# Patient Record
Sex: Male | Born: 1940 | Race: White | Hispanic: No | Marital: Married | Smoking: Former smoker
Health system: Southern US, Community
[De-identification: ages and names within clinical notes are randomized; demographics above are authoritative.]

## PROBLEM LIST (undated history)

## (undated) DIAGNOSIS — Z8719 Personal history of other diseases of the digestive system: Secondary | ICD-10-CM

## (undated) DIAGNOSIS — E039 Hypothyroidism, unspecified: Secondary | ICD-10-CM

## (undated) DIAGNOSIS — I2510 Atherosclerotic heart disease of native coronary artery without angina pectoris: Secondary | ICD-10-CM

## (undated) DIAGNOSIS — Z8739 Personal history of other diseases of the musculoskeletal system and connective tissue: Secondary | ICD-10-CM

## (undated) DIAGNOSIS — E119 Type 2 diabetes mellitus without complications: Secondary | ICD-10-CM

## (undated) HISTORY — PX: CATARACT EXTRACTION W/ INTRAOCULAR LENS  IMPLANT, BILATERAL: SHX1307

## (undated) HISTORY — PX: ANTERIOR CERVICAL DECOMP/DISCECTOMY FUSION: SHX1161

## (undated) HISTORY — PX: BACK SURGERY: SHX140

## (undated) HISTORY — DX: Type 2 diabetes mellitus without complications: E11.9

## (undated) HISTORY — PX: KNEE ARTHROSCOPY: SHX127

## (undated) HISTORY — PX: PATELLAR TENDON REPAIR: SHX737

## (undated) HISTORY — PX: TOOTH EXTRACTION: SUR596

## (undated) HISTORY — PX: HIATAL HERNIA REPAIR: SHX195

## (undated) HISTORY — PX: EXPLORATION POST OPERATIVE OPEN HEART: SHX5061

---

## 1994-08-04 HISTORY — PX: CARDIAC CATHETERIZATION: SHX172

## 1998-06-01 ENCOUNTER — Ambulatory Visit (HOSPITAL_COMMUNITY): Admission: RE | Admit: 1998-06-01 | Discharge: 1998-06-01 | Payer: Self-pay | Admitting: Gastroenterology

## 2004-08-04 DIAGNOSIS — I10 Essential (primary) hypertension: Secondary | ICD-10-CM | POA: Insufficient documentation

## 2004-08-04 DIAGNOSIS — M109 Gout, unspecified: Secondary | ICD-10-CM | POA: Insufficient documentation

## 2004-09-11 ENCOUNTER — Ambulatory Visit: Payer: Self-pay | Admitting: Unknown Physician Specialty

## 2006-12-15 DIAGNOSIS — E1169 Type 2 diabetes mellitus with other specified complication: Secondary | ICD-10-CM | POA: Insufficient documentation

## 2009-01-25 DIAGNOSIS — E1129 Type 2 diabetes mellitus with other diabetic kidney complication: Secondary | ICD-10-CM | POA: Insufficient documentation

## 2009-12-18 ENCOUNTER — Ambulatory Visit: Payer: Self-pay | Admitting: Unknown Physician Specialty

## 2013-03-04 LAB — CBC AND DIFFERENTIAL
HEMATOCRIT: 41 % (ref 41–53)
Hemoglobin: 14.2 g/dL (ref 13.5–17.5)
Platelets: 237 10*3/uL (ref 150–399)
WBC: 4.9 10*3/mL

## 2013-03-04 LAB — HEPATIC FUNCTION PANEL
ALT: 27 U/L (ref 10–40)
AST: 19 U/L (ref 14–40)

## 2013-10-25 LAB — LIPID PANEL
Cholesterol: 154 mg/dL (ref 0–200)
HDL: 28 mg/dL — AB (ref 35–70)
LDL CALC: 95 mg/dL
Triglycerides: 156 mg/dL (ref 40–160)

## 2014-03-10 LAB — BASIC METABOLIC PANEL
BUN: 16 mg/dL (ref 4–21)
Creatinine: 1.2 mg/dL (ref 0.6–1.3)
GLUCOSE: 155 mg/dL
Potassium: 4.7 mmol/L (ref 3.4–5.3)
Sodium: 137 mmol/L (ref 137–147)

## 2014-03-10 LAB — PSA: PSA: 0.5

## 2014-11-03 LAB — HM DIABETES EYE EXAM

## 2014-11-16 LAB — HEMOGLOBIN A1C: Hgb A1c MFr Bld: 9.5 % — AB (ref 4.0–6.0)

## 2015-01-08 ENCOUNTER — Encounter: Payer: Self-pay | Admitting: Family Medicine

## 2015-01-08 ENCOUNTER — Ambulatory Visit (INDEPENDENT_AMBULATORY_CARE_PROVIDER_SITE_OTHER): Payer: Medicare Other | Admitting: Family Medicine

## 2015-01-08 VITALS — BP 112/62 | HR 70 | Temp 97.6°F | Resp 16 | Wt 225.0 lb

## 2015-01-08 DIAGNOSIS — J069 Acute upper respiratory infection, unspecified: Secondary | ICD-10-CM

## 2015-01-08 MED ORDER — AMOXICILLIN-POT CLAVULANATE 875-125 MG PO TABS
1.0000 | ORAL_TABLET | Freq: Two times a day (BID) | ORAL | Status: DC
Start: 2015-01-08 — End: 2015-02-23

## 2015-01-08 NOTE — Progress Notes (Signed)
Subjective:     Patient ID: Stuart Henry, male   DOB: 1941/06/23, 74 y.o.   MRN: [id number]  Sinus Problem Pertinent negatives include no chills.   Chief Complaint  Patient presents with  . Sinus Problem    Sinus pain and pressure with productive cough x7days  States wife has been sick as well. Describes persistent clear sinus drainage with PND and accompanying cough. Has been using Zyrtec and Delsym for his sx.   Review of Systems  Constitutional: Negative for fever and chills.       Objective:   Physical Exam Ears: T.M's intact without inflammation Sinuses: non-tender Throat: no tonsillar enlargement or exudate Neck: no cervical adenopathy Lungs: clear    Assessment:     1. Upper respiratory infection  - amoxicillin-clavulanate (AUGMENTIN) 875-125 MG per tablet; Take 1 tablet by mouth 2 (two) times daily.  Dispense: 20 tablet; Refill: 0    Plan:        Start antibiotic if sinuses not improving or the next 2-3 days.

## 2015-01-08 NOTE — Patient Instructions (Signed)
Start Mucinex D If sinuses not improving over the next 2-3 days start Augmentin antibiotic provided

## 2015-02-07 ENCOUNTER — Other Ambulatory Visit: Payer: Self-pay | Admitting: Family Medicine

## 2015-02-09 ENCOUNTER — Ambulatory Visit: Payer: Medicare Other | Admitting: Anesthesiology

## 2015-02-09 ENCOUNTER — Ambulatory Visit
Admission: RE | Admit: 2015-02-09 | Discharge: 2015-02-09 | Disposition: A | Discharge status: Home / Self Care | Payer: Medicare Other | Source: Ambulatory Visit | Attending: Unknown Physician Specialty | Admitting: Unknown Physician Specialty

## 2015-02-09 ENCOUNTER — Encounter
Admission: RE | Disposition: A | Discharge status: Home / Self Care | Payer: Self-pay | Source: Ambulatory Visit | Attending: Unknown Physician Specialty

## 2015-02-09 ENCOUNTER — Encounter: Payer: Self-pay | Admitting: *Deleted

## 2015-02-09 DIAGNOSIS — I10 Essential (primary) hypertension: Secondary | ICD-10-CM | POA: Diagnosis not present

## 2015-02-09 DIAGNOSIS — Z86010 Personal history of colonic polyps: Secondary | ICD-10-CM | POA: Insufficient documentation

## 2015-02-09 DIAGNOSIS — Z87891 Personal history of nicotine dependence: Secondary | ICD-10-CM | POA: Insufficient documentation

## 2015-02-09 DIAGNOSIS — D123 Benign neoplasm of transverse colon: Secondary | ICD-10-CM | POA: Insufficient documentation

## 2015-02-09 DIAGNOSIS — D128 Benign neoplasm of rectum: Secondary | ICD-10-CM | POA: Insufficient documentation

## 2015-02-09 DIAGNOSIS — E119 Type 2 diabetes mellitus without complications: Secondary | ICD-10-CM | POA: Insufficient documentation

## 2015-02-09 DIAGNOSIS — Z79899 Other long term (current) drug therapy: Secondary | ICD-10-CM | POA: Diagnosis not present

## 2015-02-09 DIAGNOSIS — K640 First degree hemorrhoids: Secondary | ICD-10-CM | POA: Diagnosis not present

## 2015-02-09 HISTORY — PX: COLONOSCOPY WITH PROPOFOL: SHX5780

## 2015-02-09 LAB — GLUCOSE, CAPILLARY: GLUCOSE-CAPILLARY: 248 mg/dL — AB (ref 65–99)

## 2015-02-09 SURGERY — COLONOSCOPY WITH PROPOFOL
Anesthesia: Monitor Anesthesia Care

## 2015-02-09 MED ORDER — LIDOCAINE HCL (PF) 2 % IJ SOLN
INTRAMUSCULAR | Status: DC | PRN
  Administered 2015-02-09: 50 mg

## 2015-02-09 MED ORDER — PROPOFOL 10 MG/ML IV BOLUS
INTRAVENOUS | Status: DC | PRN
  Administered 2015-02-09: 50 mg via INTRAVENOUS

## 2015-02-09 MED ORDER — SODIUM CHLORIDE 0.9 % IV SOLN
INTRAVENOUS | Status: DC
  Administered 2015-02-09: 1000 mL via INTRAVENOUS

## 2015-02-09 MED ORDER — PIPERACILLIN-TAZOBACTAM 3.375 G IVPB 30 MIN
3.3750 g | Freq: Once | INTRAVENOUS | Status: AC
  Administered 2015-02-09: 3.375 g via INTRAVENOUS
  Filled 2015-02-09: qty 50

## 2015-02-09 MED ORDER — PROPOFOL INFUSION 10 MG/ML OPTIME
INTRAVENOUS | Status: DC | PRN
  Administered 2015-02-09: 150 ug/kg/min via INTRAVENOUS

## 2015-02-09 MED ORDER — EPHEDRINE SULFATE 50 MG/ML IJ SOLN
INTRAMUSCULAR | Status: DC | PRN
  Administered 2015-02-09: 5 mg via INTRAVENOUS
  Administered 2015-02-09 (×2): 10 mg via INTRAVENOUS

## 2015-02-09 MED ORDER — FENTANYL CITRATE (PF) 100 MCG/2ML IJ SOLN
INTRAMUSCULAR | Status: DC | PRN
  Administered 2015-02-09: 50 ug via INTRAVENOUS

## 2015-02-09 MED ORDER — PHENYLEPHRINE HCL 10 MG/ML IJ SOLN
INTRAMUSCULAR | Status: DC | PRN
  Administered 2015-02-09 (×3): 100 ug via INTRAVENOUS

## 2015-02-09 MED ORDER — SODIUM CHLORIDE 0.9 % IV SOLN: INTRAVENOUS | Status: DC

## 2015-02-09 NOTE — Op Note (Signed)
[facility] Patient Name: Stuart Henry Procedure Date: 02/09/2015 7:36 AM MRN: [id number] Account #: [id number] Date of Birth: 04-06-1941 Admit Type: Outpatient Age: 74 Room: [facility] ENDO ROOM 1 Gender: Male Note Status: Finalized Procedure:         Colonoscopy Indications:       Personal history of colonic polyps Providers:         Manya Silvas, MD Referring MD:      Kirstie Peri. Caryn Section, MD (Referring MD) Medicines:         Propofol per Anesthesia Complications:     No immediate complications. Procedure:         Pre-Anesthesia Assessment:                    - After reviewing the risks and benefits, the patient was                     deemed in satisfactory condition to undergo the procedure.                    After obtaining informed consent, the colonoscope was                     passed under direct vision. Throughout the procedure, the                     patient's blood pressure, pulse, and oxygen saturations                     were monitored continuously. The Colonoscope was                     introduced through the anus and advanced to the the cecum,                     identified by appendiceal orifice and ileocecal valve. The                     colonoscopy was performed without difficulty. The patient                     tolerated the procedure well. The quality of the bowel                     preparation was good. Findings:      Two sessile polyps were found in the rectum and in the transverse colon.       The polyps were diminutive in size. These polyps were removed with a       jumbo cold forceps. Resection and retrieval were complete.      Internal hemorrhoids were found during endoscopy. The hemorrhoids were       medium-sized and Grade I (internal hemorrhoids that do not prolapse).      A few medium-mouthed diverticula were found in the descending colon.      The exam was otherwise without abnormality. Impression:        -  Two diminutive polyps in the rectum and in the                     transverse colon. Resected and retrieved.                    - Internal hemorrhoids.                    -  Diverticulosis in the descending colon.                    - The examination was otherwise normal. Recommendation:    - Await pathology results. Manya Silvas, MD 02/09/2015 8:33:11 AM This report has been signed electronically. Number of Addenda: 0 Note Initiated On: 02/09/2015 7:36 AM Scope Withdrawal Time: 0 hours 13 minutes 37 seconds  Total Procedure Duration: 0 hours 20 minutes 43 seconds       [facility]

## 2015-02-09 NOTE — Transfer of Care (Signed)
Immediate Anesthesia Transfer of Care Note  Patient: Stuart Henry  Procedure(s) Performed: Procedure(s): COLONOSCOPY WITH PROPOFOL (N/A)  Patient Location: PACU  Anesthesia Type:General  Level of Consciousness: sedated  Airway & Oxygen Therapy: Patient Spontanous Breathing and Patient connected to nasal cannula oxygen  Post-op Assessment: Report given to RN and Post -op Vital signs reviewed and stable  Post vital signs: Reviewed and stable  Last Vitals:  Filed Vitals:   02/09/15 0835  BP: 87/59  Pulse: 56  Temp: 36.2 C  Resp: 14    Complications: No apparent anesthesia complications

## 2015-02-09 NOTE — Anesthesia Preprocedure Evaluation (Addendum)
Anesthesia Evaluation  Patient identified by MRN, date of birth, ID band Patient awake    Reviewed: Allergy & Precautions, NPO status , Patient's Chart, lab work & pertinent test results  Airway Mallampati: II  TM Distance: >3 FB Neck ROM: Limited    Dental  (+) Teeth Intact   Pulmonary former smoker,    Pulmonary exam normal       Cardiovascular Exercise Tolerance: Good hypertension, Pt. 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  Reproductive/Obstetrics                            Anesthesia Physical Anesthesia Plan  ASA: III  Anesthesia Plan: MAC   Post-op Pain Management:    Induction: Intravenous  Airway Management Planned: Nasal Cannula  Additional Equipment:   Intra-op Plan:   Post-operative Plan:   Informed Consent: I have reviewed the patients History and Physical, chart, labs and discussed the procedure including the risks, benefits and alternatives for the proposed anesthesia with the patient or authorized representative who has indicated his/her understanding and acceptance.     Plan Discussed with: CRNA  Anesthesia Plan Comments:         Anesthesia Quick Evaluation

## 2015-02-09 NOTE — H&P (Signed)
Primary Care Physician:  [name], MD Primary Gastroenterologist:  Dr. Vira Agar  Pre-Procedure History & Physical: HPI:  Stuart Henry is a 74 y.o. male is here for an colonoscopy.   Past Medical History  Diagnosis Date  . Diabetes mellitus without complication   . Hypertension     No past surgical history on file.  Prior to Admission medications   Medication Sig Start Date End Date Taking? Authorizing Provider  allopurinol (ZYLOPRIM) 100 MG tablet Take 100 mg by mouth daily.   Yes Historical Provider, MD  benazepril-hydrochlorthiazide (LOTENSIN HCT) 20-12.5 MG per tablet Take 1 tablet by mouth daily.   Yes Historical Provider, MD  glipiZIDE (GLUCOTROL) 5 MG tablet Take 5 mg by mouth daily before breakfast.   Yes Historical Provider, MD  sitaGLIPtin-metformin (JANUMET) 50-1000 MG per tablet Take 1 tablet by mouth 2 (two) times daily with a meal.   Yes Historical Provider, MD  amLODipine (NORVASC) 10 MG tablet TAKE 1 TABLET DAILY 02/07/15   Birdie Sons, MD  amoxicillin-clavulanate (AUGMENTIN) 875-125 MG per tablet Take 1 tablet by mouth 2 (two) times daily. 01/08/15   Carmon Ginsberg, PA    Allergies as of 12/19/2014  . (Not on File)    No family history on file.  History   Social History  . Marital Status: Married    Spouse Name: N/A  . Number of Children: N/A  . Years of Education: N/A   Occupational History  . Not on file.   Social History Main Topics  . Smoking status: Former Smoker    Types: Cigarettes    Quit date: 08/06/1972  . Smokeless tobacco: Never Used  . Alcohol Use: Not on file  . Drug Use: Not on file  . Sexual Activity: Not on file   Other Topics Concern  . Not on file   Social History Narrative    Review of Systems: See HPI, otherwise negative ROS  Physical Exam: BP 114/73 mmHg  Pulse 61  Temp(Src) 96.6 F (35.9 C) (Tympanic)  Resp 16  Ht 6\' 4"  (1.93 m)  Wt 94.802 kg (209 lb)  BMI 25.45 kg/m2  SpO2 100% General:   Alert,   pleasant and cooperative in NAD Head:  Normocephalic and atraumatic. Neck:  Supple; no masses or thyromegaly. Lungs:  Clear throughout to auscultation.    Heart:  Regular rate and rhythm. Abdomen:  Soft, nontender and nondistended. Normal bowel sounds, without guarding, and without rebound.   Neurologic:  Alert and  oriented x4;  grossly normal neurologically.  Impression/Plan: [name] is here for an colonoscopy to be performed for Personal history of colon polyps  Risks, benefits, limitations, and alternatives regarding  colonoscopy have been reviewed with the patient.  Questions have been answered.  All parties agreeable.   [name], MD  02/09/2015, 7:56 AM   Primary Care Physician:  [name], MD Primary Gastroenterologist:  Dr. Vira Agar  Pre-Procedure History & Physical: HPI:  Stuart Henry is a 74 y.o. male is here for an colonoscopy.   Past Medical History  Diagnosis Date  . Diabetes mellitus without complication   . Hypertension     No past surgical history on file.  Prior to Admission medications   Medication Sig Start Date End Date Taking? Authorizing Provider  allopurinol (ZYLOPRIM) 100 MG tablet Take 100 mg by mouth daily.   Yes Historical Provider, MD  benazepril-hydrochlorthiazide (LOTENSIN HCT) 20-12.5 MG per tablet Take 1 tablet by mouth daily.  Yes Historical Provider, MD  glipiZIDE (GLUCOTROL) 5 MG tablet Take 5 mg by mouth daily before breakfast.   Yes Historical Provider, MD  sitaGLIPtin-metformin (JANUMET) 50-1000 MG per tablet Take 1 tablet by mouth 2 (two) times daily with a meal.   Yes Historical Provider, MD  amLODipine (NORVASC) 10 MG tablet TAKE 1 TABLET DAILY 02/07/15   Birdie Sons, MD  amoxicillin-clavulanate (AUGMENTIN) 875-125 MG per tablet Take 1 tablet by mouth 2 (two) times daily. 01/08/15   Carmon Ginsberg, PA    Allergies as of 12/19/2014  . (Not on File)    No family history on file.  History   Social History  .  Marital Status: Married    Spouse Name: N/A  . Number of Children: N/A  . Years of Education: N/A   Occupational History  . Not on file.   Social History Main Topics  . Smoking status: Former Smoker    Types: Cigarettes    Quit date: 08/06/1972  . Smokeless tobacco: Never Used  . Alcohol Use: Not on file  . Drug Use: Not on file  . Sexual Activity: Not on file   Other Topics Concern  . Not on file   Social History Narrative    Review of Systems: See HPI, otherwise negative ROS  Physical Exam: BP 114/73 mmHg  Pulse 61  Temp(Src) 96.6 F (35.9 C) (Tympanic)  Resp 16  Ht 6\' 4"  (1.93 m)  Wt 94.802 kg (209 lb)  BMI 25.45 kg/m2  SpO2 100% General:   Alert,  pleasant and cooperative in NAD Head:  Normocephalic and atraumatic. Neck:  Supple; no masses or thyromegaly. Lungs:  Clear throughout to auscultation.    Heart:  Regular rate and rhythm. Abdomen:  Soft, nontender and nondistended. Normal bowel sounds, without guarding, and without rebound.   Neurologic:  Alert and  oriented x4;  grossly normal neurologically.  Impression/Plan: Stuart Henry is here for an colonoscopy to be performed for personal history of colon polyps  Risks, benefits, limitations, and alternatives regarding  colonoscopy have been reviewed with the patient.  Questions have been answered.  All parties agreeable.   [name], MD  02/09/2015, 7:56 AM

## 2015-02-09 NOTE — Anesthesia Postprocedure Evaluation (Signed)
  Anesthesia Post-op Note  Patient: Stuart Henry  Procedure(s) Performed: Procedure(s): COLONOSCOPY WITH PROPOFOL (N/A)  Anesthesia type:MAC, General  Patient location: PACU  Post pain: Pain level controlled  Post assessment: Post-op Vital signs reviewed, Patient's Cardiovascular Status Stable, Respiratory Function Stable, Patent Airway and No signs of Nausea or vomiting  Post vital signs: Reviewed and stable  Last Vitals:  Filed Vitals:   02/09/15 0835  BP: 87/59  Pulse: 56  Temp: 36.2 C  Resp: 14    Level of consciousness: awake, alert  and patient cooperative  Complications: No apparent anesthesia complications

## 2015-02-12 ENCOUNTER — Encounter: Payer: Self-pay | Admitting: Unknown Physician Specialty

## 2015-02-12 LAB — SURGICAL PATHOLOGY

## 2015-02-14 ENCOUNTER — Other Ambulatory Visit: Payer: Self-pay | Admitting: Family Medicine

## 2015-02-22 DIAGNOSIS — N400 Enlarged prostate without lower urinary tract symptoms: Secondary | ICD-10-CM | POA: Insufficient documentation

## 2015-02-22 DIAGNOSIS — L309 Dermatitis, unspecified: Secondary | ICD-10-CM | POA: Insufficient documentation

## 2015-02-22 DIAGNOSIS — L989 Disorder of the skin and subcutaneous tissue, unspecified: Secondary | ICD-10-CM | POA: Insufficient documentation

## 2015-02-22 DIAGNOSIS — J309 Allergic rhinitis, unspecified: Secondary | ICD-10-CM | POA: Insufficient documentation

## 2015-02-22 DIAGNOSIS — Z86010 Personal history of colonic polyps: Secondary | ICD-10-CM | POA: Insufficient documentation

## 2015-02-23 ENCOUNTER — Encounter: Payer: Self-pay | Admitting: Family Medicine

## 2015-02-23 ENCOUNTER — Ambulatory Visit (INDEPENDENT_AMBULATORY_CARE_PROVIDER_SITE_OTHER): Payer: Medicare Other | Admitting: Family Medicine

## 2015-02-23 VITALS — BP 120/62 | HR 72 | Temp 98.3°F | Resp 16 | Ht 76.5 in | Wt 224.0 lb

## 2015-02-23 DIAGNOSIS — G629 Polyneuropathy, unspecified: Secondary | ICD-10-CM

## 2015-02-23 DIAGNOSIS — E1165 Type 2 diabetes mellitus with hyperglycemia: Secondary | ICD-10-CM

## 2015-02-23 DIAGNOSIS — IMO0002 Reserved for concepts with insufficient information to code with codable children: Secondary | ICD-10-CM

## 2015-02-23 DIAGNOSIS — Z0000 Encounter for general adult medical examination without abnormal findings: Secondary | ICD-10-CM | POA: Diagnosis not present

## 2015-02-23 DIAGNOSIS — E785 Hyperlipidemia, unspecified: Secondary | ICD-10-CM

## 2015-02-23 DIAGNOSIS — E1140 Type 2 diabetes mellitus with diabetic neuropathy, unspecified: Secondary | ICD-10-CM

## 2015-02-23 DIAGNOSIS — Z125 Encounter for screening for malignant neoplasm of prostate: Secondary | ICD-10-CM

## 2015-02-23 DIAGNOSIS — I10 Essential (primary) hypertension: Secondary | ICD-10-CM

## 2015-02-23 MED ORDER — GABAPENTIN 300 MG PO CAPS: 300.0000 mg | ORAL_CAPSULE | Freq: Every day | ORAL | Status: DC

## 2015-02-23 MED ORDER — REPAGLINIDE 1 MG PO TABS: ORAL_TABLET | ORAL | Status: DC

## 2015-02-23 NOTE — Progress Notes (Signed)
Patient: Stuart Henry, Male    DOB: 1941/05/09, 74 y.o.   MRN: [id number] Visit Date: 02/23/2015  Today's Provider: [name], MD   Chief Complaint  Patient presents with  . Medicare Wellness  . Diabetes    follow up  . Hyperlipidemia    follow up  . Hypertension    follow up   Subjective:    Annual wellness visit Stuart Henry is a 74 y.o. male who presents today for his Subsequent Annual Wellness Visit. He feels fairly well. He reports exercising twice a week. He reports he is sleeping fairly well.  -----------------------------------------------------------  Diabetes Mellitus Type II, Follow-up:   Lab Results  Component Value Date   HGBA1C 9.5* 11/16/2014   Last seen for diabetes 3 months ago.  Management changes included none. He reports good compliance with treatment. He is not having side effects.  Current symptoms include polyuria and have been stable. Home blood sugar records: fasting range: 200 After last visit he stopped lovastatin to see if this help sugars. They originally dropped by 50-100 points, but started going back when he developed sinus infection, then had colonoscopy, and has not been following diet very well every since.  Episodes of hypoglycemia? no   Most Recent Eye Exam: 1 month ago Weight trend: decreasing steadily Prior visit with dietician: no Current diet: in general, an "unhealthy" diet Current exercise: bicycling  ------------------------------------------------------------------------   Hypertension, follow-up:  BP Readings from Last 3 Encounters:  02/23/15 120/62  02/09/15 112/79  01/08/15 112/62    He was last seen for hypertension 3 months ago.  BP at that visit was 118/70. Management changes since that visit include  none.He reports good compliance with treatment. He is not having side effects.  He is exercising. He is not adherent to low salt diet.   Outside blood pressures are not being checked. He  is experiencing none.  Patient denies chest pain, chest pressure/discomfort, claudication, dyspnea, exertional chest pressure/discomfort, fatigue, irregular heart beat, lower extremity edema, near-syncope, orthopnea, palpitations, paroxysmal nocturnal dyspnea, syncope and tachypnea.   Cardiovascular risk factors include advanced age (older than 67 for men, 52 for women), diabetes mellitus, dyslipidemia, hypertension and male gender.  Use of agents associated with hypertension:   ------------------------------------------------------------------------    Lipid/Cholesterol, Follow-up:   Last seen for this 3 months ago.  Management changes since that visit include  Putting Lovastatin on hold.  Last Lipid Panel:    Component Value Date/Time   CHOL 154 10/25/2013   TRIG 156 10/25/2013   HDL 28* 10/25/2013   [place] 95 10/25/2013    He reports good compliance with treatment. He is not having side effects.   Wt Readings from Last 3 Encounters:  02/23/15 224 lb (101.606 kg)  02/09/15 209 lb (94.802 kg)  01/08/15 225 lb (102.059 kg)    ------------------------------------------------------------------------  Neuropathy: He states his feet stay numb when he is wearing shoes. And start to burn after he take off his shoes and lay down at night. He states gabapentin helped with pain moderately if he took three of them at night.    Review of Systems  Constitutional: Negative for fever, chills, appetite change and fatigue.  HENT: Negative for congestion, ear pain, hearing loss, nosebleeds and trouble swallowing.   Eyes: Negative for pain and visual disturbance.  Respiratory: Negative for cough, chest tightness and shortness of breath.   Cardiovascular: Negative for chest pain, palpitations and leg swelling.  Gastrointestinal: Negative for  nausea, vomiting, abdominal pain, diarrhea, constipation and blood in stool.  Endocrine: Negative for polydipsia, polyphagia and polyuria.    Genitourinary: Positive for frequency. Negative for dysuria and flank pain.  Musculoskeletal: Negative for myalgias, back pain, joint swelling, arthralgias and neck stiffness.  Skin: Negative for color change, rash and wound.  Neurological: Positive for numbness. Negative for dizziness, tremors, seizures, speech difficulty, weakness, light-headedness and headaches.  Psychiatric/Behavioral: Negative for behavioral problems, confusion, sleep disturbance, dysphoric mood and decreased concentration. The patient is not nervous/anxious.   All other systems reviewed and are negative.   History   Social History  . Marital Status: Married    Spouse Name: N/A  . Number of Children: N/A  . Years of Education: N/A   Occupational History  . Retired    Social History Main Topics  . Smoking status: Former Smoker -- 1.00 packs/day for 5 years    Types: Cigarettes    Quit date: 08/06/1972  . Smokeless tobacco: Never Used  . Alcohol Use: No  . Drug Use: No  . Sexual Activity: Not on file   Other Topics Concern  . Not on file   Social History Narrative    Patient Active Problem List   Diagnosis Date Noted  . Allergic rhinitis 02/22/2015  . BPH (benign prostatic hyperplasia) 02/22/2015  . Dermatitis 02/22/2015  . H/O adenomatous polyp of colon 02/22/2015  . Skin lesion 02/22/2015  . Type II diabetes mellitus with renal manifestations, uncontrolled 01/25/2009  . Polyneuropathy 07/18/2008  . Hyperlipidemia 12/15/2006  . Diabetes mellitus type 2, uncontrolled 08/04/2004  . Essential (primary) hypertension 08/04/2004  . 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 Colonoscopy with propofol N/A 02/09/2015    Procedure: COLONOSCOPY WITH PROPOFOL;  Surgeon: Manya Silvas, MD;  Location[facility] ENDOSCOPY;  Service: Endoscopy;  Laterality: N/A;  . Total knee arthroplasty  2010  . Knee surgery  2010    to correct a severed patella tendon    His  family history includes Cancer in his father; Dementia in his mother; Diabetes in his brother, mother, and sister; Heart disease in his other.    Previous Medications   ALLOPURINOL (ZYLOPRIM) 100 MG TABLET    Take 100 mg by mouth daily.   AMLODIPINE (NORVASC) 10 MG TABLET    TAKE 1 TABLET DAILY   AMOXICILLIN-CLAVULANATE (AUGMENTIN) 875-125 MG PER TABLET    Take 1 tablet by mouth 2 (two) times daily.   BENAZEPRIL-HYDROCHLORTHIAZIDE (LOTENSIN HCT) 20-12.5 MG PER TABLET    Take 1 tablet by mouth daily.   GLIPIZIDE (GLUCOTROL) 5 MG TABLET    Take 5 mg by mouth daily before breakfast.   GLUCOSE BLOOD (ACCU-CHEK AVIVA PLUS) TEST STRIP    1 strip daily.   LOVASTATIN (MEVACOR) 40 MG TABLET    ON HOLD   SITAGLIPTIN-METFORMIN (JANUMET) 50-1000 MG PER TABLET    Take 1 tablet by mouth 2 (two) times daily with a meal.    Patient Care Team: Birdie Sons, MD as PCP - General (Family Medicine) Sharyne Peach, MD as Consulting Physician (Ophthalmology)     Objective:   Vitals: BP 120/62 mmHg  Pulse 72  Temp(Src) 98.3 F (36.8 C)  Resp 16  Ht 6' 4.5" (1.943 m)  Wt 224 lb (101.606 kg)  BMI 26.91 kg/m2  SpO2 97%  Physical Exam   General Appearance:    Alert, cooperative, no distress  Eyes:    PERRL, conjunctiva/corneas clear, EOM's  intact       Lungs:     Clear to auscultation bilaterally, respirations unlabored  Heart:    Regular rate and rhythm  Neurologic:   Awake, alert, oriented x 3. No apparent focal neurological           defect.         Activities of Daily Living In your present state of health, do you have any difficulty performing the following activities: 02/23/2015  Hearing? N  Vision? Y  Difficulty concentrating or making decisions? N  Walking or climbing stairs? N  Dressing or bathing? N  Doing errands, shopping? N    Fall Risk Assessment Fall Risk  02/23/2015  Falls in the past year? No     Depression Screen PHQ 2/9 Scores 02/23/2015  PHQ - 2 Score 0     Cognitive Testing - 6-CIT  Correct? Score   What year is it? yes 0 0 or 4  What month is it? yes 0 0 or 3  Memorize:    Pia Mau,  44,  [address],  [place],      What time is it? (within 1 hour) yes 0 0 or 3  Count backwards from 20 yes 0 0, 2, or 4  Name the months of the year yes 0 0, 2, or 4  Repeat name & address above yes 0 0, 2, 4, 6, 8, or 10       TOTAL SCORE  0/28   Interpretation:  Normal  Normal (0-7) Abnormal (8-28)     Audit-C Alcohol Use Screening  Question Answer Points  How often do you have alcoholic drink? never 0  How many drinks do you typically consume in a day? 0 0  How oftey will you drink 6 or more in a total? never 0  Total Score:  0   A score of 3 or more in women, and 4 or more in men indicates increased risk for alcohol abuse, EXCEPT if all of the points are from question 1.   Assessment & Plan:     Annual Wellness Visit  Reviewed patient's Family Medical History Reviewed and updated list of patient's medical providers Assessment of cognitive impairment was done Assessed patient's functional ability Established a written schedule for health screening [place] Completed and Reviewed  Exercise Activities and Dietary recommendations Goals    None      Immunization History  Administered Date(s) Administered  . Pneumococcal Conjugate-13 03/10/2014  . Pneumococcal Polysaccharide-23 08/31/2006, 03/02/2012  . Tdap 03/10/2014  . Zoster 06/16/2011    Health Maintenance  Topic Date Due  . FOOT EXAM  01/11/1953  . OPHTHALMOLOGY EXAM  01/11/1953  . URINE MICROALBUMIN  01/11/1953  . INFLUENZA VACCINE  03/05/2015  . HEMOGLOBIN A1C  05/18/2015  . COLONOSCOPY  02/09/2020  . TETANUS/TDAP  03/10/2024  . ZOSTAVAX  Completed  . PNA vac Low Risk Adult  Completed      Discussed health benefits of physical activity, and encouraged him to engage in regular exercise appropriate for his age and condition.     ------------------------------------------------------------------------------------------------------------  1. Annual physical exam   2. Prostate cancer screening - PSA  3. Uncontrolled type 2 diabetes with neuropathy Initially improved after stopping statin, but has had a number of events since that and has not been able to stick to diabetic diet very well. He would like something to take just as needed if his sugar is running high before meals.   -  Hemoglobin A1c - Renal function panel - repaglinide (PRANDIN) 1 MG tablet; Before meals as needed up to three a day.  Dispense: 30 tablet; Refill: 2  4. Hyperlipidemia Currently off statin due to affects on blood sugar. He is reluctant to start back on statin. Will see how lipids look and consider different statin.  - Lipid panel  5. Polyneuropathy Previously did well on gabapentin which we will restart  - gabapentin (NEURONTIN) 300 MG capsule; Take 1 capsule (300 mg total) by mouth at bedtime.  Dispense: 90 capsule; Refill: 3  6. Essential (primary) hypertension well controlled Continue current medications.   - EKG 12-Lead

## 2015-02-27 LAB — RENAL FUNCTION PANEL
ALBUMIN: 4.7 g/dL (ref 3.5–4.8)
BUN/Creatinine Ratio: 16 (ref 10–22)
BUN: 17 mg/dL (ref 8–27)
CALCIUM: 10 mg/dL (ref 8.6–10.2)
CO2: 24 mmol/L (ref 18–29)
CREATININE: 1.07 mg/dL (ref 0.76–1.27)
Chloride: 95 mmol/L — ABNORMAL LOW (ref 97–108)
GFR, EST AFRICAN AMERICAN: 80 mL/min/{1.73_m2} (ref >59)
GFR, EST NON AFRICAN AMERICAN: 69 mL/min/{1.73_m2} (ref >59)
GLUCOSE: 264 mg/dL — AB (ref 65–99)
Phosphorus: 3.2 mg/dL (ref 2.5–4.5)
Potassium: 4.7 mmol/L (ref 3.5–5.2)
Sodium: 136 mmol/L (ref 134–144)

## 2015-02-27 LAB — LIPID PANEL
CHOL/HDL RATIO: 5.8 ratio — AB (ref 0.0–5.0)
Cholesterol, Total: 161 mg/dL (ref 100–199)
HDL: 28 mg/dL — AB (ref >39)
LDL CALC: 83 mg/dL (ref 0–99)
Triglycerides: 250 mg/dL — ABNORMAL HIGH (ref 0–149)
VLDL CHOLESTEROL CAL: 50 mg/dL — AB (ref 5–40)

## 2015-02-27 LAB — HEMOGLOBIN A1C
Est. average glucose Bld gHb Est-mCnc: 214 mg/dL
Hgb A1c MFr Bld: 9.1 % — ABNORMAL HIGH (ref 4.8–5.6)

## 2015-02-27 LAB — PSA: Prostate Specific Ag, Serum: 0.4 ng/mL (ref 0.0–4.0)

## 2015-03-01 ENCOUNTER — Other Ambulatory Visit: Payer: Self-pay | Admitting: Family Medicine

## 2015-04-23 ENCOUNTER — Other Ambulatory Visit: Payer: Self-pay | Admitting: Family Medicine

## 2015-05-28 ENCOUNTER — Other Ambulatory Visit: Payer: Self-pay | Admitting: Family Medicine

## 2015-06-12 ENCOUNTER — Encounter: Payer: Self-pay | Admitting: Family Medicine

## 2015-06-12 ENCOUNTER — Ambulatory Visit (INDEPENDENT_AMBULATORY_CARE_PROVIDER_SITE_OTHER): Payer: Medicare Other | Admitting: Family Medicine

## 2015-06-12 VITALS — BP 128/72 | HR 63 | Temp 98.0°F | Resp 16 | Wt 226.0 lb

## 2015-06-12 DIAGNOSIS — G629 Polyneuropathy, unspecified: Secondary | ICD-10-CM | POA: Diagnosis not present

## 2015-06-12 DIAGNOSIS — E1165 Type 2 diabetes mellitus with hyperglycemia: Secondary | ICD-10-CM

## 2015-06-12 DIAGNOSIS — IMO0001 Reserved for inherently not codable concepts without codable children: Secondary | ICD-10-CM

## 2015-06-12 LAB — POCT GLYCOSYLATED HEMOGLOBIN (HGB A1C)
ESTIMATED AVERAGE GLUCOSE: 189
Hemoglobin A1C: 8.2

## 2015-06-12 NOTE — Progress Notes (Signed)
Patient: Stuart Henry Male    DOB: 08/22/1940   74 y.o.   MRN: [id number] Visit Date: 06/12/2015  Today's Provider: [name], MD   Chief Complaint  Patient presents with  . Diabetes    follow up  . Hypertension    follow up  . Peripheral Neuropathy    follow up   Subjective:    HPI  Diabetes Mellitus Type II, Follow-up:   Lab Results  Component Value Date   HGBA1C 9.1* 02/26/2015   HGBA1C 9.5* 11/16/2014    Last seen for diabetes 4 months ago.  Management since then includes starting Prandin  To take as needed if blood sugars are running high before meals. He has been taking it every day before dinner.  He reports good compliance with treatment. He is not having side effects. Patient is concerned about taking Prandin because he has heard that this medication is no longer use and his pharmacy doesn't keep it in stock. Patient states the medication has helped to lower his blood sugars.  Current symptoms include none and have been stable. Home blood sugar records: fasting range: 156-239  Episodes of hypoglycemia? no   Current Insulin Regimen: none Most Recent Eye Exam: <1 year Weight trend: stable Prior visit with dietician: no Current diet: in general, a "healthy" diet   Current exercise: no regular exercise and golfing twice a week, walking, tredmill, strength training  Pertinent Labs:    Component Value Date/Time   CHOL 161 02/26/2015 0836   CHOL 154 10/25/2013   TRIG 250* 02/26/2015 0836   CHOLHDL 5.8* 02/26/2015 0836   CREATININE 1.07 02/26/2015 0836   CREATININE 1.2 03/10/2014    Wt Readings from Last 3 Encounters:  06/12/15 226 lb (102.513 kg)  02/23/15 224 lb (101.606 kg)  02/09/15 209 lb (94.802 kg)    ------------------------------------------------------------------------   Hypertension, follow-up:  BP Readings from Last 3 Encounters:  06/12/15 128/72  02/23/15 120/62  02/09/15 112/79    He was last seen for hypertension  4 months ago.  BP at that visit was  120/62. Management since that visit includes no changes. He reports good compliance with treatment. He is not having side effects.  He is exercising. He is adherent to low salt diet.   Outside blood pressures are  Not being checked. He is experiencing none.  Patient denies chest pain, chest pressure/discomfort, claudication, dyspnea, exertional chest pressure/discomfort, fatigue, irregular heart beat, lower extremity edema, near-syncope, orthopnea, palpitations, paroxysmal nocturnal dyspnea, syncope and tachypnea.   Cardiovascular risk factors include advanced age (older than 82 for men, 58 for women), diabetes mellitus, hypertension and male gender.  Use of agents associated with hypertension: none.     Weight trend: stable Wt Readings from Last 3 Encounters:  06/12/15 226 lb (102.513 kg)  02/23/15 224 lb (101.606 kg)  02/09/15 209 lb (94.802 kg)    Current diet: in general, a "healthy" diet    ------------------------------------------------------------------------   Neuropathy Follow up: Last office visit was 4 months ago. Managememt during that visit includes restarting Gabapentin. Today patient comes in stating he has not had any fain in his feet since taking the Gabapentin. Patient has not had had any of theses side effects but is concerned.     No Known Allergies Previous Medications   ACCU-CHEK AVIVA PLUS TEST STRIP    Check blood sugar once  daily   ALLOPURINOL (ZYLOPRIM) 100 MG TABLET    Take 100 mg by mouth  daily.   AMLODIPINE (NORVASC) 10 MG TABLET    TAKE 1 TABLET DAILY   BENAZEPRIL-HYDROCHLORTHIAZIDE (LOTENSIN HCT) 20-12.5 MG PER TABLET    Take 1 tablet by mouth daily.   GABAPENTIN (NEURONTIN) 300 MG CAPSULE    Take 1 capsule (300 mg total) by mouth at bedtime.   GLIPIZIDE (GLUCOTROL) 5 MG TABLET    Take 5 mg by mouth daily before breakfast.   JANUMET 50-1000 MG PER TABLET    TAKE 1 TABLET TWICE A DAY   LOVASTATIN (MEVACOR) 40  MG TABLET    TAKE 1 TABLET EVERY EVENING   REPAGLINIDE (PRANDIN) 1 MG TABLET    TAKE ONE TABLET BY MOUTH UP TO THREE TIMES DAILY BEFORE MEALS FOR BLOOD SUGAR    Review of Systems  Constitutional: Negative for fever, chills and appetite change.  Respiratory: Negative for chest tightness, shortness of breath and wheezing.   Cardiovascular: Negative for chest pain and palpitations.  Gastrointestinal: Negative for nausea, vomiting and abdominal pain.    Social History  Substance Use Topics  . Smoking status: Former Smoker -- 1.00 packs/day for 5 years    Types: Cigarettes    Quit date: 08/06/1972  . Smokeless tobacco: Never Used  . Alcohol Use: No   Objective:   BP 128/72 mmHg  Pulse 63  Temp(Src) 98 F (36.7 C) (Oral)  Resp 16  Wt 226 lb (102.513 kg)  SpO2 97%  Physical Exam   General Appearance:    Alert, cooperative, no distress  Eyes:    PERRL, conjunctiva/corneas clear, EOM's intact       Lungs:     Clear to auscultation bilaterally, respirations unlabored  Heart:    Regular rate and rhythm  Neurologic:   Awake, alert, oriented x 3. No apparent focal neurological           defect.        Results for orders placed or performed in visit on 06/12/15  POCT HgB A1C  Result Value Ref Range   Hemoglobin A1C 8.2    Est. average glucose Bld gHb Est-mCnc 189        Assessment & Plan:     1. Uncontrolled type 2 diabetes mellitus without complication, without long-term current use of insulin (HCC) Improving, but not to goal. Discussed options of increasing glipizide, adding another agent, and/or lifestyle changes. He states he has not been exercising much and will try to increase physical activity. He is also to start checking sugar after lunch and dinner to see how post prandials are doing.  - POCT HgB A1C  2. Polyneuropathy ([place]) Much better since restarting gabapentin  Still off of lovastatin since he felt this caused significant increase in blood sugars. Consider  pravastatin, which has very little effect on blood sugars, once his A1c is near goal.   Follow up 3-4 months.        [name], MD  [facility]

## 2015-08-07 ENCOUNTER — Other Ambulatory Visit: Payer: Self-pay | Admitting: Family Medicine

## 2015-08-15 ENCOUNTER — Other Ambulatory Visit: Payer: Self-pay | Admitting: Family Medicine

## 2015-08-30 ENCOUNTER — Ambulatory Visit (INDEPENDENT_AMBULATORY_CARE_PROVIDER_SITE_OTHER): Payer: 59 | Admitting: Family Medicine

## 2015-08-30 ENCOUNTER — Encounter: Payer: Self-pay | Admitting: Family Medicine

## 2015-08-30 VITALS — BP 112/72 | HR 72 | Temp 98.4°F | Resp 16 | Wt 223.0 lb

## 2015-08-30 DIAGNOSIS — J069 Acute upper respiratory infection, unspecified: Secondary | ICD-10-CM

## 2015-08-30 DIAGNOSIS — E1165 Type 2 diabetes mellitus with hyperglycemia: Secondary | ICD-10-CM

## 2015-08-30 DIAGNOSIS — IMO0001 Reserved for inherently not codable concepts without codable children: Secondary | ICD-10-CM

## 2015-08-30 LAB — POCT GLYCOSYLATED HEMOGLOBIN (HGB A1C)
ESTIMATED AVERAGE GLUCOSE: 217
Hemoglobin A1C: 9.2

## 2015-08-30 MED ORDER — PIOGLITAZONE HCL 30 MG PO TABS: 30.0000 mg | ORAL_TABLET | Freq: Every day | ORAL | Status: DC

## 2015-08-30 MED ORDER — AMOXICILLIN 500 MG PO CAPS: 1000.0000 mg | ORAL_CAPSULE | Freq: Two times a day (BID) | ORAL | Status: AC

## 2015-08-30 MED ORDER — METFORMIN HCL 1000 MG PO TABS: 1000.0000 mg | ORAL_TABLET | Freq: Two times a day (BID) | ORAL | Status: DC

## 2015-08-30 NOTE — Progress Notes (Signed)
Patient ID: Stuart Henry, male   DOB: [birth year], 75 y.o.   MRN: [id number]       Patient: Stuart Henry Male    DOB: 26-Feb-1941   75 y.o.   MRN: [id number] Visit Date: 08/30/2015  Today's Provider: [name], MD   Chief Complaint  Patient presents with  . URI    X 2 days.    Subjective:    URI  This is a new problem. The current episode started in the past 7 days. The problem has been gradually worsening. There has been no fever. Associated symptoms include congestion, coughing, headaches, rhinorrhea and a sore throat. Pertinent negatives include no ear pain or wheezing. He has tried decongestant for the symptoms. The treatment provided mild relief.  Patient also mentions that he is having eye surgery on Monday and wants to be well before then.    Diabetes Mellitus Type II, Follow-up:   Lab Results  Component Value Date   HGBA1C 8.2 06/12/2015   HGBA1C 9.1* 02/26/2015   HGBA1C 9.5* 11/16/2014    Last seen for diabetes 2 months ago.  Management since then includes getting stricter with diet. Has cut out most starches, but does eat some white bread and fried foods. Marland Kitchen He reports good compliance with treatment. He is not having side effects.   Home blood sugar records: fastings labile from upper 100s to low 200s.   Episodes of hypoglycemia? no   Weight trend: stable  Current diet: in general, a "healthy" diet   Current exercise: walking 3-4 days a week for 30-45 minutes.   Pertinent Labs:    Component Value Date/Time   CHOL 161 02/26/2015 0836   CHOL 154 10/25/2013   TRIG 250* 02/26/2015 0836   HDL 28* 02/26/2015 0836   HDL 28* 10/25/2013   LDLCALC 83 02/26/2015 0836   LDLCALC 95 10/25/2013   CREATININE 1.07 02/26/2015 0836   CREATININE 1.2 03/10/2014    Wt Readings from Last 3 Encounters:  08/30/15 223 lb (101.152 kg)  06/12/15 226 lb (102.513 kg)  02/23/15 224 lb (101.606 kg)     ------------------------------------------------------------------------        No Known Allergies Previous Medications   ACCU-CHEK AVIVA PLUS TEST STRIP    Check blood sugar once  daily   ALLOPURINOL (ZYLOPRIM) 100 MG TABLET    TAKE 1 TABLET DAILY   AMLODIPINE (NORVASC) 10 MG TABLET    TAKE 1 TABLET DAILY   BENAZEPRIL-HYDROCHLORTHIAZIDE (LOTENSIN HCT) 20-12.5 MG PER TABLET    Take 1 tablet by mouth daily.   GABAPENTIN (NEURONTIN) 300 MG CAPSULE    Take 1 capsule (300 mg total) by mouth at bedtime.   GLIPIZIDE (GLUCOTROL) 5 MG TABLET    TAKE 1 TABLET DAILY   JANUMET 50-1000 MG PER TABLET    TAKE 1 TABLET TWICE A DAY   LOVASTATIN (MEVACOR) 40 MG TABLET  (NOT TAKING)    TAKE 1 TABLET EVERY EVENING   REPAGLINIDE (PRANDIN) 1 MG TABLET    TAKE ONE TABLET BY MOUTH UP TO 3 TIMES DAILY BEFORE MEALS FOR BLOOD SUGAR    Review of Systems  Constitutional: Negative.   HENT: Positive for congestion, postnasal drip, rhinorrhea and sore throat. Negative for ear discharge and ear pain.   Respiratory: Positive for cough. Negative for choking, chest tightness, wheezing and stridor.   Cardiovascular: Negative.   Neurological: Positive for headaches.    Social History  Substance Use Topics  . Smoking status: Former Smoker -- 1.00  packs/day for 5 years    Types: Cigarettes    Quit date: 08/06/1972  . Smokeless tobacco: Never Used  . Alcohol Use: No   Objective:   BP 112/72 mmHg  Pulse 72  Temp(Src) 98.4 F (36.9 C)  Resp 16  Wt 223 lb (101.152 kg)  SpO2 100%  Physical Exam  General Appearance:    Alert, cooperative, no distress  HENT:   bilateral TM normal without fluid or infection, neck without nodes, throat normal without erythema or exudate, sinuses nontender and nasal mucosa pale and congested  Eyes:    PERRL, conjunctiva/corneas clear, EOM's intact       Lungs:     Clear to auscultation bilaterally, respirations unlabored  Heart:    Regular rate and rhythm  Neurologic:    Awake, alert, oriented x 3. No apparent focal neurological           defect.         Results for orders placed or performed in visit on 06/12/15  POCT HgB A1C  Result Value Ref Range   Hemoglobin A1C 8.2    Est. average glucose Bld gHb Est-mCnc 189   HM DIABETES EYE EXAM  Result Value Ref Range   HM Diabetic Eye Exam No Retinopathy No Retinopathy       Assessment & Plan:      1. Upper respiratory infection Counseled that current symptoms are viral in nature, however he is scheduled for eye surgery and concerned he may progress to bacterial sinus infection that could postpone surgery. Will cover with amoxicillin to hopefull prevent that course.   2. Uncontrolled type 2 diabetes mellitus without complication, without long-term current use of insulin ([place]) Spent 25 minutes with patient counseling regarding control of blood sugars, goals, and options for more aggressive treatments. He really want to get off Janumet due to expense. Will add pioglitazone now and change from Janumet to metformin 1000BID when current bottle of Janumet runs out. Follow up in about 2 months to review blood sugars with medication changes.  - POCT glycosylated hemoglobin (Hb A1C) - metFORMIN (GLUCOPHAGE) 1000 MG tablet; Take 1 tablet (1,000 mg total) by mouth 2 (two) times daily with a meal.  Dispense: 180 tablet; Refill: 3  Addressed chronic and acute medical problems today requiring extensive time in counseling and coordination care.  Extensive counseled on methods of improving diabetes control. Over half of this 45 minute visit were spent in counseling and coordinating care of multiple medical problems.       [name], MD  [facility]

## 2015-09-11 ENCOUNTER — Ambulatory Visit: Payer: Medicare Other | Admitting: Family Medicine

## 2015-09-22 ENCOUNTER — Other Ambulatory Visit: Payer: Self-pay | Admitting: Family Medicine

## 2015-11-30 ENCOUNTER — Encounter: Payer: Self-pay | Admitting: Family Medicine

## 2015-11-30 ENCOUNTER — Ambulatory Visit (INDEPENDENT_AMBULATORY_CARE_PROVIDER_SITE_OTHER): Payer: Medicare Other | Admitting: Family Medicine

## 2015-11-30 VITALS — BP 114/68 | HR 61 | Temp 97.7°F | Resp 16 | Wt 225.0 lb

## 2015-11-30 DIAGNOSIS — E118 Type 2 diabetes mellitus with unspecified complications: Secondary | ICD-10-CM

## 2015-11-30 DIAGNOSIS — I10 Essential (primary) hypertension: Secondary | ICD-10-CM

## 2015-11-30 DIAGNOSIS — IMO0002 Reserved for concepts with insufficient information to code with codable children: Secondary | ICD-10-CM

## 2015-11-30 DIAGNOSIS — E1165 Type 2 diabetes mellitus with hyperglycemia: Secondary | ICD-10-CM | POA: Diagnosis not present

## 2015-11-30 LAB — POCT UA - MICROALBUMIN: MICROALBUMIN (UR) POC: 20 mg/L

## 2015-11-30 LAB — POCT GLYCOSYLATED HEMOGLOBIN (HGB A1C)
ESTIMATED AVERAGE GLUCOSE: 197
Hemoglobin A1C: 8.5

## 2015-11-30 MED ORDER — PIOGLITAZONE HCL 45 MG PO TABS: 45.0000 mg | ORAL_TABLET | Freq: Every day | ORAL | Status: DC

## 2015-11-30 MED ORDER — GLIPIZIDE ER 10 MG PO TB24: 10.0000 mg | ORAL_TABLET | Freq: Every day | ORAL | Status: DC

## 2015-11-30 NOTE — Progress Notes (Signed)
Patient: Stuart Henry Male    DOB: August 31, 1940   75 y.o.   MRN: [id number] Visit Date: 11/30/2015  Today's Provider: [name], MD   Chief Complaint  Patient presents with  . Hypertension    follow up  . Diabetes    follow up   Subjective:    HPI  Hypertension, follow-up:  BP Readings from Last 3 Encounters:  08/30/15 112/72  06/12/15 128/72  02/23/15 120/62    He was last seen for hypertension 9 months ago.  BP at that visit was 120/62. Management since that visit includes no changes. He reports good compliance with treatment. He is not having side effects.  He is exercising. He is not adherent to low salt diet.   Outside blood pressures are not being checked. He is experiencing fatigue.  Patient denies chest pain, chest pressure/discomfort, claudication, dyspnea, exertional chest pressure/discomfort, irregular heart beat, lower extremity edema, near-syncope, orthopnea, palpitations, paroxysmal nocturnal dyspnea, syncope and tachypnea.   Cardiovascular risk factors include advanced age (older than 100 for men, 82 for women), diabetes mellitus, hypertension and male gender.  Use of agents associated with hypertension: NSAIDS.     Weight trend: stable Wt Readings from Last 3 Encounters:  08/30/15 223 lb (101.152 kg)  06/12/15 226 lb (102.513 kg)  02/23/15 224 lb (101.606 kg)    Current diet: in general, a "healthy" diet    ------------------------------------------------------------------------   Diabetes Mellitus Type II, Follow-up:   Lab Results  Component Value Date   HGBA1C 9.2 08/30/2015   HGBA1C 8.2 06/12/2015   HGBA1C 9.1* 02/26/2015    Last seen for diabetes 3 months ago.  Management since then includes changing from Janumet to Metformin 1,000mg  twice daily. Also added Actos. His blood sugars initially went up into 200 so he double up glipizide to 2 a day.   He reports good compliance with treatment. He is not having side effects.    Current symptoms include none and have been stable. Home blood sugar records: fasting range: 170's  Episodes of hypoglycemia? no   Current Insulin Regimen: none Most Recent Eye Exam: <1 year Weight trend: stable Prior visit with dietician: no Current diet: in general, a "healthy" diet   Current exercise: walking and golfing  Pertinent Labs:    Component Value Date/Time   CHOL 161 02/26/2015 0836   CHOL 154 10/25/2013   TRIG 250* 02/26/2015 0836   HDL 28* 02/26/2015 0836   HDL 28* 10/25/2013   LDLCALC 83 02/26/2015 0836   LDLCALC 95 10/25/2013   CREATININE 1.07 02/26/2015 0836   CREATININE 1.2 03/10/2014    Wt Readings from Last 3 Encounters:  08/30/15 223 lb (101.152 kg)  06/12/15 226 lb (102.513 kg)  02/23/15 224 lb (101.606 kg)    ------------------------------------------------------------------------      No Known Allergies Previous Medications   ACCU-CHEK AVIVA PLUS TEST STRIP    Check blood sugar once  daily   ALLOPURINOL (ZYLOPRIM) 100 MG TABLET    TAKE 1 TABLET DAILY   AMLODIPINE (NORVASC) 10 MG TABLET    TAKE 1 TABLET DAILY   ASPIRIN 81 MG TABLET    Take 81 mg by mouth daily.   BENAZEPRIL-HYDROCHLORTHIAZIDE (LOTENSIN HCT) 20-12.5 MG TABLET    TAKE 1 TABLET DAILY   GABAPENTIN (NEURONTIN) 300 MG CAPSULE    Take 1 capsule (300 mg total) by mouth at bedtime.   GLIPIZIDE (GLUCOTROL) 5 MG TABLET    TAKE 1 TABLET DAILY  IBUPROFEN (ADVIL,MOTRIN) 200 MG TABLET    Take 200 mg by mouth every 6 (six) hours as needed.   LOVASTATIN (MEVACOR) 40 MG TABLET    TAKE 1 TABLET EVERY EVENING   METFORMIN (GLUCOPHAGE) 1000 MG TABLET    Take 1 tablet (1,000 mg total) by mouth 2 (two) times daily with a meal.   PIOGLITAZONE (ACTOS) 30 MG TABLET    Take 1 tablet (30 mg total) by mouth daily.   REPAGLINIDE (PRANDIN) 1 MG TABLET    TAKE ONE TABLET BY MOUTH UP TO 3 TIMES DAILY BEFORE MEALS FOR BLOOD SUGAR    Review of Systems  Constitutional: Positive for fatigue. Negative for  fever, chills and appetite change.  Respiratory: Negative for chest tightness, shortness of breath and wheezing.   Cardiovascular: Negative for chest pain and palpitations.  Gastrointestinal: Negative for nausea, vomiting and abdominal pain.    Social History  Substance Use Topics  . Smoking status: Former Smoker -- 1.00 packs/day for 5 years    Types: Cigarettes    Quit date: 08/06/1972  . Smokeless tobacco: Never Used  . Alcohol Use: No   Objective:   BP 114/68 mmHg  Pulse 61  Temp(Src) 97.7 F (36.5 C) (Oral)  Resp 16  Wt 225 lb (102.059 kg)  SpO2 97%  Physical Exam   General Appearance:    Alert, cooperative, no distress  Eyes:    PERRL, conjunctiva/corneas clear, EOM's intact       Lungs:     Clear to auscultation bilaterally, respirations unlabored  Heart:    Regular rate and rhythm  Neurologic:   Awake, alert, oriented x 3. No apparent focal neurological           defect.        Results for orders placed or performed in visit on 11/30/15  POCT HgB A1C  Result Value Ref Range   Hemoglobin A1C 8.5    Est. average glucose Bld gHb Est-mCnc 197   POCT UA - Microalbumin  Result Value Ref Range   Microalbumin Ur, POC 20 mg/L   Creatinine, POC n/a mg/dL   Albumin/Creatinine Ratio, Urine, POC n/a         Assessment & Plan:     1. Uncontrolled type 2 diabetes mellitus with complication, without long-term current use of insulin ([place]) Improving since starting pioglitazone, but not to goal.  Increase pioglitzaone to 45. Change glipizide from 2x5mg  to 1x10mg  daily.  - POCT HgB A1C - POCT UA - Microalbumin - glipiZIDE (GLUCOTROL XL) 10 MG 24 hr tablet; Take 1 tablet (10 mg total) by mouth daily with breakfast.  Dispense: 90 tablet; Refill: 3 - pioglitazone (ACTOS) 45 MG tablet; Take 1 tablet (45 mg total) by mouth daily.  Dispense: 90 tablet; Refill: 3  2. Essential (primary) hypertension Well controlled.  Continue current medications.         [name], MD    [facility]

## 2016-01-21 ENCOUNTER — Other Ambulatory Visit: Payer: Self-pay | Admitting: Family Medicine

## 2016-03-01 ENCOUNTER — Other Ambulatory Visit: Payer: Self-pay | Admitting: Family Medicine

## 2016-03-01 DIAGNOSIS — G629 Polyneuropathy, unspecified: Secondary | ICD-10-CM

## 2016-03-28 ENCOUNTER — Ambulatory Visit (INDEPENDENT_AMBULATORY_CARE_PROVIDER_SITE_OTHER): Payer: Medicare Other | Admitting: Family Medicine

## 2016-03-28 ENCOUNTER — Encounter: Payer: Self-pay | Admitting: Family Medicine

## 2016-03-28 VITALS — BP 102/60 | HR 74 | Temp 97.5°F | Resp 16 | Wt 228.0 lb

## 2016-03-28 DIAGNOSIS — E1165 Type 2 diabetes mellitus with hyperglycemia: Secondary | ICD-10-CM

## 2016-03-28 DIAGNOSIS — I10 Essential (primary) hypertension: Secondary | ICD-10-CM

## 2016-03-28 DIAGNOSIS — R5383 Other fatigue: Secondary | ICD-10-CM

## 2016-03-28 DIAGNOSIS — E118 Type 2 diabetes mellitus with unspecified complications: Secondary | ICD-10-CM

## 2016-03-28 DIAGNOSIS — R42 Dizziness and giddiness: Secondary | ICD-10-CM

## 2016-03-28 DIAGNOSIS — IMO0002 Reserved for concepts with insufficient information to code with codable children: Secondary | ICD-10-CM

## 2016-03-28 DIAGNOSIS — E785 Hyperlipidemia, unspecified: Secondary | ICD-10-CM | POA: Diagnosis not present

## 2016-03-28 LAB — POCT GLYCOSYLATED HEMOGLOBIN (HGB A1C): Hemoglobin A1C: 8.5

## 2016-03-28 NOTE — Progress Notes (Signed)
Patient: Stuart Henry Male    DOB: [birth year]   75 y.o.   MRN: [id number] Visit Date: 03/28/2016  Today's Provider: [name], MD   Chief Complaint  Patient presents with  . Diabetes  . Hypertension  . Fatigue   Subjective:    HPI  Diabetes Mellitus Type II, Follow-up:   Lab Results  Component Value Date   HGBA1C 8.5 11/30/2015   HGBA1C 9.2 08/30/2015   HGBA1C 8.2 06/12/2015    Last seen for diabetes 4 months ago.  Management since then includes increased Actos to 45 mg qdand Glipizide to 10 mg daily. He reports good compliance with treatment. Current symptoms include hyperglycemia 170's-200's and have been unchanged. Home blood sugar records: 170's-200's  Episodes of hypoglycemia? no   Current Insulin Regimen: n/a Most Recent Eye Exam: 11/2015 Weight trend: fluctuating a bit Current exercise: none due to not feeling well. He removed 2 ticks off of him 2-3 weeks ago. He reports that 2 weeks ago he started feeling fatigued more than normal, abdominal pain that feels like it gargles up to his throat. No fevers, chills, or body aches. He does have some dizziness when he stands up. Denies chest pain or shortness of breath.   Pertinent Labs:    Component Value Date/Time   CHOL 161 02/26/2015 0836   TRIG 250 (H) 02/26/2015 0836   HDL 28 (L) 02/26/2015 0836   LDLCALC 83 02/26/2015 0836   CREATININE 1.07 02/26/2015 0836    Wt Readings from Last 3 Encounters:  03/28/16 228 lb (103.4 kg)  11/30/15 225 lb (102.1 kg)  08/30/15 223 lb (101.2 kg)    ------------------------------------------------------------------------   Hypertension, follow-up:  BP Readings from Last 3 Encounters:  03/28/16 102/60  11/30/15 114/68  08/30/15 112/72    He was last seen for hypertension 4 months ago.  BP at that visit was 114/68. Management since that visit includes none. He reports good compliance with treatment. He is not having side effects.  He is not  exercising. He is adherent to low salt diet.   Outside blood pressures are not being checked. He is experiencing fatigue.  Patient denies chest pain, chest pressure/discomfort, claudication, dyspnea, exertional chest pressure/discomfort, irregular heart beat, lower extremity edema, near-syncope, orthopnea, palpitations, paroxysmal nocturnal dyspnea and syncope.   Cardiovascular risk factors include advanced age (older than 50 for men, 69 for women), diabetes mellitus, hypertension and male gender.     Wt Readings from Last 3 Encounters:  03/28/16 228 lb (103.4 kg)  11/30/15 225 lb (102.1 kg)  08/30/15 223 lb (101.2 kg)     ------------------------------------------------------------------------  Complains one week of fatigue and malaise with intermittent generalized abdominal pain. Was bit by a couple of ticks a few weeks ago. No rashes. No fevers or chills. Gets dizzy when he stands up. Has not been checking home blood pressures.     No Known Allergies Current Meds  Medication Sig  . sitaGLIPtin-metformin (JANUMET) 50-1000 MG tablet Take by mouth.    Review of Systems  Constitutional: Positive for fatigue.  Eyes: Negative.   Respiratory: Negative.   Cardiovascular: Negative.   Gastrointestinal: Positive for abdominal pain.  Endocrine: Negative.   Genitourinary: Negative.   Musculoskeletal: Negative.   Skin: Negative.   Neurological: Positive for dizziness and weakness.  Hematological: Negative.   Psychiatric/Behavioral: Negative.     Social History  Substance Use Topics  . Smoking status: Former Smoker    Packs/day: 1.00  Years: 5.00    Types: Cigarettes    Quit date: 08/06/1972  . Smokeless tobacco: Never Used  . Alcohol use No   Objective:   BP 102/60 (BP Location: Right Arm, Patient Position: Sitting, Cuff Size: Normal)   Pulse 74   Temp 97.5 F (36.4 C) (Oral)   Resp 16   Wt 228 lb (103.4 kg)   BMI 27.39 kg/m   Physical Exam  General Appearance:     Alert, cooperative, no distress  Eyes:    PERRL, conjunctiva/corneas clear, EOM's intact       Lungs:     Clear to auscultation bilaterally, respirations unlabored  Heart:    Regular rate and rhythm  Abdomen:   bowel sounds present and normal in all 4 quadrants, soft, round, nontender or nondistended. No CVA tenderness    Results for orders placed or performed in visit on 03/28/16  POCT HgB A1C  Result Value Ref Range   Hemoglobin A1C 8.5    EKG: NSR    Assessment & Plan:     .1. Uncontrolled type 2 diabetes mellitus with complication, without long-term current use of insulin ([place]) Consider addition of SGLT2 inhibitor or GLP-1 agonist after reviewing labs.  - POCT HgB A1C  2. Essential (primary) hypertension BP is low today. His to check BP every morning. If SBP<110 he is not to take lisinopril-hctz  3. Hyperlipidemia Currently off of statin as he was previously concerned it may be worsening his blood sugars.  - Lipid panel  4. Other fatigue  - Comprehensive metabolic panel - CBC with Differential/Platelet - T4 AND TSH - B. Burgdorfi Antibodies  5. Dizziness He has had a tick bit a few weeks ago and is concerned about tick borne infections.  - EKG 12-Lead - Comprehensive metabolic panel - CBC with Differential/Platelet - Troponin I - B. Burgdorfi Antibodies       [name], MD  [facility]

## 2016-03-30 LAB — COMPREHENSIVE METABOLIC PANEL
ALBUMIN: 4.8 g/dL (ref 3.5–4.8)
ALK PHOS: 40 IU/L (ref 39–117)
ALT: 14 IU/L (ref 0–44)
AST: 10 IU/L (ref 0–40)
Albumin/Globulin Ratio: 1.8 (ref 1.2–2.2)
BUN / CREAT RATIO: 19 (ref 10–24)
BUN: 23 mg/dL (ref 8–27)
Bilirubin Total: 0.6 mg/dL (ref 0.0–1.2)
CO2: 21 mmol/L (ref 18–29)
Calcium: 9.9 mg/dL (ref 8.6–10.2)
Chloride: 97 mmol/L (ref 96–106)
Creatinine, Ser: 1.18 mg/dL (ref 0.76–1.27)
GFR, EST AFRICAN AMERICAN: 70 mL/min/{1.73_m2} (ref >59)
GFR, EST NON AFRICAN AMERICAN: 61 mL/min/{1.73_m2} (ref >59)
GLOBULIN, TOTAL: 2.6 g/dL (ref 1.5–4.5)
Glucose: 204 mg/dL — ABNORMAL HIGH (ref 65–99)
Potassium: 4.9 mmol/L (ref 3.5–5.2)
SODIUM: 140 mmol/L (ref 134–144)
TOTAL PROTEIN: 7.4 g/dL (ref 6.0–8.5)

## 2016-03-30 LAB — T4 AND TSH
T4, Total: 6.9 ug/dL (ref 4.5–12.0)
TSH: 4.38 u[IU]/mL (ref 0.450–4.500)

## 2016-03-30 LAB — CBC WITH DIFFERENTIAL/PLATELET
BASOS ABS: 0 10*3/uL (ref 0.0–0.2)
Basos: 1 %
EOS (ABSOLUTE): 0.2 10*3/uL (ref 0.0–0.4)
Eos: 4 %
Hematocrit: 40.8 % (ref 37.5–51.0)
Hemoglobin: 14.1 g/dL (ref 12.6–17.7)
IMMATURE GRANS (ABS): 0 10*3/uL (ref 0.0–0.1)
IMMATURE GRANULOCYTES: 0 %
LYMPHS: 23 %
Lymphocytes Absolute: 1.2 10*3/uL (ref 0.7–3.1)
MCH: 30.9 pg (ref 26.6–33.0)
MCHC: 34.6 g/dL (ref 31.5–35.7)
MCV: 89 fL (ref 79–97)
MONOS ABS: 0.4 10*3/uL (ref 0.1–0.9)
Monocytes: 8 %
NEUTROS PCT: 64 %
Neutrophils Absolute: 3.4 10*3/uL (ref 1.4–7.0)
PLATELETS: 226 10*3/uL (ref 150–379)
RBC: 4.57 x10E6/uL (ref 4.14–5.80)
RDW: 13.9 % (ref 12.3–15.4)
WBC: 5.2 10*3/uL (ref 3.4–10.8)

## 2016-03-30 LAB — LIPID PANEL
CHOL/HDL RATIO: 5.4 ratio — AB (ref 0.0–5.0)
CHOLESTEROL TOTAL: 179 mg/dL (ref 100–199)
HDL: 33 mg/dL — ABNORMAL LOW (ref >39)
LDL CALC: 97 mg/dL (ref 0–99)
Triglycerides: 246 mg/dL — ABNORMAL HIGH (ref 0–149)
VLDL CHOLESTEROL CAL: 49 mg/dL — AB (ref 5–40)

## 2016-03-30 LAB — B. BURGDORFI ANTIBODIES

## 2016-03-30 LAB — TROPONIN I: Troponin I: <0.01 ng/mL (ref 0.00–0.04)

## 2016-03-31 ENCOUNTER — Telehealth: Payer: Self-pay

## 2016-03-31 NOTE — Telephone Encounter (Signed)
Most common side effect is increased urination. Can sometimes lead to bladder infection, but that is usually only in women.  It can cause a drop in sodium levels so we will need to recheck that at his follow up.   Jardiance can lower blood pressure, especially at higher doses which is why we are starting with the low 10mg  dose. He can put lisinopril-hctz on hold until we know how it effects his blood pressure. If he has a blood pressure cuff he should check it every 2 or 3 days and let us know if it runs high or low. If not he can stop by office in a week to make sure it stays within normal range.

## 2016-03-31 NOTE — Telephone Encounter (Signed)
Patient walked into office today to pick up sample of Jardiance, patient states that he had a few questions for provider in regards to medication. Patient would like to know side effects of medication and if this will lower is blood pressure? Patient states that at last office visit his blood pressure was 102/60 and is very low for him, patient states that he is concerned that this medication is bad for him to take and can possibly be fatal. Patient states if you want him to start Jardiance do you want him to discontinue his blood pressure medication? Patient reported that he did research online with his symptoms of fatigue and blood work results he believes that he might have a B12 and a B9 vitamin defency, patient would like to know if he should start vitamin supplement to help increase his energy level? And or get screening labs to check vitamin level.  Patient request a callback from Dr. Caryn Section only, he states that he can be reached on his cell at [phone]. KW

## 2016-03-31 NOTE — Telephone Encounter (Signed)
Pt advised as directed below.  He does have a blood pressure monitor at home and will start checking his blood pressure.   Thanks,   -Mickel Baas

## 2016-04-02 ENCOUNTER — Other Ambulatory Visit: Payer: Self-pay | Admitting: Family Medicine

## 2016-04-17 ENCOUNTER — Encounter: Payer: Self-pay | Admitting: Family Medicine

## 2016-04-17 ENCOUNTER — Telehealth: Payer: Self-pay | Admitting: Family Medicine

## 2016-04-17 ENCOUNTER — Ambulatory Visit (INDEPENDENT_AMBULATORY_CARE_PROVIDER_SITE_OTHER): Payer: Medicare Other | Admitting: Family Medicine

## 2016-04-17 VITALS — BP 104/54 | HR 83 | Temp 98.7°F | Resp 16 | Wt 225.0 lb

## 2016-04-17 DIAGNOSIS — J069 Acute upper respiratory infection, unspecified: Secondary | ICD-10-CM | POA: Diagnosis not present

## 2016-04-17 DIAGNOSIS — I10 Essential (primary) hypertension: Secondary | ICD-10-CM

## 2016-04-17 DIAGNOSIS — E1165 Type 2 diabetes mellitus with hyperglycemia: Secondary | ICD-10-CM | POA: Diagnosis not present

## 2016-04-17 DIAGNOSIS — E118 Type 2 diabetes mellitus with unspecified complications: Secondary | ICD-10-CM | POA: Diagnosis not present

## 2016-04-17 DIAGNOSIS — IMO0002 Reserved for concepts with insufficient information to code with codable children: Secondary | ICD-10-CM

## 2016-04-17 MED ORDER — AMOXICILLIN 500 MG PO CAPS: 1000.0000 mg | ORAL_CAPSULE | Freq: Two times a day (BID) | ORAL | 0 refills | Status: AC

## 2016-04-17 MED ORDER — FLUTICASONE PROPIONATE 50 MCG/ACT NA SUSP: 2.0000 | Freq: Every day | NASAL | 6 refills | Status: DC

## 2016-04-17 MED ORDER — AMOXICILLIN 500 MG PO CAPS: 1000.0000 mg | ORAL_CAPSULE | Freq: Two times a day (BID) | ORAL | 0 refills | Status: DC

## 2016-04-17 NOTE — Progress Notes (Signed)
Patient: Stuart Henry Male    DOB: [birth year]   75 y.o.   MRN: [id number] Visit Date: 04/17/2016  Today's Provider: [name], MD   Chief Complaint  Patient presents with  . Cough   Subjective:    Sinus congestion and cough started 3 days ago. Patient stated that his nose is running constantly. Cough is productive. Symptoms of sore throat, slight wheezing, sinus congestion and runny nose. Patient has taken otc sudafed with mild relief.    Cough  This is a new problem. The current episode started in the past 7 days (3 days). The problem has been gradually worsening. The problem occurs every few minutes. The cough is productive of sputum. Associated symptoms include nasal congestion, postnasal drip, rhinorrhea, a sore throat and wheezing. Pertinent negatives include no chest pain, chills, ear congestion, ear pain, fever, headaches, heartburn, hemoptysis, myalgias, rash, shortness of breath, sweats or weight loss. Nothing aggravates the symptoms. Treatments tried: allergie medication. The treatment provided mild relief. His past medical history is significant for bronchitis. There is no history of asthma, bronchiectasis, COPD, emphysema, environmental allergies or pneumonia.   Follow up diabetes since starting Jardiance 3 weeks ago. Is tolerating well, and noticed significant improvement in blood sugars mostly running in the low to mid 100s. He would like to cut out pioglitazone since sugars have improved so much.    No Known Allergies   Current Outpatient Prescriptions:  .  ACCU-CHEK AVIVA PLUS test strip, Check blood sugar once  daily, Disp: 100 each, Rfl: 4 .  allopurinol (ZYLOPRIM) 100 MG tablet, TAKE 1 TABLET DAILY, Disp: 90 tablet, Rfl: 4 .  amLODipine (NORVASC) 10 MG tablet, TAKE 1 TABLET DAILY, Disp: 90 tablet, Rfl: 4 .  aspirin 81 MG tablet, Take 81 mg by mouth daily., Disp: , Rfl:  .  benazepril-hydrochlorthiazide (LOTENSIN HCT) 20-12.5 MG tablet, TAKE 1 TABLET  DAILY, Disp: 90 tablet, Rfl: 3 .  gabapentin (NEURONTIN) 300 MG capsule, TAKE ONE CAPSULE BY MOUTH AT BEDTIME FOR NEUROPATHY, Disp: 90 capsule, Rfl: 4 .  glipiZIDE (GLUCOTROL XL) 10 MG 24 hr tablet, Take 1 tablet (10 mg total) by mouth daily with breakfast., Disp: 90 tablet, Rfl: 3 .  ibuprofen (ADVIL,MOTRIN) 200 MG tablet, Take 200 mg by mouth every 6 (six) hours as needed., Disp: , Rfl:  .  metFORMIN (GLUCOPHAGE) 1000 MG tablet, Take 1 tablet (1,000 mg total) by mouth 2 (two) times daily with a meal., Disp: 180 tablet, Rfl: 3 .  pioglitazone (ACTOS) 45 MG tablet, Take 1 tablet (45 mg total) by mouth daily., Disp: 90 tablet, Rfl: 3 .  repaglinide (PRANDIN) 1 MG tablet, TAKE ONE TABLET BY MOUTH UP TO 3 TIMES DAILY BEFORE MEALS FOR BLOOD SUGAR, Disp: 30 tablet, Rfl: 12  Review of Systems  Constitutional: Negative for appetite change, chills, fever and weight loss.  HENT: Positive for postnasal drip, rhinorrhea and sore throat. Negative for ear pain.   Respiratory: Positive for cough and wheezing. Negative for hemoptysis, chest tightness and shortness of breath.   Cardiovascular: Negative for chest pain and palpitations.  Gastrointestinal: Negative for abdominal pain, heartburn, nausea and vomiting.  Musculoskeletal: Negative for myalgias.  Skin: Negative for rash.  Allergic/Immunologic: Negative for environmental allergies.  Neurological: Negative for headaches.    Social History  Substance Use Topics  . Smoking status: Former Smoker    Packs/day: 1.00    Years: 5.00    Types: Cigarettes    Quit date:  08/06/1972  . Smokeless tobacco: Never Used  . Alcohol use No   Objective:   BP (!) 104/54 (BP Location: Right Arm, Patient Position: Sitting, Cuff Size: Large)   Pulse 83   Temp 98.7 F (37.1 C) (Oral)   Resp 16   Wt 225 lb (102.1 kg)   SpO2 96%   BMI 27.03 kg/m   Physical Exam  General Appearance:    Alert, cooperative, no distress  HENT:   bilateral TM normal without fluid or  infection, neck without nodes, pharynx erythematous without exudate, frontal sinus tender and nasal mucosa pale and congested  Eyes:    PERRL, conjunctiva/corneas clear, EOM's intact       Lungs:     Clear to auscultation bilaterally, respirations unlabored  Heart:    Regular rate and rhythm  Neurologic:   Awake, alert, oriented x 3. No apparent focal neurological           defect.           Assessment & Plan:     1. Uncontrolled type 2 diabetes mellitus with complication, without long-term current use of insulin ([place]) Doing well with initiation of jardiance. Will put pioglitazone on hold for now. Check renal panel prior to follow up O.V. In 3 weeks.   2. Essential (primary) hypertension Well controlled.  Consider reducing BP medications at follow up.  - Renal function panel  3. Upper respiratory infection Fluticasone nasal spray. Can fill rx for amoxicillin if not improving in 3-4 days.        [name], MD  [facility]

## 2016-04-17 NOTE — Patient Instructions (Signed)
You can put Actos (pioglitazone) on hold for the time being  Have blood drawn (Renal panel) a day or two before your next office visit

## 2016-04-17 NOTE — Telephone Encounter (Signed)
Per Dr. Caryn Section, patient can come now and we will work him in to be seen. Patient advised.

## 2016-04-17 NOTE — Telephone Encounter (Signed)
Pt stated that he has a really bad head cold or possible sinus infection and would like to be seen today. No openings on anyone's schedule. Can pt be worked in? Please advise. Thanks TNP

## 2016-04-17 NOTE — Telephone Encounter (Signed)
Please advise 

## 2016-05-06 LAB — RENAL FUNCTION PANEL
Albumin: 4.6 g/dL (ref 3.5–4.8)
BUN / CREAT RATIO: 17 (ref 10–24)
BUN: 22 mg/dL (ref 8–27)
CALCIUM: 9.1 mg/dL (ref 8.6–10.2)
CHLORIDE: 100 mmol/L (ref 96–106)
CO2: 21 mmol/L (ref 18–29)
Creatinine, Ser: 1.32 mg/dL — ABNORMAL HIGH (ref 0.76–1.27)
GFR calc non Af Amer: 53 mL/min/{1.73_m2} — ABNORMAL LOW (ref >59)
GFR, EST AFRICAN AMERICAN: 61 mL/min/{1.73_m2} (ref >59)
GLUCOSE: 193 mg/dL — AB (ref 65–99)
POTASSIUM: 4.5 mmol/L (ref 3.5–5.2)
Phosphorus: 3.7 mg/dL (ref 2.5–4.5)
SODIUM: 137 mmol/L (ref 134–144)

## 2016-05-13 ENCOUNTER — Encounter: Payer: Self-pay | Admitting: Family Medicine

## 2016-05-13 ENCOUNTER — Ambulatory Visit (INDEPENDENT_AMBULATORY_CARE_PROVIDER_SITE_OTHER): Payer: Medicare Other | Admitting: Family Medicine

## 2016-05-13 VITALS — BP 102/60 | HR 74 | Temp 97.6°F | Resp 16 | Wt 226.0 lb

## 2016-05-13 DIAGNOSIS — E1165 Type 2 diabetes mellitus with hyperglycemia: Secondary | ICD-10-CM

## 2016-05-13 DIAGNOSIS — E1140 Type 2 diabetes mellitus with diabetic neuropathy, unspecified: Secondary | ICD-10-CM | POA: Diagnosis not present

## 2016-05-13 DIAGNOSIS — I10 Essential (primary) hypertension: Secondary | ICD-10-CM | POA: Diagnosis not present

## 2016-05-13 DIAGNOSIS — IMO0002 Reserved for concepts with insufficient information to code with codable children: Secondary | ICD-10-CM

## 2016-05-13 LAB — POCT GLYCOSYLATED HEMOGLOBIN (HGB A1C)
ESTIMATED AVERAGE GLUCOSE: 186
HEMOGLOBIN A1C: 8.1

## 2016-05-13 MED ORDER — AMLODIPINE BESYLATE 5 MG PO TABS: 5.0000 mg | ORAL_TABLET | Freq: Every day | ORAL | 3 refills | Status: DC

## 2016-05-13 MED ORDER — AMLODIPINE BESYLATE 5 MG PO TABS: 10.0000 mg | ORAL_TABLET | Freq: Every day | ORAL | 3 refills | Status: DC

## 2016-05-13 MED ORDER — EMPAGLIFLOZIN 25 MG PO TABS: 25.0000 mg | ORAL_TABLET | Freq: Every day | ORAL | 3 refills | Status: DC

## 2016-05-13 NOTE — Progress Notes (Signed)
Patient: Stuart Henry Male    DOB: 1941-01-19   75 y.o.   MRN: [id number] Visit Date: 05/13/2016  Today's Provider: [name], MD   Chief Complaint  Patient presents with  . Diabetes    follow up  . Hypertension    follow up   Subjective:    HPI  Diabetes Mellitus Type II, Follow-up:   Lab Results  Component Value Date   HGBA1C 8.5 03/28/2016   HGBA1C 8.5 11/30/2015   HGBA1C 9.2 08/30/2015    Last seen for diabetes 4 weeks ago.  Management since then includes putting Pioglitazone on hold and starting samples of Jardiance 55m daily. Has since had follow up met b: BMET    Component Value Date/Time   NA 137 05/05/2016 0842   K 4.5 05/05/2016 0842   CL 100 05/05/2016 0842   CO2 21 05/05/2016 0842   GLUCOSE 193 (H) 05/05/2016 0842   BUN 22 05/05/2016 0842   CREATININE 1.32 (H) 05/05/2016 0842   CALCIUM 9.1 05/05/2016 0842   GFRNONAA 53 (L) 05/05/2016 0842   GFRAA 61 05/05/2016 0842    He reports good compliance with treatment. He is not having side effects.  Current symptoms include none and have been stable. Home blood sugar records: fasting range: 150-200  They had been running in mid 100s before he went [place] 2 weeks ago.   Episodes of hypoglycemia? no   Current Insulin Regimen: none Most Recent Eye Exam: 05/01/2016 Weight trend: stable Prior visit with dietician: no Current diet: in general, an "unhealthy" diet Current exercise: none  Pertinent Labs:    Component Value Date/Time   CHOL 179 03/28/2016 0920   TRIG 246 (H) 03/28/2016 0920   HDL 33 (L) 03/28/2016 0920   LDLCALC 97 03/28/2016 0920   CREATININE 1.32 (H) 05/05/2016 0842    Wt Readings from Last 3 Encounters:  04/17/16 225 lb (102.1 kg)  03/28/16 228 lb (103.4 kg)  11/30/15 225 lb (102.1 kg)    ------------------------------------------------------------------------  Hypertension, follow-up:  BP Readings from Last 3 Encounters:  04/17/16 (!)  104/54  03/28/16 102/60  11/30/15 114/68    He was last seen for hypertension 4 weeks ago.  BP at that visit was 104/54. Management since that visit includes no changes. Will consider reducing blood pressure medications at follow up. He reports good compliance with treatment. He is not having side effects.  He is not exercising. He is not adherent to low salt diet.   Outside blood pressures are  116-121(systolic) over 785-02(diastolic). He is experiencing none.  Patient denies chest pain, chest pressure/discomfort, claudication, dyspnea, exertional chest pressure/discomfort, irregular heart beat, lower extremity edema, near-syncope, orthopnea, palpitations, paroxysmal nocturnal dyspnea, syncope and tachypnea.   Cardiovascular risk factors include advanced age (older than 554for men, 658for women), diabetes mellitus, hypertension, male gender and sedentary lifestyle.  Use of agents associated with hypertension: NSAIDS.     Weight trend: stable Wt Readings from Last 3 Encounters:  04/17/16 225 lb (102.1 kg)  03/28/16 228 lb (103.4 kg)  11/30/15 225 lb (102.1 kg)    Current diet: in general, an "unhealthy" diet  ------------------------------------------------------------------------     No Known Allergies   Current Outpatient Prescriptions:  .  ACCU-CHEK AVIVA PLUS test strip, Check blood sugar once  daily, Disp: 100 each, Rfl: 4 .  allopurinol (ZYLOPRIM) 100 MG tablet, TAKE 1 TABLET DAILY, Disp: 90 tablet, Rfl: 4 .  amLODipine (NORVASC) 10 MG tablet, Take 1 tablets (10 mg total) by mouth daily., Disp: 90 tablet, Rfl: 3 .  aspirin 81 MG tablet, Take 81 mg by mouth daily., Disp: , Rfl:  .  benazepril-hydrochlorthiazide (LOTENSIN HCT) 20-12.5 MG tablet, TAKE 1 TABLET DAILY, Disp: 90 tablet, Rfl: 3 .  empagliflozin (JARDIANCE) 25 MG TABS tablet, Take 25 mg by mouth daily., Disp: 903 tablet, Rfl: 3 .  fluticasone (FLONASE) 50 MCG/ACT nasal spray, Place 2 sprays into both nostrils  daily., Disp: 16 g, Rfl: 6 .  gabapentin (NEURONTIN) 300 MG capsule, TAKE ONE CAPSULE BY MOUTH AT BEDTIME FOR NEUROPATHY, Disp: 90 capsule, Rfl: 4 .  glipiZIDE (GLUCOTROL XL) 10 MG 24 hr tablet, Take 1 tablet (10 mg total) by mouth daily with breakfast., Disp: 90 tablet, Rfl: 3 .  ibuprofen (ADVIL,MOTRIN) 200 MG tablet, Take 200 mg by mouth every 6 (six) hours as needed., Disp: , Rfl:  .  metFORMIN (GLUCOPHAGE) 1000 MG tablet, Take 1 tablet (1,000 mg total) by mouth 2 (two) times daily with a meal., Disp: 180 tablet, Rfl: 3 .  repaglinide (PRANDIN) 1 MG tablet, TAKE ONE TABLET BY MOUTH UP TO 3 TIMES DAILY BEFORE MEALS FOR BLOOD SUGAR, Disp: 30 tablet, Rfl: 12  Review of Systems  Constitutional: Positive for fatigue. Negative for appetite change, chills and fever.  Respiratory: Negative for chest tightness, shortness of breath and wheezing.   Cardiovascular: Negative for chest pain and palpitations.  Gastrointestinal: Negative for abdominal pain, nausea and vomiting.    Social History  Substance Use Topics  . Smoking status: Former Smoker    Packs/day: 1.00    Years: 5.00    Types: Cigarettes    Quit date: 08/06/1972  . Smokeless tobacco: Never Used  . Alcohol use No   Objective:   BP 102/60 (BP Location: Left Arm, Patient Position: Sitting, Cuff Size: Large)   Pulse 74   Temp 97.6 F (36.4 C) (Oral)   Resp 16   Wt 226 lb (102.5 kg)   SpO2 95% Comment: room air  BMI 27.15 kg/m   Physical Exam   General Appearance:    Alert, cooperative, no distress  Eyes:    PERRL, conjunctiva/corneas clear, EOM's intact       Lungs:     Clear to auscultation bilaterally, respirations unlabored  Heart:    Regular rate and rhythm  Neurologic:   Awake, alert, oriented x 3. No apparent focal neurological           defect.       Results for orders placed or performed in visit on 05/13/16  POCT HgB A1C  Result Value Ref Range   Hemoglobin A1C 8.1    Est. average glucose Bld gHb Est-mCnc 186          Assessment & Plan:     1. Uncontrolled type 2 diabetes mellitus with diabetic neuropathy, without long-term current use of insulin ([facility]) Improved on Jardiance, however has only recently stopped pioglitazone. RF Jardiance 22m daily, rx sent to [facility]. Recheck A1c in 3 months. If not continuing to improve consider starting back on lower dose of pioglitazone.  - POCT HgB A1C  2. Hypertension Decrease amlodipine to 590mdaily.       The entirety of the information documented in the History of Present Illness, Review of Systems and Physical Exam were personally obtained by me. Portions of this information were initially documented by RoMeyer CoryCMA and reviewed by me for thoroughness and  accuracy.    [name], MD  [facility]

## 2016-05-16 ENCOUNTER — Other Ambulatory Visit: Payer: Self-pay | Admitting: Family Medicine

## 2016-05-16 MED ORDER — EMPAGLIFLOZIN 25 MG PO TABS: 25.0000 mg | ORAL_TABLET | Freq: Every day | ORAL | 6 refills | Status: DC

## 2016-05-16 NOTE — Telephone Encounter (Signed)
Pt contacted office for refill request on the following medications: empagliflozin (JARDIANCE) 25 MG TABS tablet  Pt stated he was advised by the mail order pharmacy that they can't filled this medication. Pt would like the Rx to be sent to [facility]. Please advise. Thanks TNP

## 2016-08-08 ENCOUNTER — Other Ambulatory Visit: Payer: Self-pay | Admitting: Family Medicine

## 2016-08-08 DIAGNOSIS — E1165 Type 2 diabetes mellitus with hyperglycemia: Principal | ICD-10-CM

## 2016-08-08 DIAGNOSIS — IMO0001 Reserved for inherently not codable concepts without codable children: Secondary | ICD-10-CM

## 2016-08-25 ENCOUNTER — Encounter: Payer: Self-pay | Admitting: Family Medicine

## 2016-08-25 ENCOUNTER — Ambulatory Visit (INDEPENDENT_AMBULATORY_CARE_PROVIDER_SITE_OTHER): Payer: Medicare Other | Admitting: Family Medicine

## 2016-08-25 VITALS — BP 110/72 | HR 62 | Temp 97.7°F | Resp 16 | Wt 220.0 lb

## 2016-08-25 DIAGNOSIS — I10 Essential (primary) hypertension: Secondary | ICD-10-CM

## 2016-08-25 DIAGNOSIS — E1140 Type 2 diabetes mellitus with diabetic neuropathy, unspecified: Secondary | ICD-10-CM

## 2016-08-25 DIAGNOSIS — E1165 Type 2 diabetes mellitus with hyperglycemia: Secondary | ICD-10-CM | POA: Diagnosis not present

## 2016-08-25 DIAGNOSIS — IMO0002 Reserved for concepts with insufficient information to code with codable children: Secondary | ICD-10-CM

## 2016-08-25 LAB — POCT GLYCOSYLATED HEMOGLOBIN (HGB A1C)
Est. average glucose Bld gHb Est-mCnc: 226
Hemoglobin A1C: 9.5

## 2016-08-25 MED ORDER — AMLODIPINE BESYLATE 5 MG PO TABS: 5.0000 mg | ORAL_TABLET | Freq: Every day | ORAL | 3 refills | Status: DC

## 2016-08-25 MED ORDER — PIOGLITAZONE HCL 45 MG PO TABS: 45.0000 mg | ORAL_TABLET | Freq: Every day | ORAL | 1 refills | Status: DC

## 2016-08-25 NOTE — Progress Notes (Signed)
Patient: Stuart Henry Male    DOB: [birth year]   76 y.o.   MRN: [id number] Visit Date: 08/25/2016  Today's Provider: [name], MD   Chief Complaint  Patient presents with  . Hypertension    follow up  . Diabetes    follow up   Subjective:    HPI  Hypertension, follow-up:  BP Readings from Last 3 Encounters:  05/13/16 102/60  04/17/16 (!) 104/54  03/28/16 102/60    He was last seen for hypertension 3 months ago.  BP at that visit was 102/60. Management since that visit includes decreasing Amlodipine to 5mg  daily. He reports good compliance with treatment. He is not having side effects.  He is not exercising. He is not adherent to low salt diet.   Outside blood pressures are running 117/79. He is experiencing none.  Patient denies chest pain, chest pressure/discomfort, claudication, dyspnea, exertional chest pressure/discomfort, fatigue, irregular heart beat, lower extremity edema, near-syncope, orthopnea, palpitations, paroxysmal nocturnal dyspnea, syncope and tachypnea.   Cardiovascular risk factors include advanced age (older than 84 for men, 83 for women), diabetes mellitus, hypertension and male gender.  Use of agents associated with hypertension: NSAIDS.     Weight trend: decreasing steadily Wt Readings from Last 3 Encounters:  05/13/16 226 lb (102.5 kg)  04/17/16 225 lb (102.1 kg)  03/28/16 228 lb (103.4 kg)    Current diet: in general, an "unhealthy" diet  ------------------------------------------------------------------------  Diabetes Mellitus Type II, Follow-up:   Lab Results  Component Value Date   HGBA1C 8.1 05/13/2016   HGBA1C 8.5 03/28/2016   HGBA1C 8.5 11/30/2015    Last seen for diabetes 3 months ago.  Management since then includes no changes. He reports good compliance with treatment. He is not having side effects.  Current symptoms include paresthesia of the feet and have been stable. Home blood sugar records: fasting  range: varies from 175-250  Episodes of hypoglycemia? no   Current Insulin Regimen: none Most Recent Eye Exam: <1 year ago Weight trend: decreasing steadily Prior visit with dietician: no Current diet: in general, an "unhealthy" diet Current exercise: none  Pertinent Labs:    Component Value Date/Time   CHOL 179 03/28/2016 0920   TRIG 246 (H) 03/28/2016 0920   HDL 33 (L) 03/28/2016 0920   LDLCALC 97 03/28/2016 0920   CREATININE 1.32 (H) 05/05/2016 0842    Wt Readings from Last 3 Encounters:  05/13/16 226 lb (102.5 kg)  04/17/16 225 lb (102.1 kg)  03/28/16 228 lb (103.4 kg)    ------------------------------------------------------------------------     No Known Allergies   Current Outpatient Prescriptions:  .  ACCU-CHEK AVIVA PLUS test strip, Check blood sugar once  daily, Disp: 100 each, Rfl: 4 .  allopurinol (ZYLOPRIM) 100 MG tablet, TAKE 1 TABLET DAILY, Disp: 90 tablet, Rfl: 4 .  amLODipine (NORVASC) 5 MG tablet, Take 1 tablet (5 mg total) by mouth daily., Disp: 90 tablet, Rfl: 3 .  aspirin 81 MG tablet, Take 81 mg by mouth daily., Disp: , Rfl:  .  benazepril-hydrochlorthiazide (LOTENSIN HCT) 20-12.5 MG tablet, TAKE 1 TABLET DAILY, Disp: 90 tablet, Rfl: 3 .  empagliflozin (JARDIANCE) 25 MG TABS tablet, Take 25 mg by mouth daily., Disp: 30 tablet, Rfl: 6 .  gabapentin (NEURONTIN) 300 MG capsule, TAKE ONE CAPSULE BY MOUTH AT BEDTIME FOR NEUROPATHY, Disp: 90 capsule, Rfl: 4 .  glipiZIDE (GLUCOTROL XL) 10 MG 24 hr tablet, Take 1 tablet (10 mg total) by mouth  daily with breakfast., Disp: 90 tablet, Rfl: 3 .  ibuprofen (ADVIL,MOTRIN) 200 MG tablet, Take 200 mg by mouth every 6 (six) hours as needed., Disp: , Rfl:  .  metFORMIN (GLUCOPHAGE) 1000 MG tablet, TAKE 1 TABLET TWICE A DAY WITH A MEAL, Disp: 180 tablet, Rfl: 4 .  repaglinide (PRANDIN) 1 MG tablet, TAKE ONE TABLET BY MOUTH UP TO 3 TIMES DAILY BEFORE MEALS FOR BLOOD SUGAR, Disp: 30 tablet, Rfl: 12 .  fluticasone  (FLONASE) 50 MCG/ACT nasal spray, Place 2 sprays into both nostrils daily., Disp: 16 g, Rfl: 6  Review of Systems  Constitutional: Positive for fatigue. Negative for appetite change, chills and fever.  Respiratory: Negative for chest tightness, shortness of breath and wheezing.   Cardiovascular: Negative for chest pain and palpitations.  Gastrointestinal: Negative for abdominal pain, nausea and vomiting.  Endocrine: Negative for cold intolerance, heat intolerance, polydipsia, polyphagia and polyuria.  Neurological: Positive for numbness (in feet).    Social History  Substance Use Topics  . Smoking status: Former Smoker    Packs/day: 1.00    Years: 5.00    Types: Cigarettes    Quit date: 08/06/1972  . Smokeless tobacco: Never Used  . Alcohol use No   Objective:   BP 110/72 (BP Location: Left Arm, Patient Position: Sitting, Cuff Size: Large)   Pulse 62   Temp 97.7 F (36.5 C) (Oral)   Resp 16   Wt 220 lb (99.8 kg)   SpO2 98% Comment: room air  BMI 26.43 kg/m   Physical Exam   General Appearance:    Alert, cooperative, no distress  Eyes:    PERRL, conjunctiva/corneas clear, EOM's intact       Lungs:     Clear to auscultation bilaterally, respirations unlabored  Heart:    Regular rate and rhythm  Neurologic:   Awake, alert, oriented x 3. No apparent focal neurological           defect.         Results for orders placed or performed in visit on 08/25/16  POCT HgB A1C  Result Value Ref Range   Hemoglobin A1C 9.5    Est. average glucose Bld gHb Est-mCnc 226        Assessment & Plan:     1. Uncontrolled type 2 diabetes mellitus with diabetic neuropathy, without long-term current use of insulin ([place]) Worsening partially due to worse eating habits and not exericising which he is going to work on. He did work on pioglitazone in the past which we will have  - POCT HgB A1C - pioglitazone (ACTOS) 45 MG tablet; Take 1 tablet (45 mg total) by mouth daily.  Dispense: 1 tablet;  Refill: 1 - amLODipine (NORVASC) 5 MG tablet; Take 1 tablet (5 mg total) by mouth daily.  Dispense: 90 tablet; Refill: 3  2. Essential (primary) hypertension Well controlled will decrease amlodipine to 5mg  daily.  - amLODipine (NORVASC) 5 MG tablet; Take 1 tablet (5 mg total) by mouth daily.  Dispense: 90 tablet; Refill: 3  Return in about 12 weeks (around 11/17/2016).       [name], MD  [facility]

## 2016-08-28 ENCOUNTER — Other Ambulatory Visit: Payer: Self-pay | Admitting: *Deleted

## 2016-08-28 ENCOUNTER — Telehealth: Payer: Self-pay | Admitting: Family Medicine

## 2016-08-28 MED ORDER — ACCU-CHEK AVIVA PLUS W/DEVICE KIT: PACK | 0 refills | Status: DC

## 2016-08-28 NOTE — Telephone Encounter (Signed)
[facility] requesting ACCU CHEK AVIVA PLUS KIT VIEW ONLY.

## 2016-08-28 NOTE — Telephone Encounter (Addendum)
Request has already been sent to provider for kit.

## 2016-08-28 NOTE — Telephone Encounter (Signed)
Pt states his glucose meter is broken.  Pt is requesting a Rx for a new glucose meter, test stripes and lancets sent to [facility].  CB#[phone]/MW

## 2016-09-03 ENCOUNTER — Encounter: Payer: Self-pay | Admitting: Physician Assistant

## 2016-09-03 ENCOUNTER — Ambulatory Visit (INDEPENDENT_AMBULATORY_CARE_PROVIDER_SITE_OTHER): Payer: Medicare Other | Admitting: Physician Assistant

## 2016-09-03 VITALS — BP 128/82 | HR 60 | Temp 97.4°F | Resp 16 | Wt 220.0 lb

## 2016-09-03 DIAGNOSIS — R079 Chest pain, unspecified: Secondary | ICD-10-CM

## 2016-09-03 NOTE — Patient Instructions (Signed)
Angina Pectoris Angina pectoris is a very bad feeling in the chest, neck, or arm. Your doctor may call it angina. There are four types of angina. Angina is caused by a lack of blood in the middle and thickest layer of the heart wall (myocardium). Angina may feel like a crushing or squeezing pain in the chest. It may feel like tightness or heavy pressure in the chest. Some people say it feels like gas, heartburn, or indigestion. Some people have symptoms other than pain. These include:  Shortness of breath.  Cold sweats.  Feeling sick to your stomach (nausea).  Feeling light-headed.  Many women have chest discomfort and some of the other symptoms. However, women often have different symptoms, such as:  Feeling tired (fatigue).  Feeling nervous for no reason.  Feeling weak for no reason.  Dizziness or fainting.  Women may have angina without any symptoms. Follow these instructions at home:  Take medicines only as told by your doctor.  Take care of other health issues as told by your doctor. These include: ? High blood pressure (hypertension). ? Diabetes.  Follow a heart-healthy diet. Your doctor can help you to choose healthy food options and make changes.  Talk to your doctor to learn more about healthy cooking methods and use them. These include: ? Roasting. ? Grilling. ? Broiling. ? Baking. ? Poaching. ? Steaming. ? Stir-frying.  Follow an exercise program approved by your doctor.  Keep a healthy weight. Lose weight as told by your doctor.  Rest when you are tired.  Learn to manage stress.  Do not use any tobacco, such as cigarettes, chewing tobacco, or electronic cigarettes. If you need help quitting, ask your doctor.  If you drink alcohol, and your doctor says it is okay, limit yourself to no more than 1 drink per day. One drink equals 12 ounces of beer, 5 ounces of wine, or 1 ounces of hard liquor.  Stop illegal drug use.  Keep all follow-up visits as told  by your doctor. This is important. Do not take these medicines unless your doctor says that you can:  Nonsteroidal anti-inflammatory drugs (NSAIDs). These include: ? Ibuprofen. ? Naproxen. ? Celecoxib.  Vitamin supplements that have vitamin A, vitamin E, or both.  Hormone therapy that contains estrogen with or without progestin.  Get help right away if:  You have pain in your chest, neck, arm, jaw, stomach, or back that: ? Lasts more than a few minutes. ? Comes back. ? Does not get better after you take medicine under your tongue (sublingual nitroglycerin).  You have any of these symptoms for no reason: ? Gas, heartburn, or indigestion. ? Sweating a lot. ? Shortness of breath or trouble breathing. ? Feeling sick to your stomach or throwing up. ? Feeling more tired than usual. ? Feeling nervous or worrying more than usual. ? Feeling weak. ? Diarrhea.  You are suddenly dizzy or light-headed.  You faint or pass out. These symptoms may be an emergency. Do not wait to see if the symptoms will go away. Get medical help right away. Call your local emergency services (911 in the [place] Do not drive yourself to the hospital. This information is not intended to replace advice given to you by your health care provider. Make sure you discuss any questions you have with your health care provider. Document Released: 01/07/2008 Document Revised: 12/27/2015 Document Reviewed: 11/22/2013 Elsevier Interactive Patient Education  2017 [employer]  

## 2016-09-03 NOTE — Progress Notes (Signed)
Patient: Stuart Henry Male    DOB: [birth year]   76 y.o.   MRN: [id number] Visit Date: 09/03/2016  Today's Provider: Trinna Post, PA-C   Chief Complaint  Patient presents with  . Chest Pain    Started Monday   Subjective:    Chest Pain   This is a new problem. The current episode started in the past 7 days. The problem has been gradually worsening. The quality of the pain is described as dull. The pain does not radiate. Associated symptoms include exertional chest pressure. Pertinent negatives include no diaphoresis, dizziness, fever, headaches, hemoptysis, irregular heartbeat, lower extremity edema, malaise/fatigue, nausea, near-syncope, numbness, palpitations, shortness of breath or sputum production. The pain is aggravated by nothing.   Patient is a 76 y/o man with uncontrolled Type Ii Dm, HTN, HLD history presenting today with chest pain with exertion x 3 days. He reports that in 1996 he had an episode of chest pain after working out and he underwent cardiac catheterization, which he reports showed minimal atherosclerosis. Has never had an echocardiogram. He reports that he stopped working out since Thanksgiving because it got cold, and he has recently started working out again. He reports he was doing a two mile loop around his home and while he was going up an incline, his chest began to hurt. This resolved with rest. He did not have radiation of pain, SOB, diaphoresis, N/V at this time. He experienced more episodes of chest pain with exertion while at the gym that also resolved with rest. He does not have chest pain at rest. He does not have problems breathing. He denies acid reflux, did have hiatal hernia surgery in 2003. Denies anxiety or panic attacks.  No Known Allergies   Current Outpatient Prescriptions:  .  ACCU-CHEK AVIVA PLUS test strip, Check blood sugar once  daily, Disp: 100 each, Rfl: 4 .  allopurinol (ZYLOPRIM) 100 MG tablet, TAKE 1 TABLET DAILY, Disp: 90  tablet, Rfl: 4 .  amLODipine (NORVASC) 5 MG tablet, Take 1 tablet (5 mg total) by mouth daily., Disp: 90 tablet, Rfl: 3 .  aspirin 81 MG tablet, Take 81 mg by mouth daily., Disp: , Rfl:  .  benazepril-hydrochlorthiazide (LOTENSIN HCT) 20-12.5 MG tablet, TAKE 1 TABLET DAILY, Disp: 90 tablet, Rfl: 3 .  Blood Glucose Monitoring Suppl (ACCU-CHEK AVIVA PLUS) w/Device KIT, Use to check blood sugar daily as directed, Disp: 1 kit, Rfl: 0 .  empagliflozin (JARDIANCE) 25 MG TABS tablet, Take 25 mg by mouth daily., Disp: 30 tablet, Rfl: 6 .  gabapentin (NEURONTIN) 300 MG capsule, TAKE ONE CAPSULE BY MOUTH AT BEDTIME FOR NEUROPATHY, Disp: 90 capsule, Rfl: 4 .  glipiZIDE (GLUCOTROL XL) 10 MG 24 hr tablet, Take 1 tablet (10 mg total) by mouth daily with breakfast., Disp: 90 tablet, Rfl: 3 .  ibuprofen (ADVIL,MOTRIN) 200 MG tablet, Take 200 mg by mouth every 6 (six) hours as needed., Disp: , Rfl:  .  metFORMIN (GLUCOPHAGE) 1000 MG tablet, TAKE 1 TABLET TWICE A DAY WITH A MEAL, Disp: 180 tablet, Rfl: 4 .  pioglitazone (ACTOS) 45 MG tablet, Take 1 tablet (45 mg total) by mouth daily., Disp: 1 tablet, Rfl: 1 .  repaglinide (PRANDIN) 1 MG tablet, TAKE ONE TABLET BY MOUTH UP TO 3 TIMES DAILY BEFORE MEALS FOR BLOOD SUGAR, Disp: 30 tablet, Rfl: 12 .  fluticasone (FLONASE) 50 MCG/ACT nasal spray, Place 2 sprays into both nostrils daily., Disp: 16 g, Rfl: 6  Review of Systems  Constitutional: Positive for fatigue. Negative for activity change, appetite change, chills, diaphoresis, fever, malaise/fatigue and unexpected weight change.  HENT: Positive for congestion.   Respiratory: Negative.  Negative for hemoptysis, sputum production and shortness of breath.   Cardiovascular: Positive for chest pain. Negative for palpitations, leg swelling and near-syncope.  Gastrointestinal: Negative.  Negative for nausea.  Musculoskeletal: Negative.   Neurological: Negative for dizziness, light-headedness, numbness and headaches.     Social History  Substance Use Topics  . Smoking status: Former Smoker    Packs/day: 1.00    Years: 5.00    Types: Cigarettes    Quit date: 08/06/1972  . Smokeless tobacco: Never Used  . Alcohol use No   Objective:   BP 128/82 (BP Location: Left Arm, Patient Position: Sitting, Cuff Size: Normal)   Pulse 60   Temp 97.4 F (36.3 C) (Oral)   Resp 16   Wt 220 lb (99.8 kg)   BMI 26.43 kg/m   Physical Exam  Constitutional: He is oriented to person, place, and time. He appears well-developed and well-nourished. No distress.  HENT:  Right Ear: Tympanic membrane and external ear normal.  Left Ear: Tympanic membrane and external ear normal.  Nose: Right sinus exhibits no maxillary sinus tenderness and no frontal sinus tenderness. Left sinus exhibits no maxillary sinus tenderness and no frontal sinus tenderness.  Mouth/Throat: Uvula is midline, oropharynx is clear and moist and mucous membranes are normal. No oropharyngeal exudate.  Eyes: Conjunctivae are normal. Right eye exhibits no discharge. Left eye exhibits no discharge.  Neck: Normal range of motion. Neck supple.  Cardiovascular: Normal rate, regular rhythm, normal heart sounds and intact distal pulses.  Exam reveals no gallop and no friction rub.   No murmur heard. Pulmonary/Chest: Effort normal and breath sounds normal. No respiratory distress. He has no wheezes. He has no rales. He exhibits no tenderness.  Lymphadenopathy:    He has no cervical adenopathy.  Neurological: He is alert and oriented to person, place, and time.  Skin: Skin is warm and dry. He is not diaphoretic.  Psychiatric: He has a normal mood and affect. His behavior is normal.        Assessment & Plan:     1. Chest pain, unspecified type  EKG in office today is normal, stable from last year, with no evidence of ischemia. Symptoms concerning for stable angina. Will refer to cardiology for further evaluation.  - EKG 12-Lead - Ambulatory referral to  Cardiology  Return if symptoms worsen or fail to improve.   Patient Instructions  Angina Pectoris Angina pectoris is a very bad feeling in the chest, neck, or arm. Your doctor may call it angina. There are four types of angina. Angina is caused by a lack of blood in the middle and thickest layer of the heart wall (myocardium). Angina may feel like a crushing or squeezing pain in the chest. It may feel like tightness or heavy pressure in the chest. Some people say it feels like gas, heartburn, or indigestion. Some people have symptoms other than pain. These include:  Shortness of breath.  Cold sweats.  Feeling sick to your stomach (nausea).  Feeling light-headed. Many women have chest discomfort and some of the other symptoms. However, women often have different symptoms, such as:  Feeling tired (fatigue).  Feeling nervous for no reason.  Feeling weak for no reason.  Dizziness or fainting. Women may have angina without any symptoms. Follow these instructions at home:  Take  medicines only as told by your doctor.  Take care of other health issues as told by your doctor. These include:  High blood pressure (hypertension).  Diabetes.  Follow a heart-healthy diet. Your doctor can help you to choose healthy food options and make changes.  Talk to your doctor to learn more about healthy cooking methods and use them. These include:  Roasting.  Grilling.  Broiling.  Baking.  Poaching.  Steaming.  Stir-frying.  Follow an exercise program approved by your doctor.  Keep a healthy weight. Lose weight as told by your doctor.  Rest when you are tired.  Learn to manage stress.  Do not use any tobacco, such as cigarettes, chewing tobacco, or electronic cigarettes. If you need help quitting, ask your doctor.  If you drink alcohol, and your doctor says it is okay, limit yourself to no more than 1 drink per day. One drink equals 12 ounces of beer, 5 ounces of wine, or 1  ounces of hard liquor.  Stop illegal drug use.  Keep all follow-up visits as told by your doctor. This is important. Do not take these medicines unless your doctor says that you can:  Nonsteroidal anti-inflammatory drugs (NSAIDs). These include:  Ibuprofen.  Naproxen.  Celecoxib.  Vitamin supplements that have vitamin A, vitamin E, or both.  Hormone therapy that contains estrogen with or without progestin. Get help right away if:  You have pain in your chest, neck, arm, jaw, stomach, or back that:  Lasts more than a few minutes.  Comes back.  Does not get better after you take medicine under your tongue (sublingual nitroglycerin).  You have any of these symptoms for no reason:  Gas, heartburn, or indigestion.  Sweating a lot.  Shortness of breath or trouble breathing.  Feeling sick to your stomach or throwing up.  Feeling more tired than usual.  Feeling nervous or worrying more than usual.  Feeling weak.  Diarrhea.  You are suddenly dizzy or light-headed.  You faint or pass out. These symptoms may be an emergency. Do not wait to see if the symptoms will go away. Get medical help right away. Call your local emergency services (911 in the [place] Do not drive yourself to the hospital.  This information is not intended to replace advice given to you by your health care provider. Make sure you discuss any questions you have with your health care provider. Document Released: 01/07/2008 Document Revised: 12/27/2015 Document Reviewed: 11/22/2013 Elsevier Interactive Patient Education  2017 [employer].   The entirety of the information documented in the History of Present Illness, Review of Systems and Physical Exam were personally obtained by me. Portions of this information were initially documented by Ashley Royalty, CMA and reviewed by me for thoroughness and accuracy.       Trinna Post, PA-C  [facility]

## 2016-09-10 ENCOUNTER — Telehealth: Payer: Self-pay | Admitting: *Deleted

## 2016-09-10 ENCOUNTER — Encounter: Payer: Self-pay | Admitting: Internal Medicine

## 2016-09-10 ENCOUNTER — Ambulatory Visit (INDEPENDENT_AMBULATORY_CARE_PROVIDER_SITE_OTHER): Payer: Medicare Other | Admitting: Internal Medicine

## 2016-09-10 ENCOUNTER — Ambulatory Visit
Admission: RE | Admit: 2016-09-10 | Discharge: 2016-09-10 | Disposition: A | Discharge status: Home / Self Care | Payer: Medicare Other | Source: Ambulatory Visit | Attending: Internal Medicine | Admitting: Internal Medicine

## 2016-09-10 VITALS — BP 98/72 | HR 74 | Ht 76.0 in | Wt 216.5 lb

## 2016-09-10 DIAGNOSIS — Z01810 Encounter for preprocedural cardiovascular examination: Secondary | ICD-10-CM | POA: Insufficient documentation

## 2016-09-10 DIAGNOSIS — Z01818 Encounter for other preprocedural examination: Secondary | ICD-10-CM

## 2016-09-10 DIAGNOSIS — I10 Essential (primary) hypertension: Secondary | ICD-10-CM | POA: Diagnosis not present

## 2016-09-10 DIAGNOSIS — I209 Angina pectoris, unspecified: Secondary | ICD-10-CM

## 2016-09-10 DIAGNOSIS — E782 Mixed hyperlipidemia: Secondary | ICD-10-CM

## 2016-09-10 MED ORDER — NITROGLYCERIN 0.4 MG SL SUBL: 0.4000 mg | SUBLINGUAL_TABLET | SUBLINGUAL | 2 refills | Status: DC | PRN

## 2016-09-10 NOTE — Progress Notes (Signed)
New Outpatient Visit Date: 09/10/2016  Referring Provider: Trinna Post, PA-C [address] [place], [place] [address]  Chief Complaint: Chest pain  HPI:  Stuart Henry is a 76 y.o. year-old male with history of diabetes mellitus and hypertension, who has been referred by Ms. Pollak for evaluation of chest pain. The patient reports exertional chest pain over the last 1.5 weeks. He notes that the discomfort occurs when walking up a steep grade or exercising on the treadmill. He describes the pain as a soreness in the center of the chest with a maximum intensity of 5/10. The pain does not radiate and is associated with shortness of breath. He denies nausea, palpitations, lightheadedness, and diaphoresis. The pain resolves within a minute after stopping to rest. If he begins exerting himself again, the pain will recur. The patient reports having undergone a cardiac catheterization in 1996. At the time he was told that his heart enzymes were slightly elevated. Per his report, no significant CAD was identified.  Stuart Henry believes that his discomfort may be partly due to medications. He was switched from Actos to Jardiance this fall due to suboptimally controlled blood sugars. He initially had a good response but subsequently had persistently elevated blood glucose after Thanksgiving. Actos was restarted a few weeks ago, after which the patient began having the aforementioned exertional chest pain. He stopped taking the medication few days ago and has not had any further episodes of chest discomfort. However, he has also not perform any strenuous activities during this time. He denies chest pain at rest.  --------------------------------------------------------------------------------------------------  Cardiovascular History & Procedures: Cardiovascular Problems:  Angina  Risk Factors:  Diabetes mellitus, hypertension, male gender, and age  Cath/PCI:  LHC (1996): reportedly showed  minimal atherosclerosis.  CV Surgery:  None  EP Procedures and Devices:  None  Non-Invasive Evaluation(s):  None  Recent CV Pertinent Labs: Lab Results  Component Value Date   CHOL 179 03/28/2016   HDL 33 (L) 03/28/2016   LDLCALC 97 03/28/2016   TRIG 246 (H) 03/28/2016   CHOLHDL 5.4 (H) 03/28/2016   K 4.5 05/05/2016   BUN 22 05/05/2016   CREATININE 1.32 (H) 05/05/2016    --------------------------------------------------------------------------------------------------  Past Medical History:  Diagnosis Date  . Diabetes mellitus without complication ([place])   . Hypertension     Past Surgical History:  Procedure Laterality Date  . [place], No stent  . COLONOSCOPY WITH PROPOFOL N/A 02/09/2015   Procedure: COLONOSCOPY WITH PROPOFOL;  Surgeon: Manya Silvas, MD;  Location: [facility] ENDOSCOPY;  Service: Endoscopy;  Laterality: N/A;  . KNEE SURGERY  2010   to correct a severed patella tendon  . TOTAL KNEE ARTHROPLASTY  2010    Outpatient Encounter Prescriptions as of 09/10/2016  Medication Sig  . ACCU-CHEK AVIVA PLUS test strip Check blood sugar once  daily  . allopurinol (ZYLOPRIM) 100 MG tablet TAKE 1 TABLET DAILY  . aspirin 81 MG tablet Take 81 mg by mouth daily.  . benazepril-hydrochlorthiazide (LOTENSIN HCT) 20-12.5 MG tablet TAKE 1 TABLET DAILY  . Blood Glucose Monitoring Suppl (ACCU-CHEK AVIVA PLUS) w/Device KIT Use to check blood sugar daily as directed  . empagliflozin (JARDIANCE) 25 MG TABS tablet Take 25 mg by mouth daily.  . fluticasone (FLONASE) 50 MCG/ACT nasal spray Place 2 sprays into both nostrils daily.  Marland Kitchen gabapentin (NEURONTIN) 300 MG capsule TAKE ONE CAPSULE BY MOUTH AT BEDTIME FOR NEUROPATHY  . glipiZIDE (GLUCOTROL XL) 10 MG  24 hr tablet Take 1 tablet (10 mg total) by mouth daily with breakfast.  . ibuprofen (ADVIL,MOTRIN) 200 MG tablet Take 200 mg by mouth every 6 (six) hours as needed.  . metFORMIN (GLUCOPHAGE) 1000  MG tablet TAKE 1 TABLET TWICE A DAY WITH A MEAL  . repaglinide (PRANDIN) 1 MG tablet TAKE ONE TABLET BY MOUTH UP TO 3 TIMES DAILY BEFORE MEALS FOR BLOOD SUGAR  . pioglitazone (ACTOS) 45 MG tablet Take 1 tablet (45 mg total) by mouth daily. (Patient not taking: Reported on 09/10/2016)  . [DISCONTINUED] amLODipine (NORVASC) 5 MG tablet Take 1 tablet (5 mg total) by mouth daily. (Patient not taking: Reported on 09/10/2016)   No facility-administered encounter medications on file as of 09/10/2016.     Allergies: Patient has no known allergies.  Social History   Social History  . Marital status: Married    Spouse name: N/A  . Number of children: N/A  . Years of education: N/A   Occupational History  . Retired    Social History Main Topics  . Smoking status: Former Smoker    Packs/day: 1.00    Years: 5.00    Types: Cigarettes    Quit date: 08/06/1972  . Smokeless tobacco: Never Used  . Alcohol use No  . Drug use: No  . Sexual activity: Not on file   Other Topics Concern  . Not on file   Social History Narrative  . No narrative on file    Family History  Problem Relation Age of Onset  . Dementia Mother   . Diabetes Mother   . Cancer Father   . Diabetes Sister   . Diabetes Brother   . Heart disease Other     Review of Systems: Occasional numbness and pin-like pain in feet/toes.  Often at night or when still.  A 12-system review of systems was performed and was negative except as noted in the HPI.  --------------------------------------------------------------------------------------------------  Physical Exam: BP 98/72 (BP Location: Left Arm, Patient Position: Sitting, Cuff Size: Normal)   Pulse 74   Ht _0  (1.93 m)   Wt 216 lb 8 oz (98.2 kg)   BMI 26.35 kg/m   General:  Well-developed, well-nourished man seated comfortably in the exam room. He is accompanied by his wife. HEENT: No conjunctival pallor or scleral icterus.  Moist mucous membranes.  OP clear. Neck:  Supple without lymphadenopathy, thyromegaly, JVD, or HJR.  No carotid bruit. Lungs: Normal work of breathing.  Clear to auscultation bilaterally without wheezes or crackles. Heart: Regular rate and rhythm without murmurs, rubs, or gallops.  Non-displaced PMI. Abd: Bowel sounds present.  Soft, NT/ND without hepatosplenomegaly Ext: No lower extremity edema.  Radial, PT, and DP pulses are 2+ bilaterally Skin: warm and dry without rash Neuro: CNIII-XII intact.  Strength and fine-touch sensation intact in upper and lower extremities bilaterally. Psych: Normal mood and affect.  EKG:  Normal sinus rhythm with inferior Q waves. No significant change from prior tracings as far back as 02/23/15 (I have personally reviewed today's and multiple prior tracings).  Lab Results  Component Value Date   WBC 5.2 03/28/2016   HGB 14.2 03/04/2013   HCT 40.8 03/28/2016   MCV 89 03/28/2016   PLT 226 03/28/2016    Lab Results  Component Value Date   NA 137 05/05/2016   K 4.5 05/05/2016   CL 100 05/05/2016   CO2 21 05/05/2016   BUN 22 05/05/2016   CREATININE 1.32 (H) 05/05/2016   GLUCOSE  193 (H) 05/05/2016   ALT 14 03/28/2016    Lab Results  Component Value Date   CHOL 179 03/28/2016   HDL 33 (L) 03/28/2016   LDLCALC 97 03/28/2016   TRIG 246 (H) 03/28/2016   CHOLHDL 5.4 (H) 03/28/2016    --------------------------------------------------------------------------------------------------  ASSESSMENT AND PLAN: Angina pectoris Patient's exertional chest pain is concerning for angina. Given that it is new, it qualifies as unstable angina. However, Stuart Henry has not had any discomfort at rest or prolonged episodes that would necessitate hospitalization and urgent catheterization. We have discussed further evaluation options, including noninvasive testing and cardiac catheterization. Given the atypical nature of his chest pain as well as multiple cardiac risk factors (diabetes, hypertension, male gender,  and age), we have agreed to proceed with cardiac catheterization. I have reviewed the risks, indications, and alternatives to cardiac catheterization, possible angioplasty, and stenting with the patient. Risks include but are not limited to bleeding, infection, vascular injury, stroke, myocardial infection, arrhythmia, kidney injury, radiation-related injury in the case of prolonged fluoroscopy use, emergency cardiac surgery, and death. The patient understands the risks of serious complication is 1-2 in 7614 with diagnostic cardiac cath and 1-2% or less with angioplasty/stenting. We will arrange this to be done within the next week. I will not make any medication changes today. We will check routine precatheterization labs including CBC, BMP, and PT/INR today. We will also perform a preop chest radiograph. I provided the patient with a prescription for sublingual nitroglycerin to be taken as needed. If his symptoms worsen before catheterization, he should seek immediate medical attention.  Essential hypertension Blood pressure is low normal today. We will not add any additional agents at this time.  Hyperlipidemia Most recent lipid panel in 03/2016 was notable for elevated triglycerides. LDL was 97. Given his history of diabetes, it would be reasonable to consider a statin. If his catheterization demonstrates CAD, we will certainly need to initiate this.  Follow-up: To be determined based on catheterization.  Nelva Bush, MD 09/10/2016 10:48 AM

## 2016-09-10 NOTE — Telephone Encounter (Signed)
Dr End s/w patient concerning location of heart cath. Patient agreeable to have Left Heart cath at [facility] on 09/16/16.  Left heart cath scheduled for 09/16/16 at 0730 am with scheduling.

## 2016-09-10 NOTE — Patient Instructions (Addendum)
Medication Instructions:  .Your physician has recommended you make the following change in your medication:  1- TAKE Nitroglycerin- Place 1 tablet (0.4 mg total) under the tongue every 5 (five) minutes as needed for chest pain.   Labwork: Your physician recommends that you return for lab work in: [place].   Testing/Procedures: A chest x-ray takes a picture of the organs and structures inside the chest, including the heart, lungs, and blood vessels. This test can show several things, including, whether the heart is enlarges; whether fluid is building up in the lungs; and whether pacemaker / defibrillator leads are still in place. - TODAY AT [place].   Your physician has requested that you have a LEFT HEART cardiac catheterization. Cardiac catheterization is used to diagnose and/or treat various heart conditions. Doctors may recommend this procedure for a number of different reasons. The most common reason is to evaluate chest pain. Chest pain can be a symptom of coronary artery disease (CAD), and cardiac catheterization can show whether plaque is narrowing or blocking your heart's arteries. This procedure is also used to evaluate the valves, as well as measure the blood flow and oxygen levels in different parts of your heart. For further information please visit [email or web address]. Please follow instruction sheet, as given.  You are scheduled for a LEFT Cardiac Catheterization on ___________ with Dr. Harrell Gave End.  Please arrive at the [facility] at ______ on the day of your procedure.  1. Nothing to eat or drink after midnight. 2. You should take your medication as usual with a sip of water; this includes your aspirin and Plavix / Effient / Brilinta. 3. Do not take your BENAZEPRIL/HCTZ, the morning of the procedure. 4. If you are diabetic, do NOT take your diabetic medication (Glipizide, Jardiance, Actos, Repaglinide)  or insulin the morning of the procedure.  These will be given to you after the procedure. 5. If you are taking any medication with Glucophage (Metformin) in it, do NOT take your dose the day before or the day of your procedure.  You will be instructed when to re-start your medication. 6. You will need bloodwork and a chest xray prior to the procedure.  You do not have to be fasting.  7. Plan for one night stay - bring personal belongings (i.e. Toothpaste, toothbrush, etc.). 8. Bring a current list of your medications and current insurance cards. 9. Must have a responsible person to drive you home. 10. Someone must be with you for the first 24 hours after you arrive home. 11. Please wear clothes that are easy to get on and off and wear slip-on shoes.   * Special note:  Every effort is made to have your procedure done on time.  Occasionally there are emergencies that present themselves at the hospital that may cause delays.  Please be patient if a delay does occur.  If you have any questions after you get home, please call the office at [phone].    Follow-Up: Your physician recommends that you schedule a follow-up appointment upon leaving the hospital for post-op visit.   If you need a refill on your cardiac medications before your next appointment, please call your pharmacy. . Angiogram An angiogram, also called angiography, is a procedure used to look at the blood vessels. In this procedure, dye is injected through a long, thin tube (catheter) into an artery. X-rays are then taken. The X-rays will show if there is a  blockage or problem in a blood vessel. Tell a health care provider about:  Any allergies you have, including allergies to shellfish or contrast dye.  All medicines you are taking, including vitamins, herbs, eye drops, creams, and over-the-counter medicines.  Any problems you or family members have had with anesthetic medicines.  Any blood disorders you have.  Any  surgeries you have had.  Any previous kidney problems or failure you have had.  Any medical conditions you have.  Possibility of pregnancy, if this applies. What are the risks? Generally, an angiogram is a safe procedure. However, as with any procedure, problems can occur. Possible problems include:  Injury to the blood vessels, including rupture or bleeding.  Infection or bruising at the catheter site.  Allergic reaction to the dye or contrast used.  Kidney damage from the dye or contrast used.  Blood clots that can lead to a stroke or heart attack. What happens before the procedure?  Do not eat or drink after midnight on the night before the procedure, or as directed by your health care provider.  Ask your health care provider if you may drink enough water to take any needed medicines the morning of the procedure. What happens during the procedure?  You may be given a medicine to help you relax (sedative) before and during the procedure. This medicine is given through an IV access tube that is inserted into one of your veins.  The area where the catheter will be inserted will be washed and shaved. This is usually done in the groin but may be done in the fold of your arm (near your elbow) or in the wrist.  A medicine will be given to numb the area where the catheter will be inserted (local anesthetic).  The catheter will be inserted with a guide wire into an artery. The catheter is guided by using a type of X-ray (fluoroscopy) to the blood vessel being examined.  Dye is then injected into the catheter, and X-rays are taken. The dye helps to show where any narrowing or blockages are located. What happens after the procedure?  If the procedure is done through the leg, you will be kept in bed lying flat for several hours. You will be instructed to not bend or cross your legs.  The insertion site will be checked frequently.  The pulse in your feet or wrist will be checked  frequently.  Additional blood tests, X-rays, and electrocardiography may be done.  You may need to stay in the hospital overnight for observation. This information is not intended to replace advice given to you by your health care provider. Make sure you discuss any questions you have with your health care provider. Document Released: 04/30/2005 Document Revised: 01/02/2016 Document Reviewed: 12/22/2012 Elsevier Interactive Patient Education  2017 [employer].

## 2016-09-10 NOTE — Telephone Encounter (Signed)
S/w patient. Patient verbalized understanding to arrive at 06:30 am on 09/16/16 at the [facility] entrance for his Left heart cath. Patient verbalized understanding to follow preoperative instructions as listed on his AVS from office visit today. Patient had no further questions.

## 2016-09-11 LAB — CBC WITH DIFFERENTIAL/PLATELET
BASOS ABS: 0 10*3/uL (ref 0.0–0.2)
Basos: 1 %
EOS (ABSOLUTE): 0.1 10*3/uL (ref 0.0–0.4)
Eos: 2 %
Hematocrit: 44.1 % (ref 37.5–51.0)
Hemoglobin: 15.1 g/dL (ref 13.0–17.7)
Immature Grans (Abs): 0 10*3/uL (ref 0.0–0.1)
Immature Granulocytes: 0 %
LYMPHS ABS: 0.8 10*3/uL (ref 0.7–3.1)
Lymphs: 14 %
MCH: 29.7 pg (ref 26.6–33.0)
MCHC: 34.2 g/dL (ref 31.5–35.7)
MCV: 87 fL (ref 79–97)
MONOS ABS: 0.4 10*3/uL (ref 0.1–0.9)
Monocytes: 7 %
NEUTROS ABS: 4.6 10*3/uL (ref 1.4–7.0)
Neutrophils: 76 %
Platelets: 232 10*3/uL (ref 150–379)
RBC: 5.08 x10E6/uL (ref 4.14–5.80)
RDW: 14.2 % (ref 12.3–15.4)
WBC: 5.9 10*3/uL (ref 3.4–10.8)

## 2016-09-11 LAB — BASIC METABOLIC PANEL
BUN / CREAT RATIO: 21 (ref 10–24)
BUN: 27 mg/dL (ref 8–27)
CHLORIDE: 97 mmol/L (ref 96–106)
CO2: 22 mmol/L (ref 18–29)
Calcium: 10.3 mg/dL — ABNORMAL HIGH (ref 8.6–10.2)
Creatinine, Ser: 1.29 mg/dL — ABNORMAL HIGH (ref 0.76–1.27)
GFR calc Af Amer: 63 mL/min/{1.73_m2} (ref >59)
GFR calc non Af Amer: 55 mL/min/{1.73_m2} — ABNORMAL LOW (ref >59)
Glucose: 156 mg/dL — ABNORMAL HIGH (ref 65–99)
POTASSIUM: 4.9 mmol/L (ref 3.5–5.2)
SODIUM: 138 mmol/L (ref 134–144)

## 2016-09-11 LAB — PROTIME-INR
INR: 1.1 (ref 0.8–1.2)
PROTHROMBIN TIME: 11.2 s (ref 9.1–12.0)

## 2016-09-15 ENCOUNTER — Telehealth: Payer: Self-pay | Admitting: Internal Medicine

## 2016-09-15 ENCOUNTER — Other Ambulatory Visit: Payer: Self-pay | Admitting: Internal Medicine

## 2016-09-15 DIAGNOSIS — R079 Chest pain, unspecified: Secondary | ICD-10-CM | POA: Diagnosis present

## 2016-09-15 DIAGNOSIS — I200 Unstable angina: Secondary | ICD-10-CM

## 2016-09-15 NOTE — Telephone Encounter (Signed)
Pt states he took his Metformin this morning, before he realized he shouldn't have, due to his cath scheduled tomorrow. Please advise.

## 2016-09-15 NOTE — Telephone Encounter (Signed)
Spoke with patient and he took his morning metformin. Let him know that this should be OK as long as he doesn't take anymore today. He verbalized understanding and has no further questions at this time.

## 2016-09-16 ENCOUNTER — Ambulatory Visit
Admission: RE | Admit: 2016-09-16 | Discharge: 2016-09-16 | Disposition: A | Discharge status: Home / Self Care | Payer: Medicare Other | Source: Ambulatory Visit | Attending: Internal Medicine | Admitting: Internal Medicine

## 2016-09-16 ENCOUNTER — Telehealth: Payer: Self-pay | Admitting: *Deleted

## 2016-09-16 ENCOUNTER — Encounter
Admission: RE | Disposition: A | Discharge status: Home / Self Care | Payer: Self-pay | Source: Ambulatory Visit | Attending: Internal Medicine

## 2016-09-16 DIAGNOSIS — Z79899 Other long term (current) drug therapy: Secondary | ICD-10-CM

## 2016-09-16 DIAGNOSIS — Z818 Family history of other mental and behavioral disorders: Secondary | ICD-10-CM | POA: Diagnosis not present

## 2016-09-16 DIAGNOSIS — I10 Essential (primary) hypertension: Secondary | ICD-10-CM | POA: Diagnosis not present

## 2016-09-16 DIAGNOSIS — IMO0001 Reserved for inherently not codable concepts without codable children: Secondary | ICD-10-CM

## 2016-09-16 DIAGNOSIS — R079 Chest pain, unspecified: Secondary | ICD-10-CM | POA: Diagnosis present

## 2016-09-16 DIAGNOSIS — I200 Unstable angina: Secondary | ICD-10-CM

## 2016-09-16 DIAGNOSIS — E119 Type 2 diabetes mellitus without complications: Secondary | ICD-10-CM | POA: Insufficient documentation

## 2016-09-16 DIAGNOSIS — Z87891 Personal history of nicotine dependence: Secondary | ICD-10-CM | POA: Diagnosis not present

## 2016-09-16 DIAGNOSIS — Z96659 Presence of unspecified artificial knee joint: Secondary | ICD-10-CM | POA: Diagnosis not present

## 2016-09-16 DIAGNOSIS — E1165 Type 2 diabetes mellitus with hyperglycemia: Secondary | ICD-10-CM

## 2016-09-16 DIAGNOSIS — Z7982 Long term (current) use of aspirin: Secondary | ICD-10-CM | POA: Insufficient documentation

## 2016-09-16 DIAGNOSIS — Z809 Family history of malignant neoplasm, unspecified: Secondary | ICD-10-CM | POA: Diagnosis not present

## 2016-09-16 DIAGNOSIS — I25110 Atherosclerotic heart disease of native coronary artery with unstable angina pectoris: Secondary | ICD-10-CM | POA: Diagnosis not present

## 2016-09-16 DIAGNOSIS — I2510 Atherosclerotic heart disease of native coronary artery without angina pectoris: Secondary | ICD-10-CM

## 2016-09-16 DIAGNOSIS — Z8249 Family history of ischemic heart disease and other diseases of the circulatory system: Secondary | ICD-10-CM | POA: Diagnosis not present

## 2016-09-16 DIAGNOSIS — Z833 Family history of diabetes mellitus: Secondary | ICD-10-CM | POA: Diagnosis not present

## 2016-09-16 DIAGNOSIS — I25119 Atherosclerotic heart disease of native coronary artery with unspecified angina pectoris: Secondary | ICD-10-CM

## 2016-09-16 DIAGNOSIS — Z7984 Long term (current) use of oral hypoglycemic drugs: Secondary | ICD-10-CM | POA: Insufficient documentation

## 2016-09-16 HISTORY — DX: Atherosclerotic heart disease of native coronary artery without angina pectoris: I25.10

## 2016-09-16 HISTORY — PX: LEFT HEART CATH AND CORONARY ANGIOGRAPHY: CATH118249

## 2016-09-16 LAB — GLUCOSE, CAPILLARY: Glucose-Capillary: 234 mg/dL — ABNORMAL HIGH (ref 65–99)

## 2016-09-16 SURGERY — LEFT HEART CATH AND CORONARY ANGIOGRAPHY
Anesthesia: Moderate Sedation | Laterality: Left

## 2016-09-16 MED ORDER — SODIUM CHLORIDE 0.9 % WEIGHT BASED INFUSION: 1.0000 mL/kg/h | INTRAVENOUS | Status: DC

## 2016-09-16 MED ORDER — HEPARIN (PORCINE) IN NACL 2-0.9 UNIT/ML-% IJ SOLN
INTRAMUSCULAR | Status: AC
  Filled 2016-09-16: qty 500

## 2016-09-16 MED ORDER — SODIUM CHLORIDE 0.9 % IV SOLN
250.0000 mL | INTRAVENOUS | Status: DC | PRN
  Administered 2016-09-16: 250 mL via INTRAVENOUS

## 2016-09-16 MED ORDER — VERAPAMIL HCL 2.5 MG/ML IV SOLN
INTRAVENOUS | Status: AC
  Filled 2016-09-16: qty 2

## 2016-09-16 MED ORDER — HEPARIN SODIUM (PORCINE) 1000 UNIT/ML IJ SOLN
INTRAMUSCULAR | Status: DC | PRN
  Administered 2016-09-16: 5000 [IU] via INTRAVENOUS

## 2016-09-16 MED ORDER — ASPIRIN 81 MG PO CHEW
CHEWABLE_TABLET | ORAL | Status: AC
  Filled 2016-09-16: qty 1

## 2016-09-16 MED ORDER — MIDAZOLAM HCL 2 MG/2ML IJ SOLN
INTRAMUSCULAR | Status: DC | PRN
  Administered 2016-09-16: 1 mg via INTRAVENOUS

## 2016-09-16 MED ORDER — ATORVASTATIN CALCIUM 40 MG PO TABS: 40.0000 mg | ORAL_TABLET | Freq: Every day | ORAL | 5 refills | Status: DC

## 2016-09-16 MED ORDER — ASPIRIN 81 MG PO CHEW
81.0000 mg | CHEWABLE_TABLET | ORAL | Status: AC
  Administered 2016-09-16: 81 mg via ORAL

## 2016-09-16 MED ORDER — SODIUM CHLORIDE 0.9 % WEIGHT BASED INFUSION: 3.0000 mL/kg/h | INTRAVENOUS | Status: DC

## 2016-09-16 MED ORDER — CEFAZOLIN IN D5W 1 GM/50ML IV SOLN: 1.0000 g | Freq: Once | INTRAVENOUS | Status: DC

## 2016-09-16 MED ORDER — ISOSORBIDE MONONITRATE ER 30 MG PO TB24: 30.0000 mg | ORAL_TABLET | Freq: Every day | ORAL | 5 refills | Status: DC

## 2016-09-16 MED ORDER — VERAPAMIL HCL 2.5 MG/ML IV SOLN
INTRAVENOUS | Status: DC | PRN
  Administered 2016-09-16: 2.5 mg via INTRA_ARTERIAL

## 2016-09-16 MED ORDER — MIDAZOLAM HCL 2 MG/2ML IJ SOLN
INTRAMUSCULAR | Status: AC
  Filled 2016-09-16: qty 2

## 2016-09-16 MED ORDER — HEPARIN SODIUM (PORCINE) 1000 UNIT/ML IJ SOLN
INTRAMUSCULAR | Status: AC
  Filled 2016-09-16: qty 1

## 2016-09-16 MED ORDER — SODIUM CHLORIDE 0.9% FLUSH
3.0000 mL | INTRAVENOUS | Status: DC | PRN
  Administered 2016-09-16: 3 mL via INTRAVENOUS
  Filled 2016-09-16: qty 3

## 2016-09-16 MED ORDER — FENTANYL CITRATE (PF) 100 MCG/2ML IJ SOLN
INTRAMUSCULAR | Status: DC | PRN
  Administered 2016-09-16: 50 ug via INTRAVENOUS

## 2016-09-16 MED ORDER — SODIUM CHLORIDE 0.9% FLUSH: 3.0000 mL | Freq: Two times a day (BID) | INTRAVENOUS | Status: DC

## 2016-09-16 MED ORDER — FENTANYL CITRATE (PF) 100 MCG/2ML IJ SOLN
INTRAMUSCULAR | Status: AC
  Filled 2016-09-16: qty 2

## 2016-09-16 SURGICAL SUPPLY — 8 items
CATH 5F 110X4 TIG (CATHETERS) ×3 IMPLANT
CATH 5FR PIGTAIL DIAGNOSTIC (CATHETERS) ×3 IMPLANT
DEVICE RAD TR BAND REGULAR (VASCULAR PRODUCTS) ×3 IMPLANT
GLIDESHEATH SLEND SS 6F .021 (SHEATH) ×3 IMPLANT
KIT MANI 3VAL PERCEP (MISCELLANEOUS) ×3 IMPLANT
PACK CARDIAC CATH (CUSTOM PROCEDURE TRAY) ×3 IMPLANT
WIRE HITORQ VERSACORE ST 145CM (WIRE) ×3 IMPLANT
WIRE ROSEN-J .035X260CM (WIRE) ×3 IMPLANT

## 2016-09-16 NOTE — Telephone Encounter (Signed)
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  I have referred him to [facility] for surgical consultation.  Can we try to have him come in on Thursday or Friday for BMP to reevaluate his renal function after cath before restarting metformin, as well as an echo in anticipation of CABG?  Let me know if any issues arise.  Thanks.  [name]

## 2016-09-16 NOTE — Telephone Encounter (Signed)
Received incoming call from scheduling for echo.  Echo scheduled for 09/19/16 at 11:00am and for pt to arrive to check in at the [facility] at 10:45.   Notified patient and wife of appt. Verbalized understanding to come Friday to [facility] at 10:45am for echo and lab work on the same day.

## 2016-09-16 NOTE — H&P (View-Only) (Signed)
 New Outpatient Visit Date: 09/10/2016  Referring Provider: Adriana M Pollak, PA-C [address] [place], [place] [address]  Chief Complaint: Chest pain  HPI:  Stuart Henry is a 75 y.o. year-old male with history of diabetes mellitus and hypertension, who has been referred by Ms. Pollak for evaluation of chest pain. The patient reports exertional chest pain over the last 1.5 weeks. He notes that the discomfort occurs when walking up a steep grade or exercising on the treadmill. He describes the pain as a soreness in the center of the chest with a maximum intensity of 5/10. The pain does not radiate and is associated with shortness of breath. He denies nausea, palpitations, lightheadedness, and diaphoresis. The pain resolves within a minute after stopping to rest. If he begins exerting himself again, the pain will recur. The patient reports having undergone a cardiac catheterization in 1996. At the time he was told that his heart enzymes were slightly elevated. Per his report, no significant CAD was identified.  Stuart Henry believes that his discomfort may be partly due to medications. He was switched from Actos to Jardiance this fall due to suboptimally controlled blood sugars. He initially had a good response but subsequently had persistently elevated blood glucose after Thanksgiving. Actos was restarted a few weeks ago, after which the patient began having the aforementioned exertional chest pain. He stopped taking the medication few days ago and has not had any further episodes of chest discomfort. However, he has also not perform any strenuous activities during this time. He denies chest pain at rest.  --------------------------------------------------------------------------------------------------  Cardiovascular History & Procedures: Cardiovascular Problems:  Angina  Risk Factors:  Diabetes mellitus, hypertension, male gender, and age  Cath/PCI:  LHC (1996): reportedly showed  minimal atherosclerosis.  CV Surgery:  None  EP Procedures and Devices:  None  Non-Invasive Evaluation(s):  None  Recent CV Pertinent Labs: Lab Results  Component Value Date   CHOL 179 03/28/2016   HDL 33 (L) 03/28/2016   LDLCALC 97 03/28/2016   TRIG 246 (H) 03/28/2016   CHOLHDL 5.4 (H) 03/28/2016   K 4.5 05/05/2016   BUN 22 05/05/2016   CREATININE 1.32 (H) 05/05/2016    --------------------------------------------------------------------------------------------------  Past Medical History:  Diagnosis Date  . Diabetes mellitus without complication (HCC)   . Hypertension     Past Surgical History:  Procedure Laterality Date  . CARDIAC CATHETERIZATION  1996   [place], No stent  . COLONOSCOPY WITH PROPOFOL N/A 02/09/2015   Procedure: COLONOSCOPY WITH PROPOFOL;  Surgeon: Robert T Elliott, MD;  Location: [facility] ENDOSCOPY;  Service: Endoscopy;  Laterality: N/A;  . KNEE SURGERY  2010   to correct a severed patella tendon  . TOTAL KNEE ARTHROPLASTY  2010    Outpatient Encounter Prescriptions as of 09/10/2016  Medication Sig  . ACCU-CHEK AVIVA PLUS test strip Check blood sugar once  daily  . allopurinol (ZYLOPRIM) 100 MG tablet TAKE 1 TABLET DAILY  . aspirin 81 MG tablet Take 81 mg by mouth daily.  . benazepril-hydrochlorthiazide (LOTENSIN HCT) 20-12.5 MG tablet TAKE 1 TABLET DAILY  . Blood Glucose Monitoring Suppl (ACCU-CHEK AVIVA PLUS) w/Device KIT Use to check blood sugar daily as directed  . empagliflozin (JARDIANCE) 25 MG TABS tablet Take 25 mg by mouth daily.  . fluticasone (FLONASE) 50 MCG/ACT nasal spray Place 2 sprays into both nostrils daily.  . gabapentin (NEURONTIN) 300 MG capsule TAKE ONE CAPSULE BY MOUTH AT BEDTIME FOR NEUROPATHY  . glipiZIDE (GLUCOTROL XL) 10 MG   24 hr tablet Take 1 tablet (10 mg total) by mouth daily with breakfast.  . ibuprofen (ADVIL,MOTRIN) 200 MG tablet Take 200 mg by mouth every 6 (six) hours as needed.  . metFORMIN (GLUCOPHAGE) 1000  MG tablet TAKE 1 TABLET TWICE A DAY WITH A MEAL  . repaglinide (PRANDIN) 1 MG tablet TAKE ONE TABLET BY MOUTH UP TO 3 TIMES DAILY BEFORE MEALS FOR BLOOD SUGAR  . pioglitazone (ACTOS) 45 MG tablet Take 1 tablet (45 mg total) by mouth daily. (Patient not taking: Reported on 09/10/2016)  . [DISCONTINUED] amLODipine (NORVASC) 5 MG tablet Take 1 tablet (5 mg total) by mouth daily. (Patient not taking: Reported on 09/10/2016)   No facility-administered encounter medications on file as of 09/10/2016.     Allergies: Patient has no known allergies.  Social History   Social History  . Marital status: Married    Spouse name: N/A  . Number of children: N/A  . Years of education: N/A   Occupational History  . Retired    Social History Main Topics  . Smoking status: Former Smoker    Packs/day: 1.00    Years: 5.00    Types: Cigarettes    Quit date: 08/06/1972  . Smokeless tobacco: Never Used  . Alcohol use No  . Drug use: No  . Sexual activity: Not on file   Other Topics Concern  . Not on file   Social History Narrative  . No narrative on file    Family History  Problem Relation Age of Onset  . Dementia Mother   . Diabetes Mother   . Cancer Father   . Diabetes Sister   . Diabetes Brother   . Heart disease Other     Review of Systems: Occasional numbness and pin-like pain in feet/toes.  Often at night or when still.  A 12-system review of systems was performed and was negative except as noted in the HPI.  --------------------------------------------------------------------------------------------------  Physical Exam: BP 98/72 (BP Location: Left Arm, Patient Position: Sitting, Cuff Size: Normal)   Pulse 74   Ht 6' 4" (1.93 m)   Wt 216 lb 8 oz (98.2 kg)   BMI 26.35 kg/m   General:  Well-developed, well-nourished man seated comfortably in the exam room. He is accompanied by his wife. HEENT: No conjunctival pallor or scleral icterus.  Moist mucous membranes.  OP clear. Neck:  Supple without lymphadenopathy, thyromegaly, JVD, or HJR.  No carotid bruit. Lungs: Normal work of breathing.  Clear to auscultation bilaterally without wheezes or crackles. Heart: Regular rate and rhythm without murmurs, rubs, or gallops.  Non-displaced PMI. Abd: Bowel sounds present.  Soft, NT/ND without hepatosplenomegaly Ext: No lower extremity edema.  Radial, PT, and DP pulses are 2+ bilaterally Skin: warm and dry without rash Neuro: CNIII-XII intact.  Strength and fine-touch sensation intact in upper and lower extremities bilaterally. Psych: Normal mood and affect.  EKG:  Normal sinus rhythm with inferior Q waves. No significant change from prior tracings as far back as 02/23/15 (I have personally reviewed today's and multiple prior tracings).  Lab Results  Component Value Date   WBC 5.2 03/28/2016   HGB 14.2 03/04/2013   HCT 40.8 03/28/2016   MCV 89 03/28/2016   PLT 226 03/28/2016    Lab Results  Component Value Date   NA 137 05/05/2016   K 4.5 05/05/2016   CL 100 05/05/2016   CO2 21 05/05/2016   BUN 22 05/05/2016   CREATININE 1.32 (H) 05/05/2016   GLUCOSE   193 (H) 05/05/2016   ALT 14 03/28/2016    Lab Results  Component Value Date   CHOL 179 03/28/2016   HDL 33 (L) 03/28/2016   LDLCALC 97 03/28/2016   TRIG 246 (H) 03/28/2016   CHOLHDL 5.4 (H) 03/28/2016    --------------------------------------------------------------------------------------------------  ASSESSMENT AND PLAN: Angina pectoris Patient's exertional chest pain is concerning for angina. Given that it is new, it qualifies as unstable angina. However, Stuart Henry has not had any discomfort at rest or prolonged episodes that would necessitate hospitalization and urgent catheterization. We have discussed further evaluation options, including noninvasive testing and cardiac catheterization. Given the atypical nature of his chest pain as well as multiple cardiac risk factors (diabetes, hypertension, male gender,  and age), we have agreed to proceed with cardiac catheterization. I have reviewed the risks, indications, and alternatives to cardiac catheterization, possible angioplasty, and stenting with the patient. Risks include but are not limited to bleeding, infection, vascular injury, stroke, myocardial infection, arrhythmia, kidney injury, radiation-related injury in the case of prolonged fluoroscopy use, emergency cardiac surgery, and death. The patient understands the risks of serious complication is 1-2 in 1000 with diagnostic cardiac cath and 1-2% or less with angioplasty/stenting. We will arrange this to be done within the next week. I will not make any medication changes today. We will check routine precatheterization labs including CBC, BMP, and PT/INR today. We will also perform a preop chest radiograph. I provided the patient with a prescription for sublingual nitroglycerin to be taken as needed. If his symptoms worsen before catheterization, he should seek immediate medical attention.  Essential hypertension Blood pressure is low normal today. We will not add any additional agents at this time.  Hyperlipidemia Most recent lipid panel in 03/2016 was notable for elevated triglycerides. LDL was 97. Given his history of diabetes, it would be reasonable to consider a statin. If his catheterization demonstrates CAD, we will certainly need to initiate this.  Follow-up: To be determined based on catheterization.  Munira Polson, MD 09/10/2016 10:48 AM  

## 2016-09-16 NOTE — Interval H&P Note (Signed)
History and Physical Interval Note:  09/16/2016 7:04 AM  Stuart Henry  has presented today for cardiac catheterization, with the diagnosis of chest pain. The various methods of treatment have been discussed with the patient and family. After consideration of risks, benefits and other options for treatment, the patient has consented to  Procedure(s): Left Heart Cath and Coronary Angiography (Left) as a surgical intervention .  The patient's history has been reviewed, patient examined, no change in status, stable for surgery.  I have reviewed the patient's chart and labs.  Questions were answered to the patient's satisfaction.    Cath Lab Visit (complete for each Cath Lab visit)  Clinical Evaluation Leading to the Procedure:   ACS: No.  Non-ACS:    Anginal Classification: CCS II (New over last 2 weeks)  Anti-ischemic medical therapy: No Therapy  Non-Invasive Test Results: No non-invasive testing performed  Prior CABG: No previous CABG   Selena Swaminathan

## 2016-09-16 NOTE — Telephone Encounter (Signed)
Left message with echo scheduling to call back to schedule an echo appt for patient.

## 2016-09-16 NOTE — Discharge Instructions (Signed)
Transradial Angiogram Transradial angiogram is an imaging test. This test uses X-ray images and colored dye that is made up of an iodine solution (contrast dye). This test is done to check for any abnormalities in the vessels that might affect blood flow, such as:  A blocked blood vessel.  A narrowed blood vessel.  A blood clot.  Abnormal, inherited blood vessel connections. During this test, a small, flexible tube (catheter) is inserted into an artery in the wrist (radial artery). The catheter is moved from the radial artery into other blood vessels in the body that need to be examined. Contrast dye is used to make blood vessels visible on X-ray images that are taken during the procedure. Tell a health care provider about:  Any allergies you have.  All medicines you are taking, including vitamins, herbs, eye drops, creams, and over-the-counter medicines.  Any problems you or family members have had with anesthetic medicines.  Any blood disorders you have.  Any surgeries you have had.  Any medical conditions you have.  Whether you are pregnant or may be pregnant. What are the risks? Generally, this is a safe procedure. However, problems may occur, including:  Infection.  Bleeding.  Allergic reactions to medicines or dyes.  Damage to other structures or organs, such as the blood vessels, lungs, or heart.  Blood clots.  Blood flow through the radial artery stopping or slowing down. This is rare. What happens before the procedure?  Ask your health care provider about:  Changing or stopping your regular medicines. This is especially important if you are taking diabetes medicines or blood thinners.  Taking medicines such as aspirin and ibuprofen. These medicines can thin your blood. Do not take these medicines before your procedure if your health care provider instructs you not to.  Follow instructions from your health care provider about eating or drinking  restrictions.  Do not use tobacco products for at least 24 hours before your procedure or as told by your health care provider. This includes cigarettes, chewing tobacco, or e-cigarettes.  Ask your health care provider how your surgical site will be marked or identified.  You may be given antibiotic medicine to help prevent infection.  You may have a physical exam.  You may have tests, including:  Blood tests.  X-rays.  Plan to have someone take you home after the procedure.  If you will be going home right after the procedure, plan to have someone with you for 24 hours. What happens during the procedure?  To reduce your risk of infection:  Your health care team will wash or sanitize their hands.  Your skin will be washed with soap.  An IV tube will be inserted into one of your veins.  You will be given the following:  A medicine to help you relax (sedative).  A medicine that is injected to numb the area near the radial artery in your wrist (local anesthetic).  A needle will be inserted into your radial artery in your wrist.  A catheter will be inserted into your radial artery. The needle helps guide the catheter into your radial artery.  The catheter will be moved through your body to the desired blood vessel. An X-ray machine (fluoroscope) will help your health care provider bring the catheter to the correct place in your body.  Contrast dye will be injected into the catheter and will travel to the blood vessel that is being examined.  X-ray images will be taken of how the dye flows  through your blood vessel. While the images are being taken, you may be given instructions on breathing, swallowing, moving, or talking.  The catheter and needle will be removed from your body.  A pressure (compression) wrap will be applied to your wrist to stop bleeding. The procedure may vary among health care providers and hospitals. What happens after the procedure?  You will need  to keep your wrist still for as long as told by your health care provider.  The pressure applied to your wrist will be gradually decreased until the compression wrap is removed.  You may have soreness and bruising in your wrist. This is normal. This should get better within about 1 week.  Your blood pressure, heart rate, breathing rate, and blood oxygen level will be monitored often until the medicines you were given have worn off.  You may continue to receive fluids and medicines through an IV tube.  Do not drive for 24 hours if you received a sedative.  You may have to wear compression stockings. These stockings help to prevent blood clots and reduce swelling in your legs. This information is not intended to replace advice given to you by your health care provider. Make sure you discuss any questions you have with your health care provider. Document Released: 04/14/2012 Document Revised: 03/23/2016 Document Reviewed: 06/25/2015 Elsevier Interactive Patient Education  2017 [employer].

## 2016-09-17 ENCOUNTER — Other Ambulatory Visit: Payer: Self-pay | Admitting: Family Medicine

## 2016-09-18 ENCOUNTER — Telehealth: Payer: Self-pay | Admitting: Internal Medicine

## 2016-09-18 NOTE — Telephone Encounter (Signed)
I spoke with Mr. Stephan, who reports that he feels almost back to normal. He was very weak and dizzy this morning. He did not have any chest pain. I am concerned this could be related to effects from isosorbide mononitrate. We will therefore discontinue this medication. It is fine for him to begin the samples of ranolazine that are waiting for him at the office. I advised him if that if he does not continue to improve and returned to his baseline by the [place] of today or has any worsening of his symptoms, he should go to the nearest emergency department immediately for evaluation given his multivessel CAD. He presents to the ED, I would advocate for admission at [facility] with expedited surgical consultation.  Nelva Bush, MD [facility] Pager: [phone]

## 2016-09-18 NOTE — Telephone Encounter (Signed)
Discussed patient with Christell Faith, PA. He advised to have patient to increase intake of water and avoid diet Dr. Sonia Side. He prescribed Ranexa 500 mg by mouth twice a day as well. Encouraged patient to change positions slowly from sitting to standing as sometimes Isosorbide can cause these symptoms.  S/w patient. Gave him recommendations and he verbalized understanding. Patient said he did not want to start the new medication and wait and see when his surgery was going to be. Advised patient it would be beneficial to start and offered patient samples for the next 2 weeks. He agreed and will pick up Ranexa samples tomorrow when he is at the hospital getting lab work and echo. Patient verbalized understanding to drink water today and the importance of water intake was explained and repeated several times. Patient stated he was feeling better since we originally talked earlier.  BP at this time 105/70, HR 75. He said he had also had an adequate bowel movement which he was waiting to have, as the other day it was not very much. Patient will call back with any other questions and concerns if needed.

## 2016-09-18 NOTE — Telephone Encounter (Signed)
Medication Samples have been left at front desk for patient to pick up.  Drug name: Ranexa       Strength: 500 mg        Qty: 28 tablets(2 packets)  LOT: [id number]  Exp.Date: 04/2019  Dosing instructions: Ranexa 500 mg (1 tablet) by mouth two times a day.

## 2016-09-18 NOTE — Telephone Encounter (Signed)
Pt states he is feeling very weak, states he had a heart cath on Monday. States he has not felt this before.

## 2016-09-18 NOTE — Telephone Encounter (Signed)
S/w patient. Patient experiencing weakness that started at 10 am this morning. He said he was fine when he first got up. However since 10 am, when he went to walk, he felt dizziness from sitting to standing position. He the dizziness passed after a few moments. Patient had breakfast this morning, last bowel movement was yesterday. He said he went to walk up the stairs and it was harder than usual and had some shortness of breath. He also feels like his stomach is a little queasy. Denies chest pain, palpitations, n/v, sweating, jaw pain or arm pain. Patient was able to hold both arms out in front of him for a few seconds with no weakness noted more on one side. BP at start of call around 1100 am was 111/77, HR 101. At 11:43 am, BP 111/75, HR 81.  When asked if he's been drinking plenty of water/fluids, he stated he had coffee and Diet Dr Malachi Bonds today.  Between yesterday and today he's had 3 liters of Diet Dr Malachi Bonds.  Advised patient to significantly decrease consumption of Diet Dr Malachi Bonds and replace with water.  Patient had left heart cath on 09/16/16. Started on Isosorbide mononitrate and atorvastatin at discharge. His first dose of each medication was 09/17/16 and second dose today 09/18/16. He did not feel this way yesterday. Admits to having a small headache which he does not usually have. Echo scheduled for 09/19/16. Consult with TCTS 09/22/16.   Advised patient to drink water instead of diet Dr Malachi Bonds, change positions slowly from sitting to standing.  Will consult with Christell Faith, PA as he is in the office and has opening this afternoon to see if patient needs to come in.

## 2016-09-19 ENCOUNTER — Ambulatory Visit
Admission: RE | Admit: 2016-09-19 | Discharge: 2016-09-19 | Disposition: A | Discharge status: Home / Self Care | Payer: Medicare Other | Source: Ambulatory Visit | Attending: Internal Medicine | Admitting: Internal Medicine

## 2016-09-19 ENCOUNTER — Other Ambulatory Visit
Admission: RE | Admit: 2016-09-19 | Discharge: 2016-09-19 | Disposition: A | Discharge status: Home / Self Care | Payer: Medicare Other | Source: Ambulatory Visit | Attending: Internal Medicine | Admitting: Internal Medicine

## 2016-09-19 ENCOUNTER — Other Ambulatory Visit: Payer: Self-pay

## 2016-09-19 ENCOUNTER — Telehealth: Payer: Self-pay | Admitting: Internal Medicine

## 2016-09-19 DIAGNOSIS — I10 Essential (primary) hypertension: Secondary | ICD-10-CM

## 2016-09-19 DIAGNOSIS — I2510 Atherosclerotic heart disease of native coronary artery without angina pectoris: Secondary | ICD-10-CM | POA: Diagnosis not present

## 2016-09-19 DIAGNOSIS — I5189 Other ill-defined heart diseases: Secondary | ICD-10-CM | POA: Insufficient documentation

## 2016-09-19 DIAGNOSIS — Z79899 Other long term (current) drug therapy: Secondary | ICD-10-CM

## 2016-09-19 DIAGNOSIS — I517 Cardiomegaly: Secondary | ICD-10-CM | POA: Diagnosis not present

## 2016-09-19 LAB — BASIC METABOLIC PANEL
ANION GAP: 8 (ref 5–15)
BUN: 26 mg/dL — ABNORMAL HIGH (ref 6–20)
CHLORIDE: 99 mmol/L — AB (ref 101–111)
CO2: 27 mmol/L (ref 22–32)
CREATININE: 1.51 mg/dL — AB (ref 0.61–1.24)
Calcium: 9.3 mg/dL (ref 8.9–10.3)
GFR calc non Af Amer: 44 mL/min — ABNORMAL LOW (ref >60)
GFR, EST AFRICAN AMERICAN: 51 mL/min — AB (ref >60)
Glucose, Bld: 206 mg/dL — ABNORMAL HIGH (ref 65–99)
POTASSIUM: 4.3 mmol/L (ref 3.5–5.1)
SODIUM: 134 mmol/L — AB (ref 135–145)

## 2016-09-19 NOTE — Telephone Encounter (Signed)
I spoke with Stuart Henry re: results of echo and BMP. Echo shows mildly reduced LV function. BMP notable for mild increase in creatine from 1.3 to 1.5 post-cath. Mr. Roscoe reports feeling well, back to his baseline. I have asked him to increase his fluid intake and continue to hold metformin. We will have him return for repeat BMP early next week. I advised him to seek immediate medical attention for any chest pain, shortness of breath, or edema. He is scheduled to be seen by [facility] on Monday.  Nelva Bush, MD [facility] Pager: [phone]

## 2016-09-19 NOTE — Telephone Encounter (Signed)
S/w pt who is agreeable to have repeat BMET on Feb 19 @ [facility] outpatient lab. Order placed

## 2016-09-19 NOTE — Progress Notes (Signed)
*  PRELIMINARY RESULTS* Echocardiogram 2D Echocardiogram has been performed.  Sherrie Sport 09/19/2016, 11:13 AM

## 2016-09-22 ENCOUNTER — Other Ambulatory Visit: Payer: Self-pay | Admitting: *Deleted

## 2016-09-22 ENCOUNTER — Institutional Professional Consult (permissible substitution) (INDEPENDENT_AMBULATORY_CARE_PROVIDER_SITE_OTHER): Payer: Medicare Other | Admitting: Thoracic Surgery (Cardiothoracic Vascular Surgery)

## 2016-09-22 ENCOUNTER — Encounter: Payer: Self-pay | Admitting: Thoracic Surgery (Cardiothoracic Vascular Surgery)

## 2016-09-22 ENCOUNTER — Other Ambulatory Visit
Admission: RE | Admit: 2016-09-22 | Discharge: 2016-09-22 | Disposition: A | Discharge status: Home / Self Care | Payer: Medicare Other | Source: Ambulatory Visit | Attending: Internal Medicine | Admitting: Internal Medicine

## 2016-09-22 VITALS — BP 116/73 | HR 83 | Resp 20 | Ht 76.0 in | Wt 216.0 lb

## 2016-09-22 DIAGNOSIS — I2510 Atherosclerotic heart disease of native coronary artery without angina pectoris: Secondary | ICD-10-CM

## 2016-09-22 DIAGNOSIS — I10 Essential (primary) hypertension: Secondary | ICD-10-CM | POA: Diagnosis present

## 2016-09-22 LAB — BASIC METABOLIC PANEL
Anion gap: 8 (ref 5–15)
BUN: 34 mg/dL — AB (ref 6–20)
CHLORIDE: 97 mmol/L — AB (ref 101–111)
CO2: 27 mmol/L (ref 22–32)
Calcium: 9.2 mg/dL (ref 8.9–10.3)
Creatinine, Ser: 1.51 mg/dL — ABNORMAL HIGH (ref 0.61–1.24)
GFR calc Af Amer: 51 mL/min — ABNORMAL LOW (ref >60)
GFR calc non Af Amer: 44 mL/min — ABNORMAL LOW (ref >60)
GLUCOSE: 386 mg/dL — AB (ref 65–99)
POTASSIUM: 4.6 mmol/L (ref 3.5–5.1)
Sodium: 132 mmol/L — ABNORMAL LOW (ref 135–145)

## 2016-09-22 NOTE — Progress Notes (Signed)
PCP is [name], MD Referring Provider is End, Harrell Gave, MD  Chief Complaint  Patient presents with  . Coronary Artery Disease    eval for CABG...CATHED @ [facility] 09/19/16, ECHO 09/16/16    HPI: 76 yo man presents with a cc/o exertional CP.  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 About 2 weeks ago while walking up an incline he noted sudden onset of a soreness in his chest. This was associated with shortness of breath, but no nausea, vomiting, or diaphoresis. It resolved with rest. That happened to him 3 days in a row. He went to eye Dr. Caryn Section and was referred to Dr. Saunders Revel.  Dr. Saunders Revel performed cardiac catheterization on 09/16/2016. It revealed severe two-vessel coronary disease with a 95% ostial LAD lesion involving the takeoff of a high diagonal branch. There also was a 90% stenosis in the ramus intermedius involving both portions of the bifurcation. There was a 50% stenosis in the small AV groove circumflex. There was no significant disease in the right coronary. An echocardiogram revealed no significant valvular pathology. There was an ejection fraction of 45-50%.  He has not had any rest or nocturnal pain. He has had some dizzy spells recently. He says they're in frequent and usually when he stands up quickly. His blood sugars have been more difficult to manage recently. He does have diabetic neuropathy in his feet. He remains physically active although he's had to cut back due to his recent onset of chest pain.   Past Medical History:  Diagnosis Date  . Coronary artery disease   . Diabetes mellitus without complication ([place])   . Hypertension     Past Surgical History:  Procedure Laterality Date  . [place], No stent  . COLONOSCOPY WITH PROPOFOL N/A 02/09/2015   Procedure: COLONOSCOPY  WITH PROPOFOL;  Surgeon: Manya Silvas, MD;  Location: [facility] ENDOSCOPY;  Service: Endoscopy;  Laterality: N/A;  . KNEE SURGERY  2010   to correct a severed patella tendon  . LEFT HEART CATH AND CORONARY ANGIOGRAPHY Left 09/16/2016   Procedure: Left Heart Cath and Coronary Angiography;  Surgeon: Nelva Bush, MD;  Location: [place] CV LAB;  Service: Cardiovascular;  Laterality: Left;  . TOTAL KNEE ARTHROPLASTY  2010    Family History  Problem Relation Age of Onset  . Dementia Mother   . Diabetes Mother   . Cancer Father   . Diabetes Sister   . Diabetes Brother   . Heart disease Other     Social History Social History  Substance Use Topics  . Smoking status: Former Smoker    Packs/day: 1.00    Years: 5.00    Types: Cigarettes    Quit date: 08/06/1972  . Smokeless tobacco: Never Used  . Alcohol use No    Current Outpatient Prescriptions  Medication Sig Dispense Refill  . ACCU-CHEK AVIVA PLUS test strip Check blood sugar once  daily 100 each 4  . allopurinol (ZYLOPRIM) 100 MG tablet TAKE 1 TABLET DAILY 90 tablet 4  . aspirin 81 MG tablet Take 81 mg by mouth at bedtime.     Marland Kitchen atorvastatin (LIPITOR) 40 MG tablet Take 1 tablet (40 mg total) by mouth daily. 30 tablet 5  . benazepril-hydrochlorthiazide (LOTENSIN HCT) 20-12.5 MG tablet TAKE 1 TABLET DAILY 90 tablet 3  .  Blood Glucose Monitoring Suppl (ACCU-CHEK AVIVA PLUS) w/Device KIT Use to check blood sugar daily as directed 1 kit 0  . empagliflozin (JARDIANCE) 25 MG TABS tablet Take 25 mg by mouth daily. 30 tablet 6  . folic acid (FOLVITE) 941 MCG tablet Take 800 mcg by mouth daily.    Marland Kitchen gabapentin (NEURONTIN) 300 MG capsule TAKE ONE CAPSULE BY MOUTH AT BEDTIME FOR NEUROPATHY 90 capsule 4  . glipiZIDE (GLUCOTROL XL) 10 MG 24 hr tablet Take 1 tablet (10 mg total) by mouth daily with breakfast. 90 tablet 3  . nitroGLYCERIN (NITROSTAT) 0.4 MG SL tablet Place 1 tablet (0.4 mg total) under the tongue every 5 (five) minutes as  needed for chest pain. 35 tablet 2  . pioglitazone (ACTOS) 45 MG tablet Take 1 tablet (45 mg total) by mouth daily. 1 tablet 1  . repaglinide (PRANDIN) 1 MG tablet TAKE ONE TABLET BY MOUTH UP TO 3 TIMES DAILY BEFORE MEALS FOR BLOOD SUGAR 30 tablet 12  . vitamin B-12 (CYANOCOBALAMIN) 1000 MCG tablet Take 1,000 mcg by mouth daily.    . fluticasone (FLONASE) 50 MCG/ACT nasal spray Place 2 sprays into both nostrils daily. (Patient taking differently: Place 2 sprays into both nostrils daily as needed for allergies. ) 16 g 6   No current facility-administered medications for this visit.     No Known Allergies  Review of Systems  Constitutional: Positive for fatigue and unexpected weight change (Has lost 6 pounds in 3 months). Negative for appetite change.  HENT: Positive for hearing loss. Negative for trouble swallowing and voice change.   Eyes: Positive for visual disturbance (Cataract surgery).  Cardiovascular: Positive for chest pain. Negative for leg swelling.  Gastrointestinal: Negative for abdominal pain and blood in stool.       Hiatal hernia  Genitourinary: Positive for frequency. Negative for dysuria.  Musculoskeletal: Negative for back pain and joint swelling.  Neurological: Positive for dizziness and numbness (and paresthesias both feet).  Hematological: Negative for adenopathy. Does not bruise/bleed easily.  All other systems reviewed and are negative.   BP 116/73   Pulse 83   Resp 20   Ht _0  (1.93 m)   Wt 216 lb (98 kg)   SpO2 98%   BMI 26.29 kg/m  Physical Exam  Constitutional: He is oriented to person, place, and time. He appears well-developed and well-nourished. No distress.  HENT:  Head: Normocephalic and atraumatic.  Eyes: Conjunctivae are normal. No scleral icterus.  Neck: Neck supple. No thyromegaly present.  No carotid bruits  Cardiovascular: Normal rate, regular rhythm, normal heart sounds and intact distal pulses.  Exam reveals no gallop and no friction  rub.   No murmur heard. Pulmonary/Chest: Effort normal and breath sounds normal. No respiratory distress. He has no wheezes. He has no rales.  Abdominal: Soft. He exhibits no distension. There is no tenderness.  Musculoskeletal: He exhibits no edema.  Lymphadenopathy:    He has no cervical adenopathy.  Neurological: He is alert and oriented to person, place, and time. No cranial nerve deficit.  Motor 5/5 bilaterally  Skin: Skin is warm and dry.  Vitals reviewed.    Diagnostic Tests: CARDIAC CATHETERIZATION Conclusion   Conclusions: 1. Significant two-vessel coronary artery disease, including 95% proximal LAD stenosis involving ostium of D1 (90%), 90% medium-caliber ramus intermedius, stenosis, and sequential ostial and mid LCx lesions. 2. Mild CAD involving distal RCA. 3. Normal left ventricular filling pressure and left ventricular contraction (LVEF 55-65%).  Recommendations: 1. Given two  vessel CAD (including proximal LAD) and history of diabetes mellitus, recommend surgical consultation for CABG. 2. Initiate atorvastatin 40 mg daily and isosorbide mononitrate 30 mg daily. 3. Recheck BMP in 2-3 days; if renal function is stable, restart metformin. 4. Obtain transthoracic echocardiogram later this week.  Nelva Bush, MD [place] Pager: [phone]   ECHOCARDIOGRAM Study Conclusions  - Left ventricle: The cavity size was normal. Systolic function was   mildly reduced. The estimated ejection fraction was in the range   of 45% to 50%. Hypokinesis of the basal to mid anterior and   anteroseptal myocardium. Doppler parameters are consistent with   abnormal left ventricular relaxation (grade 1 diastolic   dysfunction). - Left atrium: The atrium was mildly dilated. - Right ventricle: Systolic function was normal. - Pulmonary arteries: Systolic pressure was within the normal   range. 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 He has severe 2 vessel CAD and mild to moderate LV dysfunction with an EF of 45%. CABG is indicated for survival benefit and relief of symptoms.  I reviewed the cardiac catheterization images with Stuart. and Mrs. Henry.  I discussed the general nature of the procedure, the need for general anesthesia, the use of cardiopulmonary bypass, and the incisions to be used with Stuart Henry. We discussed the expected hospital stay, overall recovery and short and long term outcomes. I informed them of the indications, risks, benefits and alternatives. They understand the risks include, but are not limited to death, stroke, MI, DVT/PE, bleeding, possible need for transfusion, infections,other organ system dysfunction including respiratory, renal, or GI complications.   He accepts the risks and agrees to proceed.  Diabetes- he is concerned that statin is making his blood sugars heart control. We will observe that closely while he is in the hospital.  Plan: CABG on Wednesday 09/24/2016  Melrose Nakayama, MD [facility] [phone]

## 2016-09-23 ENCOUNTER — Encounter (HOSPITAL_COMMUNITY)
Admission: RE | Admit: 2016-09-23 | Discharge: 2016-09-23 | Disposition: A | Discharge status: Home / Self Care | Payer: Medicare Other | Source: Ambulatory Visit | Attending: Thoracic Surgery (Cardiothoracic Vascular Surgery) | Admitting: Thoracic Surgery (Cardiothoracic Vascular Surgery)

## 2016-09-23 ENCOUNTER — Telehealth: Payer: Self-pay | Admitting: Internal Medicine

## 2016-09-23 ENCOUNTER — Ambulatory Visit (HOSPITAL_BASED_OUTPATIENT_CLINIC_OR_DEPARTMENT_OTHER)
Admission: RE | Admit: 2016-09-23 | Discharge: 2016-09-23 | Disposition: A | Discharge status: Home / Self Care | Payer: Medicare Other | Source: Ambulatory Visit | Attending: Thoracic Surgery (Cardiothoracic Vascular Surgery) | Admitting: Thoracic Surgery (Cardiothoracic Vascular Surgery)

## 2016-09-23 ENCOUNTER — Encounter (HOSPITAL_COMMUNITY): Payer: Self-pay

## 2016-09-23 DIAGNOSIS — I2510 Atherosclerotic heart disease of native coronary artery without angina pectoris: Secondary | ICD-10-CM

## 2016-09-23 DIAGNOSIS — I6523 Occlusion and stenosis of bilateral carotid arteries: Secondary | ICD-10-CM

## 2016-09-23 HISTORY — DX: Personal history of other diseases of the digestive system: Z87.19

## 2016-09-23 LAB — CBC
HEMATOCRIT: 43.3 % (ref 39.0–52.0)
Hemoglobin: 15.1 g/dL (ref 13.0–17.0)
MCH: 29.9 pg (ref 26.0–34.0)
MCHC: 34.9 g/dL (ref 30.0–36.0)
MCV: 85.7 fL (ref 78.0–100.0)
Platelets: 232 10*3/uL (ref 150–400)
RBC: 5.05 MIL/uL (ref 4.22–5.81)
RDW: 13.6 % (ref 11.5–15.5)
WBC: 6.9 10*3/uL (ref 4.0–10.5)

## 2016-09-23 LAB — COMPREHENSIVE METABOLIC PANEL
ALT: 88 U/L — ABNORMAL HIGH (ref 17–63)
ANION GAP: 11 (ref 5–15)
AST: 37 U/L (ref 15–41)
Albumin: 4.7 g/dL (ref 3.5–5.0)
Alkaline Phosphatase: 44 U/L (ref 38–126)
BILIRUBIN TOTAL: 0.9 mg/dL (ref 0.3–1.2)
BUN: 28 mg/dL — AB (ref 6–20)
CO2: 24 mmol/L (ref 22–32)
Calcium: 10.3 mg/dL (ref 8.9–10.3)
Chloride: 98 mmol/L — ABNORMAL LOW (ref 101–111)
Creatinine, Ser: 1.31 mg/dL — ABNORMAL HIGH (ref 0.61–1.24)
GFR, EST NON AFRICAN AMERICAN: 52 mL/min — AB (ref >60)
Glucose, Bld: 236 mg/dL — ABNORMAL HIGH (ref 65–99)
POTASSIUM: 4.3 mmol/L (ref 3.5–5.1)
Sodium: 133 mmol/L — ABNORMAL LOW (ref 135–145)
TOTAL PROTEIN: 7.2 g/dL (ref 6.5–8.1)

## 2016-09-23 LAB — URINALYSIS, ROUTINE W REFLEX MICROSCOPIC
BACTERIA UA: NONE SEEN
BILIRUBIN URINE: NEGATIVE
Glucose, UA: >=500 mg/dL — AB
Hgb urine dipstick: NEGATIVE
Ketones, ur: NEGATIVE mg/dL
LEUKOCYTES UA: NEGATIVE
Nitrite: NEGATIVE
Protein, ur: NEGATIVE mg/dL
SPECIFIC GRAVITY, URINE: 1.025 (ref 1.005–1.030)
SQUAMOUS EPITHELIAL / LPF: NONE SEEN
WBC, UA: NONE SEEN WBC/hpf (ref 0–5)
pH: 5 (ref 5.0–8.0)

## 2016-09-23 LAB — TYPE AND SCREEN
ABO/RH(D): O POS
ANTIBODY SCREEN: NEGATIVE

## 2016-09-23 LAB — VAS US DOPPLER PRE CABG
LCCADDIAS: -21 cm/s
LCCADSYS: -76 cm/s
LCCAPSYS: 116 cm/s
LEFT ECA DIAS: -9 cm/s
LEFT VERTEBRAL DIAS: -10 cm/s
LICADSYS: -74 cm/s
Left CCA prox dias: 21 cm/s
Left ICA dist dias: -24 cm/s
Left ICA prox dias: -26 cm/s
Left ICA prox sys: -73 cm/s
RCCADSYS: -81 cm/s
RCCAPSYS: -102 cm/s
RIGHT ECA DIAS: -9 cm/s
RIGHT VERTEBRAL DIAS: -11 cm/s
Right CCA prox dias: -16 cm/s

## 2016-09-23 LAB — SURGICAL PCR SCREEN
MRSA, PCR: NEGATIVE
Staphylococcus aureus: NEGATIVE

## 2016-09-23 LAB — GLUCOSE, CAPILLARY: GLUCOSE-CAPILLARY: 297 mg/dL — AB (ref 65–99)

## 2016-09-23 LAB — PROTIME-INR
INR: 1.08
PROTHROMBIN TIME: 14 s (ref 11.4–15.2)

## 2016-09-23 LAB — APTT: aPTT: 31 seconds (ref 24–36)

## 2016-09-23 LAB — ABO/RH: ABO/RH(D): O POS

## 2016-09-23 MED ORDER — POTASSIUM CHLORIDE 2 MEQ/ML IV SOLN
80.0000 meq | INTRAVENOUS | Status: DC
  Filled 2016-09-23: qty 40

## 2016-09-23 MED ORDER — MAGNESIUM SULFATE 50 % IJ SOLN
40.0000 meq | INTRAMUSCULAR | Status: DC
  Filled 2016-09-23: qty 10

## 2016-09-23 MED ORDER — SODIUM CHLORIDE 0.9 % IV SOLN
INTRAVENOUS | Status: DC
  Filled 2016-09-23: qty 30

## 2016-09-23 MED ORDER — EPINEPHRINE PF 1 MG/ML IJ SOLN
0.0000 ug/min | INTRAVENOUS | Status: DC
  Filled 2016-09-23: qty 4

## 2016-09-23 MED ORDER — DEXTROSE 5 % IV SOLN
750.0000 mg | INTRAVENOUS | Status: DC
  Filled 2016-09-23: qty 750

## 2016-09-23 MED ORDER — DOPAMINE-DEXTROSE 3.2-5 MG/ML-% IV SOLN
0.0000 ug/kg/min | INTRAVENOUS | Status: DC
  Filled 2016-09-23: qty 250

## 2016-09-23 MED ORDER — CHLORHEXIDINE GLUCONATE 0.12 % MT SOLN
15.0000 mL | Freq: Once | OROMUCOSAL | Status: AC
  Administered 2016-09-24: 15 mL via OROMUCOSAL
  Filled 2016-09-23: qty 15

## 2016-09-23 MED ORDER — TRANEXAMIC ACID (OHS) BOLUS VIA INFUSION
15.0000 mg/kg | INTRAVENOUS | Status: AC
  Administered 2016-09-24: 1447.5 mg via INTRAVENOUS
  Filled 2016-09-23: qty 1448

## 2016-09-23 MED ORDER — METOPROLOL TARTRATE 12.5 MG HALF TABLET
12.5000 mg | ORAL_TABLET | Freq: Once | ORAL | Status: DC
  Filled 2016-09-23: qty 1

## 2016-09-23 MED ORDER — SODIUM CHLORIDE 0.9 % IV SOLN
30.0000 ug/min | INTRAVENOUS | Status: AC
  Administered 2016-09-24: 20 ug/min via INTRAVENOUS
  Filled 2016-09-23: qty 2

## 2016-09-23 MED ORDER — SODIUM CHLORIDE 0.9 % IV SOLN
INTRAVENOUS | Status: AC
  Administered 2016-09-24: 3.3 [IU]/h via INTRAVENOUS
  Filled 2016-09-23: qty 2.5

## 2016-09-23 MED ORDER — VANCOMYCIN HCL 10 G IV SOLR
1500.0000 mg | INTRAVENOUS | Status: AC
  Administered 2016-09-24: 1500 mg via INTRAVENOUS
  Filled 2016-09-23: qty 1500

## 2016-09-23 MED ORDER — SODIUM CHLORIDE 0.9 % IV SOLN
1.5000 mg/kg/h | INTRAVENOUS | Status: AC
  Administered 2016-09-24: 1.5 mg/kg/h via INTRAVENOUS
  Filled 2016-09-23: qty 25

## 2016-09-23 MED ORDER — DEXTROSE 5 % IV SOLN
1.5000 g | INTRAVENOUS | Status: AC
  Administered 2016-09-24: .75 g via INTRAVENOUS
  Administered 2016-09-24: 1.5 g via INTRAVENOUS
  Filled 2016-09-23: qty 1.5

## 2016-09-23 MED ORDER — TRANEXAMIC ACID (OHS) PUMP PRIME SOLUTION
2.0000 mg/kg | INTRAVENOUS | Status: DC
  Filled 2016-09-23: qty 1.93

## 2016-09-23 MED ORDER — PLASMA-LYTE 148 IV SOLN
INTRAVENOUS | Status: AC
  Administered 2016-09-24: 500 mL
  Filled 2016-09-23: qty 2.5

## 2016-09-23 MED ORDER — DEXMEDETOMIDINE HCL IN NACL 400 MCG/100ML IV SOLN
0.1000 ug/kg/h | INTRAVENOUS | Status: AC
  Administered 2016-09-24: .3 ug/kg/h via INTRAVENOUS
  Filled 2016-09-23: qty 100

## 2016-09-23 MED ORDER — NITROGLYCERIN IN D5W 200-5 MCG/ML-% IV SOLN
2.0000 ug/min | INTRAVENOUS | Status: AC
  Administered 2016-09-24: 5 ug/min via INTRAVENOUS
  Filled 2016-09-23: qty 250

## 2016-09-23 NOTE — Progress Notes (Signed)
Pt is type 2 diabetic. Last A1C was 9.1 in January. Today's CBG was 297. Pt states he had eaten this am and also had 3 small chocolate bars while in the admitting office. Pt states his fasting blood sugar usually runs between 175-269. I notified Ryan at [facility] of high blood sugar, she states she will let Dr. Roxan Hockey know. Pt states he feels that the "statin" drug he is now on is causing his blood sugar to go up. States when he was on it in the past it caused the same thing. Encouraged pt to eat healthy the rest of the day and to check his blood sugar at least 3 more times today. Pt understands not to take his diabetic medications in the AM.  Also called Ryan to verify that pt did not have to go to the Pulmonary Lab today before he leaves the hospital. She states that since the pt was a late add on, there were no appointments available and Dr. Roxan Hockey is aware and is ok with pt not having that test done today.

## 2016-09-23 NOTE — Pre-Procedure Instructions (Signed)
Stuart Henry  09/23/2016    Your procedure is scheduled on Wednesday, September 24, 2016 at 8:30 AM.   Report to [facility] Entrance "A" Admitting Office at 6:30 AM.   Call this number if you have problems the morning of surgery: [phone]   Remember:  Do not eat food or drink liquids after midnight tonight.  Take these medicines the morning of surgery with A SIP OF WATER: Allopurinol (Zyloprim), NTG - if needed   How to Manage Your Diabetes Before Surgery   Why is it important to control my blood sugar before and after surgery?   Improving blood sugar levels before and after surgery helps healing and can limit problems.  A way of improving blood sugar control is eating a healthy diet by:  - Eating less sugar and carbohydrates  - Increasing activity/exercise  - Talk with your doctor about reaching your blood sugar goals  High blood sugars (greater than 180 mg/dL) can raise your risk of infections and slow down your recovery so you will need to focus on controlling your diabetes during the weeks before surgery.  Make sure that the doctor who takes care of your diabetes knows about your planned surgery including the date and location.  How do I manage my blood sugars before surgery?   Check your blood sugar at least 4 times a day, today before surgery to make sure that they are not too high or low.  Check your blood sugar the morning of your surgery when you wake up and every 2 hours until you get to the Short-Stay unit.  Treat a low blood sugar (less than 70 mg/dL) with 1/2 cup of clear juice (cranberry or apple), 4 glucose tablets, OR glucose gel.  Recheck blood sugar in 15 minutes after treatment (to make sure it is greater than 70 mg/dL).  If blood sugar is not greater than 70 mg/dL on re-check, call [phone] for further instructions.   Report your blood sugar to the Short-Stay nurse when you get to Short-Stay.  References:  [facility], 2007 "How to Manage your Diabetes Before and After Surgery".  What do I do about my diabetes medications?   Do not take oral diabetes medicines (pills) the morning of surgery.   Do not wear jewelry.  Do not wear lotions, powders, cologne or deodorant.  Men may shave face and neck.  Do not bring valuables to the hospital.  [facility] is not responsible for any belongings or valuables.  Contacts, dentures or bridgework may not be worn into surgery.  Leave your suitcase in the car.  After surgery it may be brought to your room.  For patients admitted to the hospital, discharge time will be determined by your treatment team.  Special instructions:  [place] - Preparing for Surgery  Before surgery, you can play an important role.  Because skin is not sterile, your skin needs to be as free of germs as possible.  You can reduce the number of germs on you skin by washing with CHG (chlorahexidine gluconate) soap before surgery.  CHG is an antiseptic cleaner which kills germs and bonds with the skin to continue killing germs even after washing.  Please DO NOT use if you have an allergy to CHG or antibacterial soaps.  If your skin becomes reddened/irritated stop using the CHG and inform your nurse when you arrive at Short Stay.  Do not shave (including legs and underarms) for at  least 48 hours prior to the first CHG shower.  You may shave your face.  Please follow these instructions carefully:   1.  Shower with CHG Soap the night before surgery and the                    morning of Surgery.  2.  If you choose to wash your hair, wash your hair first as usual with your       normal shampoo.  3.  After you shampoo, rinse your hair and body thoroughly to remove the shampoo.  4.  Use CHG as you would any other liquid soap.  You can apply chg directly       to the skin and wash gently with scrungie or a clean washcloth.  5.  Apply the CHG Soap to your body ONLY FROM THE NECK DOWN.         Do not use on open wounds or open sores.  Avoid contact with your eyes, ears, mouth and genitals (private parts).  Wash genitals (private parts) with your normal soap.  6.  Wash thoroughly, paying special attention to the area where your surgery        will be performed.  7.  Thoroughly rinse your body with warm water from the neck down.  8.  DO NOT shower/wash with your normal soap after using and rinsing off       the CHG Soap.  9.  Pat yourself dry with a clean towel.            10.  Wear clean pajamas.            11.  Place clean sheets on your bed the night of your first shower and do not        sleep with pets.  Day of Surgery  Do not apply any lotions/deodorants the morning of surgery.  Please wear clean clothes to the hospital.   Please read over the fact sheets that you were given.

## 2016-09-23 NOTE — Progress Notes (Signed)
Pre-op Cardiac Surgery  Carotid Findings:   Findings are consistent with a 1-39 percent stenosis involving the right internal carotid artery and the left internal carotid artery. The vertebral arteries demonstrate antegrade flow.  Upper Extremity Right Left  Brachial Pressures 126  Triphasic 128  Triphasic  Radial Waveforms Triphasic Triphasic  Ulnar Waveforms Triphasic Triphasic  Palmar Arch (Allen's Test) Palmar waveforms are obliterated with radial compression and decrease greater than fifty percent with ulnar compression. Palmar waveforms remain within normal limits with radial and ulnar compression.    Lower  Extremity Right Left  Dorsalis Pedis >254 191  Posterior Tibial >254 169  Ankle/Brachial Indices 1.98 1.49   Findings:   Right ABI of 1.98 and left ABI of 1.49 are suggestive of falsely elevated pressures likely due to medial calcification.

## 2016-09-23 NOTE — Progress Notes (Signed)
   09/23/16 1130  OBSTRUCTIVE SLEEP APNEA  Have you ever been diagnosed with sleep apnea through a sleep study? No  Do you snore loudly (loud enough to be heard through closed doors)?  0  Do you often feel tired, fatigued, or sleepy during the daytime (such as falling asleep during driving or talking to someone)? 1  Has anyone observed you stop breathing during your sleep? 0  Do you have, or are you being treated for high blood pressure? 1  BMI more than 35 kg/m2? 0  Age > 50 (1-yes) 1  Neck circumference greater than:Male 16 inches or larger, Male 17inches or larger? 1  Male Gender (Yes=1) 1  Obstructive Sleep Apnea Score 5

## 2016-09-23 NOTE — Telephone Encounter (Signed)
I spoke with patient regarding the results of his BMP. Yesterday's value was notable for a stable creatinine of 1.51. It has actually improved today to 1.31. It would be reasonable to restart metformin. However, given that he is scheduled for CABG tomorrow, I will defer restarting this medication to Dr. Roxan Hockey.

## 2016-09-24 ENCOUNTER — Encounter (HOSPITAL_COMMUNITY)
Admission: RE | Disposition: A | Discharge status: Home / Self Care | Payer: Self-pay | Source: Ambulatory Visit | Attending: Thoracic Surgery (Cardiothoracic Vascular Surgery)

## 2016-09-24 ENCOUNTER — Inpatient Hospital Stay (HOSPITAL_COMMUNITY): Payer: Medicare Other

## 2016-09-24 ENCOUNTER — Inpatient Hospital Stay (HOSPITAL_COMMUNITY): Payer: Medicare Other | Admitting: Anesthesiology

## 2016-09-24 ENCOUNTER — Encounter: Payer: Self-pay | Admitting: Family Medicine

## 2016-09-24 ENCOUNTER — Inpatient Hospital Stay (HOSPITAL_COMMUNITY)
Admission: RE | Admit: 2016-09-24 | Discharge: 2016-09-29 | DRG: 236 | Disposition: A | Discharge status: Home / Self Care | Payer: Medicare Other | Source: Ambulatory Visit | Attending: Thoracic Surgery (Cardiothoracic Vascular Surgery) | Admitting: Thoracic Surgery (Cardiothoracic Vascular Surgery)

## 2016-09-24 DIAGNOSIS — Z9842 Cataract extraction status, left eye: Secondary | ICD-10-CM | POA: Diagnosis not present

## 2016-09-24 DIAGNOSIS — I480 Paroxysmal atrial fibrillation: Secondary | ICD-10-CM | POA: Diagnosis not present

## 2016-09-24 DIAGNOSIS — I25110 Atherosclerotic heart disease of native coronary artery with unstable angina pectoris: Principal | ICD-10-CM | POA: Diagnosis present

## 2016-09-24 DIAGNOSIS — Z833 Family history of diabetes mellitus: Secondary | ICD-10-CM

## 2016-09-24 DIAGNOSIS — M109 Gout, unspecified: Secondary | ICD-10-CM | POA: Diagnosis present

## 2016-09-24 DIAGNOSIS — D696 Thrombocytopenia, unspecified: Secondary | ICD-10-CM | POA: Diagnosis present

## 2016-09-24 DIAGNOSIS — E1140 Type 2 diabetes mellitus with diabetic neuropathy, unspecified: Secondary | ICD-10-CM | POA: Diagnosis present

## 2016-09-24 DIAGNOSIS — Z961 Presence of intraocular lens: Secondary | ICD-10-CM | POA: Diagnosis present

## 2016-09-24 DIAGNOSIS — E785 Hyperlipidemia, unspecified: Secondary | ICD-10-CM | POA: Diagnosis present

## 2016-09-24 DIAGNOSIS — Z7982 Long term (current) use of aspirin: Secondary | ICD-10-CM

## 2016-09-24 DIAGNOSIS — Z96659 Presence of unspecified artificial knee joint: Secondary | ICD-10-CM | POA: Diagnosis present

## 2016-09-24 DIAGNOSIS — I2510 Atherosclerotic heart disease of native coronary artery without angina pectoris: Secondary | ICD-10-CM | POA: Diagnosis not present

## 2016-09-24 DIAGNOSIS — Z87891 Personal history of nicotine dependence: Secondary | ICD-10-CM | POA: Diagnosis not present

## 2016-09-24 DIAGNOSIS — Z79899 Other long term (current) drug therapy: Secondary | ICD-10-CM | POA: Diagnosis not present

## 2016-09-24 DIAGNOSIS — Z7984 Long term (current) use of oral hypoglycemic drugs: Secondary | ICD-10-CM | POA: Diagnosis not present

## 2016-09-24 DIAGNOSIS — J9811 Atelectasis: Secondary | ICD-10-CM | POA: Diagnosis not present

## 2016-09-24 DIAGNOSIS — Z7901 Long term (current) use of anticoagulants: Secondary | ICD-10-CM

## 2016-09-24 DIAGNOSIS — Z8249 Family history of ischemic heart disease and other diseases of the circulatory system: Secondary | ICD-10-CM

## 2016-09-24 DIAGNOSIS — I10 Essential (primary) hypertension: Secondary | ICD-10-CM | POA: Diagnosis present

## 2016-09-24 DIAGNOSIS — Z981 Arthrodesis status: Secondary | ICD-10-CM

## 2016-09-24 DIAGNOSIS — D62 Acute posthemorrhagic anemia: Secondary | ICD-10-CM | POA: Diagnosis not present

## 2016-09-24 DIAGNOSIS — R609 Edema, unspecified: Secondary | ICD-10-CM | POA: Diagnosis not present

## 2016-09-24 DIAGNOSIS — Z951 Presence of aortocoronary bypass graft: Secondary | ICD-10-CM

## 2016-09-24 DIAGNOSIS — Z9841 Cataract extraction status, right eye: Secondary | ICD-10-CM

## 2016-09-24 DIAGNOSIS — J90 Pleural effusion, not elsewhere classified: Secondary | ICD-10-CM

## 2016-09-24 HISTORY — PX: TEE WITHOUT CARDIOVERSION: SHX5443

## 2016-09-24 HISTORY — DX: Personal history of other diseases of the musculoskeletal system and connective tissue: Z87.39

## 2016-09-24 HISTORY — PX: CORONARY ARTERY BYPASS GRAFT: SHX141

## 2016-09-24 LAB — POCT I-STAT, CHEM 8
BUN: 33 mg/dL — AB (ref 6–20)
BUN: 31 mg/dL — ABNORMAL HIGH (ref 6–20)
BUN: 26 mg/dL — AB (ref 6–20)
BUN: 31 mg/dL — AB (ref 6–20)
BUN: 28 mg/dL — AB (ref 6–20)
BUN: 29 mg/dL — AB (ref 6–20)
BUN: 23 mg/dL — ABNORMAL HIGH (ref 6–20)
CALCIUM ION: 1.3 mmol/L (ref 1.15–1.40)
CALCIUM ION: 1.12 mmol/L — AB (ref 1.15–1.40)
CHLORIDE: 102 mmol/L (ref 101–111)
CHLORIDE: 96 mmol/L — AB (ref 101–111)
CHLORIDE: 100 mmol/L — AB (ref 101–111)
CREATININE: 1.2 mg/dL (ref 0.61–1.24)
CREATININE: 1 mg/dL (ref 0.61–1.24)
CREATININE: 1.1 mg/dL (ref 0.61–1.24)
CREATININE: 1.1 mg/dL (ref 0.61–1.24)
Calcium, Ion: 1.25 mmol/L (ref 1.15–1.40)
Calcium, Ion: 1.3 mmol/L (ref 1.15–1.40)
Calcium, Ion: 1.09 mmol/L — ABNORMAL LOW (ref 1.15–1.40)
Calcium, Ion: 1.13 mmol/L — ABNORMAL LOW (ref 1.15–1.40)
Calcium, Ion: 1.21 mmol/L (ref 1.15–1.40)
Chloride: 100 mmol/L — ABNORMAL LOW (ref 101–111)
Chloride: 101 mmol/L (ref 101–111)
Chloride: 99 mmol/L — ABNORMAL LOW (ref 101–111)
Chloride: 103 mmol/L (ref 101–111)
Creatinine, Ser: 1.1 mg/dL (ref 0.61–1.24)
Creatinine, Ser: 1 mg/dL (ref 0.61–1.24)
Creatinine, Ser: 1.1 mg/dL (ref 0.61–1.24)
GLUCOSE: 198 mg/dL — AB (ref 65–99)
GLUCOSE: 163 mg/dL — AB (ref 65–99)
GLUCOSE: 120 mg/dL — AB (ref 65–99)
Glucose, Bld: 226 mg/dL — ABNORMAL HIGH (ref 65–99)
Glucose, Bld: 161 mg/dL — ABNORMAL HIGH (ref 65–99)
Glucose, Bld: 191 mg/dL — ABNORMAL HIGH (ref 65–99)
Glucose, Bld: 156 mg/dL — ABNORMAL HIGH (ref 65–99)
HCT: 30 % — ABNORMAL LOW (ref 39.0–52.0)
HEMATOCRIT: 39 % (ref 39.0–52.0)
HEMATOCRIT: 36 % — AB (ref 39.0–52.0)
HEMATOCRIT: 28 % — AB (ref 39.0–52.0)
HEMATOCRIT: 35 % — AB (ref 39.0–52.0)
HEMATOCRIT: 30 % — AB (ref 39.0–52.0)
HEMATOCRIT: 29 % — AB (ref 39.0–52.0)
HEMOGLOBIN: 13.3 g/dL (ref 13.0–17.0)
HEMOGLOBIN: 12.2 g/dL — AB (ref 13.0–17.0)
HEMOGLOBIN: 10.2 g/dL — AB (ref 13.0–17.0)
HEMOGLOBIN: 9.9 g/dL — AB (ref 13.0–17.0)
Hemoglobin: 11.9 g/dL — ABNORMAL LOW (ref 13.0–17.0)
Hemoglobin: 9.5 g/dL — ABNORMAL LOW (ref 13.0–17.0)
Hemoglobin: 10.2 g/dL — ABNORMAL LOW (ref 13.0–17.0)
POTASSIUM: 4.6 mmol/L (ref 3.5–5.1)
POTASSIUM: 4.4 mmol/L (ref 3.5–5.1)
POTASSIUM: 4.1 mmol/L (ref 3.5–5.1)
POTASSIUM: 4.5 mmol/L (ref 3.5–5.1)
POTASSIUM: 4.4 mmol/L (ref 3.5–5.1)
Potassium: 5.3 mmol/L — ABNORMAL HIGH (ref 3.5–5.1)
Potassium: 5.4 mmol/L — ABNORMAL HIGH (ref 3.5–5.1)
SODIUM: 138 mmol/L (ref 135–145)
SODIUM: 135 mmol/L (ref 135–145)
SODIUM: 138 mmol/L (ref 135–145)
SODIUM: 135 mmol/L (ref 135–145)
SODIUM: 139 mmol/L (ref 135–145)
Sodium: 138 mmol/L (ref 135–145)
Sodium: 135 mmol/L (ref 135–145)
TCO2: 28 mmol/L (ref 0–100)
TCO2: 29 mmol/L (ref 0–100)
TCO2: 25 mmol/L (ref 0–100)
TCO2: 27 mmol/L (ref 0–100)
TCO2: 26 mmol/L (ref 0–100)
TCO2: 25 mmol/L (ref 0–100)
TCO2: 22 mmol/L (ref 0–100)

## 2016-09-24 LAB — POCT I-STAT 3, ART BLOOD GAS (G3+)
ACID-BASE DEFICIT: 1 mmol/L (ref 0.0–2.0)
ACID-BASE DEFICIT: 2 mmol/L (ref 0.0–2.0)
ACID-BASE DEFICIT: 4 mmol/L — AB (ref 0.0–2.0)
Acid-base deficit: 3 mmol/L — ABNORMAL HIGH (ref 0.0–2.0)
BICARBONATE: 24.7 mmol/L (ref 20.0–28.0)
BICARBONATE: 22.5 mmol/L (ref 20.0–28.0)
Bicarbonate: 22 mmol/L (ref 20.0–28.0)
Bicarbonate: 22 mmol/L (ref 20.0–28.0)
O2 SAT: 100 %
O2 SAT: 98 %
O2 Saturation: 98 %
O2 Saturation: 96 %
PCO2 ART: 45.8 mmHg (ref 32.0–48.0)
PH ART: 7.32 — AB (ref 7.350–7.450)
PH ART: 7.345 — AB (ref 7.350–7.450)
PO2 ART: 208 mmHg — AB (ref 83.0–108.0)
Patient temperature: 35.8
TCO2: 26 mmol/L (ref 0–100)
TCO2: 24 mmol/L (ref 0–100)
TCO2: 23 mmol/L (ref 0–100)
TCO2: 23 mmol/L (ref 0–100)
pCO2 arterial: 33.6 mmHg (ref 32.0–48.0)
pCO2 arterial: 43.1 mmHg (ref 32.0–48.0)
pCO2 arterial: 40.8 mmHg (ref 32.0–48.0)
pH, Arterial: 7.34 — ABNORMAL LOW (ref 7.350–7.450)
pH, Arterial: 7.429 (ref 7.350–7.450)
pO2, Arterial: 102 mmHg (ref 83.0–108.0)
pO2, Arterial: 116 mmHg — ABNORMAL HIGH (ref 83.0–108.0)
pO2, Arterial: 91 mmHg (ref 83.0–108.0)

## 2016-09-24 LAB — POCT I-STAT 4, (NA,K, GLUC, HGB,HCT)
Glucose, Bld: 132 mg/dL — ABNORMAL HIGH (ref 65–99)
HEMATOCRIT: 31 % — AB (ref 39.0–52.0)
Hemoglobin: 10.5 g/dL — ABNORMAL LOW (ref 13.0–17.0)
Potassium: 4.4 mmol/L (ref 3.5–5.1)
SODIUM: 137 mmol/L (ref 135–145)

## 2016-09-24 LAB — CBC
HCT: 33.9 % — ABNORMAL LOW (ref 39.0–52.0)
HEMATOCRIT: 32.3 % — AB (ref 39.0–52.0)
HEMOGLOBIN: 11.7 g/dL — AB (ref 13.0–17.0)
HEMOGLOBIN: 11 g/dL — AB (ref 13.0–17.0)
MCH: 29.4 pg (ref 26.0–34.0)
MCH: 29.3 pg (ref 26.0–34.0)
MCHC: 34.5 g/dL (ref 30.0–36.0)
MCHC: 34.1 g/dL (ref 30.0–36.0)
MCV: 85.2 fL (ref 78.0–100.0)
MCV: 85.9 fL (ref 78.0–100.0)
Platelets: 164 10*3/uL (ref 150–400)
Platelets: 156 10*3/uL (ref 150–400)
RBC: 3.98 MIL/uL — AB (ref 4.22–5.81)
RBC: 3.76 MIL/uL — AB (ref 4.22–5.81)
RDW: 13.2 % (ref 11.5–15.5)
RDW: 13.3 % (ref 11.5–15.5)
WBC: 8.3 10*3/uL (ref 4.0–10.5)
WBC: 7.8 10*3/uL (ref 4.0–10.5)

## 2016-09-24 LAB — HEMOGLOBIN A1C
Hgb A1c MFr Bld: 9.3 % — ABNORMAL HIGH (ref 4.8–5.6)
Mean Plasma Glucose: 220

## 2016-09-24 LAB — PROTIME-INR
INR: 1.38
PROTHROMBIN TIME: 17 s — AB (ref 11.4–15.2)

## 2016-09-24 LAB — GLUCOSE, CAPILLARY
GLUCOSE-CAPILLARY: 229 mg/dL — AB (ref 65–99)
GLUCOSE-CAPILLARY: 106 mg/dL — AB (ref 65–99)
GLUCOSE-CAPILLARY: 76 mg/dL (ref 65–99)
GLUCOSE-CAPILLARY: 136 mg/dL — AB (ref 65–99)
GLUCOSE-CAPILLARY: 130 mg/dL — AB (ref 65–99)
GLUCOSE-CAPILLARY: 132 mg/dL — AB (ref 65–99)
GLUCOSE-CAPILLARY: 94 mg/dL (ref 65–99)
Glucose-Capillary: 112 mg/dL — ABNORMAL HIGH (ref 65–99)
Glucose-Capillary: 117 mg/dL — ABNORMAL HIGH (ref 65–99)
Glucose-Capillary: 112 mg/dL — ABNORMAL HIGH (ref 65–99)

## 2016-09-24 LAB — HEMOGLOBIN AND HEMATOCRIT, BLOOD
HCT: 30.6 % — ABNORMAL LOW (ref 39.0–52.0)
HEMOGLOBIN: 10.7 g/dL — AB (ref 13.0–17.0)

## 2016-09-24 LAB — APTT: APTT: 34 s (ref 24–36)

## 2016-09-24 LAB — CREATININE, SERUM
Creatinine, Ser: 1.11 mg/dL (ref 0.61–1.24)
GFR calc non Af Amer: >60 mL/min (ref >60)

## 2016-09-24 LAB — MAGNESIUM: MAGNESIUM: 2.6 mg/dL — AB (ref 1.7–2.4)

## 2016-09-24 LAB — PLATELET COUNT: Platelets: 177 10*3/uL (ref 150–400)

## 2016-09-24 SURGERY — CORONARY ARTERY BYPASS GRAFTING (CABG)
Anesthesia: General | Site: Chest

## 2016-09-24 MED ORDER — SODIUM CHLORIDE 0.9 % IV SOLN
30.0000 meq | Freq: Once | INTRAVENOUS | Status: DC
  Filled 2016-09-24: qty 15

## 2016-09-24 MED ORDER — CHLORHEXIDINE GLUCONATE 4 % EX LIQD: 30.0000 mL | CUTANEOUS | Status: DC

## 2016-09-24 MED ORDER — DOCUSATE SODIUM 100 MG PO CAPS
200.0000 mg | ORAL_CAPSULE | Freq: Every day | ORAL | Status: DC
  Administered 2016-09-25 – 2016-09-29 (×4): 200 mg via ORAL
  Filled 2016-09-24 (×6): qty 2

## 2016-09-24 MED ORDER — LACTATED RINGERS IV SOLN: 500.0000 mL | Freq: Once | INTRAVENOUS | Status: DC | PRN

## 2016-09-24 MED ORDER — HEPARIN SODIUM (PORCINE) 1000 UNIT/ML IJ SOLN
INTRAMUSCULAR | Status: DC | PRN
  Administered 2016-09-24: 20000 [IU] via INTRAVENOUS
  Administered 2016-09-24: 2000 [IU] via INTRAVENOUS

## 2016-09-24 MED ORDER — SODIUM CHLORIDE 0.9 % IV SOLN
0.0000 ug/min | INTRAVENOUS | Status: DC
  Administered 2016-09-24: 20 ug/min via INTRAVENOUS
  Filled 2016-09-24 (×3): qty 2

## 2016-09-24 MED ORDER — METOPROLOL TARTRATE 5 MG/5ML IV SOLN: 2.5000 mg | INTRAVENOUS | Status: DC | PRN

## 2016-09-24 MED ORDER — ROCURONIUM BROMIDE 10 MG/ML (PF) SYRINGE
PREFILLED_SYRINGE | INTRAVENOUS | Status: DC | PRN
  Administered 2016-09-24: 20 mg via INTRAVENOUS
  Administered 2016-09-24: 100 mg via INTRAVENOUS
  Administered 2016-09-24 (×2): 50 mg via INTRAVENOUS

## 2016-09-24 MED ORDER — LACTATED RINGERS IV SOLN: INTRAVENOUS | Status: DC

## 2016-09-24 MED ORDER — CHLORHEXIDINE GLUCONATE 0.12 % MT SOLN
15.0000 mL | OROMUCOSAL | Status: AC
Start: 2016-09-24 — End: 2016-09-24
  Administered 2016-09-24: 15 mL via OROMUCOSAL
  Filled 2016-09-24: qty 15

## 2016-09-24 MED ORDER — SODIUM CHLORIDE 0.9 % IV SOLN: 250.0000 mL | INTRAVENOUS | Status: DC

## 2016-09-24 MED ORDER — ARTIFICIAL TEARS OP OINT
TOPICAL_OINTMENT | OPHTHALMIC | Status: AC
Start: 2016-09-24 — End: 2016-09-24
  Filled 2016-09-24: qty 3.5

## 2016-09-24 MED ORDER — 0.9 % SODIUM CHLORIDE (POUR BTL) OPTIME
TOPICAL | Status: DC | PRN
  Administered 2016-09-24: 6000 mL

## 2016-09-24 MED ORDER — ACETAMINOPHEN 500 MG PO TABS
1000.0000 mg | ORAL_TABLET | Freq: Four times a day (QID) | ORAL | Status: DC
  Administered 2016-09-24 – 2016-09-29 (×15): 1000 mg via ORAL
  Filled 2016-09-24 (×14): qty 2

## 2016-09-24 MED ORDER — VANCOMYCIN HCL IN DEXTROSE 1-5 GM/200ML-% IV SOLN
1000.0000 mg | Freq: Once | INTRAVENOUS | Status: AC
  Administered 2016-09-24: 1000 mg via INTRAVENOUS
  Filled 2016-09-24: qty 200

## 2016-09-24 MED ORDER — DEXTROSE 5 % IV SOLN
1.5000 g | Freq: Two times a day (BID) | INTRAVENOUS | Status: AC
  Administered 2016-09-24 – 2016-09-26 (×4): 1.5 g via INTRAVENOUS
  Filled 2016-09-24 (×4): qty 1.5

## 2016-09-24 MED ORDER — DEXMEDETOMIDINE HCL IN NACL 200 MCG/50ML IV SOLN
0.0000 ug/kg/h | INTRAVENOUS | Status: DC
  Administered 2016-09-24: 0.1 ug/kg/h via INTRAVENOUS
  Filled 2016-09-24 (×4): qty 50

## 2016-09-24 MED ORDER — ASPIRIN EC 325 MG PO TBEC
325.0000 mg | DELAYED_RELEASE_TABLET | Freq: Every day | ORAL | Status: DC
  Administered 2016-09-25 – 2016-09-27 (×3): 325 mg via ORAL
  Filled 2016-09-24 (×3): qty 1

## 2016-09-24 MED ORDER — ACETAMINOPHEN 650 MG RE SUPP
650.0000 mg | Freq: Once | RECTAL | Status: AC
  Administered 2016-09-24: 650 mg via RECTAL

## 2016-09-24 MED ORDER — MORPHINE SULFATE (PF) 2 MG/ML IV SOLN
2.0000 mg | INTRAVENOUS | Status: DC | PRN
  Administered 2016-09-24 (×2): 2 mg via INTRAVENOUS
  Administered 2016-09-24 – 2016-09-25 (×2): 4 mg via INTRAVENOUS
  Administered 2016-09-25: 2 mg via INTRAVENOUS
  Filled 2016-09-24 (×2): qty 1
  Filled 2016-09-24: qty 2
  Filled 2016-09-24: qty 1
  Filled 2016-09-24: qty 2

## 2016-09-24 MED ORDER — INSULIN REGULAR BOLUS VIA INFUSION
0.0000 [IU] | Freq: Three times a day (TID) | INTRAVENOUS | Status: DC
  Filled 2016-09-24: qty 10

## 2016-09-24 MED ORDER — SUCCINYLCHOLINE CHLORIDE 200 MG/10ML IV SOSY
PREFILLED_SYRINGE | INTRAVENOUS | Status: AC
  Filled 2016-09-24: qty 10

## 2016-09-24 MED ORDER — PROTAMINE SULFATE 10 MG/ML IV SOLN
INTRAVENOUS | Status: DC | PRN
  Administered 2016-09-24: 200 mg via INTRAVENOUS

## 2016-09-24 MED ORDER — ACETAMINOPHEN 160 MG/5ML PO SOLN: 650.0000 mg | Freq: Once | ORAL | Status: AC

## 2016-09-24 MED ORDER — ORAL CARE MOUTH RINSE
15.0000 mL | Freq: Two times a day (BID) | OROMUCOSAL | Status: DC
  Administered 2016-09-24 – 2016-09-27 (×4): 15 mL via OROMUCOSAL

## 2016-09-24 MED ORDER — LIDOCAINE 2% (20 MG/ML) 5 ML SYRINGE
INTRAMUSCULAR | Status: AC
  Filled 2016-09-24: qty 5

## 2016-09-24 MED ORDER — MORPHINE SULFATE (PF) 2 MG/ML IV SOLN: 1.0000 mg | INTRAVENOUS | Status: AC | PRN

## 2016-09-24 MED ORDER — SODIUM CHLORIDE 0.45 % IV SOLN
INTRAVENOUS | Status: DC | PRN
  Administered 2016-09-24: 20 mL/h via INTRAVENOUS

## 2016-09-24 MED ORDER — SODIUM CHLORIDE 0.9 % IJ SOLN
INTRAMUSCULAR | Status: AC
  Filled 2016-09-24: qty 10

## 2016-09-24 MED ORDER — MIDAZOLAM HCL 5 MG/5ML IJ SOLN
INTRAMUSCULAR | Status: DC | PRN
  Administered 2016-09-24: 1 mg via INTRAVENOUS
  Administered 2016-09-24: 2 mg via INTRAVENOUS

## 2016-09-24 MED ORDER — NITROGLYCERIN IN D5W 200-5 MCG/ML-% IV SOLN: 0.0000 ug/min | INTRAVENOUS | Status: DC

## 2016-09-24 MED ORDER — ONDANSETRON HCL 4 MG/2ML IJ SOLN: 4.0000 mg | Freq: Four times a day (QID) | INTRAMUSCULAR | Status: DC | PRN

## 2016-09-24 MED ORDER — METOPROLOL TARTRATE 12.5 MG HALF TABLET
12.5000 mg | ORAL_TABLET | Freq: Two times a day (BID) | ORAL | Status: DC
  Administered 2016-09-26 – 2016-09-29 (×6): 12.5 mg via ORAL
  Filled 2016-09-24 (×7): qty 1

## 2016-09-24 MED ORDER — BISACODYL 5 MG PO TBEC
10.0000 mg | DELAYED_RELEASE_TABLET | Freq: Every day | ORAL | Status: DC
  Administered 2016-09-25 – 2016-09-29 (×4): 10 mg via ORAL
  Filled 2016-09-24 (×5): qty 2

## 2016-09-24 MED ORDER — LIDOCAINE 2% (20 MG/ML) 5 ML SYRINGE
INTRAMUSCULAR | Status: DC | PRN
  Administered 2016-09-24: 100 mg via INTRAVENOUS

## 2016-09-24 MED ORDER — SODIUM CHLORIDE 0.9% FLUSH
3.0000 mL | INTRAVENOUS | Status: DC | PRN
  Administered 2016-09-29: 3 mL via INTRAVENOUS
  Filled 2016-09-24: qty 3

## 2016-09-24 MED ORDER — SODIUM CHLORIDE 0.9 % IJ SOLN
OROMUCOSAL | Status: DC | PRN
  Administered 2016-09-24 (×3): 4 mL via TOPICAL

## 2016-09-24 MED ORDER — PROPOFOL 10 MG/ML IV BOLUS
INTRAVENOUS | Status: AC
  Filled 2016-09-24: qty 20

## 2016-09-24 MED ORDER — EPHEDRINE 5 MG/ML INJ
INTRAVENOUS | Status: AC
Start: 2016-09-24 — End: 2016-09-24
  Filled 2016-09-24: qty 10

## 2016-09-24 MED ORDER — ROCURONIUM BROMIDE 50 MG/5ML IV SOSY
PREFILLED_SYRINGE | INTRAVENOUS | Status: AC
  Filled 2016-09-24: qty 5

## 2016-09-24 MED ORDER — ALBUMIN HUMAN 5 % IV SOLN
250.0000 mL | INTRAVENOUS | Status: AC | PRN
  Administered 2016-09-24 (×3): 250 mL via INTRAVENOUS
  Filled 2016-09-24: qty 250

## 2016-09-24 MED ORDER — HEMOSTATIC AGENTS (NO CHARGE) OPTIME
TOPICAL | Status: DC | PRN
  Administered 2016-09-24: 1 via TOPICAL

## 2016-09-24 MED ORDER — LACTATED RINGERS IV SOLN
INTRAVENOUS | Status: DC | PRN
  Administered 2016-09-24 (×2): via INTRAVENOUS

## 2016-09-24 MED ORDER — LACTATED RINGERS IV SOLN
INTRAVENOUS | Status: DC | PRN
Start: 2016-09-24 — End: 2016-09-24
  Administered 2016-09-24 (×2): via INTRAVENOUS

## 2016-09-24 MED ORDER — FENTANYL CITRATE (PF) 250 MCG/5ML IJ SOLN
INTRAMUSCULAR | Status: AC
  Filled 2016-09-24: qty 30

## 2016-09-24 MED ORDER — PANTOPRAZOLE SODIUM 40 MG PO TBEC
40.0000 mg | DELAYED_RELEASE_TABLET | Freq: Every day | ORAL | Status: DC
  Administered 2016-09-26 – 2016-09-29 (×4): 40 mg via ORAL
  Filled 2016-09-24 (×4): qty 1

## 2016-09-24 MED ORDER — BISACODYL 10 MG RE SUPP: 10.0000 mg | Freq: Every day | RECTAL | Status: DC

## 2016-09-24 MED ORDER — ORAL CARE MOUTH RINSE: 15.0000 mL | Freq: Four times a day (QID) | OROMUCOSAL | Status: DC

## 2016-09-24 MED ORDER — MIDAZOLAM HCL 2 MG/2ML IJ SOLN
2.0000 mg | INTRAMUSCULAR | Status: DC | PRN
Start: 2016-09-24 — End: 2016-09-26

## 2016-09-24 MED ORDER — SODIUM CHLORIDE 0.9% FLUSH
3.0000 mL | Freq: Two times a day (BID) | INTRAVENOUS | Status: DC
  Administered 2016-09-25 – 2016-09-29 (×6): 3 mL via INTRAVENOUS

## 2016-09-24 MED ORDER — ACETAMINOPHEN 160 MG/5ML PO SOLN: 1000.0000 mg | Freq: Four times a day (QID) | ORAL | Status: DC

## 2016-09-24 MED ORDER — ALBUMIN HUMAN 5 % IV SOLN
INTRAVENOUS | Status: DC | PRN
Start: 2016-09-24 — End: 2016-09-24
  Administered 2016-09-24: 13:00:00 via INTRAVENOUS

## 2016-09-24 MED ORDER — OXYCODONE HCL 5 MG PO TABS
5.0000 mg | ORAL_TABLET | ORAL | Status: DC | PRN
  Administered 2016-09-24: 10 mg via ORAL
  Administered 2016-09-24: 5 mg via ORAL
  Administered 2016-09-25 – 2016-09-28 (×8): 10 mg via ORAL
  Filled 2016-09-24 (×4): qty 2
  Filled 2016-09-24: qty 1
  Filled 2016-09-24 (×6): qty 2

## 2016-09-24 MED ORDER — CHLORHEXIDINE GLUCONATE 0.12% ORAL RINSE (MEDLINE KIT): 15.0000 mL | Freq: Two times a day (BID) | OROMUCOSAL | Status: DC

## 2016-09-24 MED ORDER — FAMOTIDINE IN NACL 20-0.9 MG/50ML-% IV SOLN
20.0000 mg | Freq: Two times a day (BID) | INTRAVENOUS | Status: DC
  Administered 2016-09-24: 20 mg via INTRAVENOUS

## 2016-09-24 MED ORDER — MIDAZOLAM HCL 10 MG/2ML IJ SOLN
INTRAMUSCULAR | Status: AC
  Filled 2016-09-24: qty 2

## 2016-09-24 MED ORDER — FENTANYL CITRATE (PF) 250 MCG/5ML IJ SOLN
INTRAMUSCULAR | Status: DC | PRN
  Administered 2016-09-24: 250 ug via INTRAVENOUS
  Administered 2016-09-24: 100 ug via INTRAVENOUS
  Administered 2016-09-24: 150 ug via INTRAVENOUS
  Administered 2016-09-24: 100 ug via INTRAVENOUS
  Administered 2016-09-24 (×2): 250 ug via INTRAVENOUS
  Administered 2016-09-24: 100 ug via INTRAVENOUS
  Administered 2016-09-24: 200 ug via INTRAVENOUS
  Administered 2016-09-24: 100 ug via INTRAVENOUS

## 2016-09-24 MED ORDER — MAGNESIUM SULFATE 4 GM/100ML IV SOLN
4.0000 g | Freq: Once | INTRAVENOUS | Status: AC
  Administered 2016-09-24: 4 g via INTRAVENOUS
  Filled 2016-09-24: qty 100

## 2016-09-24 MED ORDER — ASPIRIN 81 MG PO CHEW
324.0000 mg | CHEWABLE_TABLET | Freq: Every day | ORAL | Status: DC
  Filled 2016-09-24: qty 4

## 2016-09-24 MED ORDER — METOPROLOL TARTRATE 25 MG/10 ML ORAL SUSPENSION
12.5000 mg | Freq: Two times a day (BID) | ORAL | Status: DC
  Filled 2016-09-24: qty 10

## 2016-09-24 MED ORDER — SODIUM CHLORIDE 0.9 % IV SOLN
INTRAVENOUS | Status: DC
  Administered 2016-09-24: 3.4 [IU]/h via INTRAVENOUS
  Filled 2016-09-24 (×2): qty 2.5

## 2016-09-24 MED ORDER — SODIUM CHLORIDE 0.9 % IV SOLN: INTRAVENOUS | Status: DC

## 2016-09-24 MED ORDER — PHENYLEPHRINE 40 MCG/ML (10ML) SYRINGE FOR IV PUSH (FOR BLOOD PRESSURE SUPPORT)
PREFILLED_SYRINGE | INTRAVENOUS | Status: AC
  Filled 2016-09-24: qty 10

## 2016-09-24 MED ORDER — PROPOFOL 10 MG/ML IV BOLUS
INTRAVENOUS | Status: DC | PRN
  Administered 2016-09-24: 90 mg via INTRAVENOUS

## 2016-09-24 MED ORDER — TRAMADOL HCL 50 MG PO TABS
50.0000 mg | ORAL_TABLET | ORAL | Status: DC | PRN
  Administered 2016-09-26 (×2): 100 mg via ORAL
  Administered 2016-09-26: 50 mg via ORAL
  Administered 2016-09-27 (×2): 100 mg via ORAL
  Filled 2016-09-24: qty 2
  Filled 2016-09-24: qty 1
  Filled 2016-09-24 (×3): qty 2

## 2016-09-24 MED ORDER — LACTATED RINGERS IV SOLN
INTRAVENOUS | Status: DC | PRN
  Administered 2016-09-24: 08:00:00 via INTRAVENOUS

## 2016-09-24 MED FILL — Potassium Chloride Inj 2 mEq/ML: INTRAVENOUS | Qty: 40 | Status: AC

## 2016-09-24 MED FILL — Magnesium Sulfate Inj 50%: INTRAMUSCULAR | Qty: 10 | Status: AC

## 2016-09-24 MED FILL — Heparin Sodium (Porcine) Inj 1000 Unit/ML: INTRAMUSCULAR | Qty: 30 | Status: AC

## 2016-09-24 SURGICAL SUPPLY — 110 items
BAG DECANTER FOR FLEXI CONT (MISCELLANEOUS) ×4 IMPLANT
BANDAGE ACE 4X5 VEL STRL LF (GAUZE/BANDAGES/DRESSINGS) ×4 IMPLANT
BANDAGE ACE 6X5 VEL STRL LF (GAUZE/BANDAGES/DRESSINGS) ×4 IMPLANT
BASKET HEART  (ORDER IN 25'S) (MISCELLANEOUS) ×1
BASKET HEART (ORDER IN 25'S) (MISCELLANEOUS) ×1
BASKET HEART (ORDER IN 25S) (MISCELLANEOUS) ×2 IMPLANT
BLADE STERNUM SYSTEM 6 (BLADE) ×4 IMPLANT
BNDG GAUZE ELAST 4 BULKY (GAUZE/BANDAGES/DRESSINGS) ×4 IMPLANT
CANISTER SUCTION 2500CC (MISCELLANEOUS) ×4 IMPLANT
CANNULA EZ GLIDE AORTIC 21FR (CANNULA) ×4 IMPLANT
CATH CPB KIT HENDRICKSON (MISCELLANEOUS) ×4 IMPLANT
CATH ROBINSON RED A/P 18FR (CATHETERS) ×4 IMPLANT
CATH THORACIC 36FR (CATHETERS) ×4 IMPLANT
CATH THORACIC 36FR RT ANG (CATHETERS) ×4 IMPLANT
CLIP RETRACTION 3.0MM CORONARY (MISCELLANEOUS) ×4 IMPLANT
CLIP TI MEDIUM 24 (CLIP) IMPLANT
CLIP TI WIDE RED SMALL 24 (CLIP) ×8 IMPLANT
CRADLE DONUT ADULT HEAD (MISCELLANEOUS) ×4 IMPLANT
DRAPE CARDIOVASCULAR INCISE (DRAPES) ×2
DRAPE SLUSH/WARMER DISC (DRAPES) ×4 IMPLANT
DRAPE SRG 135X102X78XABS (DRAPES) ×2 IMPLANT
DRSG COVADERM 4X14 (GAUZE/BANDAGES/DRESSINGS) ×4 IMPLANT
ELECT REM PT RETURN 9FT ADLT (ELECTROSURGICAL) ×8
ELECTRODE REM PT RTRN 9FT ADLT (ELECTROSURGICAL) ×4 IMPLANT
FELT TEFLON 1X6 (MISCELLANEOUS) ×4 IMPLANT
GAUZE SPONGE 4X4 12PLY STRL (GAUZE/BANDAGES/DRESSINGS) ×8 IMPLANT
GLOVE BIO SURGEON STRL SZ 6.5 (GLOVE) ×6 IMPLANT
GLOVE BIO SURGEON STRL SZ7.5 (GLOVE) ×8 IMPLANT
GLOVE BIO SURGEONS STRL SZ 6.5 (GLOVE) ×2
GLOVE BIOGEL PI IND STRL 6 (GLOVE) ×6 IMPLANT
GLOVE BIOGEL PI IND STRL 6.5 (GLOVE) ×8 IMPLANT
GLOVE BIOGEL PI IND STRL 7.0 (GLOVE) ×2 IMPLANT
GLOVE BIOGEL PI INDICATOR 6 (GLOVE) ×6
GLOVE BIOGEL PI INDICATOR 6.5 (GLOVE) ×8
GLOVE BIOGEL PI INDICATOR 7.0 (GLOVE) ×2
GLOVE SURG SIGNA 7.5 PF LTX (GLOVE) ×12 IMPLANT
GOWN STRL REUS W/ TWL LRG LVL3 (GOWN DISPOSABLE) ×14 IMPLANT
GOWN STRL REUS W/ TWL XL LVL3 (GOWN DISPOSABLE) ×4 IMPLANT
GOWN STRL REUS W/TWL LRG LVL3 (GOWN DISPOSABLE) ×14
GOWN STRL REUS W/TWL XL LVL3 (GOWN DISPOSABLE) ×4
HEMOSTAT POWDER SURGIFOAM 1G (HEMOSTASIS) ×12 IMPLANT
HEMOSTAT SURGICEL 2X14 (HEMOSTASIS) ×4 IMPLANT
INSERT FOGARTY XLG (MISCELLANEOUS) IMPLANT
KIT BASIN OR (CUSTOM PROCEDURE TRAY) ×4 IMPLANT
KIT ROOM TURNOVER OR (KITS) ×4 IMPLANT
KIT SUCTION CATH 14FR (SUCTIONS) ×8 IMPLANT
KIT VASOVIEW HEMOPRO VH 3000 (KITS) ×4 IMPLANT
MARKER GRAFT CORONARY BYPASS (MISCELLANEOUS) ×12 IMPLANT
NS IRRIG 1000ML POUR BTL (IV SOLUTION) ×24 IMPLANT
PACK OPEN HEART (CUSTOM PROCEDURE TRAY) ×4 IMPLANT
PAD ARMBOARD 7.5X6 YLW CONV (MISCELLANEOUS) ×8 IMPLANT
PAD ELECT DEFIB RADIOL ZOLL (MISCELLANEOUS) ×4 IMPLANT
PENCIL BUTTON HOLSTER BLD 10FT (ELECTRODE) ×4 IMPLANT
PUNCH AORTIC ROTATE  4.5MM 8IN (MISCELLANEOUS) ×4 IMPLANT
PUNCH AORTIC ROTATE 4.0MM (MISCELLANEOUS) IMPLANT
PUNCH AORTIC ROTATE 4.5MM 8IN (MISCELLANEOUS) IMPLANT
PUNCH AORTIC ROTATE 5MM 8IN (MISCELLANEOUS) IMPLANT
SET CARDIOPLEGIA MPS 5001102 (MISCELLANEOUS) ×4 IMPLANT
SPONGE GAUZE 4X4 12PLY STER LF (GAUZE/BANDAGES/DRESSINGS) ×8 IMPLANT
SPONGE LAP 18X18 X RAY DECT (DISPOSABLE) ×4 IMPLANT
SPONGE LAP 4X18 X RAY DECT (DISPOSABLE) ×4 IMPLANT
SUT BONE WAX W31G (SUTURE) ×4 IMPLANT
SUT ETHIBOND 2 0 SH (SUTURE) ×8
SUT ETHIBOND 2 0 SH 36X2 (SUTURE) ×8 IMPLANT
SUT MNCRL AB 4-0 PS2 18 (SUTURE) IMPLANT
SUT PROLENE 3 0 SH DA (SUTURE) ×4 IMPLANT
SUT PROLENE 4 0 RB 1 (SUTURE) ×2
SUT PROLENE 4 0 SH DA (SUTURE) IMPLANT
SUT PROLENE 4-0 RB1 .5 CRCL 36 (SUTURE) ×2 IMPLANT
SUT PROLENE 5 0 C 1 36 (SUTURE) ×8 IMPLANT
SUT PROLENE 6 0 C 1 30 (SUTURE) ×20 IMPLANT
SUT PROLENE 7 0 BV 1 (SUTURE) ×16 IMPLANT
SUT PROLENE 7 0 BV1 MDA (SUTURE) ×4 IMPLANT
SUT PROLENE 8 0 BV175 6 (SUTURE) ×8 IMPLANT
SUT SILK  1 MH (SUTURE) ×6
SUT SILK 1 MH (SUTURE) ×6 IMPLANT
SUT SILK 1 TIES 10X30 (SUTURE) ×4 IMPLANT
SUT SILK 2 0 SH CR/8 (SUTURE) ×8 IMPLANT
SUT SILK 2 0 TIES 10X30 (SUTURE) ×4 IMPLANT
SUT SILK 2 0 TIES 17X18 (SUTURE) ×2
SUT SILK 2-0 18XBRD TIE BLK (SUTURE) ×2 IMPLANT
SUT SILK 3 0 SH CR/8 (SUTURE) ×4 IMPLANT
SUT SILK 4 0 TIE 10X30 (SUTURE) ×8 IMPLANT
SUT STEEL 6MS V (SUTURE) ×4 IMPLANT
SUT STEEL STERNAL CCS#1 18IN (SUTURE) IMPLANT
SUT STEEL SZ 6 DBL 3X14 BALL (SUTURE) ×4 IMPLANT
SUT TEM PAC WIRE 2 0 SH (SUTURE) ×16 IMPLANT
SUT VIC AB 1 CTX 36 (SUTURE) ×6
SUT VIC AB 1 CTX36XBRD ANBCTR (SUTURE) ×6 IMPLANT
SUT VIC AB 2-0 CT1 27 (SUTURE) ×2
SUT VIC AB 2-0 CT1 TAPERPNT 27 (SUTURE) ×2 IMPLANT
SUT VIC AB 2-0 CTX 27 (SUTURE) ×8 IMPLANT
SUT VIC AB 3-0 SH 27 (SUTURE)
SUT VIC AB 3-0 SH 27X BRD (SUTURE) IMPLANT
SUT VIC AB 3-0 X1 27 (SUTURE) ×12 IMPLANT
SUT VICRYL 4-0 PS2 18IN ABS (SUTURE) IMPLANT
SUTURE E-PAK OPEN HEART (SUTURE) IMPLANT
SYSTEM SAHARA CHEST DRAIN ATS (WOUND CARE) ×4 IMPLANT
TAPE CLOTH SURG 4X10 WHT LF (GAUZE/BANDAGES/DRESSINGS) ×4 IMPLANT
TAPE PAPER 2X10 WHT MICROPORE (GAUZE/BANDAGES/DRESSINGS) ×4 IMPLANT
TOWEL GREEN STERILE (TOWEL DISPOSABLE) IMPLANT
TOWEL GREEN STERILE FF (TOWEL DISPOSABLE) ×4 IMPLANT
TOWEL OR 17X24 6PK STRL BLUE (TOWEL DISPOSABLE) IMPLANT
TOWEL OR 17X26 10 PK STRL BLUE (TOWEL DISPOSABLE) IMPLANT
TRAY FOLEY IC TEMP SENS 16FR (CATHETERS) ×4 IMPLANT
TUBE FEEDING 8FR 16IN STR KANG (MISCELLANEOUS) ×4 IMPLANT
TUBE SUCT INTRACARD DLP 20F (MISCELLANEOUS) ×4 IMPLANT
TUBING INSUFFLATION (TUBING) ×4 IMPLANT
UNDERPAD 30X30 (UNDERPADS AND DIAPERS) ×4 IMPLANT
WATER STERILE IRR 1000ML POUR (IV SOLUTION) ×8 IMPLANT

## 2016-09-24 NOTE — Transfer of Care (Signed)
Immediate Anesthesia Transfer of Care Note  Patient: Stuart Henry  Procedure(s) Performed: Procedure(s): CORONARY ARTERY BYPASS GRAFTING (CABG) x four, using left internal mammary artery and right leg greater saphenous vein harvested endoscopically (N/A) TRANSESOPHAGEAL ECHOCARDIOGRAM (TEE) (N/A)  Patient Location: SICU  Anesthesia Type:General  Level of Consciousness: Patient remains intubated per anesthesia plan  Airway & Oxygen Therapy: Patient remains intubated per anesthesia plan and Patient placed on Ventilator (see vital sign flow sheet for setting)  Post-op Assessment: Report given to RN and Post -op Vital signs reviewed and stable  Post vital signs: Reviewed and stable  Last Vitals:  Vitals:   09/24/16 0709 09/24/16 1350  BP: 131/80   Pulse: (!) 59 (!) 122  Resp: 20 12  Temp: 36.6 C     Last Pain: There were no vitals filed for this visit.       Complications: No apparent anesthesia complications

## 2016-09-24 NOTE — Progress Notes (Signed)
Continuing SICU rapid wean per protocol.

## 2016-09-24 NOTE — Interval H&P Note (Signed)
History and Physical Interval Note:  09/24/2016 8:20 AM  Stuart Henry  has presented today for surgery, with the diagnosis of CAD  The various methods of treatment have been discussed with the patient and family. After consideration of risks, benefits and other options for treatment, the patient has consented to  Procedure(s): CORONARY ARTERY BYPASS GRAFTING (CABG) (N/A) TRANSESOPHAGEAL ECHOCARDIOGRAM (TEE) (N/A) as a surgical intervention .  The patient's history has been reviewed, patient examined, no change in status, stable for surgery.  I have reviewed the patient's chart and labs.  Questions were answered to the patient's satisfaction.     Melrose Nakayama

## 2016-09-24 NOTE — Anesthesia Procedure Notes (Signed)
Procedure Name: Intubation Performed by: Izora Gala Pre-anesthesia Checklist: Patient identified, Emergency Drugs available, Suction available and Patient being monitored Patient Re-evaluated:Patient Re-evaluated prior to inductionOxygen Delivery Method: Circle system utilized Preoxygenation: Pre-oxygenation with 100% oxygen Intubation Type: IV induction Ventilation: Mask ventilation without difficulty Laryngoscope Size: Miller and 3 Grade View: Grade I Tube type: Oral Tube size: 8.0 mm Number of attempts: 1 Airway Equipment and Method: Stylet Placement Confirmation: ETT inserted through vocal cords under direct vision,  positive ETCO2 and breath sounds checked- equal and bilateral Secured at: 23 cm Tube secured with: Tape Dental Injury: Teeth and Oropharynx as per pre-operative assessment

## 2016-09-24 NOTE — OR Nursing (Signed)
12:40 - 45 minute call to SICU charge nurse 13:10 - 20 minute call to SICU charge nurse

## 2016-09-24 NOTE — Anesthesia Postprocedure Evaluation (Addendum)
Anesthesia Post Note  Patient: Stuart Henry  Procedure(s) Performed: Procedure(s) (LRB): CORONARY ARTERY BYPASS GRAFTING (CABG) x four, using left internal mammary artery and right leg greater saphenous vein harvested endoscopically (N/A) TRANSESOPHAGEAL ECHOCARDIOGRAM (TEE) (N/A)  Patient location during evaluation: SICU Anesthesia Type: General Level of consciousness: sedated Pain management: pain level controlled Vital Signs Assessment: post-procedure vital signs reviewed and stable Respiratory status: patient remains intubated per anesthesia plan Cardiovascular status: stable Anesthetic complications: no       Last Vitals:  Vitals:   09/24/16 0709 09/24/16 1350  BP: 131/80   Pulse: (!) 59 (!) 122  Resp: 20 12  Temp: 36.6 C     Last Pain: There were no vitals filed for this visit.               [place] Latulippe DANIEL

## 2016-09-24 NOTE — H&P (View-Only) (Signed)
PCP is [name], MD Referring Provider is End, Harrell Gave, MD  Chief Complaint  Patient presents with  . Coronary Artery Disease    eval for CABG...CATHED @ [facility] 09/19/16, ECHO 09/16/16    HPI: 76 yo man presents with a cc/o exertional CP.  Mr. 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 About 2 weeks ago while walking up an incline he noted sudden onset of a soreness in his chest. This was associated with shortness of breath, but no nausea, vomiting, or diaphoresis. It resolved with rest. That happened to him 3 days in a row. He went to eye Dr. Caryn Section and was referred to Dr. Saunders Revel.  Dr. Saunders Revel performed cardiac catheterization on 09/16/2016. It revealed severe two-vessel coronary disease with a 95% ostial LAD lesion involving the takeoff of a high diagonal branch. There also was a 90% stenosis in the ramus intermedius involving both portions of the bifurcation. There was a 50% stenosis in the small AV groove circumflex. There was no significant disease in the right coronary. An echocardiogram revealed no significant valvular pathology. There was an ejection fraction of 45-50%.  He has not had any rest or nocturnal pain. He has had some dizzy spells recently. He says they're in frequent and usually when he stands up quickly. His blood sugars have been more difficult to manage recently. He does have diabetic neuropathy in his feet. He remains physically active although he's had to cut back due to his recent onset of chest pain.   Past Medical History:  Diagnosis Date  . Coronary artery disease   . Diabetes mellitus without complication ([place])   . Hypertension     Past Surgical History:  Procedure Laterality Date  . [place], No stent  . COLONOSCOPY WITH PROPOFOL N/A 02/09/2015   Procedure: COLONOSCOPY  WITH PROPOFOL;  Surgeon: Manya Silvas, MD;  Location: [facility] ENDOSCOPY;  Service: Endoscopy;  Laterality: N/A;  . KNEE SURGERY  2010   to correct a severed patella tendon  . LEFT HEART CATH AND CORONARY ANGIOGRAPHY Left 09/16/2016   Procedure: Left Heart Cath and Coronary Angiography;  Surgeon: Nelva Bush, MD;  Location: [facility] CV LAB;  Service: Cardiovascular;  Laterality: Left;  . TOTAL KNEE ARTHROPLASTY  2010    Family History  Problem Relation Age of Onset  . Dementia Mother   . Diabetes Mother   . Cancer Father   . Diabetes Sister   . Diabetes Brother   . Heart disease Other     Social History Social History  Substance Use Topics  . Smoking status: Former Smoker    Packs/day: 1.00    Years: 5.00    Types: Cigarettes    Quit date: 08/06/1972  . Smokeless tobacco: Never Used  . Alcohol use No    Current Outpatient Prescriptions  Medication Sig Dispense Refill  . ACCU-CHEK AVIVA PLUS test strip Check blood sugar once  daily 100 each 4  . allopurinol (ZYLOPRIM) 100 MG tablet TAKE 1 TABLET DAILY 90 tablet 4  . aspirin 81 MG tablet Take 81 mg by mouth at bedtime.     Marland Kitchen atorvastatin (LIPITOR) 40 MG tablet Take 1 tablet (40 mg total) by mouth daily. 30 tablet 5  . benazepril-hydrochlorthiazide (LOTENSIN HCT) 20-12.5 MG tablet TAKE 1 TABLET DAILY 90 tablet 3  .  Blood Glucose Monitoring Suppl (ACCU-CHEK AVIVA PLUS) w/Device KIT Use to check blood sugar daily as directed 1 kit 0  . empagliflozin (JARDIANCE) 25 MG TABS tablet Take 25 mg by mouth daily. 30 tablet 6  . folic acid (FOLVITE) 941 MCG tablet Take 800 mcg by mouth daily.    Marland Kitchen gabapentin (NEURONTIN) 300 MG capsule TAKE ONE CAPSULE BY MOUTH AT BEDTIME FOR NEUROPATHY 90 capsule 4  . glipiZIDE (GLUCOTROL XL) 10 MG 24 hr tablet Take 1 tablet (10 mg total) by mouth daily with breakfast. 90 tablet 3  . nitroGLYCERIN (NITROSTAT) 0.4 MG SL tablet Place 1 tablet (0.4 mg total) under the tongue every 5 (five) minutes as  needed for chest pain. 35 tablet 2  . pioglitazone (ACTOS) 45 MG tablet Take 1 tablet (45 mg total) by mouth daily. 1 tablet 1  . repaglinide (PRANDIN) 1 MG tablet TAKE ONE TABLET BY MOUTH UP TO 3 TIMES DAILY BEFORE MEALS FOR BLOOD SUGAR 30 tablet 12  . vitamin B-12 (CYANOCOBALAMIN) 1000 MCG tablet Take 1,000 mcg by mouth daily.    . fluticasone (FLONASE) 50 MCG/ACT nasal spray Place 2 sprays into both nostrils daily. (Patient taking differently: Place 2 sprays into both nostrils daily as needed for allergies. ) 16 g 6   No current facility-administered medications for this visit.     No Known Allergies  Review of Systems  Constitutional: Positive for fatigue and unexpected weight change (Has lost 6 pounds in 3 months). Negative for appetite change.  HENT: Positive for hearing loss. Negative for trouble swallowing and voice change.   Eyes: Positive for visual disturbance (Cataract surgery).  Cardiovascular: Positive for chest pain. Negative for leg swelling.  Gastrointestinal: Negative for abdominal pain and blood in stool.       Hiatal hernia  Genitourinary: Positive for frequency. Negative for dysuria.  Musculoskeletal: Negative for back pain and joint swelling.  Neurological: Positive for dizziness and numbness (and paresthesias both feet).  Hematological: Negative for adenopathy. Does not bruise/bleed easily.  All other systems reviewed and are negative.   BP 116/73   Pulse 83   Resp 20   Ht _0  (1.93 m)   Wt 216 lb (98 kg)   SpO2 98%   BMI 26.29 kg/m  Physical Exam  Constitutional: He is oriented to person, place, and time. He appears well-developed and well-nourished. No distress.  HENT:  Head: Normocephalic and atraumatic.  Eyes: Conjunctivae are normal. No scleral icterus.  Neck: Neck supple. No thyromegaly present.  No carotid bruits  Cardiovascular: Normal rate, regular rhythm, normal heart sounds and intact distal pulses.  Exam reveals no gallop and no friction  rub.   No murmur heard. Pulmonary/Chest: Effort normal and breath sounds normal. No respiratory distress. He has no wheezes. He has no rales.  Abdominal: Soft. He exhibits no distension. There is no tenderness.  Musculoskeletal: He exhibits no edema.  Lymphadenopathy:    He has no cervical adenopathy.  Neurological: He is alert and oriented to person, place, and time. No cranial nerve deficit.  Motor 5/5 bilaterally  Skin: Skin is warm and dry.  Vitals reviewed.    Diagnostic Tests: CARDIAC CATHETERIZATION Conclusion   Conclusions: 1. Significant two-vessel coronary artery disease, including 95% proximal LAD stenosis involving ostium of D1 (90%), 90% medium-caliber ramus intermedius, stenosis, and sequential ostial and mid LCx lesions. 2. Mild CAD involving distal RCA. 3. Normal left ventricular filling pressure and left ventricular contraction (LVEF 55-65%).  Recommendations: 1. Given two  vessel CAD (including proximal LAD) and history of diabetes mellitus, recommend surgical consultation for CABG. 2. Initiate atorvastatin 40 mg daily and isosorbide mononitrate 30 mg daily. 3. Recheck BMP in 2-3 days; if renal function is stable, restart metformin. 4. Obtain transthoracic echocardiogram later this week.  Nelva Bush, MD [place] Pager: [phone]   ECHOCARDIOGRAM Study Conclusions  - Left ventricle: The cavity size was normal. Systolic function was   mildly reduced. The estimated ejection fraction was in the range   of 45% to 50%. Hypokinesis of the basal to mid anterior and   anteroseptal myocardium. Doppler parameters are consistent with   abnormal left ventricular relaxation (grade 1 diastolic   dysfunction). - Left atrium: The atrium was mildly dilated. - Right ventricle: Systolic function was normal. - Pulmonary arteries: Systolic pressure was within the normal   range. 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 He has severe 2 vessel CAD and mild to moderate LV dysfunction with an EF of 45%. CABG is indicated for survival benefit and relief of symptoms.  I reviewed the cardiac catheterization images with Mr. and Mrs. Caudill.  I discussed the general nature of the procedure, the need for general anesthesia, the use of cardiopulmonary bypass, and the incisions to be used with Mr and Mrs Geier. We discussed the expected hospital stay, overall recovery and short and long term outcomes. I informed them of the indications, risks, benefits and alternatives. They understand the risks include, but are not limited to death, stroke, MI, DVT/PE, bleeding, possible need for transfusion, infections,other organ system dysfunction including respiratory, renal, or GI complications.   He accepts the risks and agrees to proceed.  Diabetes- he is concerned that statin is making his blood sugars heart control. We will observe that closely while he is in the hospital.  Plan: CABG on Wednesday 09/24/2016  Melrose Nakayama, MD [facility] [phone]

## 2016-09-24 NOTE — Brief Op Note (Addendum)
09/24/2016  12:28 PM  PATIENT:  Stuart Henry  76 y.o. male  PRE-OPERATIVE DIAGNOSIS:  CAD  POST-OPERATIVE DIAGNOSIS:  CAD  PROCEDURE:  Procedure(s): CORONARY ARTERY BYPASS GRAFTING (CABG) x four, using left internal mammary artery and right leg greater saphenous vein harvested endoscopically (N/A) TRANSESOPHAGEAL ECHOCARDIOGRAM (TEE) (N/A)  LIMA to LAD  SEQ SVG to RAMUS 1 and RAMUS 2  SVG to DIAGONAL  SURGEON:  Surgeon(s) and Role:    * Melrose Nakayama, MD - Primary  PHYSICIAN ASSISTANT: WAYNE GOLD PA-C  ANESTHESIA:   general  EBL:  Total I/O In: 1500 [I.V.:1500] Out: 450 [Urine:350; Blood:100]  BLOOD ADMINISTERED:none  DRAINS: ROUTINE   LOCAL MEDICATIONS USED:  NONE  SPECIMEN:  No Specimen  DISPOSITION OF SPECIMEN:  N/A  COUNTS:  YES  PLAN OF CARE: Admit to inpatient   PATIENT DISPOSITION:  ICU - intubated and hemodynamically stable.   Delay start of Pharmacological VTE agent (>24hrs) due to surgical blood loss or risk of bleeding: yes  COMPLICATIONS: NO KNOWN  XC= 68 min CPB= 101 min Good targets but LAD and Ramus 2 intramyocardial Good conduits

## 2016-09-24 NOTE — Anesthesia Procedure Notes (Addendum)
Central Venous Catheter Insertion Performed by: Duane Boston, anesthesiologist Start/End2/21/2018 7:58 AM, 09/24/2016 8:07 AM Patient location: Pre-op. Preanesthetic checklist: patient identified, IV checked, site marked, risks and benefits discussed, surgical consent, monitors and equipment checked, pre-op evaluation, timeout performed and anesthesia consent Position: Trendelenburg Lidocaine 1% used for infiltration and patient sedated Hand hygiene performed , maximum sterile barriers used  and Seldinger technique used Catheter size: 8.5 Fr Total catheter length 8. PA cath was placed.Sheath introducer Swan type:thermodilution PA Cath depth:50 Procedure performed using ultrasound guided technique. Ultrasound Notes:anatomy identified, needle tip was noted to be adjacent to the nerve/plexus identified, no ultrasound evidence of intravascular and/or intraneural injection and image(s) printed for medical record Attempts: 1 Following insertion, line sutured and dressing applied. Post procedure assessment: free fluid flow, blood return through all ports and no air  Patient tolerated the procedure well with no immediate complications.

## 2016-09-24 NOTE — Progress Notes (Signed)
      [address]       [place] [address]             [phone]      Extubated  Alert, neuro intact  BP 105/76   Pulse 88   Temp 100 F (37.8 C)   Resp 17   SpO2 100%   CI= 3.1   Intake/Output Summary (Last 24 hours) at 09/24/16 1833 Last data filed at 09/24/16 1700  Gross per 24 hour  Intake          4903.74 ml  Output             4120 ml  Net           783.74 ml    HCT= 34  Doing well early postop  Remo Lipps C. Roxan Hockey, MD [facility] [phone]

## 2016-09-24 NOTE — Progress Notes (Signed)
  Echocardiogram Echocardiogram Transesophageal has been performed.  Stuart Henry 09/24/2016, 9:52 AM

## 2016-09-24 NOTE — Anesthesia Preprocedure Evaluation (Addendum)
Anesthesia Evaluation  Patient identified by MRN, date of birth, ID band Patient awake    Reviewed: Allergy & Precautions, NPO status , Patient's Chart, lab work & pertinent test results, Unable to perform ROS - Chart review only  History of Anesthesia Complications Negative for: history of anesthetic complications  Airway Mallampati: II  TM Distance: >3 FB Neck ROM: Full    Dental no notable dental hx. (+) Dental Advisory Given   Pulmonary former smoker,    Pulmonary exam normal        Cardiovascular hypertension, Pt. on medications + CAD  Normal cardiovascular exam  Conclusions: 1. Significant two-vessel coronary artery disease, including 95% proximal LAD stenosis involving ostium of D1 (90%), 90% medium-caliber ramus intermedius, stenosis, and sequential ostial and mid LCx lesions. 2. Mild CAD involving distal RCA. 3. Normal left ventricular filling pressure and left ventricular contraction (LVEF 55-65%).    Neuro/Psych negative neurological ROS  negative psych ROS   GI/Hepatic Neg liver ROS, hiatal hernia, GERD  ,  Endo/Other  diabetes  Renal/GU Renal InsufficiencyRenal disease     Musculoskeletal   Abdominal   Peds  Hematology   Anesthesia Other Findings   Reproductive/Obstetrics                            Anesthesia Physical Anesthesia Plan  ASA: III  Anesthesia Plan: General   Post-op Pain Management:    Induction: Intravenous  Airway Management Planned: Oral ETT  Additional Equipment: Arterial line, 3D TEE and PA Cath  Intra-op Plan:   Post-operative Plan: Post-operative intubation/ventilation  Informed Consent: I have reviewed the patients History and Physical, chart, labs and discussed the procedure including the risks, benefits and alternatives for the proposed anesthesia with the patient or authorized representative who has indicated his/her understanding and  acceptance.   Dental advisory given  Plan Discussed with: CRNA, Anesthesiologist and Surgeon  Anesthesia Plan Comments:        Anesthesia Quick Evaluation

## 2016-09-24 NOTE — Progress Notes (Signed)
Metoprolol held -  Hr 59.

## 2016-09-25 ENCOUNTER — Encounter (HOSPITAL_COMMUNITY): Payer: Self-pay | Admitting: Thoracic Surgery (Cardiothoracic Vascular Surgery)

## 2016-09-25 ENCOUNTER — Inpatient Hospital Stay (HOSPITAL_COMMUNITY): Payer: Medicare Other

## 2016-09-25 LAB — CBC
HEMATOCRIT: 31.7 % — AB (ref 39.0–52.0)
HEMATOCRIT: 31.9 % — AB (ref 39.0–52.0)
Hemoglobin: 10.7 g/dL — ABNORMAL LOW (ref 13.0–17.0)
Hemoglobin: 10.5 g/dL — ABNORMAL LOW (ref 13.0–17.0)
MCH: 29.1 pg (ref 26.0–34.0)
MCH: 29 pg (ref 26.0–34.0)
MCHC: 33.8 g/dL (ref 30.0–36.0)
MCHC: 32.9 g/dL (ref 30.0–36.0)
MCV: 86.1 fL (ref 78.0–100.0)
MCV: 88.1 fL (ref 78.0–100.0)
PLATELETS: 171 10*3/uL (ref 150–400)
PLATELETS: 120 10*3/uL — AB (ref 150–400)
RBC: 3.68 MIL/uL — ABNORMAL LOW (ref 4.22–5.81)
RBC: 3.62 MIL/uL — ABNORMAL LOW (ref 4.22–5.81)
RDW: 13.4 % (ref 11.5–15.5)
RDW: 13.7 % (ref 11.5–15.5)
WBC: 8.3 10*3/uL (ref 4.0–10.5)
WBC: 6.1 10*3/uL (ref 4.0–10.5)

## 2016-09-25 LAB — BASIC METABOLIC PANEL
ANION GAP: 8 (ref 5–15)
BUN: 18 mg/dL (ref 6–20)
CALCIUM: 8.3 mg/dL — AB (ref 8.9–10.3)
CO2: 23 mmol/L (ref 22–32)
CREATININE: 1.14 mg/dL (ref 0.61–1.24)
Chloride: 106 mmol/L (ref 101–111)
GFR calc Af Amer: >60 mL/min (ref >60)
Glucose, Bld: 104 mg/dL — ABNORMAL HIGH (ref 65–99)
Potassium: 4.5 mmol/L (ref 3.5–5.1)
Sodium: 137 mmol/L (ref 135–145)

## 2016-09-25 LAB — GLUCOSE, CAPILLARY
GLUCOSE-CAPILLARY: 88 mg/dL (ref 65–99)
GLUCOSE-CAPILLARY: 97 mg/dL (ref 65–99)
GLUCOSE-CAPILLARY: 105 mg/dL — AB (ref 65–99)
GLUCOSE-CAPILLARY: 112 mg/dL — AB (ref 65–99)
GLUCOSE-CAPILLARY: 105 mg/dL — AB (ref 65–99)
GLUCOSE-CAPILLARY: 154 mg/dL — AB (ref 65–99)
GLUCOSE-CAPILLARY: 202 mg/dL — AB (ref 65–99)
Glucose-Capillary: 101 mg/dL — ABNORMAL HIGH (ref 65–99)
Glucose-Capillary: 108 mg/dL — ABNORMAL HIGH (ref 65–99)
Glucose-Capillary: 119 mg/dL — ABNORMAL HIGH (ref 65–99)
Glucose-Capillary: 141 mg/dL — ABNORMAL HIGH (ref 65–99)
Glucose-Capillary: 169 mg/dL — ABNORMAL HIGH (ref 65–99)
Glucose-Capillary: 63 mg/dL — ABNORMAL LOW (ref 65–99)
Glucose-Capillary: 97 mg/dL (ref 65–99)
Glucose-Capillary: 97 mg/dL (ref 65–99)
Glucose-Capillary: 98 mg/dL (ref 65–99)

## 2016-09-25 LAB — MAGNESIUM
Magnesium: 2.1 mg/dL (ref 1.7–2.4)
Magnesium: 1.9 mg/dL (ref 1.7–2.4)

## 2016-09-25 LAB — POCT I-STAT, CHEM 8
BUN: 17 mg/dL (ref 6–20)
CALCIUM ION: 1.21 mmol/L (ref 1.15–1.40)
Chloride: 98 mmol/L — ABNORMAL LOW (ref 101–111)
Creatinine, Ser: 1.1 mg/dL (ref 0.61–1.24)
Glucose, Bld: 203 mg/dL — ABNORMAL HIGH (ref 65–99)
HCT: 30 % — ABNORMAL LOW (ref 39.0–52.0)
Hemoglobin: 10.2 g/dL — ABNORMAL LOW (ref 13.0–17.0)
Potassium: 4.4 mmol/L (ref 3.5–5.1)
SODIUM: 134 mmol/L — AB (ref 135–145)
TCO2: 24 mmol/L (ref 0–100)

## 2016-09-25 LAB — CREATININE, SERUM
Creatinine, Ser: 1.32 mg/dL — ABNORMAL HIGH (ref 0.61–1.24)
GFR calc non Af Amer: 52 mL/min — ABNORMAL LOW (ref >60)

## 2016-09-25 LAB — BLOOD GAS, ARTERIAL
Acid-base deficit: 0.8 mmol/L (ref 0.0–2.0)
BICARBONATE: 23 mmol/L (ref 20.0–28.0)
DRAWN BY: 449841
FIO2: 21
O2 Saturation: 98.2 %
PATIENT TEMPERATURE: 98.6
PH ART: 7.422 (ref 7.350–7.450)
pCO2 arterial: 36 mmHg (ref 32.0–48.0)
pO2, Arterial: 115 mmHg — ABNORMAL HIGH (ref 83.0–108.0)

## 2016-09-25 MED ORDER — ATORVASTATIN CALCIUM 40 MG PO TABS
40.0000 mg | ORAL_TABLET | Freq: Every day | ORAL | Status: DC
  Administered 2016-09-25 – 2016-09-28 (×4): 40 mg via ORAL
  Filled 2016-09-25 (×4): qty 1

## 2016-09-25 MED ORDER — INSULIN DETEMIR 100 UNIT/ML ~~LOC~~ SOLN
20.0000 [IU] | Freq: Two times a day (BID) | SUBCUTANEOUS | Status: DC
  Administered 2016-09-25: 20 [IU] via SUBCUTANEOUS
  Filled 2016-09-25 (×3): qty 0.2

## 2016-09-25 MED ORDER — ENOXAPARIN SODIUM 40 MG/0.4ML ~~LOC~~ SOLN
40.0000 mg | Freq: Every day | SUBCUTANEOUS | Status: DC
  Administered 2016-09-25: 40 mg via SUBCUTANEOUS
  Filled 2016-09-25: qty 0.4

## 2016-09-25 MED ORDER — FOLIC ACID 1 MG PO TABS
1.0000 mg | ORAL_TABLET | Freq: Every day | ORAL | Status: DC
  Administered 2016-09-25 – 2016-09-29 (×5): 1 mg via ORAL
  Filled 2016-09-25 (×5): qty 1

## 2016-09-25 MED ORDER — GABAPENTIN 300 MG PO CAPS
300.0000 mg | ORAL_CAPSULE | Freq: Every day | ORAL | Status: DC
  Administered 2016-09-25 – 2016-09-28 (×4): 300 mg via ORAL
  Filled 2016-09-25 (×4): qty 1

## 2016-09-25 MED ORDER — INSULIN DETEMIR 100 UNIT/ML ~~LOC~~ SOLN
20.0000 [IU] | Freq: Two times a day (BID) | SUBCUTANEOUS | Status: DC
  Filled 2016-09-25: qty 0.2

## 2016-09-25 MED ORDER — REPAGLINIDE 0.5 MG PO TABS
1.0000 mg | ORAL_TABLET | Freq: Two times a day (BID) | ORAL | Status: DC
  Administered 2016-09-25 – 2016-09-27 (×2): 1 mg via ORAL
  Filled 2016-09-25 (×2): qty 1
  Filled 2016-09-25: qty 2
  Filled 2016-09-25 (×2): qty 1
  Filled 2016-09-25: qty 2
  Filled 2016-09-25: qty 1
  Filled 2016-09-25 (×4): qty 2

## 2016-09-25 MED ORDER — ALLOPURINOL 100 MG PO TABS
100.0000 mg | ORAL_TABLET | Freq: Every day | ORAL | Status: DC
  Administered 2016-09-25 – 2016-09-29 (×4): 100 mg via ORAL
  Filled 2016-09-25 (×4): qty 1

## 2016-09-25 MED ORDER — INSULIN ASPART 100 UNIT/ML ~~LOC~~ SOLN
0.0000 [IU] | SUBCUTANEOUS | Status: DC
  Administered 2016-09-25: 2 [IU] via SUBCUTANEOUS
  Administered 2016-09-25: 8 [IU] via SUBCUTANEOUS

## 2016-09-25 MED ORDER — GLIPIZIDE ER 10 MG PO TB24
10.0000 mg | ORAL_TABLET | Freq: Every day | ORAL | Status: DC
  Administered 2016-09-25 – 2016-09-29 (×5): 10 mg via ORAL
  Filled 2016-09-25 (×5): qty 1

## 2016-09-25 MED FILL — Mannitol IV Soln 20%: INTRAVENOUS | Qty: 500 | Status: AC

## 2016-09-25 MED FILL — Electrolyte-R (PH 7.4) Solution: INTRAVENOUS | Qty: 4000 | Status: AC

## 2016-09-25 MED FILL — Heparin Sodium (Porcine) Inj 1000 Unit/ML: INTRAMUSCULAR | Qty: 10 | Status: AC

## 2016-09-25 MED FILL — Lidocaine HCl IV Inj 20 MG/ML: INTRAVENOUS | Qty: 5 | Status: AC

## 2016-09-25 MED FILL — Sodium Bicarbonate IV Soln 8.4%: INTRAVENOUS | Qty: 50 | Status: AC

## 2016-09-25 MED FILL — Sodium Chloride IV Soln 0.9%: INTRAVENOUS | Qty: 2000 | Status: AC

## 2016-09-25 NOTE — Op Note (Signed)
NAME:  DEMEIR, QUATTRONE NO.:  [id number]  MEDICAL RECORD NO.:  [id number]  LOCATION:                                 FACILITY:  PHYSICIAN:  Revonda Standard. Roxan Hockey, M.D. DATE OF BIRTH:  DATE OF PROCEDURE:  09/25/2016 DATE OF DISCHARGE:                              OPERATIVE REPORT   PREOPERATIVE DIAGNOSIS:  Severe three-vessel coronary disease with progressive angina.  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  PROCEDURE:  Median sternotomy, extracorporeal circulation, coronary artery bypass grafting x4 (left internal mammary artery to left anterior descending, saphenous vein graft to first diagonal, saphenous vein graft to first and second branches of ramus intermedius), endoscopic vein harvest right thigh.  SURGEON:  Revonda Standard. Roxan Hockey, M.D.  ASSISTANT:  John Giovanni, P.A.-C.  ANESTHESIA:  General.  FINDINGS:  Preserved left ventricular function with no significant valvular pathology by TEE.  Good quality conduits.  The LAD and ramus 2 were intramyocardial.  Good quality targets.  CLINICAL NOTE:  Mr. Yoho is a 76 year old man with multiple cardiac risk factors who had recent onset of angina.  At cardiac catheterization he had severe two-vessel coronary disease involving the LAD and a large ramus intermedius branch.  There was a 50% stenosis in a small atrioventricular groove circumflex branch.  There was no significant right coronary disease.  The patient was advised to undergo coronary artery bypass grafting.  The indications, risks, benefits, and alternatives were discussed in detail with the patient.  He understood and accepted the risks and agreed to proceed.  OPERATIVE NOTE:  Mr. Shiers was brought to the preoperative holding area on September 24, 2016.  Anesthesia placed a Swan-Ganz catheter and an arterial blood pressure monitoring line.  He was taken to the operating room, anesthetized and  intubated.  A Foley catheter was placed. Intravenous antibiotics were administered.  Transesophageal echocardiography was performed.  It showed preserved left ventricular function with no significant valvular pathology.  The chest, abdomen, and legs were prepped and draped in the usual sterile fashion.  A median sternotomy was performed.  The left internal mammary artery was harvested using standard technique.  Simultaneously, an incision was made in the medial aspect of the right leg at the level of the knee. The greater saphenous vein was harvested endoscopically from the right thigh.  Both the saphenous vein and mammary artery were good quality vessels.  2000 units of heparin was administered during the vessel harvest.  The remainder of full heparin dose was given prior to opening the pericardium.  After harvesting the conduits, the pericardium was opened.  The ascending aorta was inspected.  There was no evidence of atherosclerotic disease.  The aorta was cannulated via concentric 2-0 Ethibond pledgeted pursestring sutures.  A dual-stage venous cannula was placed via a pursestring suture in the right atrial appendage.  Cardiopulmonary bypass was initiated.  Flows were maintained per protocol.  The patient was cooled to 32 degrees Celsius.  The coronary arteries were inspected and anastomotic sites were chosen.  There was no graftable component of the AV circumflex vessel.  The conduits were inspected and cut to length.  A foam pad was placed in  the pericardium to insulate the heart. A temperature probe was placed in myocardial septum and a cardioplegia cannula was placed in the ascending aorta.  The aorta was crossclamped.  The left ventricle was emptied via the aortic root vent.  Cardiac arrest was achieved with a combination of cold antegrade blood cardioplegia and topical iced saline.  1 L of cardioplegia was administered.  There was a rapid diastolic arrest and subsequently  cooling to 10 degrees Celsius.  A reversed saphenous vein graft was placed sequentially to the first and second branches of the ramus intermedius.  These vessels ran parallel and in relatively close proximity.  The second branch was intramyocardial. A side-to-side anastomosis was performed to the first branch and an end-to- side to the second.  Both were done with running 7-0 Prolene sutures. All anastomoses were probed proximally and distally before tying the suture to ensure patency.  Cardioplegia was administered down the graft. There were good flow and good hemostasis.  Next, a reversed saphenous vein graft was placed end-to-side to the first diagonal branch to the LAD.  This vessel was smaller, but was still good quality.  Did accept a 1.5 mm probe distally.  It was heavily diseased proximally.  The vein was good quality.  An end-to-side anastomosis was performed with a running 7-0 Prolene suture.  Again, there were good flow and good hemostasis with cardioplegia administration.  The left internal mammary artery was brought through a window in the pericardium.  The distal end was beveled, it was anastomosed end-to-side to the distal LAD.  Both the mammary and LAD were good quality vessels. The end-to-side anastomosis was performed with a running 8-0 Prolene suture.  At completion of anastomosis, the bulldog clamp was removed and rapid septal rewarming was noted.  The bulldog clamp was replaced.  The mammary pedicle was tacked to the epicardial surface of the heart with 6- 0 Prolene sutures.  Additional cardioplegia was administered.  The vein grafts were cut to length.  The cardioplegia cannula was removed from the ascending aorta. The proximal vein graft anastomoses were performed to 4.5 mm punch aortotomies with running 6-0 Prolene sutures.  At the completion of the final proximal anastomosis, the patient was placed in Trendelenburg position.  Lidocaine was administered.  The  bulldog clamp was again removed from the left mammary artery.  The aortic root was de-aired and the aortic crossclamp was removed.  Total crossclamp time was 68 minutes.  The patient spontaneously resumed sinus rhythm and did not require defibrillation.  A test dose of protamine was administered and was well tolerated.  The atrial and aortic cannulae were removed.  The remainder of the protamine was administered without incident.  Post bypass transesophageal echocardiography was unchanged from the prebypass study.  The chest was copiously irrigated with warm saline.  Hemostasis was achieved.  The pericardium was reapproximated over the ascending aorta and base of the heart with interrupted 3-0 silk sutures.  It came together easily without tension or kinking of the underlying grafts.  Left pleural and mediastinal chest tubes were placed through separate subcostal incisions.  The sternum was closed with a combination of single and double heavy gauge stainless steel wires.  The pectoralis fascia, subcutaneous tissue, and skin were closed in standard fashion.  All sponge, needle, and instrument counts were correct at the end of the procedure.  The patient was taken from the operating room to the Surgical Intensive Care Unit, intubated and in good condition.  Revonda Standard Roxan Hockey, M.D.     SCH/MEDQ  D:  09/25/2016  T:  09/25/2016  Job:  [id number]

## 2016-09-25 NOTE — Care Management Note (Signed)
Case Management Note  Patient Details  Name: JARIEN FRIESZ MRN: [id number] Date of Birth: 1941/07/08  Subjective/Objective:   S/p CABG                 Action/Plan:   Expected Discharge Date:                  Expected Discharge Plan:     In-House Referral:     Discharge planning Services  CM Consult  Post Acute Care Choice:    Choice offered to:     DME Arranged:    DME Agency:     HH Arranged:    [place] Agency:     Status of Service:  In process, will continue to follow  If discussed at Long Length of Stay Meetings, dates discussed:    Additional Comments:  Maryclare Labrador, RN 09/25/2016, 4:39 PM

## 2016-09-25 NOTE — Progress Notes (Signed)
1 Day Post-Op Procedure(s) (LRB): CORONARY ARTERY BYPASS GRAFTING (CABG) x four, using left internal mammary artery and right leg greater saphenous vein harvested endoscopically (N/A) TRANSESOPHAGEAL ECHOCARDIOGRAM (TEE) (N/A) Subjective: C/o pain with deep breath  Objective: Vital signs in last 24 hours: Temp:  [95.5 F (35.3 C)-100.6 F (38.1 C)] 99.1 F (37.3 C) (02/22 0800) Pulse Rate:  [60-122] 91 (02/22 0800) Cardiac Rhythm: Atrial paced (02/22 0800) Resp:  [12-27] 12 (02/22 0800) BP: (72-114)/(54-80) 99/69 (02/22 0800) SpO2:  [94 %-100 %] 98 % (02/22 0800) Arterial Line BP: (74-126)/(46-82) 110/82 (02/22 0800) FiO2 (%):  [40 %-50 %] 40 % (02/21 1615) Weight:  [221 lb 12.5 oz (100.6 kg)] 221 lb 12.5 oz (100.6 kg) (02/22 0500)  Hemodynamic parameters for last 24 hours: PAP: (22-46)/(9-29) 43/29 CO:  [4.1 L/min-8.9 L/min] 5.8 L/min CI:  [1.8 L/min/m2-3.9 L/min/m2] 2.6 L/min/m2  Intake/Output from previous day: 02/21 0701 - 02/22 0700 In: 6397.4 [I.V.:3702.4; Blood:515; NG/GT:30; IV [id number] Out: [id number] [Urine:3860; Emesis/NG output:30; Blood:1725; Chest Tube:440] Intake/Output this shift: Total I/O In: 68.8 [I.V.:68.8] Out: 150 [Urine:150]  General appearance: alert, cooperative and no distress Neurologic: intact Heart: regular rate and rhythm Lungs: diminished breath sounds bibasilar Abdomen: normal findings: soft, non-tender  Lab Results:  Recent Labs  09/24/16 1935 09/25/16 0338  WBC 7.8 8.3  HGB 11.0* 10.7*  HCT 32.3* 31.7*  PLT 156 171   BMET:  Recent Labs  09/23/16 1156  09/24/16 1928 09/24/16 1935 09/25/16 0338  NA 133*  < > 139  --  137  K 4.3  < > 4.4  --  4.5  CL 98*  < > 103  --  106  CO2 24  --   --   --  23  GLUCOSE 236*  < > 120*  --  104*  BUN 28*  < > 23*  --  18  CREATININE 1.31*  < > 1.10 1.11 1.14  CALCIUM 10.3  --   --   --  8.3*  < > = values in this interval not displayed.  PT/INR:  Recent Labs  09/24/16 1402   LABPROT 17.0*  INR 1.38   ABG    Component Value Date/Time   PHART 7.345 (L) 09/24/2016 1902   HCO3 22.0 09/24/2016 1902   TCO2 22 09/24/2016 1928   ACIDBASEDEF 3.0 (H) 09/24/2016 1902   O2SAT 96.0 09/24/2016 1902   CBG (last 3)   Recent Labs  09/25/16 0320 09/25/16 0424 09/25/16 0624  GLUCAP 105* 112* 97    Assessment/Plan: S/P Procedure(s) (LRB): CORONARY ARTERY BYPASS GRAFTING (CABG) x four, using left internal mammary artery and right leg greater saphenous vein harvested endoscopically (N/A) TRANSESOPHAGEAL ECHOCARDIOGRAM (TEE) (N/A) -  CV- s/p CABG x 4   Good hemodynamics- dc swan and A line  ASA, beta blocker  Statin- has had issues with CBG control with statins in past- will monitor  RESP_ IS for basilar atelectasis  RENAL- Good UO , creatinine and lytes OK  ENDO- CBG well controlled with insulin drip   Convert to levemir + SSI, restart glucotrol and actos  Will wait a day before resuming metformin and Jardiance  Anemia secondary to ABL- mild, follow  DC chest tubes  Mobilize  SCD + enoxaparin for DVT prophylaxis     LOS: 1 day    Melrose Nakayama 09/25/2016

## 2016-09-25 NOTE — Progress Notes (Signed)
T[facility] BRIEF SICU PROGRESS NOTE  1 Day Post-Op  S/P Procedure(s) (LRB): CORONARY ARTERY BYPASS GRAFTING (CABG) x four, using left internal mammary artery and right leg greater saphenous vein harvested endoscopically (N/A) TRANSESOPHAGEAL ECHOCARDIOGRAM (TEE) (N/A)   Stable day AAI paced w/ stable BP Breathing comfortably w/ O2 sats 100%  UOP adequate Labs okay  Plan: Continue routine care  Rexene Alberts, MD 09/25/2016 6:06 PM

## 2016-09-26 ENCOUNTER — Encounter (HOSPITAL_COMMUNITY): Payer: Self-pay | Admitting: General Practice

## 2016-09-26 ENCOUNTER — Inpatient Hospital Stay (HOSPITAL_COMMUNITY): Payer: Medicare Other

## 2016-09-26 LAB — CBC
HEMATOCRIT: 28.4 % — AB (ref 39.0–52.0)
HEMOGLOBIN: 9.5 g/dL — AB (ref 13.0–17.0)
MCH: 29.2 pg (ref 26.0–34.0)
MCHC: 33.5 g/dL (ref 30.0–36.0)
MCV: 87.4 fL (ref 78.0–100.0)
Platelets: 106 10*3/uL — ABNORMAL LOW (ref 150–400)
RBC: 3.25 MIL/uL — ABNORMAL LOW (ref 4.22–5.81)
RDW: 13.5 % (ref 11.5–15.5)
WBC: 6.1 10*3/uL (ref 4.0–10.5)

## 2016-09-26 LAB — GLUCOSE, CAPILLARY
GLUCOSE-CAPILLARY: 91 mg/dL (ref 65–99)
GLUCOSE-CAPILLARY: 118 mg/dL — AB (ref 65–99)
GLUCOSE-CAPILLARY: 174 mg/dL — AB (ref 65–99)
GLUCOSE-CAPILLARY: 131 mg/dL — AB (ref 65–99)
GLUCOSE-CAPILLARY: 108 mg/dL — AB (ref 65–99)

## 2016-09-26 LAB — BASIC METABOLIC PANEL
ANION GAP: 8 (ref 5–15)
BUN: 14 mg/dL (ref 6–20)
CHLORIDE: 99 mmol/L — AB (ref 101–111)
CO2: 25 mmol/L (ref 22–32)
Calcium: 8.5 mg/dL — ABNORMAL LOW (ref 8.9–10.3)
Creatinine, Ser: 1.04 mg/dL (ref 0.61–1.24)
GFR calc Af Amer: >60 mL/min (ref >60)
GLUCOSE: 101 mg/dL — AB (ref 65–99)
POTASSIUM: 4 mmol/L (ref 3.5–5.1)
Sodium: 132 mmol/L — ABNORMAL LOW (ref 135–145)

## 2016-09-26 MED ORDER — MOVING RIGHT ALONG BOOK
Freq: Once | Status: AC
  Administered 2016-09-26: 1
  Filled 2016-09-26: qty 1

## 2016-09-26 MED ORDER — SODIUM CHLORIDE 0.9% FLUSH: 3.0000 mL | INTRAVENOUS | Status: DC | PRN

## 2016-09-26 MED ORDER — SODIUM CHLORIDE 0.9% FLUSH
3.0000 mL | Freq: Two times a day (BID) | INTRAVENOUS | Status: DC
  Administered 2016-09-26 – 2016-09-28 (×3): 3 mL via INTRAVENOUS

## 2016-09-26 MED ORDER — METFORMIN HCL 500 MG PO TABS
1000.0000 mg | ORAL_TABLET | Freq: Two times a day (BID) | ORAL | Status: DC
  Administered 2016-09-26 – 2016-09-29 (×6): 1000 mg via ORAL
  Filled 2016-09-26 (×7): qty 2

## 2016-09-26 MED ORDER — INSULIN ASPART 100 UNIT/ML ~~LOC~~ SOLN
0.0000 [IU] | Freq: Three times a day (TID) | SUBCUTANEOUS | Status: DC
  Administered 2016-09-26: 4 [IU] via SUBCUTANEOUS
  Administered 2016-09-26 – 2016-09-29 (×3): 2 [IU] via SUBCUTANEOUS

## 2016-09-26 MED ORDER — SODIUM CHLORIDE 0.9 % IV SOLN: 250.0000 mL | INTRAVENOUS | Status: DC | PRN

## 2016-09-26 MED ORDER — INSULIN STARTER KIT- PEN NEEDLES (ENGLISH)
1.0000 | Freq: Once | Status: DC
  Filled 2016-09-26: qty 1

## 2016-09-26 MED ORDER — LIVING WELL WITH DIABETES BOOK
Freq: Once | Status: DC
  Filled 2016-09-26: qty 1

## 2016-09-26 MED ORDER — INSULIN DETEMIR 100 UNIT/ML ~~LOC~~ SOLN
20.0000 [IU] | Freq: Every day | SUBCUTANEOUS | Status: DC
  Administered 2016-09-26 – 2016-09-29 (×4): 20 [IU] via SUBCUTANEOUS
  Filled 2016-09-26 (×4): qty 0.2

## 2016-09-26 NOTE — Progress Notes (Signed)
Patient arrived to 2W room 32.  Telemetry monitor applied and CCMD notified.  Patient oriented to unit and room to include call light and phone.  Will continue to monitor.

## 2016-09-26 NOTE — Progress Notes (Signed)
CARDIAC REHAB PHASE I   PRE:  Rate/Rhythm: 71 SR  BP:  Sitting: 122/71        SaO2: 99 RA  MODE:  Ambulation: 200 ft   POST:  Rate/Rhythm: 82  BP:  Sitting: 118/78         SaO2: 100 RA  Pt has some difficulty getting in and out of the bed due to the fact that he is very tall. Pt ambulated 200 ft on RA, rolling walker, mostly steady gait, tolerated fairly well. Pt c/o some DOE, fatigue with distance, declined rest stop. Pt requested to return to bed after walk, states he has difficulty sitting up in the recliner chair with his feet elevated due to his height, call bell within reach. Encouraged IS, additional ambulation x1 today. Will follow.   [id number] Lenna Sciara, RN, BSN 09/26/2016 3:03 PM

## 2016-09-26 NOTE — Progress Notes (Signed)
PT Cancellation Note  Patient Details Name: Stuart Henry MRN: [id number] DOB: Mar 03, 1941   Cancelled Treatment:    Reason Eval/Treat Not Completed: Other (comment). Pt just returned from amb.   [place] 09/26/2016, 3:16 PM [place]

## 2016-09-26 NOTE — Discharge Summary (Signed)
Physician Discharge Summary  Patient ID: Stuart Henry MRN: [id number] DOB/AGE: [birth year] 76 y.o.  Admit date: 09/24/2016 Discharge date: 09/29/2016  Admission Diagnoses: 1. Angina 2. Multivessel CAD  Ative Diagnoses:   Patient Active Problem List   Diagnosis Date Noted  . Allergic rhinitis 02/22/2015  . BPH (benign prostatic hyperplasia) 02/22/2015  . Dermatitis 02/22/2015  . H/O adenomatous polyp of colon 02/22/2015  . Skin lesion 02/22/2015  . Type II diabetes mellitus with renal manifestations, uncontrolled ([place]) 01/25/2009  . Polyneuropathy ([place]) 07/18/2008  . Hyperlipidemia 12/15/2006  . Uncontrolled diabetes mellitus with diabetic neuropathy ([place]) 08/04/2004  . Essential (primary) hypertension 08/04/2004  . Gouty arthropathy  Tobacco abuse PAF ABL anemia Mild thrombocytopenia  Discharged Condition: good  HPI:  76 yo man presents with a cc/o exertional CP.  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 About 2 weeks ago while walking up an incline he noted sudden onset of a soreness in his chest. This was associated with shortness of breath, but no nausea, vomiting, or diaphoresis. It resolved with rest. That happened to him 3 days in a row. He went to eye Dr. Caryn Section and was referred to Dr. Saunders Revel.  Dr. Saunders Revel performed cardiac catheterization on 09/16/2016. It revealed severe two-vessel coronary disease with a 95% ostial LAD lesion involving the takeoff of a high diagonal branch. There also was a 90% stenosis in the ramus intermedius involving both portions of the bifurcation. There was a 50% stenosis in the small AV groove circumflex. There was no significant disease in the right coronary. An echocardiogram revealed no significant valvular pathology. There was an ejection fraction of 45-50%.  He has not had any  rest or nocturnal pain. He has had some dizzy spells recently. He says they're in frequent and usually when he stands up quickly. His blood sugars have been more difficult to manage recently. He does have diabetic neuropathy in his feet. He remains physically active although he's had to cut back due to his recent onset of chest pain.  Hospital Course:   Stuart Henry underwent a coronary artery bypass grafting x 4 with Dr. Roxan Hockey on 09/24/2016. He tolerated the procedure well and was transferred to the ICU. He was extubated in a timely manner. He continues to progress. We discontinued the chest tubes. He was initially paced. He was  restarted his metformin for better blood glucose control. His pre op HGA1C is 9.3. HE will need close medical follow up with his medical doctor after discharge. He will be discharged with an Insulin pen in addition to his pre op oral diabetic medications. His renal function remained stable. He had ABL anemia. He did not require a post op transfusion. His last H and H was stable at 10.4 and 31.4. He also had mild thrombocytopenia. His last platelet count was up to 123,000. He was transfer from the ICU to 2000 for further convalescence on 02/23. He went into a fib with a controlled ventricular rate on 02/24. He was on Lopressor 12.5 mg bid. As discussed with Dr. Radford Pax, he was put on Amiodarone 200 mg daily and Eliquis He remained in intermit afib/PAC's . He is ambulating on room air. He is tolerating a diet and has had a bowel movement.  His wounds are clean, dry, and continuing to heal. He is felt surgically stable for discharge todayafter  being seen by Dr Roxan Hockey  Consults: cardiology  Significant Diagnostic Studies:  CLINICAL DATA:  Sore chest.  CABG .  EXAM: PORTABLE CHEST 1 VIEW  COMPARISON:  09/25/2016 .  FINDINGS: Interim removal Swan-Ganz catheter, mediastinal drainage catheter, left chest tube. Right IJ sheath in stable position. Prior  CABG. Cardiomegaly. No pulmonary venous congestion. Left lower lobe atelectasis and consolidation. Small left pleural effusion. No pneumothorax. Prior cervical spine fusion.  IMPRESSION: 1. Interim removal Swan-Ganz catheter, mediastinal drainage catheter, left chest tube.  2.  Prior CABG.  Cardiomegaly.  No pulmonary venous congestion.  3. Left lower lobe atelectasis/infiltrate with small left pleural effusion .   Electronically Signed   By: Marcello Moores  Register   On: 09/26/2016 07:22   Treatments:  NAME:  Stuart Henry, Stuart Henry              ACCOUNT NO.:  [id number]  MEDICAL RECORD NO.:  [id number]  LOCATION:                                 FACILITY:  PHYSICIAN:  Revonda Standard. Roxan Hockey, M.D. DATE OF BIRTH:  DATE OF PROCEDURE:  09/25/2016 DATE OF DISCHARGE:                              OPERATIVE REPORT   PREOPERATIVE DIAGNOSIS:  Severe three-vessel coronary disease with progressive angina.  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  PROCEDURE:  Median sternotomy, extracorporeal circulation, coronary artery bypass grafting x4 (left internal mammary artery to left anterior descending, saphenous vein graft to first diagonal, saphenous vein graft to first and second branches of ramus intermedius), endoscopic vein harvest right thigh.  SURGEON:  Revonda Standard. Roxan Hockey, M.D.  ASSISTANT:  John Giovanni, P.A.-C.  ANESTHESIA:  General.  FINDINGS:  Preserved left ventricular function with no significant valvular pathology by TEE.  Good quality conduits.  The LAD and ramus 2 were intramyocardial.  Good quality targets.  Discharge Exam: Blood pressure 127/71, pulse 69, temperature 98.3 F (36.8 C), temperature source Oral, resp. rate 18, height '6\' 4"'  (1.93 m), weight 216 lb 6.4 oz (98.2 kg), SpO2 97 %.    General appearance: alert, cooperative and no distress Heart: regular rate and rhythm Lungs: clear to  auscultation bilaterally Abdomen: benign Extremities: no edema Wound: incis healing well   Disposition: 01-Home or Self Care  Discharge Instructions    Amb Referral to Cardiac Rehabilitation    Complete by:  As directed    Diagnosis:  CABG   CABG X ___:  4   Discharge patient    Complete by:  As directed    Discharge disposition:  01-Home or Self Care   Discharge patient date:  09/29/2016     Allergies as of 09/29/2016      Reactions   No Known Allergies       Medication List    STOP taking these medications   benazepril-hydrochlorthiazide 20-12.5 MG tablet Commonly known as:  LOTENSIN HCT   ibuprofen 200 MG tablet Commonly known as:  ADVIL,MOTRIN   nitroGLYCERIN 0.4 MG SL tablet Commonly known as:  NITROSTAT   repaglinide 1 MG tablet Commonly known as:  PRANDIN     TAKE these medications   ACCU-CHEK AVIVA PLUS test strip Generic drug:  glucose blood Check blood sugar once  daily   ACCU-CHEK AVIVA PLUS w/Device Kit Use  to check blood sugar daily as directed   allopurinol 100 MG tablet Commonly known as:  ZYLOPRIM TAKE 1 TABLET DAILY   amiodarone 200 MG tablet Commonly known as:  PACERONE Take 1 tablet (200 mg total) by mouth 2 (two) times daily.   apixaban 2.5 MG Tabs tablet Commonly known as:  ELIQUIS Take 1 tablet (2.5 mg total) by mouth 2 (two) times daily.   aspirin 81 MG tablet Take 81 mg by mouth at bedtime.   atorvastatin 40 MG tablet Commonly known as:  LIPITOR Take 1 tablet (40 mg total) by mouth daily. What changed:  when to take this   empagliflozin 25 MG Tabs tablet Commonly known as:  JARDIANCE Take 25 mg by mouth daily.   fluticasone 50 MCG/ACT nasal spray Commonly known as:  FLONASE Place 2 sprays into both nostrils daily. What changed:  when to take this  reasons to take this   folic acid 100 MCG tablet Commonly known as:  FOLVITE Take 800 mcg by mouth daily.   gabapentin 300 MG capsule Commonly known as:   NEURONTIN TAKE ONE CAPSULE BY MOUTH AT BEDTIME FOR NEUROPATHY   glipiZIDE 10 MG 24 hr tablet Commonly known as:  GLUCOTROL XL Take 1 tablet (10 mg total) by mouth daily with breakfast.   insulin detemir 100 UNIT/ML injection Commonly known as:  LEVEMIR Inject 0.1 mLs (10 Units total) into the skin daily. Please dispense flex pen equivalent Start taking on:  09/30/2016   metFORMIN 1000 MG tablet Commonly known as:  GLUCOPHAGE Take 1,000 mg by mouth 2 (two) times daily with a meal.   metoprolol tartrate 25 MG tablet Commonly known as:  LOPRESSOR Take 0.5 tablets (12.5 mg total) by mouth 2 (two) times daily.   oxyCODONE 5 MG immediate release tablet Commonly known as:  Oxy IR/ROXICODONE Take 1-2 tablets (5-10 mg total) by mouth every 6 (six) hours as needed for severe pain.   pioglitazone 45 MG tablet Commonly known as:  ACTOS Take 1 tablet (45 mg total) by mouth daily.   Vitamin B-12 5000 MCG Tbdp Take 5,000 mcg by mouth daily.      Follow-up Information    [name], MD. Call in 1 day(s).   Specialty:  Family Medicine Contact information: [address] [place] [address] [phone]        Nelva Bush, MD Follow up.   Specialty:  Cardiology Why:  Your appointment is on 3/7 @ 9am [facility]). Please bring your medication list. Contact information: [place] [address] [place] [address] [phone]        [place] Follow up.   Specialty:  Cardiology Why:  Call to have PT and INR drawn (as is on Coumadin for a fib) 48 hours after discharge from hospital Contact information: [address], [place]         The patient has been discharged on:   1.Beta Blocker:  Yes [ x  ]                              No   [   ]                              If No, reason:  2.Ace Inhibitor/ARB: Yes [   ]  No  [  x  ]                                      If No, reason: Labile BP  3.Statin:   Yes [ x  ]                  No  [   ]                  If No, reason:  4.Ecasa:  Yes  [ x  ]                  No   [   ]                  If No, reason:    Signed: GOLD,WAYNE E PA-C 09/29/2016, 3:07 PM

## 2016-09-26 NOTE — Progress Notes (Addendum)
[address]       [place] [address]             [phone]      2 Days Post-Op Procedure(s) (LRB): CORONARY ARTERY BYPASS GRAFTING (CABG) x four, using left internal mammary artery and right leg greater saphenous vein harvested endoscopically (N/A) TRANSESOPHAGEAL ECHOCARDIOGRAM (TEE) (N/A)   Subjective:  Doing okay.  He thinks the walk tired him out.  He wants his foley removed.  Objective: Vital signs in last 24 hours: Temp:  [98.4 F (36.9 C)-99.7 F (37.6 C)] 99.2 F (37.3 C) (02/23 0802) Pulse Rate:  [81-98] 88 (02/23 0815) Cardiac Rhythm: Atrial paced (02/23 0800) Resp:  [11-21] 15 (02/23 0815) BP: (92-132)/(64-76) 92/76 (02/23 0800) SpO2:  [67 %-99 %] 98 % (02/23 0815) Arterial Line BP: (91-132)/(50-69) 115/50 (02/22 1600) Weight:  [222 lb 3.6 oz (100.8 kg)] 222 lb 3.6 oz (100.8 kg) (02/23 0500)  Intake/Output from previous day: 02/22 0701 - 02/23 0700 In: 536.3 [I.V.:436.3; IV Piggyback:100] Out: 3455 [Urine:3425; Chest Tube:30] Intake/Output this shift: Total I/O In: 20 [I.V.:20] Out: 100 [Urine:100]  General appearance: alert, cooperative and no distress Heart: regular rate and rhythm Lungs: clear to auscultation on the Right, diminished Left Base Abdomen: soft, non-tender; bowel sounds normal; no masses,  no organomegaly Extremities: edema trace Wound: clean and dry  Lab Results:  Recent Labs  09/25/16 1620 09/25/16 1741 09/26/16 0356  WBC 6.1  --  6.1  HGB 10.5* 10.2* 9.5*  HCT 31.9* 30.0* 28.4*  PLT 120*  --  106*   BMET:  Recent Labs  09/25/16 0338  09/25/16 1741 09/26/16 0356  NA 137  --  134* 132*  K 4.5  --  4.4 4.0  CL 106  --  98* 99*  CO2 23  --   --  25  GLUCOSE 104*  --  203* 101*  BUN 18  --  17 14  CREATININE 1.14  < > 1.10 1.04  CALCIUM 8.3*  --   --  8.5*  < > = values in this interval not displayed.  PT/INR:  Recent Labs  09/24/16 1402  LABPROT 17.0*  INR 1.38   ABG    Component  Value Date/Time   PHART 7.345 (L) 09/24/2016 1902   HCO3 22.0 09/24/2016 1902   TCO2 24 09/25/2016 1741   ACIDBASEDEF 3.0 (H) 09/24/2016 1902   O2SAT 96.0 09/24/2016 1902   CBG (last 3)   Recent Labs  09/25/16 2351 09/26/16 0349 09/26/16 0759  GLUCAP 119* 91 118*    Assessment/Plan: S/P Procedure(s) (LRB): CORONARY ARTERY BYPASS GRAFTING (CABG) x four, using left internal mammary artery and right leg greater saphenous vein harvested endoscopically (N/A) TRANSESOPHAGEAL ECHOCARDIOGRAM (TEE) (N/A)  1. CV- NSR, off all drips... BP low in the 90s- Lopressor ordered, however will likely have to hold due to BP 2. Pulm- wean oxygen as tolerated, small left pleural effusion, continue IS 3. Renal- creatinine at 1.04, remove foley, weight is at baseline, no Lasix currently 4. Expected post operative blood loss anemia- mild, Hgb at 9.5, will start mulitvitamin 5. Thrombocytopenia- Plt count down to 106, continue Lovenex for DVT prophylaxis, and repeat count in AM 6. DM- sugars controlled, continue insulin and glipizide 7. Dispo- patient stable, off all drips, BP labile will watch, ready for transfer to stepdown  LOS: 2 days    BARRETT, ERIN 09/26/2016 Patient seen and examined, agree with above His platelet count is down  this AM. He is ambulatory and has SCD hose so will stop enoxaparin Sugars low overnight- decrease levemir to once daily, restart metformin Transfer to [place]. Roxan Hockey, MD [facility] [phone]

## 2016-09-26 NOTE — Progress Notes (Signed)
Inpatient Diabetes Program Recommendations  AACE/ADA: New Consensus Statement on Inpatient Glycemic Control (2015)  Target Ranges:  Prepandial:   less than 140 mg/dL      Peak postprandial:   less than 180 mg/dL (1-2 hours)      Critically ill patients:  140 - 180 mg/dL   Lab Results  Component Value Date   GLUCAP 174 (H) 09/26/2016   HGBA1C 9.3 (H) 09/23/2016    Review of Glycemic Control  Diabetes history: DM2 Outpatient Diabetes medications: Jardiance 25 mg Daily, Glipizide 10 Daily, Metformin 1000 mg BID, Actos 45 Daily Current orders for Inpatient glycemic control: Levemir 20 units daily + Glipizide 10 mg qd + Metformin 1 gm bid + Prandin 1 mg qd  Inpatient Diabetes Program Recommendations:  Spoke with patient & wife @ bedside about A1C 9.3 explained what an A1C is, basic pathophysiology of DM Type 2, basic home care, basic diabetes diet nutrition principles, importance of checking CBGs and maintaining good CBG control to prevent long-term and short-term complications. Reviewed signs and symptoms of hyperglycemia and hypoglycemia and how to treat hypoglycemia at home. Also reviewed blood sugar goals at home.  RNs to provide ongoing basic DM education at bedside with this patient. Have ordered educational booklet, insulin starter kit, and DM videos.  Reviewed insulin pen with patient and wife with patient successful return demonstration. Spoke with RN regarding starting insulin injection teaching and review insulin pen teaching with patient. Will follow.  Thank you, Nani Gasser. Kya Mayfield, RN, MSN, CDE Inpatient Glycemic Control Team Team Pager [phone] (8am-5pm) 09/26/2016 1:31 PM

## 2016-09-27 ENCOUNTER — Inpatient Hospital Stay (HOSPITAL_COMMUNITY): Payer: Medicare Other

## 2016-09-27 LAB — BASIC METABOLIC PANEL
Anion gap: 11 (ref 5–15)
BUN: 17 mg/dL (ref 6–20)
CHLORIDE: 98 mmol/L — AB (ref 101–111)
CO2: 26 mmol/L (ref 22–32)
CREATININE: 1.17 mg/dL (ref 0.61–1.24)
Calcium: 9 mg/dL (ref 8.9–10.3)
GFR calc Af Amer: >60 mL/min (ref >60)
GFR calc non Af Amer: >60 mL/min (ref >60)
Glucose, Bld: 113 mg/dL — ABNORMAL HIGH (ref 65–99)
Potassium: 4.3 mmol/L (ref 3.5–5.1)
Sodium: 135 mmol/L (ref 135–145)

## 2016-09-27 LAB — CBC
HEMATOCRIT: 31.4 % — AB (ref 39.0–52.0)
HEMOGLOBIN: 10.4 g/dL — AB (ref 13.0–17.0)
MCH: 29.1 pg (ref 26.0–34.0)
MCHC: 33.1 g/dL (ref 30.0–36.0)
MCV: 88 fL (ref 78.0–100.0)
Platelets: 123 10*3/uL — ABNORMAL LOW (ref 150–400)
RBC: 3.57 MIL/uL — ABNORMAL LOW (ref 4.22–5.81)
RDW: 13.5 % (ref 11.5–15.5)
WBC: 6.8 10*3/uL (ref 4.0–10.5)

## 2016-09-27 LAB — GLUCOSE, CAPILLARY
Glucose-Capillary: 131 mg/dL — ABNORMAL HIGH (ref 65–99)
Glucose-Capillary: 118 mg/dL — ABNORMAL HIGH (ref 65–99)
Glucose-Capillary: 118 mg/dL — ABNORMAL HIGH (ref 65–99)

## 2016-09-27 MED ORDER — AMIODARONE HCL 200 MG PO TABS
200.0000 mg | ORAL_TABLET | Freq: Every day | ORAL | Status: DC
  Administered 2016-09-27 – 2016-09-28 (×2): 200 mg via ORAL
  Filled 2016-09-27: qty 1

## 2016-09-27 MED ORDER — ASPIRIN EC 81 MG PO TBEC
81.0000 mg | DELAYED_RELEASE_TABLET | Freq: Every day | ORAL | Status: DC
  Administered 2016-09-28 – 2016-09-29 (×2): 81 mg via ORAL
  Filled 2016-09-27 (×2): qty 1

## 2016-09-27 MED ORDER — WARFARIN SODIUM 2.5 MG PO TABS
2.5000 mg | ORAL_TABLET | Freq: Every day | ORAL | Status: DC
  Administered 2016-09-27 – 2016-09-28 (×2): 2.5 mg via ORAL
  Filled 2016-09-27 (×2): qty 1

## 2016-09-27 MED ORDER — ASPIRIN EC 81 MG PO TBEC: 81.0000 mg | DELAYED_RELEASE_TABLET | Freq: Every day | ORAL | Status: DC

## 2016-09-27 MED ORDER — WARFARIN - PHYSICIAN DOSING INPATIENT: Freq: Every day | Status: DC

## 2016-09-27 NOTE — Evaluation (Signed)
Physical Therapy Evaluation Patient Details Name: Stuart Henry MRN: [id number] DOB: 01-Jul-1941 Today's Date: 09/27/2016   History of Present Illness  Pt adm for CABG x 3. PMH - DM, neuropathy, gout, HTN  Clinical Impression  Pt admitted with above diagnosis and presents to PT with functional limitations due to deficits listed below (See PT problem list). Pt needs skilled PT to maximize independence and safety to allow discharge to home with wife. Pt fatigues quickly. Pt with HR 87 at rest and 105 with amb. Pt c/o heart pounding earlier while standing to shave. Expect pt will make good progress.     Follow Up Recommendations No PT follow up;Supervision - Intermittent    Equipment Recommendations  None recommended by PT    Recommendations for Other Services       Precautions / Restrictions Precautions Precautions: Sternal      Mobility  Bed Mobility Overal bed mobility: Needs Assistance Bed Mobility: Supine to Sit;Sit to Sidelying     Supine to sit: Min assist;HOB elevated   Sit to sidelying: Min assist;HOB elevated General bed mobility comments: Assist to elevate trunk into sitting. Assist to bring legs back into bed returning to sidelying  Transfers Overall transfer level: Needs assistance Equipment used: Rolling walker (2 wheeled) Transfers: Sit to/from Stand Sit to Stand: Min guard         General transfer comment: Had pt place hands on knees for stability and to follow sternal precautions  Ambulation/Gait Ambulation/Gait assistance: Min guard Ambulation Distance (Feet): 100 Feet Assistive device: Rolling walker (2 wheeled) Gait Pattern/deviations: Step-through pattern;Decreased stride length;Trunk flexed Gait velocity: decr Gait velocity interpretation: Below normal speed for age/gender General Gait Details: Assist for balance and safety.   Stairs            Wheelchair Mobility    Modified Rankin (Stroke Patients Only)       Balance  Overall balance assessment: Needs assistance Sitting-balance support: No upper extremity supported;Feet supported Sitting balance-Leahy Scale: Good     Standing balance support: No upper extremity supported;During functional activity Standing balance-Leahy Scale: Fair                               Pertinent Vitals/Pain      Home Living Family/patient expects to be discharged to:: Private residence Living Arrangements: Spouse/significant other Available Help at Discharge: Family Type of Home: House Home Access: Stairs to enter Entrance Stairs-Rails: Right Entrance Stairs-Number of Steps: 6 Home Layout: Two level;Able to live on main level with bedroom/bathroom Home Equipment: Gilford Rile - 2 wheels      Prior Function Level of Independence: Independent         Comments: Very active     Hand Dominance        Extremity/Trunk Assessment   Upper Extremity Assessment Upper Extremity Assessment: Overall WFL for tasks assessed    Lower Extremity Assessment Lower Extremity Assessment: Generalized weakness       Communication   Communication: No difficulties  Cognition Arousal/Alertness: Awake/alert Behavior During Therapy: WFL for tasks assessed/performed Overall Cognitive Status: Within Functional Limits for tasks assessed                      General Comments      Exercises     Assessment/Plan    PT Assessment Patient needs continued PT services  PT Problem List Decreased strength;Decreased activity tolerance;Decreased balance;Decreased mobility;Decreased knowledge of use of  DME;Decreased knowledge of precautions       PT Treatment Interventions DME instruction;Gait training;Stair training;Functional mobility training;Therapeutic activities;Therapeutic exercise;Balance training;Patient/family education    PT Goals (Current goals can be found in the Care Plan section)  Acute Rehab PT Goals Patient Stated Goal: return home PT Goal  Formulation: With patient Time For Goal Achievement: 10/04/16 Potential to Achieve Goals: Good    Frequency Min 3X/week   Barriers to discharge        Co-evaluation               End of Session   Activity Tolerance: Patient limited by fatigue Patient left: in bed;with call bell/phone within reach Nurse Communication: Mobility status PT Visit Diagnosis: Difficulty in walking, not elsewhere classified (R26.2)         Time: [id number] PT Time Calculation (min) (ACUTE ONLY): 17 min   Charges:   PT Evaluation $PT Eval Moderate Complexity: 1 Procedure     PT G CodesShary Decamp [facility] 10-26-2016, 8:18 AM Suanne Marker PT [phone]

## 2016-09-27 NOTE — Progress Notes (Signed)
CARDIAC REHAB PHASE I   PRE:  Rate/Rhythm: 77  BP:  Sitting: 108/66     SaO2: 96 ra  MODE:  Ambulation: 250 ft   POST:  Rate/Rhythm: 79  BP:  Sitting: 120/69     SaO2: 99 ra  1:37pm-2:22pm  Patient ambulated independently with a walker. Complained of fatigue but otherwise fine. Education completed. Patient is interested in Cardiac Rehab at [facility]. Will place order. Patient in bed with family at bedside.   [place], [place] 09/27/2016 2:21 PM

## 2016-09-27 NOTE — Progress Notes (Addendum)
      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 No specific complaints this am.  Objective: Vital signs in last 24 hours: Temp:  [97.6 F (36.4 C)-99.3 F (37.4 C)] 97.6 F (36.4 C) (02/24 0452) Pulse Rate:  [78-89] 87 (02/24 0452) Cardiac Rhythm: Atrial fibrillation (02/24 0621) Resp:  [15-19] 18 (02/24 0452) BP: (92-125)/(63-82) 108/64 (02/24 0452) SpO2:  [92 %-100 %] 95 % (02/24 0452) Weight:  [219 lb 9.6 oz (99.6 kg)-220 lb 14.4 oz (100.2 kg)] 219 lb 9.6 oz (99.6 kg) (02/24 0452)  Pre op weight 100.6 kg Current Weight  09/27/16 219 lb 9.6 oz (99.6 kg)       Intake/Output from previous day: 02/23 0701 - 02/24 0700 In: 400 [P.O.:360; I.V.:40] Out: 500 [Urine:500]   Physical Exam:  Cardiovascular: IRRR IRRR Pulmonary: Clear to auscultation bilaterally Abdomen: Soft, non tender, bowel sounds present. Extremities: Trace  bilateral lower extremity edema. Wounds: Clean and dry.  No erythema or signs of infection.  Lab Results: CBC: Recent Labs  09/26/16 0356 09/27/16 0329  WBC 6.1 6.8  HGB 9.5* 10.4*  HCT 28.4* 31.4*  PLT 106* 123*   BMET:  Recent Labs  09/26/16 0356 09/27/16 0329  NA 132* 135  K 4.0 4.3  CL 99* 98*  CO2 25 26  GLUCOSE 101* 113*  BUN 14 17  CREATININE 1.04 1.17  CALCIUM 8.5* 9.0    PT/INR:  Lab Results  Component Value Date   INR 1.38 09/24/2016   INR 1.08 09/23/2016   INR 1.1 09/10/2016   ABG:  INR: Will add last result for INR, ABG once components are confirmed Will add last 4 CBG results once components are confirmed  Assessment/Plan:  1. CV - A fib with CVR. On Lopressor 12.5 mg bid. As discussed  with Dr. Radford Pax, will start Amiodarone 200 mg daily and Coumadin. Patient will need 3 months of Coumadin. 2.  Pulmonary - On 2 liters of oxygen via [place]. Continue to wean as able. Encourage incentive spirometer 3.  Acute blood loss anemia - H and H stable at 10.4 and 31.4 4. DM-CBGs 174/131/108. On Glipizide 10 mg daily, Metformin 1000 mg bid, Prandin 1 mg bid, and  Insulin. Pre op HGA1C 9.3. He will need close medical follow up after discharge. 5. Mild thrombocytopenia-platelets up to 123,000 6. Remove EPW in am  ZIMMERMAN,Stuart Henry 09/27/2016,6:36 AM   Found in AFIB this am I have seen and examined Stuart Henry and agree with the above assessment  and plan.  Stuart Isaac MD Beeper [phone] Office [phone] 09/27/2016 2:45 PM

## 2016-09-28 LAB — GLUCOSE, CAPILLARY
GLUCOSE-CAPILLARY: 97 mg/dL (ref 65–99)
GLUCOSE-CAPILLARY: 92 mg/dL (ref 65–99)
Glucose-Capillary: 103 mg/dL — ABNORMAL HIGH (ref 65–99)
Glucose-Capillary: 105 mg/dL — ABNORMAL HIGH (ref 65–99)

## 2016-09-28 LAB — PROTIME-INR
INR: 1.23
Prothrombin Time: 15.6 seconds — ABNORMAL HIGH (ref 11.4–15.2)

## 2016-09-28 MED ORDER — PATIENT'S GUIDE TO USING COUMADIN BOOK
Freq: Once | Status: DC
  Filled 2016-09-28: qty 1

## 2016-09-28 NOTE — Discharge Instructions (Addendum)
What You Need to Know About Warfarin Warfarin is a blood thinner (anticoagulant). Anticoagulants help to prevent the formation of blood clots. They also help to stop the growth of blood clots. Who should use warfarin? Warfarin is prescribed for people who are at risk for developing harmful blood clots, such as people who have: Surgically implanted mechanical heart valves. Irregular heart rhythms (atrial fibrillation). Certain clotting disorders. A history of harmful blood clotting in the past. This includes people who have had: A stroke. Blood clot in the lungs (pulmonary embolism, or PE). Blood clot in the legs (deep vein thrombosis, or DVT). An existing blood clot. How is warfarin taken?   Warfarin is a medicine that you take by mouth (orally). Warfarin tablets come in different strengths. Each tablet strength is a different color, with the amount of warfarin printed on the tablet. If you get a new prescription filled and the color of your tablet is different than usual, tell your pharmacist or health care provider immediately. What blood tests do I need while taking warfarin? The goal of warfarin therapy is to lessen the clotting tendency of blood, but not to prevent clotting completely. Your health care provider will monitor the anticoagulation effect of warfarin closely and will adjust your dose as needed. Warfarin is a medicine that needs to be closely monitored, so it is very important to keep all lab visits and follow-up visits with your health care provider. While taking warfarin, you will need to have blood tests (prothrombin tests, or PT tests) regularly to measure your blood clotting time. This type of test can be done with a finger stick or a blood draw. What does the INR test result mean? The PT test results will be reported as the International Normalized Ratio (INR). The INR tells your health care provider whether your dosage of warfarin needs to be changed. The longer it takes  your blood to clot, the higher the INR. Your health care provider will tell you your target INR range. If your INR is not in your target range, your health care provider may adjust your dosage. If your INR is above your target range, there is a risk of bleeding. Your dosage of warfarin may need to be decreased. If your INR is below your target range, there is a risk of clotting. Your dosage of warfarin may need to be increased. How often is the INR test needed? When you first start warfarin, you will usually have your INR checked every few days. You may need to have INR tests done more than once a week until you are taking the correct dosage of warfarin. After you have reached your target INR, your INR will be tested less often. However, you will need to have your INR checked at least once every 4-6 weeks for the entire time you are taking warfarin. What are the side effects of warfarin? Too much warfarin can cause bleeding (hemorrhage) in any part of the body, such as: Bleeding from the gums. Unexplained bruises. Bruises that get larger. Blood in the urine. Bloody or dark stools. Bleeding in the brain (hemorrhagic stroke). A nosebleed that is not easily stopped. Coughing up blood. Vomiting blood. Warfarin use may also cause: Skin rash or irritations Nausea that does not go away. Severe pain in the back or joints. Painful toes that turn blue or purple (purple toe syndrome). Painful ulcers that do not go away (skin necrosis). What are the signs and symptoms of a blood clot? Too little warfarin can  increase the risk of blood clots in your legs, lungs, or arms. Signs and symptoms of a DVT in your leg or arm may include: Pain or swelling in your leg or arm. Skin that is red or warm to the touch on your arm or leg. Signs and symptoms of a pulmonary embolism may include: Shortness of breath or difficulty breathing. Chest pain. Unexplained fever. What are the signs and symptoms of a  stroke? If you are taking too much or too little warfarin, you can have a stroke. Signs and symptoms of a stroke may include: Weakness or numbness of your face, arm, or leg, especially on one side of your body. Confusion or trouble thinking clearly. Difficulty seeing with one or both eyes. Difficulty walking or moving your arms or legs. Dizziness. Loss of balance or coordination. Trouble speaking, trouble understanding speech, or both (aphasia). Sudden, severe headache with no known cause. Partial or total loss of consciousness. What precautions do I need to take while using warfarin?  Take warfarin exactly as told by your health care provider. Doing this helps you avoid bleeding or blood clots that could result in serious injury, pain, or disability. Take your medicine at the same time every day. If you forget to take your dose of warfarin, take it as soon as you remember that day. If you do not remember on that day, do not take an extra dose the next day. Contact your health care provider if you miss or take an extra dose. Do not change your dosage on your own to make up for missed or extra doses. Wear or carry identification that says that you are taking warfarin. Make sure that all health care providers, including your dentist, know you are taking warfarin. If you need surgery, talk with your health care provider about whether you should stop taking warfarin before your surgery. Avoid situations that cause bleeding. You may bleed more easily while taking warfarin. To limit bleeding, take the following actions: Use a softer toothbrush. Floss with waxed floss, not unwaxed floss. Shave with an electric razor, not with a blade. Limit your use of sharp objects. Avoid potentially harmful activities, such as contact sports. What do I need to know about warfarin and pregnancy or breastfeeding? Warfarin is not recommended during the first trimester of pregnancy due to an increased risk of birth  defects. In certain situations, a woman may take warfarin after her first trimester of pregnancy. If you are taking warfarin and you become pregnant or plan to become pregnant, contact your health care provider right away. If you plan to breastfeed while taking warfarin, talk with your health care provider first. What do I need to know about warfarin and alcohol or drug use? Avoid drinking alcohol, or limit alcohol intake to no more than 1 drink a day for nonpregnant women and 2 drinks a day for men. One drink equals 12 oz of beer, 5 oz of wine, or 1 oz of hard liquor. If you change the amount of alcohol that you drink, tell your health care provider. Your warfarin dosage may need to be changed. Avoid tobacco products, such as cigarettes, chewing tobacco, and e-cigarettes. If you need help quitting, ask your health care provider. If you change the amount of nicotine or tobacco that you use, tell your health care provider. Your warfarin dosage may need to be changed. Avoid street drugs while taking warfarin. The effects of street drugs on warfarin are not known. What do I need to know  about warfarin and other medicines or supplements? Many prescription and over-the-counter medicines can interfere with warfarin. Talk with your health care provider or your pharmacist before starting or stopping any new medicines. This includes over-the-counter vitamins, dietary supplements, herbal medicines, and pain medicines. Your warfarin dosage may need to be adjusted. Some common over-the-counter medicines that may increase the risk of bleeding while taking warfarin include: Acetaminophen. Aspirin. NSAIDs, such as ibuprofen or naproxen. Vitamin E. What do I need to know about warfarin and my diet? It is important to maintain a normal, balanced diet while taking warfarin. Avoid major changes in your diet. If you are going to change your diet, talk with your health care provider before making changes. Your health  care provider may recommend that you work with a diet and nutrition specialist (dietitian). Vitamin K decreases the effect of warfarin, and it is found in many foods. Eat a consistent amount of foods that contain vitamin K. For example, you may decide to eat 2 vitamin K-containing foods each day. Most foods that are high in vitamin K are green and leafy. Common foods that contain high amounts of vitamin K include: Kale, raw or cooked. Spinach, raw or cooked. Collards, raw or cooked. Swiss chard, raw or cooked. Mustard greens, raw or cooked. Turnip greens, raw or cooked. Parsley, raw. Broccoli, cooked. Noodles, eggs, and spinach, enriched. Brussels sprouts, raw or cooked. Beet greens, raw or cooked. Endive, raw. Cabbage, cooked. Asparagus, cooked. Foods that contain moderate amounts of vitamin K include: Broccoli, raw. Cabbage, raw. Bok choy, cooked. Green leaf lettuce, raw Prunes, stewed. Angie Fava. Kiwi. Edamame, cooked. Romaine lettuce, raw. Avocado. Tuna, canned in oil. Okra, cooked. Black-eyed peas, cooked. Green beans, cooked or raw. Blueberries, raw. Blackberries, raw. Peas, cooked or raw. Contact a health care provider if: You miss a dose. You take an extra dose. You plan to have any kind of surgery or procedure. You are unable to take your medicine due to nausea, vomiting, or diarrhea. You have any major changes in your diet or you plan to make any major changes in your diet. You start or stop any over-the-counter medicine, prescription medicine, or dietary supplement. You become pregnant, plan to become pregnant, or think you may be pregnant. You have menstrual periods that are heavier than usual. You have unusual bruising. Get help right away if: You develop symptoms of an allergic reaction, such as: Swelling of the lips, face, tongue, mouth, or throat. Rash. Itching. Itchy, red, swollen areas of skin (hives). Trouble breathing. Chest tightness. You  have: Signs or symptoms of a stroke. Signs or symptoms of a blood clot. A fall or have an accident, especially if you hit your head. Blood in your urine. Your urine may look reddish, pinkish, or tea-colored. Blood in your stool. Your stool may be black or bright red. Bleeding that does not stop after applying pressure to the area for 30 minutes. Severe pain in your joints or back. Purple or blue toes. Skin ulcers that do not go away. You vomit blood or cough up blood. The blood may be bright red, or it may look like coffee grounds. These symptoms may represent a serious problem that is an emergency. Do not wait to see if the symptoms will go away. Get medical help right away. Call your local emergency services (911 in the [place] Do not drive yourself to the hospital.  Summary Warfarin needs to be closely monitored with blood tests. It is very important to keep all lab visits  and follow-up visits with your health care provider. Make sure that you know your target INR range and your warfarin dosage. Wear or carry identification that says that you are taking warfarin. Take warfarin at the same time every day. Call your health care provider if you miss a dose or if you take an extra dose. Do not change the dosage of warfarin on your own. Know the signs and symptoms of blood clots, bleeding, and a stroke. Know when to get emergency medical help. Tell all health care providers who care for you that you are taking warfarin. Talk with your health care provider or your pharmacist before starting or stopping any new medicines. Monitor how much vitamin K you eat every day. Try to eat the same amount every day. This information is not intended to replace advice given to you by your health care provider. Make sure you discuss any questions you have with your health care provider. Document Released: 07/21/2005 Document Revised: 04/01/2016 Document Reviewed: 10/17/2015 Elsevier Interactive Patient Education   2017 [employer] Coronary Artery Bypass Grafting, Care After  Refer to this sheet in the next few weeks. These instructions provide you with information on caring for yourself after your procedure. Your health care provider may also give you more specific instructions. Your treatment has been planned according to current medical practices, but problems sometimes occur. Call your health care provider if you have any problems or questions after your procedure. WHAT TO EXPECT AFTER THE PROCEDURE Recovery from surgery will be different for everyone. Some people feel well after 3 or 4 weeks, while for others it takes longer. After your procedure, it is typical to have the following:  Nausea and a lack of appetite.   Constipation.  Weakness and fatigue.   Depression or irritability.   Pain or discomfort at your incision site. HOME CARE INSTRUCTIONS  Take medicines only as directed by your health care provider. Do not stop taking medicines or start any new medicines without first checking with your health care provider.  Take your pulse as directed by your health care provider.  Perform deep breathing as directed by your health care provider. If you were given a device called an incentive spirometer, use it to practice deep breathing several times a day. Support your chest with a pillow or your arms when you take deep breaths or cough.  Keep incision areas clean, dry, and protected. Remove or change any bandages (dressings) only as directed by your health care provider. You may have skin adhesive strips over the incision areas. Do not take the strips off. They will fall off on their own.  Check incision areas daily for any swelling, redness, or drainage.  If incisions were made in your legs, do the following:  Avoid crossing your legs.   Avoid sitting for long periods of time. Change positions every 30 minutes.   Elevate your legs when you are sitting.  Wear compression  stockings as directed by your health care provider. These stockings help keep blood clots from forming in your legs.  Take showers once your health care provider approves. Until then, only take sponge baths. Pat incisions dry. Do not rub incisions with a washcloth or towel. Do not take baths, swim, or use a hot tub until your health care provider approves.  Eat foods that are high in fiber, such as raw fruits and vegetables, whole grains, beans, and nuts. Meats should be lean cut. Avoid canned, processed, and fried foods.  Drink enough  fluid to keep your urine clear or pale yellow.  Weigh yourself every day. This helps identify if you are retaining fluid that may make your heart and lungs work harder.  Rest and limit activity as directed by your health care provider. You may be instructed to:  Stop any activity at once if you have chest pain, shortness of breath, irregular heartbeats, or dizziness. Get help right away if you have any of these symptoms.  Move around frequently for short periods or take short walks as directed by your health care provider. Increase your activities gradually. You may need physical therapy or cardiac rehabilitation to help strengthen your muscles and build your endurance.  Avoid lifting, pushing, or pulling anything heavier than 10 lb (4.5 kg) for at least 6 weeks after surgery.  Do not drive until your health care provider approves.  Ask your health care provider when you may return to work.  Ask your health care provider when you may resume sexual activity.  Keep all follow-up visits as directed by your health care provider. This is important. SEEK MEDICAL CARE IF:  You have swelling, redness, increasing pain, or drainage at the site of an incision.  You have a fever.  You have swelling in your ankles or legs.  You have pain in your legs.   You gain 2 or more pounds (0.9 kg) a day.  You are nauseous or vomit.  You have diarrhea. SEEK  IMMEDIATE MEDICAL CARE IF:  You have chest pain that goes to your jaw or arms.  You have shortness of breath.   You have a fast or irregular heartbeat.   You notice a "clicking" in your breastbone (sternum) when you move.   You have numbness or weakness in your arms or legs.  You feel dizzy or light-headed.  MAKE SURE YOU:  Understand these instructions.  Will watch your condition.  Will get help right away if you are not doing well or get worse. This information is not intended to replace advice given to you by your health care provider. Make sure you discuss any questions you have with your health care provider. Document Released: 02/07/2005 Document Revised: 08/11/2014 Document Reviewed: 12/28/2012 Elsevier Interactive Patient Education  2017 [employer].

## 2016-09-28 NOTE — Progress Notes (Addendum)
      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 He is talking on the phone.  Objective: Vital signs in last 24 hours: Temp:  [97.5 F (36.4 C)-100.3 F (37.9 C)] 97.5 F (36.4 C) (02/25 0324) Pulse Rate:  [73-88] 73 (02/25 0324) Cardiac Rhythm: Normal sinus rhythm (02/25 0735) Resp:  [18-19] 18 (02/25 0324) BP: (97-115)/(66-73) 107/68 (02/25 0324) SpO2:  [95 %-98 %] 95 % (02/25 0324) Weight:  [217 lb 11.2 oz (98.7 kg)] 217 lb 11.2 oz (98.7 kg) (02/25 0324)  Pre op weight 100.6 kg Current Weight  09/28/16 217 lb 11.2 oz (98.7 kg)       Intake/Output from previous day: 02/24 0701 - 02/25 0700 In: -  Out: 150 [Urine:150]   Physical Exam:  Cardiovascular: Stuart Henry Pulmonary: Clear to auscultation bilaterally Abdomen: Soft, non tender, bowel sounds present. Extremities: Trace  bilateral lower extremity edema. Wounds: Clean and dry.  No erythema or signs of infection.  Lab Results: CBC:  Recent Labs  09/26/16 0356 09/27/16 0329  WBC 6.1 6.8  HGB 9.5* 10.4*  HCT 28.4* 31.4*  PLT 106* 123*   BMET:   Recent Labs  09/26/16 0356 09/27/16 0329  NA 132* 135  K 4.0 4.3  CL 99* 98*  CO2 25 26  GLUCOSE 101* 113*  BUN 14 17  CREATININE 1.04 1.17  CALCIUM 8.5* 9.0    PT/INR:  Lab Results  Component Value Date   INR 1.23 09/28/2016   INR 1.38 09/24/2016   INR 1.08 09/23/2016   ABG:  INR: Will add last result for INR, ABG once components are confirmed Will add last 4 CBG results once components are confirmed  Assessment/Plan:  1. CV - Previous a fib with CVR. Maintaining SR in the 60's. On Lopressor 12.5 mg bid,  Amiodarone 200 mg daily  and Coumadin. Will have to determine if needs to continue Coumadin as has converted to SR.  INR 1.23 this am. Continue with Coumadin 2.5 mg. 2.  Pulmonary - On room air. Encourage incentive spirometer 3.  Acute blood loss anemia - H and H stable at 10.4 and 31.4 4. DM-CBGs 118/118/97. On Glipizide 10 mg daily, Metformin 1000 mg bid, Prandin 1 mg bid, and  Insulin. Pre op HGA1C 9.3. He will need close medical follow up after discharge. 5. Mild thrombocytopenia-last platelets up to 123,000 6. Remove EPW  7. Hope to discharge soon  ZIMMERMAN,DONIELLE MPA-C 09/28/2016,8:02 AM   Cardiology consult requested , Dr Radford Pax talked to PA.  Now holding sinus  Coumadin started  Poss home in am I have seen and examined Stuart Henry and agree with the above assessment  and plan.  Grace Isaac MD Beeper [phone] Office [phone] 09/28/2016 11:01 AM

## 2016-09-29 LAB — PROTIME-INR
INR: 1.17
Prothrombin Time: 15 seconds (ref 11.4–15.2)

## 2016-09-29 LAB — GLUCOSE, CAPILLARY
GLUCOSE-CAPILLARY: 159 mg/dL — AB (ref 65–99)
GLUCOSE-CAPILLARY: 155 mg/dL — AB (ref 65–99)
Glucose-Capillary: 95 mg/dL (ref 65–99)
Glucose-Capillary: 124 mg/dL — ABNORMAL HIGH (ref 65–99)

## 2016-09-29 MED ORDER — LACTULOSE 10 GM/15ML PO SOLN: 20.0000 g | Freq: Every day | ORAL | Status: DC | PRN

## 2016-09-29 MED ORDER — AMIODARONE HCL 200 MG PO TABS: 200.0000 mg | ORAL_TABLET | Freq: Two times a day (BID) | ORAL | 1 refills | Status: DC

## 2016-09-29 MED ORDER — APIXABAN 2.5 MG PO TABS
2.5000 mg | ORAL_TABLET | Freq: Two times a day (BID) | ORAL | Status: DC
  Administered 2016-09-29: 2.5 mg via ORAL
  Filled 2016-09-29: qty 1

## 2016-09-29 MED ORDER — METOPROLOL TARTRATE 25 MG PO TABS: 12.5000 mg | ORAL_TABLET | Freq: Two times a day (BID) | ORAL | 1 refills | Status: DC

## 2016-09-29 MED ORDER — WARFARIN SODIUM 5 MG PO TABS: 5.0000 mg | ORAL_TABLET | Freq: Every day | ORAL | Status: DC

## 2016-09-29 MED ORDER — APIXABAN 2.5 MG PO TABS: 2.5000 mg | ORAL_TABLET | Freq: Two times a day (BID) | ORAL | 1 refills | Status: DC

## 2016-09-29 MED ORDER — INSULIN DETEMIR 100 UNIT/ML ~~LOC~~ SOLN: 10.0000 [IU] | Freq: Every day | SUBCUTANEOUS | 1 refills | Status: DC

## 2016-09-29 MED ORDER — OXYCODONE HCL 5 MG PO TABS: 5.0000 mg | ORAL_TABLET | Freq: Four times a day (QID) | ORAL | 0 refills | Status: DC | PRN

## 2016-09-29 MED ORDER — AMIODARONE HCL 200 MG PO TABS
200.0000 mg | ORAL_TABLET | Freq: Two times a day (BID) | ORAL | Status: DC
  Administered 2016-09-29: 200 mg via ORAL
  Filled 2016-09-29: qty 1

## 2016-09-29 MED ORDER — INSULIN DETEMIR 100 UNIT/ML ~~LOC~~ SOLN: 20.0000 [IU] | Freq: Every day | SUBCUTANEOUS | 11 refills | Status: DC

## 2016-09-29 NOTE — Progress Notes (Signed)
Diabetes education with teach back completed with patient. He injected his own insulin and gave understanding of procedure. Also went over education with insulin pen with patient and spouse. No further questions at this time.  Cyndia Bent RN

## 2016-09-29 NOTE — Progress Notes (Signed)
CARDIAC REHAB PHASE I   PRE:  Rate/Rhythm: 82 SR with PVCs    BP: sitting 129/80    SaO2: 100 RA  MODE:  Ambulation: 350 ft   POST:  Rate/Rhythm: 88 SR    BP: sitting 137/93     SaO2: 100 RA  Pt moving fairly well. Using RW at first but picking up back legs of RW so I took it away. Slight unsteadiness, min assist. Pt reluctant to walk far. No afib. Doing well.  [id number]   [place], ACSM 09/29/2016 11:42 AM

## 2016-09-29 NOTE — Progress Notes (Addendum)
Inpatient Diabetes Program Recommendations  AACE/ADA: New Consensus Statement on Inpatient Glycemic Control (2015)  Target Ranges:  Prepandial:   less than 140 mg/dL      Peak postprandial:   less than 180 mg/dL (1-2 hours)      Critically ill patients:  140 - 180 mg/dL   Lab Results  Component Value Date   GLUCAP 155 (H) 09/29/2016   HGBA1C 9.3 (H) 09/23/2016   Results for MAINOR, HELLMANN (MRN [id number]) as of 09/29/2016 12:36  Ref. Range 09/28/2016 06:30 09/28/2016 11:09 09/28/2016 17:33 09/28/2016 21:27 09/29/2016 06:38 09/29/2016 08:16 09/29/2016 11:49  Glucose-Capillary Latest Ref Range: 65 - 99 mg/dL 97 103 (H) 105 (H) 92 95 159 (H) 155 (H)   Diabetes history: Type 2 diabetes Outpatient Diabetes medications: Jardiance 25 mg daily, Glucotrol XL 10 mg daily, Metformin 1000 mg bid, Prandin 1 mg tid with meals Current orders for Inpatient glycemic control:  Levemir 20 units daily, TCTS tid with meals and HS, Glucotrol XL 10 mg daily, Prandin 1 mg bid  Inpatient Diabetes Program Recommendations:    Referral received.  Reviewed insulin pen with patient and wife again.  Discussed priming pen with 2 units of insulin and then dialing up dose.  Note that patient's blood sugars have been well controlled.    Fasting CBG<100 mg/dL. May consider reducing Levemir to 10 units daily.  Also consider d/c of Prandin since patient is on Glucotrol?  Gave patient and wife information regarding insulin pen video (511) and teaching kit as well.  Reviewed hypoglycemia signs and symptoms and treatment as well.  RN to teach insulin administration with vial and syringe as well.   Note at discharge consider Levemir Flexpen and insulin pen needles [phone]  Will need to follow-up with PCP.   Thanks, Adah Perl, RN, BC-ADM Inpatient Diabetes Coordinator Pager [phone] (8a-5p)

## 2016-09-29 NOTE — Progress Notes (Signed)
Physical Therapy Treatment Patient Details Name: Stuart Henry MRN: [id number] DOB: 04-Jul-1941 Today's Date: 09/29/2016    History of Present Illness Pt adm for CABG x 3. PMH - DM, neuropathy, gout, HTN    PT Comments    Pt progressing well toward goals. Pt able to recall all sternal precautions and requires less physical assist for mobility. Pt demonstrates improved balance and is able to ambulate without use of AD. Pt educated on use of RW for longer distance (i.e. Grocery shopping) for safety until endurance improves. Current d/c plan remains appropriate when medically ready. Will continue to follow.    Follow Up Recommendations  No PT follow up;Supervision for mobility/OOB     Equipment Recommendations  None recommended by PT    Recommendations for Other Services       Precautions / Restrictions Precautions Precautions: Sternal Precaution Comments: reviewed sternal precautions Restrictions Weight Bearing Restrictions: Yes (sternal precautions)    Mobility  Bed Mobility Overal bed mobility: Modified Independent Bed Mobility: Supine to Sit;Sit to Supine     Supine to sit: Modified independent (Device/Increase time) Sit to supine: Modified independent (Device/Increase time)   General bed mobility comments: increased time  Transfers Overall transfer level: Needs assistance Equipment used: None Transfers: Sit to/from Stand Sit to Stand: Supervision         General transfer comment: supervision for safety; hands on knees to follow sternal precautions  Ambulation/Gait Ambulation/Gait assistance: Min guard Ambulation Distance (Feet): 200 Feet Assistive device: None Gait Pattern/deviations: Step-through pattern;Decreased step length - right;Decreased step length - left;Decreased stride length Gait velocity: decreased Gait velocity interpretation: Below normal speed for age/gender General Gait Details: able to ambulate without AD; pt reaches for railings on  wall but when brought to his attention no longer needs to do so and reports that he does so out of habit   Stairs Stairs: Yes   Stair Management: One rail Right;Step to pattern;Forwards Number of Stairs: 12 General stair comments: step to pattern, reports greater difficulty with descending due to LE muscle weakness; increased time; verbal cues to use UE for balance assist and not to push/pull on rail   Wheelchair Mobility    Modified Rankin (Stroke Patients Only)       Balance Overall balance assessment: Needs assistance Sitting-balance support: No upper extremity supported;Feet supported Sitting balance-Leahy Scale: Good Sitting balance - Comments: sat EOB x 3 min without LOB   Standing balance support: No upper extremity supported Standing balance-Leahy Scale: Good Standing balance comment: able to stand to talk to physician x 3 min without support or LOB                    Cognition Arousal/Alertness: Awake/alert Behavior During Therapy: WFL for tasks assessed/performed Overall Cognitive Status: Within Functional Limits for tasks assessed                 General Comments: recalls 5/5 sternal precautions    Exercises      General Comments        Pertinent Vitals/Pain Pain Assessment: No/denies pain    Home Living                      Prior Function            PT Goals (current goals can now be found in the care plan section) Acute Rehab PT Goals Patient Stated Goal: return home Progress towards PT goals: Progressing toward goals    Frequency  Min 3X/week      PT Plan Current plan remains appropriate    Co-evaluation             End of Session Equipment Utilized During Treatment: Gait belt Activity Tolerance: Patient tolerated treatment well Patient left: in bed;with call bell/phone within reach;with family/visitor present Nurse Communication: Mobility status PT Visit Diagnosis: Unsteadiness on feet (R26.81);Other  abnormalities of gait and mobility (R26.89);Muscle weakness (generalized) (M62.81)     Time: [id number] PT Time Calculation (min) (ACUTE ONLY): 22 min  Charges:  $Gait Training: 8-22 mins                    G Codes:       Tracie Harrier October 04, 2016, 4:05 PM

## 2016-09-29 NOTE — Progress Notes (Addendum)
[address]       , [id number]             [phone]      5 Days Post-Op Procedure(s) (LRB): CORONARY ARTERY BYPASS GRAFTING (CABG) x four, using left internal mammary artery and right leg greater saphenous vein harvested endoscopically (N/A) TRANSESOPHAGEAL ECHOCARDIOGRAM (TEE) (N/A) Subjective: Feels well  Objective: Vital signs in last 24 hours: Temp:  [97.7 F (36.5 C)-98.4 F (36.9 C)] 98.3 F (36.8 C) (02/26 0527) Pulse Rate:  [67-78] 69 (02/26 0527) Cardiac Rhythm: Normal sinus rhythm (02/25 1900) Resp:  [18] 18 (02/26 0527) BP: (99-127)/(67-71) 127/71 (02/26 0527) SpO2:  [95 %-97 %] 97 % (02/26 0527) Weight:  [216 lb 6.4 oz (98.2 kg)] 216 lb 6.4 oz (98.2 kg) (02/26 0527)  Hemodynamic parameters for last 24 hours:    Intake/Output from previous day: 02/25 0701 - 02/26 0700 In: -  Out: 550 [Urine:550] Intake/Output this shift: No intake/output data recorded.  General appearance: alert, cooperative and no distress Heart: regular rate and rhythm Lungs: clear to auscultation bilaterally Abdomen: benign Extremities: no edema Wound: incis healing well  Lab Results:  Recent Labs  09/27/16 0329  WBC 6.8  HGB 10.4*  HCT 31.4*  PLT 123*   BMET:  Recent Labs  09/27/16 0329  NA 135  K 4.3  CL 98*  CO2 26  GLUCOSE 113*  BUN 17  CREATININE 1.17  CALCIUM 9.0    PT/INR:  Recent Labs  09/29/16 0224  LABPROT 15.0  INR 1.17   ABG    Component Value Date/Time   PHART 7.345 (L) 09/24/2016 1902   HCO3 22.0 09/24/2016 1902   TCO2 24 09/25/2016 1741   ACIDBASEDEF 3.0 (H) 09/24/2016 1902   O2SAT 96.0 09/24/2016 1902   CBG (last 3)   Recent Labs  09/28/16 1733 09/28/16 2127 09/29/16 0638  GLUCAP 105* 92 95    Meds Scheduled Meds: . acetaminophen  1,000 mg Oral Q6H   Or  . acetaminophen (TYLENOL) oral liquid 160 mg/5 mL  1,000 mg Per Tube Q6H  . allopurinol  100 mg Oral Daily  . amiodarone  200 mg Oral  Daily  . aspirin EC  81 mg Oral Daily  . atorvastatin  40 mg Oral QHS  . bisacodyl  10 mg Oral Daily   Or  . bisacodyl  10 mg Rectal Daily  . docusate sodium  200 mg Oral Daily  . folic acid  1 mg Oral Daily  . gabapentin  300 mg Oral QHS  . glipiZIDE  10 mg Oral Q breakfast  . insulin aspart  0-24 Units Subcutaneous TID AC & HS  . insulin detemir  20 Units Subcutaneous Daily  . insulin starter kit- pen needles  1 kit Other Once  . living well with diabetes book   Does not apply Once  . mouth rinse  15 mL Mouth Rinse BID  . metFORMIN  1,000 mg Oral BID WC  . metoprolol tartrate  12.5 mg Oral BID   Or  . metoprolol tartrate  12.5 mg Per Tube BID  . pantoprazole  40 mg Oral Daily  . patient's guide to using coumadin book   Does not apply Once  . potassium chloride (KCL MULTIRUN) 30 mEq in 265 mL IVPB  30 mEq Intravenous Once  . repaglinide  1 mg Oral BID AC  . sodium chloride flush  3 mL Intravenous Q12H  . sodium chloride  flush  3 mL Intravenous Q12H  . warfarin  2.5 mg Oral q1800  . Warfarin - Physician Dosing Inpatient   Does not apply q1800   Continuous Infusions: . sodium chloride 20 mL/hr (09/24/16 1345)   PRN Meds:.sodium chloride, sodium chloride, metoprolol, ondansetron (ZOFRAN) IV, oxyCODONE, sodium chloride flush, sodium chloride flush, traMADol  Xrays Dg Chest 2 View  Result Date: 09/27/2016 CLINICAL DATA:  Status post CABG 09/25/2016.  Pleural effusion. EXAM: CHEST  2 VIEW COMPARISON:  Single-view of the chest 09/26/2016 and 09/25/2016. FINDINGS: Right IJ sheath has been removed. There are small bilateral pleural effusions and basilar atelectasis, greater on the left. No pneumothorax. Heart size is mildly enlarged. Five intact median sternotomy wires are unchanged. IMPRESSION: Some improvement in small bilateral pleural effusions and basilar atelectasis, worse on the left. No new abnormality. Status post removal of right IJ sheath. Electronically Signed   By: Inge Rise M.D.   On: 09/27/2016 09:19    Assessment/Plan: S/P Procedure(s) (LRB): CORONARY ARTERY BYPASS GRAFTING (CABG) x four, using left internal mammary artery and right leg greater saphenous vein harvested endoscopically (N/A) TRANSESOPHAGEAL ECHOCARDIOGRAM (TEE) (N/A)   1 doing well, some intermit afib/PAC's- increase amio to BID 2 INR only 1.17- increase coumadin dose 3 sugars well controlled - he has been shown how to give insulin once- will ask diabetic educator to see as he previously wasn't on insulin, have nurse instruct also on injections 4 no recent BM- will add lactulose   LOS: 5 days    GOLD,WAYNE E 09/29/2016 Patient seen and examined, agree with above In SR, ambulating with Rehab Will dc later today Will keep on coumadin for 6 weeks. Home on 5 mg a day. Check INR on Thursday  Rande Roylance C. Roxan Hockey, MD [facility] [phone]

## 2016-09-29 NOTE — Care Management Important Message (Signed)
Important Message  Patient Details  Name: Stuart Henry MRN: [id number] Date of Birth: 1941-06-19   Medicare Important Message Given:  Yes    Nathen May 09/29/2016, 1:33 PM

## 2016-09-29 NOTE — Progress Notes (Signed)
Removed 2 chest sutures, cleansed, placed steri strips. Patient tolerated well.  Cyndia Bent RN

## 2016-10-01 ENCOUNTER — Telehealth: Payer: Self-pay | Admitting: Surgical

## 2016-10-01 NOTE — Telephone Encounter (Signed)
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  PROCEDURE:  Median sternotomy, extracorporeal circulation, coronary artery bypass grafting x4 (left internal mammary artery to left anterior descending, saphenous vein graft to first diagonal, saphenous vein graft to first and second branches of ramus intermedius), endoscopic vein harvest right thigh.  SURGEON:  Revonda Standard. Roxan Hockey, M.D.  ASSISTANT:  John Giovanni, P.A.-C.  ANESTHESIA:  General.  FINDINGS:  Preserved left ventricular function with no significant valvular pathology by TEE.  Good quality conduits.  The LAD and ramus 2 were intramyocardial.  Good quality targets.  Discharged home on 2/26  Medications: Allergies as of 10/01/2016      Reactions   No Known Allergies       Medication List       Accurate as of 10/01/16 10:15 AM. Always use your most recent med list.          ACCU-CHEK AVIVA PLUS test strip Generic drug:  glucose blood Check blood sugar once  daily   ACCU-CHEK AVIVA PLUS w/Device Kit Use to check blood sugar daily as directed   allopurinol 100 MG tablet Commonly known as:  ZYLOPRIM TAKE 1 TABLET DAILY   amiodarone 200 MG tablet Commonly known as:  PACERONE Take 1 tablet (200 mg total) by mouth 2 (two) times daily.   apixaban 2.5 MG Tabs tablet Commonly known as:  ELIQUIS Take 1 tablet (2.5 mg total) by mouth 2 (two) times daily.   aspirin 81 MG tablet Take 81 mg by mouth at bedtime.   atorvastatin 40 MG tablet Commonly known as:  LIPITOR Take 1 tablet (40 mg total) by mouth daily.   empagliflozin 25 MG Tabs  tablet Commonly known as:  JARDIANCE Take 25 mg by mouth daily.   fluticasone 50 MCG/ACT nasal spray Commonly known as:  FLONASE Place 2 sprays into both nostrils daily.   folic acid 633 MCG tablet Commonly known as:  FOLVITE Take 800 mcg by mouth daily.   gabapentin 300 MG capsule Commonly known as:  NEURONTIN TAKE ONE CAPSULE BY MOUTH AT BEDTIME FOR NEUROPATHY   glipiZIDE 10 MG 24 hr tablet Commonly known as:  GLUCOTROL XL Take 1 tablet (10 mg total) by mouth daily with breakfast.   insulin detemir 100 UNIT/ML injection Commonly known as:  LEVEMIR Inject 0.1 mLs (10 Units total) into the skin daily. Please dispense flex pen equivalent   metFORMIN 1000 MG tablet Commonly known as:  GLUCOPHAGE Take 1,000 mg by mouth 2 (two) times daily with a meal.   metoprolol tartrate 25 MG tablet Commonly known as:  LOPRESSOR Take 0.5 tablets (12.5 mg total) by mouth 2 (two) times daily.   oxyCODONE 5 MG immediate release tablet Commonly known as:  Oxy IR/ROXICODONE Take 1-2 tablets (5-10 mg total) by mouth every 6 (six) hours as needed for severe pain.   pioglitazone 45 MG tablet Commonly known as:  ACTOS Take 1 tablet (45 mg  total) by mouth daily.   Vitamin B-12 5000 MCG Tbdp Take 5,000 mcg by mouth daily.       Coumadin:No, on eliquis  Problems/Concerns: doing well giving himself insulin   Assessment:  Patient is doing well.   Further instructions  provided.  Contact office if concerns or problems develop  Follow up Appointment: scheduled  

## 2016-10-06 ENCOUNTER — Encounter: Payer: Self-pay | Admitting: Family Medicine

## 2016-10-06 ENCOUNTER — Ambulatory Visit (INDEPENDENT_AMBULATORY_CARE_PROVIDER_SITE_OTHER): Payer: Medicare Other | Admitting: Family Medicine

## 2016-10-06 VITALS — BP 98/64 | HR 60 | Temp 97.8°F | Resp 16 | Wt 211.0 lb

## 2016-10-06 DIAGNOSIS — E1165 Type 2 diabetes mellitus with hyperglycemia: Secondary | ICD-10-CM | POA: Diagnosis not present

## 2016-10-06 DIAGNOSIS — E1140 Type 2 diabetes mellitus with diabetic neuropathy, unspecified: Secondary | ICD-10-CM | POA: Diagnosis not present

## 2016-10-06 DIAGNOSIS — IMO0002 Reserved for concepts with insufficient information to code with codable children: Secondary | ICD-10-CM

## 2016-10-06 DIAGNOSIS — Z951 Presence of aortocoronary bypass graft: Secondary | ICD-10-CM

## 2016-10-06 NOTE — Progress Notes (Signed)
Patient: Stuart Henry Male    DOB: Apr 29, 1941   76 y.o.   MRN: [id number] Visit Date: 10/06/2016  Today's Provider: [name], MD   Chief Complaint  Patient presents with  . Hospitalization Follow-up   Subjective:    HPI  Follow up Hospitalization  Patient was admitted to [facility] on 09/24/2016 and discharged on 09/29/2016. He was treated for Coronary Artery Bypass.  Treatment for this included discontinuing Benazepril/HCTZ 20/12.33m and Repaglinide 130m He reports good compliance with treatment. He reports this condition is Improved.      Diabetes Mellitus Type II, Follow-up:   Lab Results  Component Value Date   HGBA1C 9.3 (H) 09/23/2016   HGBA1C 9.5 08/25/2016   HGBA1C 8.1 05/13/2016    Last seen for diabetes 2 months ago.  Management since then includes adding insulin 10 units daily. He reports good compliance with treatment. He is not having side effects.  Current symptoms include none and have been stable. Home blood sugar records: fasting range: 140s  Episodes of hypoglycemia? no   Weight trend: stable Prior visit with dietician: no Current diet: well balanced, low fat/ cholesterol Current exercise: none  Pertinent Labs:    Component Value Date/Time   CHOL 179 03/28/2016 0920   TRIG 246 (H) 03/28/2016 0920   HDL 33 (L) 03/28/2016 0920   LDLCALC 97 03/28/2016 0920   CREATININE 1.17 09/27/2016 0329    Wt Readings from Last 3 Encounters:  10/06/16 211 lb (95.7 kg)  09/29/16 216 lb 6.4 oz (98.2 kg)  09/23/16 212 lb 12.8 oz (96.5 kg)    He was started on Levemir 30 units a day prior to discharge. However he has had several days with fasting sugars in the 90as and 100s and is now only taking Levemir when fastings are over 125. He has started on a very low carbohydrate diet and feels fairly well aside from soreness in chest from CABG. He has follow up with Dr. EnSaunders Revelater this week.        Allergies  Allergen Reactions  . No  Known Allergies      Current Outpatient Prescriptions:  .  ACCU-CHEK AVIVA PLUS test strip, Check blood sugar once  daily, Disp: 100 each, Rfl: 4 .  allopurinol (ZYLOPRIM) 100 MG tablet, TAKE 1 TABLET DAILY, Disp: 90 tablet, Rfl: 4 .  amiodarone (PACERONE) 200 MG tablet, Take 1 tablet (200 mg total) by mouth 2 (two) times daily., Disp: 60 tablet, Rfl: 1 .  apixaban (ELIQUIS) 2.5 MG TABS tablet, Take 1 tablet (2.5 mg total) by mouth 2 (two) times daily., Disp: 60 tablet, Rfl: 1 .  aspirin 81 MG tablet, Take 81 mg by mouth at bedtime. , Disp: , Rfl:  .  atorvastatin (LIPITOR) 40 MG tablet, Take 1 tablet (40 mg total) by mouth daily. (Patient taking differently: Take 40 mg by mouth at bedtime. ), Disp: 30 tablet, Rfl: 5 .  Blood Glucose Monitoring Suppl (ACCU-CHEK AVIVA PLUS) w/Device KIT, Use to check blood sugar daily as directed, Disp: 1 kit, Rfl: 0 .  Cyanocobalamin (VITAMIN B-12) 5000 MCG TBDP, Take 5,000 mcg by mouth daily., Disp: , Rfl:  .  empagliflozin (JARDIANCE) 25 MG TABS tablet, Take 25 mg by mouth daily., Disp: 30 tablet, Rfl: 6 .  gabapentin (NEURONTIN) 300 MG capsule, TAKE ONE CAPSULE BY MOUTH AT BEDTIME FOR NEUROPATHY, Disp: 90 capsule, Rfl: 4 .  glipiZIDE (GLUCOTROL XL) 10 MG 24 hr tablet,  Take 1 tablet (10 mg total) by mouth daily with breakfast., Disp: 90 tablet, Rfl: 3 .  insulin detemir (LEVEMIR) 100 UNIT/ML injection, Inject 0.1 mLs (10 Units total) into the skin daily. Please dispense flex pen equivalent, Disp: 10 mL, Rfl: 1 .  metFORMIN (GLUCOPHAGE) 1000 MG tablet, Take 1,000 mg by mouth 2 (two) times daily with a meal., Disp: , Rfl:  .  metoprolol tartrate (LOPRESSOR) 25 MG tablet, Take 0.5 tablets (12.5 mg total) by mouth 2 (two) times daily., Disp: 30 tablet, Rfl: 1 .  oxyCODONE (OXY IR/ROXICODONE) 5 MG immediate release tablet, Take 1-2 tablets (5-10 mg total) by mouth every 6 (six) hours as needed for severe pain., Disp: 28 tablet, Rfl: 0 .  pioglitazone (ACTOS) 45 MG  tablet, Take 1 tablet (45 mg total) by mouth daily., Disp: 1 tablet, Rfl: 1 .  fluticasone (FLONASE) 50 MCG/ACT nasal spray, Place 2 sprays into both nostrils daily. (Patient taking differently: Place 2 sprays into both nostrils daily as needed for allergies. ), Disp: 16 g, Rfl: 6 .  folic acid (FOLVITE) 414 MCG tablet, Take 800 mcg by mouth daily., Disp: , Rfl:   Review of Systems  Constitutional: Negative.   Respiratory: Negative.   Cardiovascular: Negative.   Endocrine: Negative.   Musculoskeletal: Negative.   Neurological: Negative.     Social History  Substance Use Topics  . Smoking status: Former Smoker    Packs/day: 1.00    Years: 5.00    Types: Cigarettes    Quit date: 08/06/1972  . Smokeless tobacco: Never Used  . Alcohol use No     Comment: 09/26/2016 "quit 01/30/12"   Objective:   BP 98/64 (BP Location: Left Arm, Patient Position: Sitting, Cuff Size: Normal)   Pulse 60   Temp 97.8 F (36.6 C)   Resp 16   Wt 211 lb (95.7 kg)   BMI 25.68 kg/m  Vitals:   10/06/16 1555  BP: 98/64  Pulse: 60  Resp: 16  Temp: 97.8 F (36.6 C)  Weight: 211 lb (95.7 kg)     Physical Exam   General Appearance:    Alert, cooperative, no distress  Eyes:    PERRL, conjunctiva/corneas clear, EOM's intact       Lungs:     Clear to auscultation bilaterally, respirations unlabored  Heart:    Regular rate and rhythm  Neurologic:   Awake, alert, oriented x 3. No apparent focal neurological           defect.           Assessment & Plan:     1. S/P CABG x 4 Asymptomatic. Compliant with medication.  Continue aggressive risk factor modification.  Follow up Dr. Saunders Revel as scheduled.   2. Uncontrolled type 2 diabetes mellitus with diabetic neuropathy, without long-term current use of insulin ([place]) He has started a very low carb diet and anticipates increasing exercise as allowed by cardiology. Can skip insulin for now if fastings are <125. Will reassess in a month.       [name],  MD  [facility]

## 2016-10-06 NOTE — Patient Instructions (Signed)
Reduce Levemir insulin to 20.units a day and may skip if fasting sugar is <125.

## 2016-10-08 ENCOUNTER — Encounter: Payer: Self-pay | Admitting: Internal Medicine

## 2016-10-08 ENCOUNTER — Ambulatory Visit (INDEPENDENT_AMBULATORY_CARE_PROVIDER_SITE_OTHER): Payer: Medicare Other | Admitting: Internal Medicine

## 2016-10-08 ENCOUNTER — Ambulatory Visit
Admission: RE | Admit: 2016-10-08 | Discharge: 2016-10-08 | Disposition: A | Discharge status: Home / Self Care | Payer: Medicare Other | Source: Ambulatory Visit | Attending: Internal Medicine | Admitting: Internal Medicine

## 2016-10-08 VITALS — BP 100/60 | HR 61 | Ht 76.0 in | Wt 207.0 lb

## 2016-10-08 DIAGNOSIS — R05 Cough: Secondary | ICD-10-CM

## 2016-10-08 DIAGNOSIS — I10 Essential (primary) hypertension: Secondary | ICD-10-CM

## 2016-10-08 DIAGNOSIS — J90 Pleural effusion, not elsewhere classified: Secondary | ICD-10-CM | POA: Diagnosis not present

## 2016-10-08 DIAGNOSIS — R0989 Other specified symptoms and signs involving the circulatory and respiratory systems: Secondary | ICD-10-CM | POA: Diagnosis not present

## 2016-10-08 DIAGNOSIS — E785 Hyperlipidemia, unspecified: Secondary | ICD-10-CM | POA: Diagnosis not present

## 2016-10-08 DIAGNOSIS — Z8679 Personal history of other diseases of the circulatory system: Secondary | ICD-10-CM | POA: Insufficient documentation

## 2016-10-08 DIAGNOSIS — I2510 Atherosclerotic heart disease of native coronary artery without angina pectoris: Secondary | ICD-10-CM | POA: Diagnosis not present

## 2016-10-08 DIAGNOSIS — J9811 Atelectasis: Secondary | ICD-10-CM | POA: Diagnosis not present

## 2016-10-08 DIAGNOSIS — R059 Cough, unspecified: Secondary | ICD-10-CM

## 2016-10-08 DIAGNOSIS — Z951 Presence of aortocoronary bypass graft: Secondary | ICD-10-CM | POA: Insufficient documentation

## 2016-10-08 DIAGNOSIS — I480 Paroxysmal atrial fibrillation: Secondary | ICD-10-CM | POA: Diagnosis not present

## 2016-10-08 MED ORDER — APIXABAN 5 MG PO TABS: 5.0000 mg | ORAL_TABLET | Freq: Two times a day (BID) | ORAL | 3 refills | Status: DC

## 2016-10-08 MED ORDER — AMIODARONE HCL 200 MG PO TABS: 200.0000 mg | ORAL_TABLET | Freq: Every day | ORAL | 3 refills | Status: DC

## 2016-10-08 NOTE — Progress Notes (Signed)
Follow-up Outpatient Visit Date: 10/08/2016  Primary Care Provider: [name], MD [address] [place] [address]  Chief Complaint: Follow-up coronary artery disease status post CABG  HPI:  Stuart Henry is a 76 y.o. year-old male with history of coronary artery disease status post CABG (10/2990) complicated by postoperative atrial fibrillation, ischemic cardiomyopathy, type 2 diabetes mellitus, and hypertension, who presents for follow-up of CAD and postoperative atrial fibrillation. I first met the patient on 09/10/16, which time he reported exertional chest pain while exercising on the treadmill that had started over the preceding few weeks. Given his multiple cardiac risk factors, we proceeded with coronary angiography that demonstrated significant two-vessel CAD, including high-grade ostial LAD, ostial ramus, and proximal LCx stenoses (LMCA equivalent). He underwent 5 vessel bypass by Dr. Roxan Hockey on 09/24/16, which was uneventful except for paroxysmal atrial fibrillation in the postoperative period. Stuart Henry was placed on oral amiodarone and apixaban, which he continues today. Since being discharged on 09/29/16, Stuart Henry only complaint is of chest soreness surrounding the median sternotomy. He has not had any exertional chest pain, though he has been limiting his activities. He denies shortness of breath but notes chest congestion and occasional cough, particularly one taking a deep breath. Denies leg edema, palpitations, lightheadedness, orthopnea, and PND. He has been trying to follow a low carbohydrate diet and notes that his blood sugars have been well-controlled without taking basal insulin.  --------------------------------------------------------------------------------------------------  Cardiovascular History & Procedures: Cardiovascular Problems:  Coronary artery disease status post CABG (09/2016)  Ischemic cardiomyopathy  Postoperative atrial  fibrillation  Risk Factors:  Known coronary artery disease, diabetes mellitus, hypertension, age greater than 60, and male gender  Cath/PCI:  LHC (09/16/16): Significant two-vessel CAD with 95% proximal LAD stenosis involving ostium of D1, 90% ostial ramus intermedius stenosis, and at least 50% lesions involving the ostium and mid section of the LCx. RCA with 25% distal stenosis. LVEF 55-60% with normal LVEDP.  CV Surgery:  CABG (09/24/16, Dr. Roxan Hockey): LIMA to LAD, SVG to D1, and sequential SVG to first and second branches of ramus intermedius.  EP Procedures and Devices:  None  Non-Invasive Evaluation(s):  Carotid Doppler (09/23/16): Mild (1-39%) stenoses involving the left and right internal carotid arteries. Vertebral arteries with antegrade flow.  ABIs (09/23/16): Right 1.98 and the left 1.49 suggestive of medial calcification.  Transthoracic echocardiogram (09/19/16): Normal LV size with mildly reduced contraction (EF 45-50%) with basal and mid anterior and anteroseptal hypokinesis. Grade 1 diastolic dysfunction. Mildly dilated left atrium. Normal RV size and function.  Recent CV Pertinent Labs: Lab Results  Component Value Date   CHOL 179 03/28/2016   HDL 33 (L) 03/28/2016   LDLCALC 97 03/28/2016   TRIG 246 (H) 03/28/2016   CHOLHDL 5.4 (H) 03/28/2016   INR 1.17 09/29/2016   K 4.3 09/27/2016   MG 1.9 09/25/2016   BUN 17 09/27/2016   BUN 27 09/10/2016   CREATININE 1.17 09/27/2016    Past medical and surgical history were reviewed and updated in EPIC.  Outpatient Encounter Prescriptions as of 10/08/2016  Medication Sig  . ACCU-CHEK AVIVA PLUS test strip Check blood sugar once  daily  . allopurinol (ZYLOPRIM) 100 MG tablet TAKE 1 TABLET DAILY  . amiodarone (PACERONE) 200 MG tablet Take 1 tablet (200 mg total) by mouth 2 (two) times daily.  Marland Kitchen apixaban (ELIQUIS) 2.5 MG TABS tablet Take 1 tablet (2.5 mg total) by mouth 2 (two) times daily.  Marland Kitchen aspirin 81 MG tablet Take  81 mg by mouth at bedtime.   Marland Kitchen atorvastatin (LIPITOR) 40 MG tablet Take 1 tablet (40 mg total) by mouth daily. (Patient taking differently: Take 40 mg by mouth at bedtime. )  . Blood Glucose Monitoring Suppl (ACCU-CHEK AVIVA PLUS) w/Device KIT Use to check blood sugar daily as directed  . empagliflozin (JARDIANCE) 25 MG TABS tablet Take 25 mg by mouth daily.  Marland Kitchen gabapentin (NEURONTIN) 300 MG capsule TAKE ONE CAPSULE BY MOUTH AT BEDTIME FOR NEUROPATHY  . glipiZIDE (GLUCOTROL XL) 10 MG 24 hr tablet Take 1 tablet (10 mg total) by mouth daily with breakfast.  . insulin detemir (LEVEMIR) 100 UNIT/ML injection Inject 0.1 mLs (10 Units total) into the skin daily. Please dispense flex pen equivalent (Patient taking differently: Inject 20 Units into the skin daily. Please dispense flex pen equivalent)  . metFORMIN (GLUCOPHAGE) 1000 MG tablet Take 1,000 mg by mouth 2 (two) times daily with a meal.  . metoprolol tartrate (LOPRESSOR) 25 MG tablet Take 0.5 tablets (12.5 mg total) by mouth 2 (two) times daily.  . nitroGLYCERIN (NITROSTAT) 0.4 MG SL tablet as needed.  Marland Kitchen oxyCODONE (OXY IR/ROXICODONE) 5 MG immediate release tablet Take 1-2 tablets (5-10 mg total) by mouth every 6 (six) hours as needed for severe pain.  . pioglitazone (ACTOS) 45 MG tablet Take 1 tablet (45 mg total) by mouth daily.  . fluticasone (FLONASE) 50 MCG/ACT nasal spray Place 2 sprays into both nostrils daily. (Patient taking differently: Place 2 sprays into both nostrils daily as needed for allergies. )  . [DISCONTINUED] Cyanocobalamin (VITAMIN B-12) 5000 MCG TBDP Take 5,000 mcg by mouth daily.  . [DISCONTINUED] folic acid (FOLVITE) 828 MCG tablet Take 800 mcg by mouth daily.   No facility-administered encounter medications on file as of 10/08/2016.     Allergies: No known allergies  Social History   Social History  . Marital status: Married    Spouse name: N/A  . Number of children: N/A  . Years of education: N/A   Occupational  History  . Retired    Social History Main Topics  . Smoking status: Former Smoker    Packs/day: 1.00    Years: 5.00    Types: Cigarettes    Quit date: 08/06/1972  . Smokeless tobacco: Never Used  . Alcohol use No     Comment: 09/26/2016 "quit 01/30/12"  . Drug use: No  . Sexual activity: No   Other Topics Concern  . Not on file   Social History Narrative  . No narrative on file    Family History  Problem Relation Age of Onset  . Dementia Mother   . Diabetes Mother   . Cancer Father   . Diabetes Sister   . Diabetes Brother   . Heart disease Other     Review of Systems: Patient notes 10-20 pound weight loss since pre-CABG. Appetite is good. Otherwise, a 12-system review of systems was performed and was negative except as noted in the HPI.  --------------------------------------------------------------------------------------------------  Physical Exam: BP 100/60 (BP Location: Left Arm, Patient Position: Sitting, Cuff Size: Normal)   Pulse 61   Ht _0  (1.93 m)   Wt 207 lb (93.9 kg)   BMI 25.20 kg/m   General:  Well-developed, well-nourished elderly man seated comfortably in the exam room. He is accompanied by his wife. HEENT: No conjunctival pallor or scleral icterus.  Moist mucous membranes.  OP clear. Neck: Supple without lymphadenopathy, thyromegaly, JVD, or HJR.  No carotid bruit. Lungs: Normal work  of breathing.  Diminished and somewhat bronchitic breath sounds at the left lung base. No wheezes or crackles. Heart: Regular rate and rhythm without murmurs, rubs, or gallops.  Non-displaced PMI. Chest: Median sternotomy incision healing without erythema or discharge. Mild anterior chest wall tenderness noted.  Abd: Bowel sounds present.  Soft, NT/ND without hepatosplenomegaly Ext: No lower extremity edema.  Radial, PT, and DP pulses are 2+ bilaterally. Skin: warm and dry without rash  EKG:  Normal sinus rhythm with nonspecific T-wave changes. Inferior Q waves less  pronounced than on prior tracing from 09/25/16 (I have personally reviewed both tracings).  Lab Results  Component Value Date   WBC 6.8 09/27/2016   HGB 10.4 (L) 09/27/2016   HCT 31.4 (L) 09/27/2016   MCV 88.0 09/27/2016   PLT 123 (L) 09/27/2016    Lab Results  Component Value Date   NA 135 09/27/2016   K 4.3 09/27/2016   CL 98 (L) 09/27/2016   CO2 26 09/27/2016   BUN 17 09/27/2016   CREATININE 1.17 09/27/2016   GLUCOSE 113 (H) 09/27/2016   ALT 88 (H) 09/23/2016    Lab Results  Component Value Date   CHOL 179 03/28/2016   HDL 33 (L) 03/28/2016   LDLCALC 97 03/28/2016   TRIG 246 (H) 03/28/2016   CHOLHDL 5.4 (H) 03/28/2016    --------------------------------------------------------------------------------------------------  ASSESSMENT AND PLAN: Coronary artery disease status post CABG Patient is recovering well. He has not had any angina since his surgery. Mild chest wall pain is expected. Incision seems to be healing well. We will continue his current medications for secondary prevention, including aspirin 81 mg daily, atorvastatin 40 mg daily, and metoprolol tartrate 12.5 mg twice a day. I encouraged the patient to participate in cardiac rehab once he has been cleared by Dr. Roxan Hockey.  Postoperative atrial fibrillation EKG today demonstrates sinus rhythm. The patient has not had any palpitations or other symptoms suggestive of recurrent atrial fibrillation. We have agreed to decrease amiodarone to 200 mg daily and increase apixaban to 5 mg twice a day based on recommended dosing for his age, weight, and renal function. Unless he has evidence of recurrent atrial fibrillation, I anticipate discontinuation of amiodarone and apixaban 3 months postop.  Cough and chest congestion Patient notes intermittent cough and congested feeling in the chest. Bronchitic and mildly diminished breath sounds noted at the left base, which may reflect residual atelectasis and/or pleural effusion  following CABG. No fevers or chills. We will obtain a chest x-ray today for further evaluation.  Hypertension Blood pressure is well controlled today. No medication changes at this time.  Hyperlipidemia LDL goal less than 70. Patient is tolerating high intensity statin therapy well. We will plan to recheck a lipid panel when he returns for follow-up.  Diabetes mellitus Patient to continue current regimen. He should continue his current diet and monitor his weight. I will defer further management to Dr. Caryn Section.  Follow-up: Return to clinic in 3 months.  Nelva Bush, MD 10/08/2016 9:00 AM

## 2016-10-08 NOTE — Patient Instructions (Addendum)
Medication Instructions:  Your physician has recommended you make the following change in your medication:  1- CHANGE Amiodarone to 200 mg (1 tablet) by mouth once a day. 2- INCREASE Eliquis to 5 mg by mouth twice a day.   Labwork: none  Testing/Procedures: A chest x-ray takes a picture of the organs and structures inside the chest, including the heart, lungs, and blood vessels. This test can show several things, including, whether the heart is enlarges; whether fluid is building up in the lungs; and whether pacemaker / defibrillator leads are still in place. - Please go to the [facility]. You will check in at the front desk to the right as you walk into the atrium. Valet Parking is offered if needed. - TODAY    Follow-Up: Your physician recommends that you schedule a follow-up appointment in: 3 MONTHS WITH DR END.  If you need a refill on your cardiac medications before your next appointment, please call your pharmacy.    Medication Samples have been provided to the patient.  Drug name: Eliquis       Strength: 5 mg        Qty: 14 tablets(3 boxes) LOT: [id number]  Exp.Date: mar 2020

## 2016-10-10 ENCOUNTER — Other Ambulatory Visit: Payer: Self-pay | Admitting: Internal Medicine

## 2016-10-10 ENCOUNTER — Telehealth: Payer: Self-pay | Admitting: Internal Medicine

## 2016-10-10 MED ORDER — APIXABAN 5 MG PO TABS
5.0000 mg | ORAL_TABLET | Freq: Two times a day (BID) | ORAL | 3 refills | Status: DC
Start: 2016-10-10 — End: 2017-01-01

## 2016-10-10 NOTE — Telephone Encounter (Signed)
Refill sent.

## 2016-10-10 NOTE — Telephone Encounter (Signed)
°*  STAT* If patient is at the pharmacy, call can be transferred to refill team.   1. Which medications need to be refilled? (please list name of each medication and dose if known) Eliquis 5 mg po BID  2. Which pharmacy/location (including street and city if local pharmacy) is medication to be sent to?[facility]   3. Do they need a 30 day or 90 day supply? 90   PATIENT REFILL SENT TO WRONG PHARMACY PLEASE UPDATE RECORDS TO [facility]

## 2016-10-24 ENCOUNTER — Other Ambulatory Visit: Payer: Self-pay | Admitting: Thoracic Surgery (Cardiothoracic Vascular Surgery)

## 2016-10-24 DIAGNOSIS — Z951 Presence of aortocoronary bypass graft: Secondary | ICD-10-CM

## 2016-10-28 ENCOUNTER — Ambulatory Visit (INDEPENDENT_AMBULATORY_CARE_PROVIDER_SITE_OTHER): Payer: Self-pay | Admitting: Thoracic Surgery (Cardiothoracic Vascular Surgery)

## 2016-10-28 ENCOUNTER — Encounter: Payer: Self-pay | Admitting: Thoracic Surgery (Cardiothoracic Vascular Surgery)

## 2016-10-28 ENCOUNTER — Telehealth: Payer: Self-pay | Admitting: Internal Medicine

## 2016-10-28 ENCOUNTER — Ambulatory Visit
Admission: RE | Admit: 2016-10-28 | Discharge: 2016-10-28 | Disposition: A | Discharge status: Home / Self Care | Payer: Medicare Other | Source: Ambulatory Visit | Attending: Thoracic Surgery (Cardiothoracic Vascular Surgery) | Admitting: Thoracic Surgery (Cardiothoracic Vascular Surgery)

## 2016-10-28 VITALS — BP 128/79 | HR 55 | Resp 20 | Ht 76.0 in | Wt 206.0 lb

## 2016-10-28 DIAGNOSIS — I2510 Atherosclerotic heart disease of native coronary artery without angina pectoris: Secondary | ICD-10-CM

## 2016-10-28 DIAGNOSIS — Z951 Presence of aortocoronary bypass graft: Secondary | ICD-10-CM

## 2016-10-28 NOTE — Telephone Encounter (Signed)
Pt is calling to see if Dr. Saunders Revel is going to set up PT for him. Pt states Dr. Roxan Hockey is not going to order PT. Please call.

## 2016-10-28 NOTE — Progress Notes (Signed)
[address]       [place] [address]             [phone]    HPI: [name] returns today for scheduled postoperative follow-up visit.  He is a 76 year old gentleman who underwent coronary bypass grafting 4 on 09/25/2016 for severe three-vessel coronary disease with progressive angina. His postoperative course was, complicated by atrial fibrillation, but he did convert to sinus rhythm with amiodarone prior to discharge. He was discharged on apixaban.  Since discharge she's been doing well. He denies any recurrent angina or shortness of breath. He has noticed that his heart rate is relatively slow. He also complains of discomfort when his shirt rubs on his chest. He is using only Tylenol for pain. He has been walking about 30 minutes a day and is anxious to increase his activities.  Past Medical History:  Diagnosis Date  . Coronary artery disease   . Diabetic peripheral neuropathy ([place])   . GERD (gastroesophageal reflux disease)   . High cholesterol   . History of gout    "controlled w/daily RX:" (09/26/2016)  . History of hiatal hernia    S/P OR  . Hypertension   . Type II diabetes mellitus ([place])      Current Outpatient Prescriptions  Medication Sig Dispense Refill  . ACCU-CHEK AVIVA PLUS test strip Check blood sugar once  daily 100 each 4  . allopurinol (ZYLOPRIM) 100 MG tablet TAKE 1 TABLET DAILY 90 tablet 4  . amiodarone (PACERONE) 200 MG tablet Take 1 tablet (200 mg total) by mouth daily. 90 tablet 3  . apixaban (ELIQUIS) 5 MG TABS tablet Take 1 tablet (5 mg total) by mouth 2 (two) times daily. 180 tablet 3  . aspirin 81 MG tablet Take 81 mg by mouth at bedtime.     Marland Kitchen atorvastatin (LIPITOR) 40 MG tablet Take 1 tablet (40 mg total) by mouth daily. (Patient taking differently: Take 40 mg by mouth at bedtime. ) 30 tablet 5  . Blood Glucose Monitoring Suppl (ACCU-CHEK AVIVA PLUS) w/Device KIT Use to check blood sugar daily as directed 1 kit 0  .  empagliflozin (JARDIANCE) 25 MG TABS tablet Take 25 mg by mouth daily. 30 tablet 6  . fluticasone (FLONASE) 50 MCG/ACT nasal spray Place 2 sprays into both nostrils daily. (Patient taking differently: Place 2 sprays into both nostrils daily as needed for allergies. ) 16 g 6  . gabapentin (NEURONTIN) 300 MG capsule TAKE ONE CAPSULE BY MOUTH AT BEDTIME FOR NEUROPATHY 90 capsule 4  . glipiZIDE (GLUCOTROL XL) 10 MG 24 hr tablet Take 1 tablet (10 mg total) by mouth daily with breakfast. 90 tablet 3  . insulin detemir (LEVEMIR) 100 UNIT/ML injection Inject 0.1 mLs (10 Units total) into the skin daily. Please dispense flex pen equivalent (Patient taking differently: Inject 20 Units into the skin daily. Please dispense flex pen equivalent) 10 mL 1  . metFORMIN (GLUCOPHAGE) 1000 MG tablet Take 1,000 mg by mouth 2 (two) times daily with a meal.    . metoprolol tartrate (LOPRESSOR) 25 MG tablet Take 0.5 tablets (12.5 mg total) by mouth 2 (two) times daily. 30 tablet 1  . nitroGLYCERIN (NITROSTAT) 0.4 MG SL tablet as needed.    . pioglitazone (ACTOS) 45 MG tablet Take 1 tablet (45 mg total) by mouth daily. 1 tablet 1   No current facility-administered medications for this visit.     Physical Exam BP 128/79   Pulse Marland Kitchen)  55   Resp 20   Ht 6' 4" (1.93 m)   Wt 206 lb (93.4 kg)   SpO2 94%   BMI 25.61 kg/m  76 year old man in no acute distress Alert and oriented 3 with no focal deficits Lungs clear with equal breath is bilaterally Cardiac bradycardic and regular, no rub or murmur Sternum stable, incision well-healed Leg incisions well healed, no peripheral edema  Diagnostic Tests: CHEST  2 VIEW  COMPARISON:  Chest x-ray of 10/08/2016  FINDINGS: Although the left pleural effusion is more obvious on the frontal view, on the lateral view it appears to have diminished in volume in the interval. The right lung remains clear. No pneumothorax is seen. Heart size is stable and median sternotomy sutures  are noted.  IMPRESSION: Decrease in size of small left pleural effusion with mild left basilar atelectasis.   Electronically Signed   By: Stuart Henry M.D.   On: 10/28/2016 10:02 I personally reviewed the chest x-ray and concur with the findings noted above  Impression: [name] is a 76 year old gentleman who had coronary bypass grafting about 5 weeks ago. He is doing well at the present time with excellent exercise tolerance and no recurrent angina. He does have some mild incisional discomfort, but is only requiring Tylenol.  He has been driving with appropriate precautions.  He had questions regarding his vascular studies preoperatively. He had 1-39% plaque in both carotids. His ABIs were elevated suggesting calcification of his peripheral vessels. He does not have any claudication.  I think he is find the start cardiac rehabilitation at any time.    Postoperative atrial fibrillation- in sinus bradycardia the day on Lopressor and low-dose amiodarone. He is also still on apixaban. I will defer to Dr. Saunders Henry on those medications.  Diabetes- blood sugars have been very well controlled since discharge. Will defer to Dr. Caryn Henry going forward.  Plan:  Follow-up with Dr. Caryn Henry and Dr. Saunders Henry. I will be happy to see him back at any time in the future if I can be of any further assistance with his care.  Stuart Nakayama, MD [facility] [phone]

## 2016-10-28 NOTE — Telephone Encounter (Signed)
Order for Cardiac Rehab already in system. Sent message to [employer] at [facility] Cardiac rehab to see if this order can still be used.  Notified patient that I am in the process of confirming rehab. He was very Patent attorney. I will touch base with him once I find out if new order is needed. Patient saw Dr Roxan Hockey today who advised it was ok to start Cardiac rehab at any time.

## 2016-10-28 NOTE — Telephone Encounter (Signed)
It is fine to reorder cardiac rehab at [facility] if the previous order is no longer valid. Thanks.

## 2016-10-30 ENCOUNTER — Other Ambulatory Visit: Payer: Self-pay | Admitting: Family Medicine

## 2016-11-06 ENCOUNTER — Other Ambulatory Visit: Payer: Self-pay | Admitting: Family Medicine

## 2016-11-06 DIAGNOSIS — E118 Type 2 diabetes mellitus with unspecified complications: Principal | ICD-10-CM

## 2016-11-06 DIAGNOSIS — IMO0002 Reserved for concepts with insufficient information to code with codable children: Secondary | ICD-10-CM

## 2016-11-06 DIAGNOSIS — E1165 Type 2 diabetes mellitus with hyperglycemia: Secondary | ICD-10-CM

## 2016-11-10 ENCOUNTER — Encounter: Payer: Medicare Other | Attending: Thoracic Surgery (Cardiothoracic Vascular Surgery) | Admitting: *Deleted

## 2016-11-10 ENCOUNTER — Ambulatory Visit: Payer: Medicare Other | Admitting: Family Medicine

## 2016-11-10 VITALS — Ht 76.2 in | Wt 205.0 lb

## 2016-11-10 DIAGNOSIS — Z951 Presence of aortocoronary bypass graft: Secondary | ICD-10-CM | POA: Diagnosis not present

## 2016-11-10 NOTE — Progress Notes (Signed)
Cardiac Individual Treatment Plan  Patient Details  Name: Stuart Henry MRN: [id number] Date of Birth: 1940-10-11 Referring Provider:     Cardiac Rehab from 11/10/2016 in [facility] Cardiac and Pulmonary Rehab  Referring Provider  End, Harrell Gave MD      Initial Encounter Date:    Cardiac Rehab from 11/10/2016 in [facility] Cardiac and Pulmonary Rehab  Date  11/10/16  Referring Provider  End, Harrell Gave MD      Visit Diagnosis: S/P CABG x 4  Patient's Home Medications on Admission:  Current Outpatient Prescriptions:  .  ACCU-CHEK AVIVA PLUS test strip, Check blood sugar once  daily, Disp: 100 each, Rfl: 4 .  allopurinol (ZYLOPRIM) 100 MG tablet, TAKE 1 TABLET DAILY, Disp: 90 tablet, Rfl: 4 .  amiodarone (PACERONE) 200 MG tablet, Take 1 tablet (200 mg total) by mouth daily., Disp: 90 tablet, Rfl: 3 .  apixaban (ELIQUIS) 5 MG TABS tablet, Take 1 tablet (5 mg total) by mouth 2 (two) times daily., Disp: 180 tablet, Rfl: 3 .  aspirin 81 MG tablet, Take 81 mg by mouth at bedtime. , Disp: , Rfl:  .  atorvastatin (LIPITOR) 40 MG tablet, Take 1 tablet (40 mg total) by mouth daily. (Patient taking differently: Take 40 mg by mouth at bedtime. ), Disp: 30 tablet, Rfl: 5 .  Blood Glucose Monitoring Suppl (ACCU-CHEK AVIVA PLUS) w/Device KIT, Use to check blood sugar daily as directed, Disp: 1 kit, Rfl: 0 .  empagliflozin (JARDIANCE) 25 MG TABS tablet, Take 25 mg by mouth daily., Disp: 30 tablet, Rfl: 6 .  gabapentin (NEURONTIN) 300 MG capsule, TAKE ONE CAPSULE BY MOUTH AT BEDTIME FOR NEUROPATHY, Disp: 90 capsule, Rfl: 4 .  glipiZIDE (GLUCOTROL XL) 10 MG 24 hr tablet, TAKE 1 TABLET DAILY WITH BREAKFAST, Disp: 90 tablet, Rfl: 3 .  insulin detemir (LEVEMIR) 100 UNIT/ML injection, Inject 0.1 mLs (10 Units total) into the skin daily. Please dispense flex pen equivalent (Patient taking differently: Inject 20 Units into the skin daily. Please dispense flex pen equivalent), Disp: 10 mL, Rfl: 1 .  metFORMIN  (GLUCOPHAGE) 1000 MG tablet, Take 1,000 mg by mouth 2 (two) times daily with a meal., Disp: , Rfl:  .  metoprolol tartrate (LOPRESSOR) 25 MG tablet, Take 0.5 tablets (12.5 mg total) by mouth 2 (two) times daily., Disp: 30 tablet, Rfl: 1 .  nitroGLYCERIN (NITROSTAT) 0.4 MG SL tablet, as needed., Disp: , Rfl:  .  pioglitazone (ACTOS) 45 MG tablet, Take 1 tablet (45 mg total) by mouth daily., Disp: 1 tablet, Rfl: 1 .  fluticasone (FLONASE) 50 MCG/ACT nasal spray, Place 2 sprays into both nostrils daily. (Patient taking differently: Place 2 sprays into both nostrils daily as needed for allergies. ), Disp: 16 g, Rfl: 6  Past Medical History: Past Medical History:  Diagnosis Date  . Coronary artery disease   . Diabetic peripheral neuropathy ([place])   . GERD (gastroesophageal reflux disease)   . High cholesterol   . History of gout    "controlled w/daily RX:" (09/26/2016)  . History of hiatal hernia    S/P OR  . Hypertension   . Type II diabetes mellitus ([facility])     Tobacco Use: History  Smoking Status  . Former Smoker  . Packs/day: 1.00  . Years: 5.00  . Types: Cigarettes  . Quit date: 08/06/1972  Smokeless Tobacco  . Never Used    Labs: Recent Review Flowsheet Data    Labs for ITP Cardiac and Pulmonary Rehab Latest Ref Rng &  Units 09/24/2016 09/24/2016 09/24/2016 09/24/2016 09/25/2016   Cholestrol 100 - 199 mg/dL - - - - -   LDLCALC 0 - 99 mg/dL - - - - -   HDL >39 mg/dL - - - - -   Trlycerides 0 - 149 mg/dL - - - - -   Hemoglobin A1c 4.8 - 5.6 % - - - - -   PHART 7.350 - 7.450 7.429 7.320(L) 7.345(L) - -   PCO2ART 32.0 - 48.0 mmHg 33.6 43.1 40.8 - -   HCO3 20.0 - 28.0 mmol/L 22.5 22.0 22.0 - -   TCO2 0 - 100 mmol/L _0 ACIDBASEDEF 0.0 - 2.0 mmol/L 2.0 4.0(H) 3.0(H) - -   O2SAT % 98.0 98.0 96.0 - -       Exercise Target Goals: Date: 11/10/16  Exercise Program Goal: Individual exercise prescription set with THRR, safety & activity barriers. Participant demonstrates  ability to understand and report RPE using BORG scale, to self-measure pulse accurately, and to acknowledge the importance of the exercise prescription.  Exercise Prescription Goal: Starting with aerobic activity 30 plus minutes a day, 3 days per week for initial exercise prescription. Provide home exercise prescription and guidelines that participant acknowledges understanding prior to discharge.  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 HR 118 bpm     Max Ex. BP 132/70     2 Minute Post BP 124/70        Oxygen Initial Assessment:   Oxygen Re-Evaluation:   Oxygen Discharge (Final Oxygen Re-Evaluation):   Initial Exercise Prescription:     Initial Exercise Prescription - 11/10/16 1400      Date of Initial Exercise RX and Referring Provider   Date 11/10/16   Referring Provider End, Harrell Gave MD     Treadmill   MPH 2.6   Grade 1   Minutes 15   METs 3.35     Elliptical   Level 1   Speed 3.1   Minutes 15     T5 Nustep   Level 2   SPM 80   Minutes 15   METs 2.5     Prescription Details   Frequency (times per week) 3   Duration Progress to 45 minutes of aerobic exercise without signs/symptoms of physical distress     Intensity   THRR 40-80% of Max Heartrate 94-129   Ratings of Perceived Exertion 11-13   Perceived Dyspnea 0-4     Progression   Progression Continue to progress workloads  to maintain intensity without signs/symptoms of physical distress.     Resistance Training  Training Prescription Yes   Weight 3 lbs   Reps 10-15      Perform Capillary Blood Glucose checks as needed.  Exercise Prescription Changes:     Exercise Prescription Changes    Row Name 11/10/16 1400             Response to Exercise   Blood Pressure (Admit) 126/60       Blood Pressure (Exercise) 132/70       Blood Pressure (Exit) 124/70       Heart Rate (Admit) 58 bpm       Heart Rate (Exercise) 118 bpm       Heart Rate (Exit) 53 bpm       Oxygen Saturation (Admit) 99 %       Oxygen Saturation (Exercise) 99 %       Rating of Perceived Exertion (Exercise) 8       Symptoms none       Comments walk test results          Exercise Comments:   Exercise Goals and Review:     Exercise Goals    Row Name 11/10/16 1445             Exercise Goals   Increase Physical Activity Yes       Intervention Provide advice, education, support and counseling about physical activity/exercise needs.;Develop an individualized exercise prescription for aerobic and resistive training based on initial evaluation findings, risk stratification, comorbidities and participant's personal goals.       Expected Outcomes Achievement of increased cardiorespiratory fitness and enhanced flexibility, muscular endurance and strength shown through measurements of functional capacity and personal statement of participant.       Increase Strength and Stamina Yes       Intervention Provide advice, education, support and counseling about physical activity/exercise needs.;Develop an individualized exercise prescription for aerobic and resistive training based on initial evaluation findings, risk stratification, comorbidities and participant's personal goals.       Expected Outcomes Achievement of increased cardiorespiratory fitness and enhanced flexibility, muscular endurance and strength shown through measurements of  functional capacity and personal statement of participant.          Exercise Goals Re-Evaluation :   Discharge Exercise Prescription (Final Exercise Prescription Changes):     Exercise Prescription Changes - 11/10/16 1400      Response to Exercise   Blood Pressure (Admit) 126/60   Blood Pressure (Exercise) 132/70   Blood Pressure (Exit) 124/70   Heart Rate (Admit) 58 bpm   Heart Rate (Exercise) 118 bpm   Heart Rate (Exit) 53 bpm   Oxygen Saturation (Admit) 99 %   Oxygen Saturation (Exercise) 99 %   Rating of Perceived Exertion (Exercise) 8   Symptoms none   Comments walk test results      Nutrition:  Target Goals: Understanding of nutrition guidelines, daily intake of sodium <1544m, cholesterol <2052m calories 30% from fat and 7% or less from saturated fats, daily to have 5 or more servings of fruits and vegetables.  Biometrics:     Pre Biometrics - 11/10/16 1445      Pre Biometrics   Height 6' 4.2" (1.935 m)   Weight 205 lb (93 kg)   Waist Circumference 27 inches   Hip Circumference 39.5 inches   Waist to Hip Ratio 0.68 %   BMI (Calculated) 24.9   Single Leg Stand 8.73 seconds       Nutrition Therapy Plan and Nutrition Goals:  Nutrition Therapy & Goals - 11/10/16 1424      Intervention Plan   Intervention Prescribe, educate and counsel regarding individualized specific dietary modifications aiming towards targeted core components such as weight, hypertension, lipid management, diabetes, heart failure and other comorbidities.   Expected Outcomes Short Term Goal: Understand basic principles of dietary content, such as calories, fat, sodium, cholesterol and nutrients.;Short Term Goal: A plan has been developed with personal nutrition goals set during dietitian appointment.;Long Term Goal: Adherence to prescribed nutrition plan.      Nutrition Discharge: Rate Your Plate Scores:     Nutrition Assessments - 11/10/16 1424      MEDFICTS Scores   Pre Score  10      Nutrition Goals Re-Evaluation:   Nutrition Goals Discharge (Final Nutrition Goals Re-Evaluation):   Psychosocial: Target Goals: Acknowledge presence or absence of significant depression and/or stress, maximize coping skills, provide positive support system. Participant is able to verbalize types and ability to use techniques and skills needed for reducing stress and depression.   Initial Review & Psychosocial Screening:     Initial Psych Review & Screening - 11/10/16 1425      Initial Review   Current issues with None Identified     Family Dynamics   Good Support System? Yes  Wife and friends     Barriers   Psychosocial barriers to participate in program There are no identifiable barriers or psychosocial needs.;The patient should benefit from training in stress management and relaxation.     Screening Interventions   Interventions Encouraged to exercise      Quality of Life Scores:      Quality of Life - 11/10/16 1426      Quality of Life Scores   Health/Function Pre 25.6 %   Socioeconomic Pre 26.29 %   Psych/Spiritual Pre 26.36 %   Family Pre 28.5 %   GLOBAL Pre 26.26 %      PHQ-9: Recent Review Flowsheet Data    Depression screen [facility] 2/9 11/10/2016 05/13/2016 02/23/2015   Decreased Interest 0 0 0   Down, Depressed, Hopeless 0 0 0   PHQ - 2 Score 0 0 0   Altered sleeping 0 - -   Tired, decreased energy 1 - -   Change in appetite 0 - -   Feeling bad or failure about yourself  0 - -   Trouble concentrating 0 - -   Moving slowly or fidgety/restless 0 - -   Suicidal thoughts 0 - -   PHQ-9 Score 1 - -     Interpretation of Total Score  Total Score Depression Severity:  1-4 = Minimal depression, 5-9 = Mild depression, 10-14 = Moderate depression, 15-19 = Moderately severe depression, 20-27 = Severe depression   Psychosocial Evaluation and Intervention:   Psychosocial Re-Evaluation:   Psychosocial Discharge (Final Psychosocial  Re-Evaluation):   Vocational Rehabilitation: Provide vocational rehab assistance to qualifying candidates.   Vocational Rehab Evaluation & Intervention:     Vocational Rehab - 11/10/16 1428      Initial Vocational Rehab Evaluation & Intervention   Assessment shows need for Vocational Rehabilitation No      Education: Education Goals: Education classes will be provided on a weekly basis, covering required topics. Participant will state understanding/return demonstration of topics presented.  Learning Barriers/Preferences:     Learning Barriers/Preferences - 11/10/16 1428      Learning Barriers/Preferences   Learning Barriers None   Learning Preferences None      Education Topics:  General Nutrition Guidelines/Fats and Fiber: -Group instruction provided by verbal, written material, models and posters to present the general guidelines for heart healthy nutrition. Gives an explanation and review of dietary fats and fiber.   Controlling Sodium/Reading Food Labels: -Group verbal and written material supporting the discussion of sodium use in heart healthy nutrition. Review and explanation with models, verbal and written materials for utilization of the food label.   Exercise Physiology & Risk Factors: - Group verbal and written instruction with models to review the exercise physiology of the cardiovascular system and associated critical values. Details cardiovascular disease risk factors and the goals associated with each risk factor.   Aerobic Exercise & Resistance Training: - Gives group verbal and written discussion on the health impact of inactivity. On the components of aerobic and resistive training programs and the benefits of this training and how to safely progress through these programs.   Flexibility, Balance, General Exercise Guidelines: - Provides group verbal and written instruction on the benefits of flexibility and balance training programs. Provides general  exercise guidelines with specific guidelines to those with heart or lung disease. Demonstration and skill practice provided.   Stress Management: - Provides group verbal and written instruction about the health risks of elevated stress, cause of high stress, and healthy ways to reduce stress.   Depression: - Provides group verbal and written instruction on the correlation between heart/lung disease and depressed mood, treatment options, and the stigmas associated with seeking treatment.   Anatomy & Physiology of the Heart: - Group verbal and written instruction and models provide basic cardiac anatomy and physiology, with the coronary electrical and arterial systems. Review of: AMI, Angina, Valve disease, Heart Failure, Cardiac Arrhythmia, Pacemakers, and the ICD.   Cardiac Procedures: - Group verbal and written instruction and models to describe the testing methods done to diagnose heart disease. Reviews the outcomes of the test results. Describes the treatment choices: Medical Management, Angioplasty, or Coronary Bypass Surgery.   Cardiac Medications: - Group verbal and written instruction to review commonly prescribed medications for heart disease. Reviews the medication, class of the drug, and side effects. Includes the steps to properly store meds and maintain the prescription regimen.   Go Sex-Intimacy & Heart Disease, Get SMART - Goal Setting: - Group verbal and written instruction through game format to discuss heart disease and the return to sexual intimacy. Provides group verbal and written material to discuss and apply goal setting through the application of the S.M.A.R.T. Method.   Other Matters of the Heart: - Provides group verbal, written materials and models to describe Heart Failure, Angina, Valve Disease, and Diabetes in the realm of heart disease. Includes description of the disease process and treatment options available to the cardiac patient.   Exercise &  Equipment Safety: - Individual verbal instruction and demonstration of equipment use and safety with use of the equipment.   Cardiac Rehab from 11/10/2016 in [facility] Cardiac and Pulmonary Rehab  Date  11/10/16  Educator  Sb  Instruction Review Code  2- meets goals/outcomes      Infection Prevention: - Provides verbal and written material to individual with discussion of infection control including proper hand washing and proper equipment cleaning during exercise session.   Cardiac Rehab from 11/10/2016 in [facility] Cardiac and Pulmonary Rehab  Date  11/10/16  Educator  SB  Instruction Review Code  2- meets goals/outcomes      Falls Prevention: - Provides verbal and written material to individual with discussion of falls prevention  and safety.   Cardiac Rehab from 11/10/2016 in [facility] Cardiac and Pulmonary Rehab  Date  11/10/16  Educator  SB  Instruction Review Code  2- meets goals/outcomes      Diabetes: - Individual verbal and written instruction to review signs/symptoms of diabetes, desired ranges of glucose level fasting, after meals and with exercise. Advice that pre and post exercise glucose checks will be done for 3 sessions at entry of program.   Cardiac Rehab from 11/10/2016 in [facility] Cardiac and Pulmonary Rehab  Date  11/10/16  Educator  SB  Instruction Review Code  2- meets goals/outcomes       Knowledge Questionnaire Score:     Knowledge Questionnaire Score - 11/10/16 1428      Knowledge Questionnaire Score   Pre Score 22/28      Core Components/Risk Factors/Patient Goals at Admission:     Personal Goals and Risk Factors at Admission - 11/10/16 1428      Core Components/Risk Factors/Patient Goals on Admission    Weight Management Yes;Weight Loss   Intervention Weight Management: Develop a combined nutrition and exercise program designed to reach desired caloric intake, while maintaining appropriate intake of nutrient and fiber, sodium and fats, and appropriate energy  expenditure required for the weight goal.;Weight Management: Provide education and appropriate resources to help participant work on and attain dietary goals.   Admit Weight 205 lb (93 kg)   Goal Weight: Short Term 203 lb (92.1 kg)   Goal Weight: Long Term 200 lb (90.7 kg)   Expected Outcomes Long Term: Adherence to nutrition and physical activity/exercise program aimed toward attainment of established weight goal;Short Term: Continue to assess and modify interventions until short term weight is achieved;Weight Maintenance: Understanding of the daily nutrition guidelines, which includes 25-35% calories from fat, 7% or less cal from saturated fats, less than 215m cholesterol, less than 1.5gm of sodium, & 5 or more servings of fruits and vegetables daily   Diabetes Yes  LAst A1C over 9%, has changed nutriton habits significantly since. FBA are 80s now were over 110   Intervention Provide education about signs/symptoms and action to take for hypo/hyperglycemia.;Provide education about proper nutrition, including hydration, and aerobic/resistive exercise prescription along with prescribed medications to achieve blood glucose in normal ranges: Fasting glucose 65-99 mg/dL   Expected Outcomes Short Term: Participant verbalizes understanding of the signs/symptoms and immediate care of hyper/hypoglycemia, proper foot care and importance of medication, aerobic/resistive exercise and nutrition plan for blood glucose control.;Long Term: Attainment of HbA1C < 7%.   Hypertension Yes   Intervention Provide education on lifestyle modifcations including regular physical activity/exercise, weight management, moderate sodium restriction and increased consumption of fresh fruit, vegetables, and low fat dairy, alcohol moderation, and smoking cessation.;Monitor prescription use compliance.   Expected Outcomes Short Term: Continued assessment and intervention until BP is < 140/921mHG in hypertensive participants. < 130/8071mHG in hypertensive participants with diabetes, heart failure or chronic kidney disease.;Long Term: Maintenance of blood pressure at goal levels.   Lipids Yes   Intervention Provide education and support for participant on nutrition & aerobic/resistive exercise along with prescribed medications to achieve LDL <41m64mDL >40mg60mExpected Outcomes Short Term: Participant states understanding of desired cholesterol values and is compliant with medications prescribed. Participant is following exercise prescription and nutrition guidelines.;Long Term: Cholesterol controlled with medications as prescribed, with individualized exercise RX and with personalized nutrition plan. Value goals: LDL < 41mg,1m > 40 mg.      Core  Components/Risk Factors/Patient Goals Review:    Core Components/Risk Factors/Patient Goals at Discharge (Final Review):    ITP Comments:     ITP Comments    Row Name 11/10/16 1413           ITP Comments Medical review completed today. Initial ITP created.  Documentation of diagnossi can be found [facility] Encounter 09/22/2016          Comments: Initial ITP

## 2016-11-10 NOTE — Patient Instructions (Signed)
Patient Instructions  Patient Details  Name: Stuart Henry MRN: [id number] Date of Birth: 26-Aug-1940 Referring Provider:  Melrose Nakayama, *  Below are the personal goals you chose as well as exercise and nutrition goals. Our goal is to help you keep on track towards obtaining and maintaining your goals. We will be discussing your progress on these goals with you throughout the program.  Initial Exercise Prescription:     Initial Exercise Prescription - 11/10/16 1400      Date of Initial Exercise RX and Referring Provider   Date 11/10/16   Referring Provider End, Harrell Gave MD     Treadmill   MPH 2.6   Grade 1   Minutes 15   METs 3.35     Elliptical   Level 1   Speed 3.1   Minutes 15     T5 Nustep   Level 2   SPM 80   Minutes 15   METs 2.5     Prescription Details   Frequency (times per week) 3   Duration Progress to 45 minutes of aerobic exercise without signs/symptoms of physical distress     Intensity   THRR 40-80% of Max Heartrate 94-129   Ratings of Perceived Exertion 11-13   Perceived Dyspnea 0-4     Progression   Progression Continue to progress workloads to maintain intensity without signs/symptoms of physical distress.     Resistance Training   Training Prescription Yes   Weight 3 lbs   Reps 10-15      Exercise Goals: Frequency: Be able to perform aerobic exercise three times per week working toward 3-5 days per week.  Intensity: Work with a perceived exertion of 11 (fairly light) - 15 (hard) as tolerated. Follow your new exercise prescription and watch for changes in prescription as you progress with the program. Changes will be reviewed with you when they are made.  Duration: You should be able to do 30 minutes of continuous aerobic exercise in addition to a 5 minute warm-up and a 5 minute cool-down routine.  Nutrition Goals: Your personal nutrition goals will be established when you do your nutrition analysis with the  dietician.  The following are nutrition guidelines to follow: Cholesterol < 200mg /day Sodium < 1500mg /day Fiber: Men over 50 yrs - 30 grams per day  Personal Goals:     Personal Goals and Risk Factors at Admission - 11/10/16 1428      Core Components/Risk Factors/Patient Goals on Admission    Weight Management Yes;Weight Loss   Intervention Weight Management: Develop a combined nutrition and exercise program designed to reach desired caloric intake, while maintaining appropriate intake of nutrient and fiber, sodium and fats, and appropriate energy expenditure required for the weight goal.;Weight Management: Provide education and appropriate resources to help participant work on and attain dietary goals.   Admit Weight 205 lb (93 kg)   Goal Weight: Short Term 203 lb (92.1 kg)   Goal Weight: Long Term 200 lb (90.7 kg)   Expected Outcomes Long Term: Adherence to nutrition and physical activity/exercise program aimed toward attainment of established weight goal;Short Term: Continue to assess and modify interventions until short term weight is achieved;Weight Maintenance: Understanding of the daily nutrition guidelines, which includes 25-35% calories from fat, 7% or less cal from saturated fats, less than 200mg  cholesterol, less than 1.5gm of sodium, & 5 or more servings of fruits and vegetables daily   Diabetes Yes  LAst A1C over 9%, has changed nutriton habits significantly since. FBA  are 80s now were over 110   Intervention Provide education about signs/symptoms and action to take for hypo/hyperglycemia.;Provide education about proper nutrition, including hydration, and aerobic/resistive exercise prescription along with prescribed medications to achieve blood glucose in normal ranges: Fasting glucose 65-99 mg/dL   Expected Outcomes Short Term: Participant verbalizes understanding of the signs/symptoms and immediate care of hyper/hypoglycemia, proper foot care and importance of medication,  aerobic/resistive exercise and nutrition plan for blood glucose control.;Long Term: Attainment of HbA1C < 7%.   Hypertension Yes   Intervention Provide education on lifestyle modifcations including regular physical activity/exercise, weight management, moderate sodium restriction and increased consumption of fresh fruit, vegetables, and low fat dairy, alcohol moderation, and smoking cessation.;Monitor prescription use compliance.   Expected Outcomes Short Term: Continued assessment and intervention until BP is < 140/17mm HG in hypertensive participants. < 130/67mm HG in hypertensive participants with diabetes, heart failure or chronic kidney disease.;Long Term: Maintenance of blood pressure at goal levels.   Lipids Yes   Intervention Provide education and support for participant on nutrition & aerobic/resistive exercise along with prescribed medications to achieve LDL 70mg , HDL >40mg .   Expected Outcomes Short Term: Participant states understanding of desired cholesterol values and is compliant with medications prescribed. Participant is following exercise prescription and nutrition guidelines.;Long Term: Cholesterol controlled with medications as prescribed, with individualized exercise RX and with personalized nutrition plan. Value goals: LDL < 70mg , HDL > 40 mg.      Tobacco Use Initial Evaluation: History  Smoking Status  . Former Smoker  . Packs/day: 1.00  . Years: 5.00  . Types: Cigarettes  . Quit date: 08/06/1972  Smokeless Tobacco  . Never Used    Copy of goals given to participant.

## 2016-11-12 DIAGNOSIS — Z951 Presence of aortocoronary bypass graft: Secondary | ICD-10-CM | POA: Diagnosis not present

## 2016-11-12 LAB — GLUCOSE, CAPILLARY
GLUCOSE-CAPILLARY: 146 mg/dL — AB (ref 65–99)
GLUCOSE-CAPILLARY: 108 mg/dL — AB (ref 65–99)

## 2016-11-12 NOTE — Progress Notes (Signed)
Daily Session Note  Patient Details  Name: ZIARE CRYDER MRN: [id number] Date of Birth: 21-Apr-1941 Referring Provider:     Cardiac Rehab from 11/10/2016 in [facility] Cardiac and Pulmonary Rehab  Referring Provider  End, Harrell Gave MD      Encounter Date: 11/12/2016  Check In:     Session Check In - 11/12/16 0924      Check-In   Location [facility]-Cardiac & Pulmonary Rehab   Staff Present Heath Lark, RN, BSN, CCRP;Jessica Luan Pulling, MA, ACSM RCEP, Exercise Physiologist;Katiya Fike Oletta Darter, BA, ACSM CEP, Exercise Physiologist   Supervising physician immediately available to respond to emergencies See telemetry face sheet for immediately available ER MD   Medication changes reported     No   Fall or balance concerns reported    No   Warm-up and Cool-down Performed on first and last piece of equipment   Resistance Training Performed Yes   VAD Patient? No     Pain Assessment   Currently in Pain? No/denies         History  Smoking Status  . Former Smoker  . Packs/day: 1.00  . Years: 5.00  . Types: Cigarettes  . Quit date: 08/06/1972  Smokeless Tobacco  . Never Used    Goals Met:  Independence with exercise equipment Exercise tolerated well No report of cardiac concerns or symptoms Strength training completed today  Goals Unmet:  Not Applicable  Comments: First full day of exercise!  Patient was oriented to gym and equipment including functions, settings, policies, and procedures.  Patient's individual exercise prescription and treatment plan were reviewed.  All starting workloads were established based on the results of the 6 minute walk test done at initial orientation visit.  The plan for exercise progression was also introduced and progression will be customized based on patient's performance and goals.    Dr. Emily Filbert is Medical Director for [place] [name].

## 2016-11-13 ENCOUNTER — Telehealth: Payer: Self-pay | Admitting: Internal Medicine

## 2016-11-13 NOTE — Telephone Encounter (Signed)
Pt is calling stating he is not satisfied with the treatment with his physical therapy that we asked him to do  He is not sure why he is doing that, and not even sure if it worth his time He is frustrated, having some anxiety over this Please advise

## 2016-11-13 NOTE — Telephone Encounter (Signed)
Spoke with Stuart Henry at [place]. Discussed patient's concerns with her. She agreed to have one of the nurses call to speak with patient to hear his concerns and helps to answer his questions.

## 2016-11-13 NOTE — Telephone Encounter (Signed)
Returned call to patient. Patient frustrated and experiencing anxiety about what is the cardiac rehab for and what their goals are, and the value of it for him. He stated he's exercised all his life to prevent this very thing from happening to his heart and health. Patient would like the cardiac rehab to address the goals and what is expected to achieve. Patient irritated with the first day, after orientation, of the cardiac rehab program when he was not clear about what he was supposed to be doing in the program regarding the exercise. He also felt he could do more than what was expected at that time. Encouraged patient to ask them that very question about goals and expectations to Cardiac rehab so that he could have the knowledge that he needs to be successful with the cardiac rehab program. Advised him I would call cardiac rehab and discuss this with them so that we can all be on the same page and achieve the necessary goals for him to be successful.

## 2016-11-14 ENCOUNTER — Encounter: Payer: Self-pay | Admitting: Family Medicine

## 2016-11-14 ENCOUNTER — Encounter: Payer: Medicare Other | Admitting: *Deleted

## 2016-11-14 ENCOUNTER — Ambulatory Visit (INDEPENDENT_AMBULATORY_CARE_PROVIDER_SITE_OTHER): Payer: Medicare Other

## 2016-11-14 ENCOUNTER — Ambulatory Visit (INDEPENDENT_AMBULATORY_CARE_PROVIDER_SITE_OTHER): Payer: Medicare Other | Admitting: Family Medicine

## 2016-11-14 ENCOUNTER — Telehealth: Payer: Self-pay | Admitting: *Deleted

## 2016-11-14 VITALS — BP 110/70 | HR 64 | Temp 98.8°F | Ht 76.0 in | Wt 209.2 lb

## 2016-11-14 DIAGNOSIS — Z951 Presence of aortocoronary bypass graft: Secondary | ICD-10-CM | POA: Diagnosis not present

## 2016-11-14 DIAGNOSIS — K219 Gastro-esophageal reflux disease without esophagitis: Secondary | ICD-10-CM

## 2016-11-14 DIAGNOSIS — IMO0002 Reserved for concepts with insufficient information to code with codable children: Secondary | ICD-10-CM

## 2016-11-14 DIAGNOSIS — E1165 Type 2 diabetes mellitus with hyperglycemia: Secondary | ICD-10-CM | POA: Diagnosis not present

## 2016-11-14 DIAGNOSIS — E1140 Type 2 diabetes mellitus with diabetic neuropathy, unspecified: Secondary | ICD-10-CM | POA: Diagnosis not present

## 2016-11-14 DIAGNOSIS — I2510 Atherosclerotic heart disease of native coronary artery without angina pectoris: Secondary | ICD-10-CM | POA: Diagnosis not present

## 2016-11-14 DIAGNOSIS — Z0000 Encounter for general adult medical examination without abnormal findings: Secondary | ICD-10-CM | POA: Diagnosis not present

## 2016-11-14 LAB — POCT GLYCOSYLATED HEMOGLOBIN (HGB A1C)
Est. average glucose Bld gHb Est-mCnc: 134
HEMOGLOBIN A1C: 6.3

## 2016-11-14 MED ORDER — GLIPIZIDE ER 10 MG PO TB24: ORAL_TABLET | ORAL | 3 refills | Status: DC

## 2016-11-14 MED ORDER — EMPAGLIFLOZIN 25 MG PO TABS: 12.5000 mg | ORAL_TABLET | Freq: Every day | ORAL | 1 refills | Status: DC

## 2016-11-14 MED ORDER — EMPAGLIFLOZIN 25 MG PO TABS: 25.0000 mg | ORAL_TABLET | Freq: Every day | ORAL | 0 refills | Status: DC

## 2016-11-14 MED ORDER — EMPAGLIFLOZIN 25 MG PO TABS: 12.5000 mg | ORAL_TABLET | Freq: Every day | ORAL | 6 refills | Status: DC

## 2016-11-14 NOTE — Progress Notes (Addendum)
Daily Session Note  Patient Details  Name: MARISSA LOWREY MRN: [id number] Date of Birth: 1941-07-14 Referring Provider:     Cardiac Rehab from 11/10/2016 in [facility] Cardiac and Pulmonary Rehab  Referring Provider  End, Harrell Gave MD      Encounter Date: 11/14/2016  Check In:     Session Check In - 11/14/16 0845      Check-In   Location [facility]-Cardiac & Pulmonary Rehab   Staff Present Gerlene Burdock, RN, Levie Heritage, MA, ACSM RCEP, Exercise Physiologist;Other  Darel Hong RN BSN   Supervising physician immediately available to respond to emergencies See telemetry face sheet for immediately available ER MD   Medication changes reported     No   Fall or balance concerns reported    No   Tobacco Cessation No Change   Warm-up and Cool-down Performed as group-led instruction   Resistance Training Performed Yes   VAD Patient? No     Pain Assessment   Currently in Pain? No/denies   Multiple Pain Sites No         History  Smoking Status  . Former Smoker  . Packs/day: 1.00  . Years: 5.00  . Types: Cigarettes  . Quit date: 08/06/1972  Smokeless Tobacco  . Never Used    Goals Met:   Independence with exercise equipment Exercise tolerated well No report of cardiac concerns or symptoms Strength training completed today  Goals Unmet:  Not Applicable  Comments: Pt able to follow exercise prescription today without complaint.  Will continue to monitor for progression.  Mr. Knoop seemed more open to rehab today.  We tried to take our time and explain each step of our process today.  He rather enjoyed learning about the [employer] that Kayleen Memos presented today after questions from the group earlier this week.  We tried out the bike instead of the treadmill today and he said he got a good workout on  It. He also did the elliptical again today with good success despite not being his favorite piece of equipment.  After class I asked if things had gone better today and he said  they did.  Dr. Emily Filbert is Medical Director for [place] [name].

## 2016-11-14 NOTE — Telephone Encounter (Signed)
Called Mr. Ogan about his concerns with rehab.  Apologized for confusion from first day.  Discussed with him how rehab is a safety net to get him back to where he was before his surgery.  Told him that we like to montior his exercise response with his new medication before he goes back at it on his own.  Also allows him to test his limits in a sae environment.  We talked some about the education side of rehab as well to learn more about his disease process and what it means going forward.  We discussed his goal of getting back to the gym regularlly and getting back out on his bike once cleared and how we can work towards these goals in rehab.  He voiced understanding to where we were coming from.

## 2016-11-14 NOTE — Progress Notes (Signed)
Patient: Stuart Henry Male    DOB: 1941/03/22   76 y.o.   MRN: [id number] Visit Date: 11/14/2016  Today's Provider: [name], MD   Chief Complaint  Patient presents with  . Follow-up  . Diabetes   Subjective:    HPI   Diabetes Mellitus Type II, Follow-up:   Lab Results  Component Value Date   HGBA1C 6.3 11/14/2016   HGBA1C 9.3 (H) 09/23/2016   HGBA1C 9.5 08/25/2016   Last seen for diabetes 5 weeks ago.  Management since then includes; advised can skip insulin for now if fastings are <125. He states he has not had to take insulin since last visit and is very strict with cardiac diet.  He reports good compliance with treatment. He is not having side effects. none Current symptoms include none and have been unchanged. Home blood sugar records: fasting range: 88-98  Episodes of hypoglycemia? no   Current Insulin Regimen: levemir 20 units qd Most Recent Eye Exam: Dr. Bing Plume Sept 2017 Weight trend: stable Prior visit with dietician: no Current diet: well balanced Current exercise: walking  ---------------------------------------------------------------- Wt Readings from Last 3 Encounters:  11/14/16 209 lb 3.2 oz (94.9 kg)  11/10/16 205 lb (93 kg)  10/28/16 206 lb (93.4 kg)      Allergies  Allergen Reactions  . No Known Allergies      Current Outpatient Prescriptions:  .  amiodarone (PACERONE) 200 MG tablet, Take 1 tablet (200 mg total) by mouth daily., Disp: 90 tablet, Rfl: 3 .  apixaban (ELIQUIS) 5 MG TABS tablet, Take 1 tablet (5 mg total) by mouth 2 (two) times daily., Disp: 180 tablet, Rfl: 3 .  atorvastatin (LIPITOR) 40 MG tablet, Take 1 tablet (40 mg total) by mouth daily. (Patient taking differently: Take 40 mg by mouth at bedtime. ), Disp: 30 tablet, Rfl: 5 .  empagliflozin (JARDIANCE) 25 MG TABS tablet, Take 25 mg by mouth daily., Disp: 30 tablet, Rfl: 6 .  gabapentin (NEURONTIN) 300 MG capsule, TAKE ONE CAPSULE BY MOUTH AT BEDTIME FOR  NEUROPATHY, Disp: 90 capsule, Rfl: 4 .  glipiZIDE (GLUCOTROL XL) 10 MG 24 hr tablet, TAKE 1 TABLET DAILY WITH BREAKFAST, Disp: 90 tablet, Rfl: 3 .  metFORMIN (GLUCOPHAGE) 1000 MG tablet, Take 1,000 mg by mouth 2 (two) times daily with a meal., Disp: , Rfl:  .  metoprolol tartrate (LOPRESSOR) 25 MG tablet, Take 0.5 tablets (12.5 mg total) by mouth 2 (two) times daily., Disp: 30 tablet, Rfl: 1 .  pioglitazone (ACTOS) 45 MG tablet, Take 1 tablet (45 mg total) by mouth daily., Disp: 1 tablet, Rfl: 1 .  ACCU-CHEK AVIVA PLUS test strip, Check blood sugar once  daily, Disp: 100 each, Rfl: 4 .  allopurinol (ZYLOPRIM) 100 MG tablet, TAKE 1 TABLET DAILY, Disp: 90 tablet, Rfl: 4 .  Blood Glucose Monitoring Suppl (ACCU-CHEK AVIVA PLUS) w/Device KIT, Use to check blood sugar daily as directed, Disp: 1 kit, Rfl: 0 .  fluticasone (FLONASE) 50 MCG/ACT nasal spray, Place 2 sprays into both nostrils daily. (Patient taking differently: Place 2 sprays into both nostrils daily as needed for allergies. ), Disp: 16 g, Rfl: 6 .  insulin detemir (LEVEMIR) 100 UNIT/ML injection, Inject 0.1 mLs (10 Units total) into the skin daily. Please dispense flex pen equivalent (Patient not taking: Reported on 11/14/2016), Disp: 10 mL, Rfl: 1 .  nitroGLYCERIN (NITROSTAT) 0.4 MG SL tablet, as needed., Disp: , Rfl:   Review of Systems  Constitutional:  Negative for appetite change, chills and fever.  Respiratory: Negative for chest tightness, shortness of breath and wheezing.   Cardiovascular: Negative for chest pain and palpitations.  Gastrointestinal: Negative for abdominal pain, nausea and vomiting.    Social History  Substance Use Topics  . Smoking status: Former Smoker    Packs/day: 1.00    Years: 5.00    Types: Cigarettes    Quit date: 08/06/1972  . Smokeless tobacco: Never Used  . Alcohol use No     Comment: 09/26/2016 "quit 01/30/12"   Objective:    BP  110/70 (BP Location: Right Arm)     Pulse  64     Temp  98.8  F (37.1 C) (Oral)     Ht  _0  (1.93 m)     Wt  209 lb 3.2 oz (94.9 kg)      BMI  25.46 kg/m      Vitals History  BMI and BSA Data   Body Mass Index: 25.46 kg/m Body Surface Area: 2.26 m      Physical Exam   General Appearance:    Alert, cooperative, no distress  Eyes:    PERRL, conjunctiva/corneas clear, EOM's intact       Lungs:     Clear to auscultation bilaterally, respirations unlabored  Heart:    Regular rate and rhythm  Neurologic:   Awake, alert, oriented x 3. No apparent focal neurological           defect.       Results for orders placed or performed in visit on 11/14/16  POCT glycosylated hemoglobin (Hb A1C)  Result Value Ref Range   Hemoglobin A1C 6.3    Est. average glucose Bld gHb Est-mCnc 134        Assessment & Plan:     1. Type 2 diabetes mellitus with diabetic neuropathy, without long-term current use of insulin ([place]) Much better on current diet. Will reduce glipizide and Jardiance and recheck A1c in 3 months.  - POCT glycosylated hemoglobin (Hb A1C) - glipiZIDE (GLUCOTROL XL) 10 MG 24 hr tablet; 1/2 tablet daily  Dispense: 1 tablet; Refill: 3  2. Coronary artery disease involving native coronary artery of native heart without angina pectoris Continue Jardiance for cardiac benefits, but will reduce to 10-12.mg daily due to cost. Given 28 19m sample tablets today, but send prescription to mail order to take 1/2 of 257mdaily.  - empagliflozin (JARDIANCE) 25 MG TABS tablet; Take 12.5 mg by mouth daily.  Dispense: 1 tablet; Refill: 1     Return in about 3 months (around 02/13/2017).   DoLelon HuhMD  [facility]

## 2016-11-14 NOTE — Patient Instructions (Signed)
Take either 1/2 of 25mg  Jardiance every morning, or 1 FULL 10mg  Jardiance every morning.

## 2016-11-14 NOTE — Patient Instructions (Signed)
Stuart Henry , Thank you for taking time to come for your Medicare Wellness Visit. I appreciate your ongoing commitment to your health goals. Please review the following plan we discussed and let me know if I can assist you in the future.   Screening recommendations/referrals: Colonoscopy: last 02/09/15 Recommended yearly ophthalmology/optometry visit for glaucoma screening and checkup Recommended yearly dental visit for hygiene and checkup  Vaccinations: Influenza vaccine: done 05/02/16 Pneumococcal vaccine: completed series Tdap vaccine: last done 03/10/14 Shingles vaccine: done    Advanced directives: packet given today, requested copy once completed  Next appointment: None  Preventive Care 54 Years and Older, Male Preventive care refers to lifestyle choices and visits with your health care provider that can promote health and wellness. What does preventive care include?  A yearly physical exam. This is also called an annual well check.  Dental exams once or twice a year.  Routine eye exams. Ask your health care provider how often you should have your eyes checked.  Personal lifestyle choices, including:  Daily care of your teeth and gums.  Regular physical activity.  Eating a healthy diet.  Avoiding tobacco and drug use.  Limiting alcohol use.  Practicing safe sex.  Taking low doses of aspirin every day.  Taking vitamin and mineral supplements as recommended by your health care provider. What happens during an annual well check? The services and screenings done by your health care provider during your annual well check will depend on your age, overall health, lifestyle risk factors, and family history of disease. Counseling  Your health care provider may ask you questions about your:  Alcohol use.  Tobacco use.  Drug use.  Emotional well-being.  Home and relationship well-being.  Sexual activity.  Eating habits.  History of falls.  Memory and ability to  understand (cognition).  Work and work Statistician. Screening  You may have the following tests or measurements:  Height, weight, and BMI.  Blood pressure.  Lipid and cholesterol levels. These may be checked every 5 years, or more frequently if you are over 27 years old.  Skin check.  Lung cancer screening. You may have this screening every year starting at age 67 if you have a 30-pack-year history of smoking and currently smoke or have quit within the past 15 years.  Fecal occult blood test (FOBT) of the stool. You may have this test every year starting at age 91.  Flexible sigmoidoscopy or colonoscopy. You may have a sigmoidoscopy every 5 years or a colonoscopy every 10 years starting at age 18.  Prostate cancer screening. Recommendations will vary depending on your family history and other risks.  Hepatitis C blood test.  Hepatitis B blood test.  Sexually transmitted disease (STD) testing.  Diabetes screening. This is done by checking your blood sugar (glucose) after you have not eaten for a while (fasting). You may have this done every 1-3 years.  Abdominal aortic aneurysm (AAA) screening. You may need this if you are a current or former smoker.  Osteoporosis. You may be screened starting at age 25 if you are at high risk. Talk with your health care provider about your test results, treatment options, and if necessary, the need for more tests. Vaccines  Your health care provider may recommend certain vaccines, such as:  Influenza vaccine. This is recommended every year.  Tetanus, diphtheria, and acellular pertussis (Tdap, Td) vaccine. You may need a Td booster every 10 years.  Zoster vaccine. You may need this after age 28.  Pneumococcal 13-valent conjugate (PCV13) vaccine. One dose is recommended after age 53.  Pneumococcal polysaccharide (PPSV23) vaccine. One dose is recommended after age 73. Talk to your health care provider about which screenings and vaccines you  need and how often you need them. This information is not intended to replace advice given to you by your health care provider. Make sure you discuss any questions you have with your health care provider. Document Released: 08/17/2015 Document Revised: 04/09/2016 Document Reviewed: 05/22/2015 Elsevier Interactive Patient Education  2017 [place] Prevention in the Home Falls can cause injuries. They can happen to people of all ages. There are many things you can do to make your home safe and to help prevent falls. What can I do on the outside of my home?  Regularly fix the edges of walkways and driveways and fix any cracks.  Remove anything that might make you trip as you walk through a door, such as a raised step or threshold.  Trim any bushes or trees on the path to your home.  Use bright outdoor lighting.  Clear any walking paths of anything that might make someone trip, such as rocks or tools.  Regularly check to see if handrails are loose or broken. Make sure that both sides of any steps have handrails.  Any raised decks and porches should have guardrails on the edges.  Have any leaves, snow, or ice cleared regularly.  Use sand or salt on walking paths during winter.  Clean up any spills in your garage right away. This includes oil or grease spills. What can I do in the bathroom?  Use night lights.  Install grab bars by the toilet and in the tub and shower. Do not use towel bars as grab bars.  Use non-skid mats or decals in the tub or shower.  If you need to sit down in the shower, use a plastic, non-slip stool.  Keep the floor dry. Clean up any water that spills on the floor as soon as it happens.  Remove soap buildup in the tub or shower regularly.  Attach bath mats securely with double-sided non-slip rug tape.  Do not have throw rugs and other things on the floor that can make you trip. What can I do in the bedroom?  Use night lights.  Make sure  that you have a light by your bed that is easy to reach.  Do not use any sheets or blankets that are too big for your bed. They should not hang down onto the floor.  Have a firm chair that has side arms. You can use this for support while you get dressed.  Do not have throw rugs and other things on the floor that can make you trip. What can I do in the kitchen?  Clean up any spills right away.  Avoid walking on wet floors.  Keep items that you use a lot in easy-to-reach places.  If you need to reach something above you, use a strong step stool that has a grab bar.  Keep electrical cords out of the way.  Do not use floor polish or wax that makes floors slippery. If you must use wax, use non-skid floor wax.  Do not have throw rugs and other things on the floor that can make you trip. What can I do with my stairs?  Do not leave any items on the stairs.  Make sure that there are handrails on both sides of the stairs and use them.  Fix handrails that are broken or loose. Make sure that handrails are as long as the stairways.  Check any carpeting to make sure that it is firmly attached to the stairs. Fix any carpet that is loose or worn.  Avoid having throw rugs at the top or bottom of the stairs. If you do have throw rugs, attach them to the floor with carpet tape.  Make sure that you have a light switch at the top of the stairs and the bottom of the stairs. If you do not have them, ask someone to add them for you. What else can I do to help prevent falls?  Wear shoes that:  Do not have high heels.  Have rubber bottoms.  Are comfortable and fit you well.  Are closed at the toe. Do not wear sandals.  If you use a stepladder:  Make sure that it is fully opened. Do not climb a closed stepladder.  Make sure that both sides of the stepladder are locked into place.  Ask someone to hold it for you, if possible.  Clearly mark and make sure that you can see:  Any grab bars or  handrails.  First and last steps.  Where the edge of each step is.  Use tools that help you move around (mobility aids) if they are needed. These include:  Canes.  Walkers.  Scooters.  Crutches.  Turn on the lights when you go into a dark area. Replace any light bulbs as soon as they burn out.  Set up your furniture so you have a clear path. Avoid moving your furniture around.  If any of your floors are uneven, fix them.  If there are any pets around you, be aware of where they are.  Review your medicines with your doctor. Some medicines can make you feel dizzy. This can increase your chance of falling. Ask your doctor what other things that you can do to help prevent falls. This information is not intended to replace advice given to you by your health care provider. Make sure you discuss any questions you have with your health care provider. Document Released: 05/17/2009 Document Revised: 12/27/2015 Document Reviewed: 08/25/2014 Elsevier Interactive Patient Education  2017 [employer].

## 2016-11-14 NOTE — Progress Notes (Signed)
Subjective:   Stuart Henry is a 76 y.o. male who presents for Medicare Annual/Subsequent preventive examination.  Review of Systems:  N/A Cardiac Risk Factors include: advanced age (>39mn, >>52women);diabetes mellitus;dyslipidemia;hypertension;male gender     Objective:    Vitals: BP 110/70 (BP Location: Right Arm)   Pulse 64   Temp 98.8 F (37.1 C) (Oral)   Ht _0  (1.93 m)   Wt 209 lb 3.2 oz (94.9 kg)   BMI 25.46 kg/m   Body mass index is 25.46 kg/m.  Tobacco History  Smoking Status  . Former Smoker  . Packs/day: 1.00  . Years: 5.00  . Types: Cigarettes  . Quit date: 08/06/1972  Smokeless Tobacco  . Never Used     Counseling given: Not Answered   Past Medical History:  Diagnosis Date  . Coronary artery disease   . Diabetic peripheral neuropathy ([place]   . GERD (gastroesophageal reflux disease)   . High cholesterol   . History of gout    "controlled w/daily RX:" (09/26/2016)  . History of hiatal hernia    S/P OR  . Hypertension   . Type II diabetes mellitus ([place]    Past Surgical History:  Procedure Laterality Date  . ANTERIOR CERVICAL DECOMP/DISCECTOMY FUSION     "titanium disc replacemen; ?C3t"  . [place]    [place]  . CATARACT EXTRACTION W/ INTRAOCULAR LENS  IMPLANT, BILATERAL Bilateral   . COLONOSCOPY WITH PROPOFOL N/A 02/09/2015   Procedure: COLONOSCOPY WITH PROPOFOL;  Surgeon: RManya Silvas MD;  [facility]  Service: Endoscopy;  Laterality: N/A;  . CORONARY ARTERY BYPASS GRAFT N/A 09/24/2016   Procedure: CORONARY ARTERY BYPASS GRAFTING (CABG) x four, using left internal mammary artery and right leg greater saphenous vein harvested endoscopically;  Surgeon: SMelrose Nakayama MD;  [place]  Service: Open Heart Surgery;  Laterality: N/A;  . EXPLORATION POST OPERATIVE OPEN HEART    . HIATAL HERNIA REPAIR  ~ 2003   "laparoscopic"  . KNEE ARTHROSCOPY Left   . LEFT  HEART CATH AND CORONARY ANGIOGRAPHY Left 09/16/2016   Procedure: Left Heart Cath and Coronary Angiography;  Surgeon: CNelva Bush MD;  [place];  Service: Cardiovascular;  Laterality: Left;  . PATELLAR TENDON REPAIR Right   . TEE WITHOUT CARDIOVERSION N/A 09/24/2016   Procedure: TRANSESOPHAGEAL ECHOCARDIOGRAM (TEE);  Surgeon: SMelrose Nakayama MD;  [place]  Service: Open Heart Surgery;  Laterality: N/A;   Family History  Problem Relation Age of Onset  . Dementia Mother   . Diabetes Mother   . Cancer Father   . Diabetes Sister   . Diabetes Brother   . Heart disease Other    History  Sexual Activity  . Sexual activity: No    Outpatient Encounter Prescriptions as of 11/14/2016  Medication Sig  . ACCU-CHEK AVIVA PLUS test strip Check blood sugar once  daily  . allopurinol (ZYLOPRIM) 100 MG tablet TAKE 1 TABLET DAILY  . amiodarone (PACERONE) 200 MG tablet Take 1 tablet (200 mg total) by mouth daily.  .Marland Kitchenapixaban (ELIQUIS) 5 MG TABS tablet Take 1 tablet (5 mg total) by mouth 2 (two) times daily.  .Marland Kitchenaspirin 81 MG tablet Take 81 mg by mouth at bedtime.   .Marland Kitchenatorvastatin (LIPITOR) 40 MG tablet Take 1 tablet (40 mg total) by mouth daily. (Patient taking differently: Take 40 mg by mouth at bedtime. )  .  Blood Glucose Monitoring Suppl (ACCU-CHEK AVIVA PLUS) w/Device KIT Use to check blood sugar daily as directed  . empagliflozin (JARDIANCE) 25 MG TABS tablet Take 25 mg by mouth daily.  . fluticasone (FLONASE) 50 MCG/ACT nasal spray Place 2 sprays into both nostrils daily. (Patient taking differently: Place 2 sprays into both nostrils daily as needed for allergies. )  . gabapentin (NEURONTIN) 300 MG capsule TAKE ONE CAPSULE BY MOUTH AT BEDTIME FOR NEUROPATHY  . glipiZIDE (GLUCOTROL XL) 10 MG 24 hr tablet TAKE 1 TABLET DAILY WITH BREAKFAST  . insulin detemir (LEVEMIR) 100 UNIT/ML injection Inject 0.1 mLs (10 Units total) into the skin daily. Please dispense flex  pen equivalent (Patient taking differently: Inject 20 Units into the skin daily. Please dispense flex pen equivalent)  . metFORMIN (GLUCOPHAGE) 1000 MG tablet Take 1,000 mg by mouth 2 (two) times daily with a meal.  . metoprolol tartrate (LOPRESSOR) 25 MG tablet Take 0.5 tablets (12.5 mg total) by mouth 2 (two) times daily.  . nitroGLYCERIN (NITROSTAT) 0.4 MG SL tablet as needed.  . pioglitazone (ACTOS) 45 MG tablet Take 1 tablet (45 mg total) by mouth daily.   No facility-administered encounter medications on file as of 11/14/2016.     Activities of Daily Living In your present state of health, do you have any difficulty performing the following activities: 11/14/2016 09/26/2016  Hearing? N Y  Vision? N N  Difficulty concentrating or making decisions? N Y  Walking or climbing stairs? N N  Dressing or bathing? N N  Doing errands, shopping? N -  Preparing Food and eating ? N -  Using the Toilet? N -  In the past six months, have you accidently leaked urine? N -  Do you have problems with loss of bowel control? N -  Managing your Medications? N -  Managing your Finances? N -  Housekeeping or managing your Housekeeping? N -  Some recent data might be hidden    Patient Care Team: Birdie Sons, MD as PCP - General (Family Medicine) Manya Silvas, MD as Consulting Physician (Gastroenterology) Nelva Bush, MD as Consulting Physician (Cardiology) Calvert Cantor, MD as Consulting Physician (Ophthalmology) Melrose Nakayama, MD as Consulting Physician (Cardiothoracic Surgery)   Assessment:     Exercise Activities and Dietary recommendations Current Exercise Habits: Structured exercise class (cardiovascular rehab program), Type of exercise: stretching;treadmill;Other - see comments (eliptical, bicycles), Time (Minutes): > 60, Frequency (Times/Week): 3, Weekly Exercise (Minutes/Week): 0, Intensity: Mild, Exercise limited by: Other - see comments (open heart sx)  Goals    .  Decrease soda consumption          Recommend decreasing soda intake from 32 oz to 16 oz a day.      Fall Risk Fall Risk  11/14/2016 11/10/2016 05/13/2016 02/23/2015  Falls in the past year? No No No No   Depression Screen PHQ 2/9 Scores 11/14/2016 11/10/2016 05/13/2016 02/23/2015  PHQ - 2 Score 0 0 0 0  PHQ- 9 Score 3 1 - -    Cognitive Function     6CIT Screen 11/14/2016  What Year? 0 points  What month? 0 points  What time? 0 points  Count back from 20 0 points  Months in reverse 0 points  Repeat phrase 2 points  Total Score 2    Immunization History  Administered Date(s) Administered  . Influenza,inj,Quad PF,36+ Mos 05/02/2016  . Pneumococcal Conjugate-13 03/10/2014  . Pneumococcal Polysaccharide-23 08/31/2006, 03/02/2012  . Td 08/04/2002  . Tdap 03/10/2014  .  Zoster 06/16/2011   Screening Tests Health Maintenance  Topic Date Due  . FOOT EXAM  01/11/1953  . URINE MICROALBUMIN  11/29/2016  . INFLUENZA VACCINE  03/04/2017  . HEMOGLOBIN A1C  03/23/2017  . OPHTHALMOLOGY EXAM  05/01/2017  . COLONOSCOPY  02/09/2020  . TETANUS/TDAP  03/10/2024  . PNA vac Low Risk Adult  Completed      Plan:  I have personally reviewed and addressed the Medicare Annual Wellness questionnaire and have noted the following in the patient's chart:  A. Medical and social history B. Use of alcohol, tobacco or illicit drugs  C. Current medications and supplements D. Functional ability and status E.  Nutritional status F.  Physical activity G. Advance directives H. List of other physicians I.  Hospitalizations, surgeries, and ER visits in previous 12 months J.  [place] such as hearing and vision if needed, cognitive and depression L. Referrals and appointments - none  In addition, I have reviewed and discussed with patient certain preventive protocols, quality metrics, and best practice recommendations. A written personalized care plan for preventive services as well as  general preventive health recommendations were provided to patient.  See attached scanned questionnaire for additional information.   Signed,  Fabio Neighbors, LPN Nurse Health Advisor   MD Recommendations: Pt needs diabetic foot exam today.  I have reviewed the health advisor's note, was available for consultation, and agree with documentation and plan  [name], MD

## 2016-11-15 LAB — GLUCOSE, CAPILLARY
Glucose-Capillary: 123 mg/dL — ABNORMAL HIGH (ref 65–99)
Glucose-Capillary: 109 mg/dL — ABNORMAL HIGH (ref 65–99)

## 2016-11-17 ENCOUNTER — Encounter: Payer: Medicare Other | Admitting: *Deleted

## 2016-11-17 DIAGNOSIS — Z951 Presence of aortocoronary bypass graft: Secondary | ICD-10-CM

## 2016-11-17 LAB — GLUCOSE, CAPILLARY
GLUCOSE-CAPILLARY: 142 mg/dL — AB (ref 65–99)
GLUCOSE-CAPILLARY: 109 mg/dL — AB (ref 65–99)

## 2016-11-17 NOTE — Progress Notes (Signed)
Daily Session Note  Patient Details  Name: Stuart Henry MRN: [id number] Date of Birth: [birth year] Referring Provider:     Cardiac Rehab from 11/10/2016 in [facility] Cardiac and Pulmonary Rehab  Referring Provider  End, Harrell Gave MD      Encounter Date: 11/17/2016  Check In:     Session Check In - 11/17/16 0801      Check-In   Location [facility]-Cardiac & Pulmonary Rehab   Staff Present Alberteen Sam, MA, ACSM RCEP, Exercise Physiologist;Kelly Amedeo Plenty, BS, ACSM CEP, Exercise Physiologist;Carroll Enterkin, RN, BSN   Supervising physician immediately available to respond to emergencies See telemetry face sheet for immediately available ER MD   Medication changes reported     No   Fall or balance concerns reported    No   Warm-up and Cool-down Performed on first and last piece of equipment   Resistance Training Performed Yes   VAD Patient? No     Pain Assessment   Currently in Pain? No/denies   Multiple Pain Sites No         History  Smoking Status  . Former Smoker  . Packs/day: 1.00  . Years: 5.00  . Types: Cigarettes  . Quit date: 08/06/1972  Smokeless Tobacco  . Never Used    Goals Met:  Independence with exercise equipment Exercise tolerated well No report of cardiac concerns or symptoms Strength training completed today  Goals Unmet:  Not Applicable  Comments: Pt able to follow exercise prescription today without complaint.  Will continue to monitor for progression.    Dr. Emily Filbert is Medical Director for [place] and [name].

## 2016-11-19 ENCOUNTER — Other Ambulatory Visit: Payer: Self-pay | Admitting: Family Medicine

## 2016-11-19 DIAGNOSIS — Z951 Presence of aortocoronary bypass graft: Secondary | ICD-10-CM

## 2016-11-19 NOTE — Progress Notes (Signed)
Daily Session Note  Patient Details  Name: Stuart Henry MRN: [id number] Date of Birth: 1941/04/11 Referring Provider:     Cardiac Rehab from 11/10/2016 in [facility] Cardiac and Pulmonary Rehab  Referring Provider  End, Harrell Gave MD      Encounter Date: 11/19/2016  Check In:     Session Check In - 11/19/16 0826      Check-In   Location [facility]-Cardiac & Pulmonary Rehab   Staff Present Alberteen Sam, MA, ACSM RCEP, Exercise Physiologist;Susanne Bice, RN, BSN, Lance Sell, BA, ACSM CEP, Exercise Physiologist   Supervising physician immediately available to respond to emergencies See telemetry face sheet for immediately available ER MD   Medication changes reported     No   Fall or balance concerns reported    No   Warm-up and Cool-down Performed on first and last piece of equipment   Resistance Training Performed Yes   VAD Patient? No     Pain Assessment   Currently in Pain? No/denies         History  Smoking Status  . Former Smoker  . Packs/day: 1.00  . Years: 5.00  . Types: Cigarettes  . Quit date: 08/06/1972  Smokeless Tobacco  . Never Used    Goals Met:  Independence with exercise equipment Exercise tolerated well No report of cardiac concerns or symptoms Strength training completed today  Goals Unmet:  Not Applicable  Comments: Pt able to follow exercise prescription today without complaint.  Will continue to monitor for progression.    Dr. Emily Filbert is Medical Director for [place] and [name].

## 2016-11-19 NOTE — Telephone Encounter (Signed)
Patient came by office today and wanted to know if he needs to continue taking metoprolol 25 mg qd. If so patient stated that he needs a refill. Medication was originally prescribed by Izola Price. Please advise?

## 2016-11-19 NOTE — Telephone Encounter (Signed)
Yes, he needs to stay on it, please send prescription with 1 year rf to pharmacy of his choice.

## 2016-11-21 ENCOUNTER — Encounter: Payer: Medicare Other | Admitting: *Deleted

## 2016-11-21 DIAGNOSIS — Z951 Presence of aortocoronary bypass graft: Secondary | ICD-10-CM | POA: Diagnosis not present

## 2016-11-21 NOTE — Progress Notes (Signed)
Daily Session Note  Patient Details  Name: Stuart Henry MRN: [id number] Date of Birth: January 25, 1941 Referring Provider:     Cardiac Rehab from 11/10/2016 in [facility] Cardiac and Pulmonary Rehab  Referring Provider  End, Harrell Gave MD      Encounter Date: 11/21/2016  Check In:     Session Check In - 11/21/16 0832      Check-In   Location [facility]-Cardiac & Pulmonary Rehab   Staff Present Gerlene Burdock, RN, BSN;Susanne Bice, RN, BSN, CCRP;Jessica Luan Pulling, MA, ACSM RCEP, Exercise Physiologist   Supervising physician immediately available to respond to emergencies See telemetry face sheet for immediately available ER MD   Medication changes reported     No   Fall or balance concerns reported    No   Warm-up and Cool-down Performed on first and last piece of equipment   VAD Patient? No     Pain Assessment   Currently in Pain? No/denies           Exercise Prescription Changes - 11/20/16 1100      Response to Exercise   Blood Pressure (Admit) 108/56   Blood Pressure (Exercise) 152/76   Blood Pressure (Exit) 106/56   Heart Rate (Admit) 55 bpm   Heart Rate (Exercise) 91 bpm   Heart Rate (Exit) 70 bpm   Rating of Perceived Exertion (Exercise) 12   Symptoms none   Duration Continue with 45 min of aerobic exercise without signs/symptoms of physical distress.   Intensity THRR unchanged     Progression   Progression Continue to progress workloads to maintain intensity without signs/symptoms of physical distress.   Average METs 4.23     Resistance Training   Training Prescription Yes   Weight 3 lbs   Reps 10-15     Interval Training   Interval Training No     Bike   Level 1.5   Minutes 15   METs 3.86     Elliptical   Level 1   Speed 3.1   Minutes 15     T5 Nustep   Level 3   SPM 100   Minutes 15   METs 4.6      History  Smoking Status  . Former Smoker  . Packs/day: 1.00  . Years: 5.00  . Types: Cigarettes  . Quit date: 08/06/1972  Smokeless Tobacco  .  Never Used    Goals Met:  Proper associated with RPD/PD & O2 Sat Exercise tolerated well  Goals Unmet:  Not Applicable  Comments:     Dr. Emily Filbert is Medical Director for [place] and [name].

## 2016-11-24 ENCOUNTER — Encounter: Payer: Medicare Other | Admitting: *Deleted

## 2016-11-24 DIAGNOSIS — Z951 Presence of aortocoronary bypass graft: Secondary | ICD-10-CM

## 2016-11-24 NOTE — Progress Notes (Signed)
Daily Session Note  Patient Details  Name: Stuart Henry MRN: [id number] Date of Birth: 08-May-1941 Referring Provider:     Cardiac Rehab from 11/10/2016 in [facility]  Referring Provider  End, Harrell Gave MD      Encounter Date: 11/24/2016  Check In:     Session Check In - 11/24/16 0743      Check-In   Location [facility]   Staff Present Gerlene Burdock, RN, Moises Blood, BS, ACSM CEP, Exercise Physiologist;Jessica Luan Pulling, [place], ACSM RCEP, Exercise Physiologist   Supervising physician immediately available to respond to emergencies See telemetry face sheet for immediately available ER MD   Medication changes reported     No   Fall or balance concerns reported    No   Warm-up and Cool-down Performed on first and last piece of equipment   Resistance Training Performed Yes   VAD Patient? No     Pain Assessment   Currently in Pain? No/denies   Multiple Pain Sites No           Exercise Prescription Changes - 11/24/16 0900      Response to Exercise   Symptoms none   Duration Continue with 45 min of aerobic exercise without signs/symptoms of physical distress.   Intensity THRR unchanged     Progression   Progression Continue to progress workloads to maintain intensity without signs/symptoms of physical distress.   Average METs 4.23     Resistance Training   Training Prescription Yes   Weight 3 lbs   Reps 10-15     Interval Training   Interval Training No     Bike   Level 1.5   Minutes 15   METs 3.86     Elliptical   Level 1   Speed 3.1   Minutes 15     T5 Nustep   Level 3   SPM 100   Minutes 15   METs 4.6     Home Exercise Plan   Plans to continue exercise at [employer] (comment)  [employer]   Frequency Add 2 additional days to program exercise sessions.   Initial Home Exercises Provided 11/24/16      History  Smoking Status  . Former Smoker  . Packs/day: 1.00  . Years: 5.00  . Types:  Cigarettes  . Quit date: 08/06/1972  Smokeless Tobacco  . Never Used    Goals Met:  Independence with exercise equipment Exercise tolerated well Personal goals reviewed No report of cardiac concerns or symptoms Strength training completed today  Goals Unmet:  Not Applicable  Comments: Pt able to follow exercise prescription today without complaint.  Will continue to monitor for progression.    Dr. Emily Filbert is Medical Director for [place] and [name].

## 2016-11-26 ENCOUNTER — Encounter: Payer: Self-pay | Admitting: *Deleted

## 2016-11-26 DIAGNOSIS — Z951 Presence of aortocoronary bypass graft: Secondary | ICD-10-CM

## 2016-11-26 NOTE — Progress Notes (Signed)
Cardiac Individual Treatment Plan  Patient Details  Name: Stuart Henry MRN: [id number] Date of Birth: July 11, 1941 Referring Provider:     Cardiac Rehab from 11/10/2016 in [facility] Cardiac and Pulmonary Rehab  Referring Provider  End, Harrell Gave MD      Initial Encounter Date:    Cardiac Rehab from 11/10/2016 in [facility] Cardiac and Pulmonary Rehab  Date  11/10/16  Referring Provider  End, Harrell Gave MD      Visit Diagnosis: S/P CABG x 4  Patient's Home Medications on Admission:  Current Outpatient Prescriptions:  .  ACCU-CHEK AVIVA PLUS test strip, Check blood sugar once  daily, Disp: 100 each, Rfl: 4 .  allopurinol (ZYLOPRIM) 100 MG tablet, TAKE 1 TABLET DAILY, Disp: 90 tablet, Rfl: 4 .  amiodarone (PACERONE) 200 MG tablet, Take 1 tablet (200 mg total) by mouth daily., Disp: 90 tablet, Rfl: 3 .  apixaban (ELIQUIS) 5 MG TABS tablet, Take 1 tablet (5 mg total) by mouth 2 (two) times daily., Disp: 180 tablet, Rfl: 3 .  atorvastatin (LIPITOR) 40 MG tablet, Take 1 tablet (40 mg total) by mouth daily. (Patient taking differently: Take 40 mg by mouth at bedtime. ), Disp: 30 tablet, Rfl: 5 .  Blood Glucose Monitoring Suppl (ACCU-CHEK AVIVA PLUS) w/Device KIT, Use to check blood sugar daily as directed, Disp: 1 kit, Rfl: 0 .  empagliflozin (JARDIANCE) 25 MG TABS tablet, Take 12.5 mg by mouth daily., Disp: 1 tablet, Rfl: 1 .  fluticasone (FLONASE) 50 MCG/ACT nasal spray, Place 2 sprays into both nostrils daily. (Patient taking differently: Place 2 sprays into both nostrils daily as needed for allergies. ), Disp: 16 g, Rfl: 6 .  gabapentin (NEURONTIN) 300 MG capsule, TAKE ONE CAPSULE BY MOUTH AT BEDTIME FOR NEUROPATHY, Disp: 90 capsule, Rfl: 4 .  glipiZIDE (GLUCOTROL XL) 10 MG 24 hr tablet, 1/2 tablet daily, Disp: 1 tablet, Rfl: 3 .  metFORMIN (GLUCOPHAGE) 1000 MG tablet, Take 1,000 mg by mouth 2 (two) times daily with a meal., Disp: , Rfl:  .  metoprolol tartrate (LOPRESSOR) 25 MG tablet, Take 0.5  tablets (12.5 mg total) by mouth 2 (two) times daily., Disp: 30 tablet, Rfl: 1 .  nitroGLYCERIN (NITROSTAT) 0.4 MG SL tablet, as needed., Disp: , Rfl:  .  pioglitazone (ACTOS) 45 MG tablet, Take 1 tablet (45 mg total) by mouth daily., Disp: 1 tablet, Rfl: 1  Past Medical History: Past Medical History:  Diagnosis Date  . History of gout    "controlled w/daily RX:" (09/26/2016)  . History of hiatal hernia    S/P OR    Tobacco Use: History  Smoking Status  . Former Smoker  . Packs/day: 1.00  . Years: 5.00  . Types: Cigarettes  . Quit date: 08/06/1972  Smokeless Tobacco  . Never Used    Labs: Recent Review Flowsheet Data    Labs for ITP Cardiac and Pulmonary Rehab Latest Ref Rng & Units 09/24/2016 09/24/2016 09/24/2016 09/25/2016 11/14/2016   Cholestrol 100 - 199 mg/dL - - - - -   LDLCALC 0 - 99 mg/dL - - - - -   HDL >39 mg/dL - - - - -   Trlycerides 0 - 149 mg/dL - - - - -   Hemoglobin A1c - - - - - 6.3   PHART 7.350 - 7.450 7.320(L) 7.345(L) - - -   PCO2ART 32.0 - 48.0 mmHg 43.1 40.8 - - -   HCO3 20.0 - 28.0 mmol/L 22.0 22.0 - - -   TCO2  0 - 100 mmol/L '23 23 22 24 ' -   ACIDBASEDEF 0.0 - 2.0 mmol/L 4.0(H) 3.0(H) - - -   O2SAT % 98.0 96.0 - - -       Exercise Target Goals:    Exercise Program Goal: Individual exercise prescription set with THRR, safety & activity barriers. Participant demonstrates ability to understand and report RPE using BORG scale, to self-measure pulse accurately, and to acknowledge the importance of the exercise prescription.  Exercise Prescription Goal: Starting with aerobic activity 30 plus minutes a day, 3 days per week for initial exercise prescription. Provide home exercise prescription and guidelines that participant acknowledges understanding prior to discharge.  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 HR 118 bpm     Max Ex. BP 132/70     2 Minute Post BP 124/70        Oxygen Initial Assessment:   Oxygen Re-Evaluation:   Oxygen Discharge (Final Oxygen Re-Evaluation):   Initial Exercise Prescription:     Initial Exercise Prescription - 11/10/16 1400      Date of Initial Exercise RX and Referring Provider   Date 11/10/16   Referring Provider End, Harrell Gave MD     Treadmill   MPH 2.6   Grade 1   Minutes 15   METs 3.35     Elliptical   Level 1   Speed 3.1   Minutes 15     T5 Nustep   Level 2   SPM 80   Minutes 15   METs 2.5     Prescription Details   Frequency (times per week) 3   Duration Progress to 45 minutes of aerobic exercise without signs/symptoms of physical distress     Intensity   THRR 40-80% of Max Heartrate 94-129   Ratings of Perceived Exertion 11-13   Perceived Dyspnea 0-4     Progression   Progression Continue to progress workloads to maintain intensity without signs/symptoms of physical distress.     Resistance Training   Training Prescription Yes   Weight 3 lbs   Reps 10-15      Perform Capillary Blood Glucose checks as needed.  Exercise Prescription Changes:     Exercise Prescription Changes    Row Name 11/10/16 1400 11/20/16 1100 11/24/16 0900         Response to Exercise   Blood Pressure (Admit) 126/60 108/56  -     Blood Pressure (Exercise) 132/70 152/76  -     Blood Pressure (Exit) 124/70 106/56  -     Heart Rate (Admit) 58 bpm 55 bpm  -  Heart Rate (Exercise) 118 bpm 91 bpm  -      Heart Rate (Exit) 53 bpm 70 bpm  -     Oxygen Saturation (Admit) 99 %  -  -     Oxygen Saturation (Exercise) 99 %  -  -     Rating of Perceived Exertion (Exercise) 8 12  -     Symptoms none none none     Comments walk test results  -  -     Duration  - Continue with 45 min of aerobic exercise without signs/symptoms of physical distress. Continue with 45 min of aerobic exercise without signs/symptoms of physical distress.     Intensity  - THRR unchanged THRR unchanged       Progression   Progression  - Continue to progress workloads to maintain intensity without signs/symptoms of physical distress. Continue to progress workloads to maintain intensity without signs/symptoms of physical distress.     Average METs  - 4.23 4.23       Resistance Training   Training Prescription  - Yes Yes     Weight  - 3 lbs 3 lbs     Reps  - 10-15 10-15       Interval Training   Interval Training  - No No       Bike   Level  - 1.5 1.5     Minutes  - 15 15     METs  - 3.86 3.86       Elliptical   Level  - 1 1     Speed  - 3.1 3.1     Minutes  - 15 15       T5 Nustep   Level  - 3 3     SPM  - 100 100     Minutes  - 15 15     METs  - 4.6 4.6       Home Exercise Plan   Plans to continue exercise at  -  - [employer] (comment)  [employer]     Frequency  -  - Add 2 additional days to program exercise sessions.     Initial Home Exercises Provided  -  - 11/24/16        Exercise Comments:     Exercise Comments    Row Name 11/12/16 [id number] 11/24/16 0841 11/24/16 0901       Exercise Comments  First full day of exercise!  Patient was oriented to gym and equipment including functions, settings, policies, and procedures.  Patient's individual exercise prescription and treatment plan were reviewed.  All starting workloads were established based on the results of the 6 minute walk test done at initial orientation visit.  The plan for exercise progression was also introduced and progression will be  customized based on patient's performance and goals. Reviewed METs average and discussed progression with pt today. Home exercise guidelines were reviewed with patient today. He plans on going to [employer] 2x a week on non-cardiac rehab days to exercise for a total of 5 days per week.         Exercise Goals and Review:     Exercise Goals    Row Name 11/10/16 1445             Exercise Goals   Increase Physical Activity Yes       Intervention Provide advice, education, support and counseling about physical activity/exercise needs.;Develop an individualized exercise prescription for aerobic and  resistive training based on initial evaluation findings, risk stratification, comorbidities and participant's personal goals.       Expected Outcomes Achievement of increased cardiorespiratory fitness and enhanced flexibility, muscular endurance and strength shown through measurements of functional capacity and personal statement of participant.       Increase Strength and Stamina Yes       Intervention Provide advice, education, support and counseling about physical activity/exercise needs.;Develop an individualized exercise prescription for aerobic and resistive training based on initial evaluation findings, risk stratification, comorbidities and participant's personal goals.       Expected Outcomes Achievement of increased cardiorespiratory fitness and enhanced flexibility, muscular endurance and strength shown through measurements of functional capacity and personal statement of participant.          Exercise Goals Re-Evaluation :     Exercise Goals Re-Evaluation    [place] Name 11/20/16 1124             Exercise Goal Re-Evaluation   Exercise Goals Review Increase Physical Activity;Increase Strenth and Stamina       Comments Stuart Henry is off to a good start in rehab.  At first, he did not like coming to exercise since he has been an avid exerciser all his life, but he starting to see the benefit  of coming for education and learning his new limits.  He has been doing well on the equipment and "likes" the challenge of the elliptical as it is his hardest piece.  We will continue to monitor his progression.       Expected Outcomes Short: Begin to increase some of his workloads.  Long: Continue to work on improving his stamina.          Discharge Exercise Prescription (Final Exercise Prescription Changes):     Exercise Prescription Changes - 11/24/16 0900      Response to Exercise   Symptoms none   Duration Continue with 45 min of aerobic exercise without signs/symptoms of physical distress.   Intensity THRR unchanged     Progression   Progression Continue to progress workloads to maintain intensity without signs/symptoms of physical distress.   Average METs 4.23     Resistance Training   Training Prescription Yes   Weight 3 lbs   Reps 10-15     Interval Training   Interval Training No     Bike   Level 1.5   Minutes 15   METs 3.86     Elliptical   Level 1   Speed 3.1   Minutes 15     T5 Nustep   Level 3   SPM 100   Minutes 15   METs 4.6     Home Exercise Plan   Plans to continue exercise at [employer] (comment)  [employer]   Frequency Add 2 additional days to program exercise sessions.   Initial Home Exercises Provided 11/24/16      Nutrition:  Target Goals: Understanding of nutrition guidelines, daily intake of sodium <155m, cholesterol <2032m calories 30% from fat and 7% or less from saturated fats, daily to have 5 or more servings of fruits and vegetables.  Biometrics:     Pre Biometrics - 11/10/16 1445      Pre Biometrics   Height 6' 4.2" (1.935 m)   Weight 205 lb (93 kg)   Waist Circumference 27 inches   Hip Circumference 39.5 inches   Waist to Hip Ratio 0.68 %   BMI (Calculated) 24.9   Single Leg Stand 8.73 seconds  Nutrition Therapy Plan and Nutrition Goals:     Nutrition Therapy & Goals - 11/10/16 1424       Intervention Plan   Intervention Prescribe, educate and counsel regarding individualized specific dietary modifications aiming towards targeted core components such as weight, hypertension, lipid management, diabetes, heart failure and other comorbidities.   Expected Outcomes Short Term Goal: Understand basic principles of dietary content, such as calories, fat, sodium, cholesterol and nutrients.;Short Term Goal: A plan has been developed with personal nutrition goals set during dietitian appointment.;Long Term Goal: Adherence to prescribed nutrition plan.      Nutrition Discharge: Rate Your Plate Scores:     Nutrition Assessments - 11/10/16 1424      MEDFICTS Scores   Pre Score 10      Nutrition Goals Re-Evaluation:   Nutrition Goals Discharge (Final Nutrition Goals Re-Evaluation):   Psychosocial: Target Goals: Acknowledge presence or absence of significant depression and/or stress, maximize coping skills, provide positive support system. Participant is able to verbalize types and ability to use techniques and skills needed for reducing stress and depression.   Initial Review & Psychosocial Screening:     Initial Psych Review & Screening - 11/10/16 1425      Initial Review   Current issues with None Identified     Family Dynamics   Good Support System? Yes  Wife and friends     Barriers   Psychosocial barriers to participate in program There are no identifiable barriers or psychosocial needs.;The patient should benefit from training in stress management and relaxation.     Screening Interventions   Interventions Encouraged to exercise      Quality of Life Scores:      Quality of Life - 11/10/16 1426      Quality of Life Scores   Health/Function Pre 25.6 %   Socioeconomic Pre 26.29 %   Psych/Spiritual Pre 26.36 %   Family Pre 28.5 %   GLOBAL Pre 26.26 %      PHQ-9: Recent Review Flowsheet Data    Depression screen [facility] 2/9 11/14/2016 11/10/2016 05/13/2016  02/23/2015   Decreased Interest 0 0 0 0   Down, Depressed, Hopeless 0 0 0 0   PHQ - 2 Score 0 0 0 0   Altered sleeping 0 0 - -   Tired, decreased energy 3 1 - -   Change in appetite 0 0 - -   Feeling bad or failure about yourself  0 0 - -   Trouble concentrating 0 0 - -   Moving slowly or fidgety/restless 0 0 - -   Suicidal thoughts 0 0 - -   PHQ-9 Score 3 1 - -     Interpretation of Total Score  Total Score Depression Severity:  1-4 = Minimal depression, 5-9 = Mild depression, 10-14 = Moderate depression, 15-19 = Moderately severe depression, 20-27 = Severe depression   Psychosocial Evaluation and Intervention:     Psychosocial Evaluation - 11/12/16 0939      Psychosocial Evaluation & Interventions   Interventions Encouraged to exercise with the program and follow exercise prescription;Stress management education   Comments Counselor met with Mr. Ohern Stuart Henry) today for initial psychosocial evaluation.  He is a 76 year old who had a CABGx4 on 2/21.  Stuart Henry has a strong support system with a spouse of 16 years.  He also has (2) siblings who live close by and many close friends.  He also struggles with diabetes.  Stuart Henry states he always slept well  prior to the surgery in February.  Now he is up and down going to the bathroom all night, but still reports he sleeps at least 6 hours each night.  He states his appetite is improving recently as well.  Stuart Henry denies a history of depression or anxiety or any current symptoms and is typically in a positive mood.  He states his health is his current primary stressor; since he is "confused" on why he has always been extremely active with golf and biking, etc., and no family history of heart disease; and yet he had quadruple blockages.  His goals for this program are to learn to eat healthier and to "make this program worthwhile."  Following this program, he will continue to walk and exercise consistently since this has been part of his normal lifestyle.   Staff will follow with Stuart Henry throughout the course of this program.   Expected Outcomes Stuart Henry will exercise consistently and hopefully begin to sleep better and regain his strength and stamina.  He will benefit from attendance and participation in the psychoeducational components of this program to help with coping with his current health concerns.  Stuart Henry will meet with the dietician to help with his healthier eating goals.     Continue Psychosocial Services  Follow up required by staff      Psychosocial Re-Evaluation:   Psychosocial Discharge (Final Psychosocial Re-Evaluation):   Vocational Rehabilitation: Provide vocational rehab assistance to qualifying candidates.   Vocational Rehab Evaluation & Intervention:     Vocational Rehab - 11/10/16 1428      Initial Vocational Rehab Evaluation & Intervention   Assessment shows need for Vocational Rehabilitation No      Education: Education Goals: Education classes will be provided on a weekly basis, covering required topics. Participant will state understanding/return demonstration of topics presented.  Learning Barriers/Preferences:     Learning Barriers/Preferences - 11/10/16 1428      Learning Barriers/Preferences   Learning Barriers None   Learning Preferences None      Education Topics: General Nutrition Guidelines/Fats and Fiber: -Group instruction provided by verbal, written material, models and posters to present the general guidelines for heart healthy nutrition. Gives an explanation and review of dietary fats and fiber.   Controlling Sodium/Reading Food Labels: -Group verbal and written material supporting the discussion of sodium use in heart healthy nutrition. Review and explanation with models, verbal and written materials for utilization of the food label.   Exercise Physiology & Risk Factors: - Group verbal and written instruction with models to review the exercise physiology of the cardiovascular system and  associated critical values. Details cardiovascular disease risk factors and the goals associated with each risk factor.   Aerobic Exercise & Resistance Training: - Gives group verbal and written discussion on the health impact of inactivity. On the components of aerobic and resistive training programs and the benefits of this training and how to safely progress through these programs.   Flexibility, Balance, General Exercise Guidelines: - Provides group verbal and written instruction on the benefits of flexibility and balance training programs. Provides general exercise guidelines with specific guidelines to those with heart or lung disease. Demonstration and skill practice provided.   Stress Management: - Provides group verbal and written instruction about the health risks of elevated stress, cause of high stress, and healthy ways to reduce stress.   Depression: - Provides group verbal and written instruction on the correlation between heart/lung disease and depressed mood, treatment options, and the stigmas associated with seeking  treatment.   Anatomy & Physiology of the Heart: - Group verbal and written instruction and models provide basic cardiac anatomy and physiology, with the coronary electrical and arterial systems. Review of: AMI, Angina, Valve disease, Heart Failure, Cardiac Arrhythmia, Pacemakers, and the ICD.   Cardiac Procedures: - Group verbal and written instruction and models to describe the testing methods done to diagnose heart disease. Reviews the outcomes of the test results. Describes the treatment choices: Medical Management, Angioplasty, or Coronary Bypass Surgery.   Cardiac Medications: - Group verbal and written instruction to review commonly prescribed medications for heart disease. Reviews the medication, class of the drug, and side effects. Includes the steps to properly store meds and maintain the prescription regimen.   Cardiac Rehab from 11/24/2016 in [facility]  Cardiac and Pulmonary Rehab  Date  11/19/16 Marisue Humble 2]  Educator  SB  Instruction Review Code  1- partially meets, needs review/practice [part one]      Go Sex-Intimacy & Heart Disease, Get SMART - Goal Setting: - Group verbal and written instruction through game format to discuss heart disease and the return to sexual intimacy. Provides group verbal and written material to discuss and apply goal setting through the application of the S.M.A.R.T. Method.   Other Matters of the Heart: - Provides group verbal, written materials and models to describe Heart Failure, Angina, Valve Disease, and Diabetes in the realm of heart disease. Includes description of the disease process and treatment options available to the cardiac patient.   Exercise & Equipment Safety: - Individual verbal instruction and demonstration of equipment use and safety with use of the equipment.   Cardiac Rehab from 11/24/2016 in [facility] Cardiac and Pulmonary Rehab  Date  11/10/16  Educator  Sb  Instruction Review Code  2- meets goals/outcomes      Infection Prevention: - Provides verbal and written material to individual with discussion of infection control including proper hand washing and proper equipment cleaning during exercise session.   Cardiac Rehab from 11/24/2016 in [facility] Cardiac and Pulmonary Rehab  Date  11/10/16  Educator  SB  Instruction Review Code  2- meets goals/outcomes      Falls Prevention: - Provides verbal and written material to individual with discussion of falls prevention and safety.   Cardiac Rehab from 11/24/2016 in [facility] Cardiac and Pulmonary Rehab  Date  11/10/16  Educator  SB  Instruction Review Code  2- meets goals/outcomes      Diabetes: - Individual verbal and written instruction to review signs/symptoms of diabetes, desired ranges of glucose level fasting, after meals and with exercise. Advice that pre and post exercise glucose checks will be done for 3 sessions at entry of program.    Cardiac Rehab from 11/24/2016 in [facility] Cardiac and Pulmonary Rehab  Date  11/10/16  Educator  SB  Instruction Review Code  2- meets goals/outcomes       Knowledge Questionnaire Score:     Knowledge Questionnaire Score - 11/19/16 1510      Knowledge Questionnaire Score   Pre Score 22/28  reviewed test with pt      Core Components/Risk Factors/Patient Goals at Admission:     Personal Goals and Risk Factors at Admission - 11/10/16 1428      Core Components/Risk Factors/Patient Goals on Admission    Weight Management Yes;Weight Loss   Intervention Weight Management: Develop a combined nutrition and exercise program designed to reach desired caloric intake, while maintaining appropriate intake of nutrient and fiber, sodium and fats, and  appropriate energy expenditure required for the weight goal.;Weight Management: Provide education and appropriate resources to help participant work on and attain dietary goals.   Admit Weight 205 lb (93 kg)   Goal Weight: Short Term 203 lb (92.1 kg)   Goal Weight: Long Term 200 lb (90.7 kg)   Expected Outcomes Long Term: Adherence to nutrition and physical activity/exercise program aimed toward attainment of established weight goal;Short Term: Continue to assess and modify interventions until short term weight is achieved;Weight Maintenance: Understanding of the daily nutrition guidelines, which includes 25-35% calories from fat, 7% or less cal from saturated fats, less than 215m cholesterol, less than 1.5gm of sodium, & 5 or more servings of fruits and vegetables daily   Diabetes Yes  LAst A1C over 9%, has changed nutriton habits significantly since. FBA are 80s now were over 110   Intervention Provide education about signs/symptoms and action to take for hypo/hyperglycemia.;Provide education about proper nutrition, including hydration, and aerobic/resistive exercise prescription along with prescribed medications to achieve blood glucose in normal ranges:  Fasting glucose 65-99 mg/dL   Expected Outcomes Short Term: Participant verbalizes understanding of the signs/symptoms and immediate care of hyper/hypoglycemia, proper foot care and importance of medication, aerobic/resistive exercise and nutrition plan for blood glucose control.;Long Term: Attainment of HbA1C < 7%.   Hypertension Yes   Intervention Provide education on lifestyle modifcations including regular physical activity/exercise, weight management, moderate sodium restriction and increased consumption of fresh fruit, vegetables, and low fat dairy, alcohol moderation, and smoking cessation.;Monitor prescription use compliance.   Expected Outcomes Short Term: Continued assessment and intervention until BP is < 140/960mHG in hypertensive participants. < 130/804mG in hypertensive participants with diabetes, heart failure or chronic kidney disease.;Long Term: Maintenance of blood pressure at goal levels.   Lipids Yes   Intervention Provide education and support for participant on nutrition & aerobic/resistive exercise along with prescribed medications to achieve LDL <62m57mDL >40mg6mExpected Outcomes Short Term: Participant states understanding of desired cholesterol values and is compliant with medications prescribed. Participant is following exercise prescription and nutrition guidelines.;Long Term: Cholesterol controlled with medications as prescribed, with individualized exercise RX and with personalized nutrition plan. Value goals: LDL < 62mg,28m > 40 mg.      Core Components/Risk Factors/Patient Goals Review:    Core Components/Risk Factors/Patient Goals at Discharge (Final Review):    ITP Comments:     ITP Comments    Row Name 11/10/16 1413 11/13/16 1345 11/14/16 0950 11/26/16 0537     ITP Comments Medical review completed today. Initial ITP created.  Documentation of diagnossi can be found [facility] Encounter 09/22/2016 Called Mr. GibsonSlinger his concerns with rehab.  Apologized  for confusion from first day.  Discussed with him how rehab is a safety net to get him back to where he was before his surgery.  Told him that we like to montior his exercise response with his new medication before he goes back at it on his own.  Also allows him to test his limits in a sae environment.  We talked some about the education side of rehab as well to learn more about his disease process and what it means going forward.  We discussed his goal of getting back to the gym regularlly and getting back out on his bike once cleared and how we can work towards these goals in rehab.  He voiced understanding to where we were coming from.  Mr. GibsonMorozovd more open to rehab today.  We tried to take our time and explain each step of our process today.  He rather enjoyed learning about the A[employer] that Kayleen Memos presented today after questions from the group earlier this week.  We tried out the bike instead of the treadmill today and he said he got a good workout on  It. He also did the elliptical again today with good success despite not being his favorite piece of equipment.  After class I asked if things had gone better today and he said they did. 30 day review. Continue with ITP unless directed changes per Medical Director review       Comments:

## 2016-11-26 NOTE — Progress Notes (Signed)
Daily Session Note  Patient Details  Name: Stuart Henry MRN: [id number] Date of Birth: [birth year] Referring Provider:     Cardiac Rehab from 11/10/2016 in [facility] Cardiac and Pulmonary Rehab  Referring Provider  End, Harrell Gave MD      Encounter Date: 11/26/2016  Check In:     Session Check In - 11/26/16 0800      Check-In   Location [facility]-Cardiac & Pulmonary Rehab   Staff Present Heath Lark, RN, BSN, CCRP;Jessica Cyril, MA, ACSM RCEP, Exercise Physiologist;Sriram Febles Oletta Darter, BA, ACSM CEP, Exercise Physiologist   Supervising physician immediately available to respond to emergencies See telemetry face sheet for immediately available ER MD   Medication changes reported     No   Fall or balance concerns reported    No   Warm-up and Cool-down Performed on first and last piece of equipment   Resistance Training Performed Yes   VAD Patient? No     Pain Assessment   Currently in Pain? No/denies         History  Smoking Status  . Former Smoker  . Packs/day: 1.00  . Years: 5.00  . Types: Cigarettes  . Quit date: 08/06/1972  Smokeless Tobacco  . Never Used    Goals Met:  Independence with exercise equipment Exercise tolerated well No report of cardiac concerns or symptoms Strength training completed today  Goals Unmet:  Not Applicable  Comments: Pt able to follow exercise prescription today without complaint.  Will continue to monitor for progression.    Dr. Emily Filbert is Medical Director for [place] and [name].

## 2016-11-28 ENCOUNTER — Encounter: Payer: Medicare Other | Admitting: *Deleted

## 2016-11-28 DIAGNOSIS — Z951 Presence of aortocoronary bypass graft: Secondary | ICD-10-CM | POA: Diagnosis not present

## 2016-11-28 NOTE — Progress Notes (Signed)
Daily Session Note  Patient Details  Name: Stuart Henry MRN: [id number] Date of Birth: 03-Nov-1940 Referring Provider:     Cardiac Rehab from 11/10/2016 in [facility] Cardiac and Pulmonary Rehab  Referring Provider  End, Harrell Gave MD      Encounter Date: 11/28/2016  Check In:     Session Check In - 11/28/16 0824      Check-In   Location [facility]iac & Pulmonary Rehab   Staff Present Gerlene Burdock, RN, BSN;Mary Kellie Shropshire, RN, BSN, Willette Pa, MA, ACSM RCEP, Exercise Physiologist   Supervising physician immediately available to respond to emergencies See telemetry face sheet for immediately available ER MD   Medication changes reported     No   Fall or balance concerns reported    No   Warm-up and Cool-down Performed on first and last piece of equipment   Resistance Training Performed Yes   VAD Patient? No     Pain Assessment   Currently in Pain? No/denies         History  Smoking Status  . Former Smoker  . Packs/day: 1.00  . Years: 5.00  . Types: Cigarettes  . Quit date: 08/06/1972  Smokeless Tobacco  . Never Used    Goals Met:  Proper associated with RPD/PD & O2 Sat Exercise tolerated well  Goals Unmet:  Not Applicable  Comments:     Dr. Emily Filbert is Medical Director for [name] and [name].

## 2016-12-01 ENCOUNTER — Encounter: Payer: Medicare Other | Admitting: *Deleted

## 2016-12-01 DIAGNOSIS — Z951 Presence of aortocoronary bypass graft: Secondary | ICD-10-CM

## 2016-12-01 NOTE — Progress Notes (Signed)
Daily Session Note  Patient Details  Name: CABOT CROMARTIE MRN: [id number] Date of Birth: 1941/02/21 Referring Provider:     Cardiac Rehab from 11/10/2016 in [facility] Cardiac and Pulmonary Rehab  Referring Provider  End, Harrell Gave MD      Encounter Date: 12/01/2016  Check In:     Session Check In - 12/01/16 0826      Check-In   Location [facility]Cardiac & Pulmonary Rehab   Staff Present Gerlene Burdock, RN, Moises Blood, BS, ACSM CEP, Exercise Physiologist;Jessica Luan Pulling, [place], ACSM RCEP, Exercise Physiologist   Supervising physician immediately available to respond to emergencies See telemetry face sheet for immediately available ER MD   Medication changes reported     No   Fall or balance concerns reported    No   Warm-up and Cool-down Performed on first and last piece of equipment   Resistance Training Performed Yes   VAD Patient? No     Pain Assessment   Currently in Pain? No/denies   Multiple Pain Sites No         History  Smoking Status  . Former Smoker  . Packs/day: 1.00  . Years: 5.00  . Types: Cigarettes  . Quit date: 08/06/1972  Smokeless Tobacco  . Never Used    Goals Met:  Independence with exercise equipment Exercise tolerated well No report of cardiac concerns or symptoms Strength training completed today  Goals Unmet:  Not Applicable  Comments: Pt able to follow exercise prescription today without complaint.  Will continue to monitor for progression.    Dr. Emily Filbert is Medical Director for [place] and [name].

## 2016-12-03 ENCOUNTER — Encounter: Payer: Medicare Other | Attending: Thoracic Surgery (Cardiothoracic Vascular Surgery)

## 2016-12-03 DIAGNOSIS — Z951 Presence of aortocoronary bypass graft: Secondary | ICD-10-CM | POA: Insufficient documentation

## 2016-12-03 NOTE — Progress Notes (Signed)
Daily Session Note  Patient Details  Name: Stuart Henry MRN: [id number] Date of Birth: 1941/05/27 Referring Provider:     Cardiac Rehab from 11/10/2016 in [facility] Cardiac and Pulmonary Rehab  Referring Provider  End, Harrell Gave MD      Encounter Date: 12/03/2016  Check In:     Session Check In - 12/03/16 0837      Check-In   Location [facility]   Staff Present Heath Lark, RN, BSN, CCRP;Jessica Luan Pulling, MA, ACSM RCEP, Exercise Physiologist;Nidal Rivet Oletta Darter, BA, ACSM CEP, Exercise Physiologist   Supervising physician immediately available to respond to emergencies See telemetry face sheet for immediately available ER MD   Medication changes reported     No   Fall or balance concerns reported    No   Warm-up and Cool-down Performed on first and last piece of equipment   Resistance Training Performed Yes   VAD Patient? No     Pain Assessment   Currently in Pain? No/denies         History  Smoking Status  . Former Smoker  . Packs/day: 1.00  . Years: 5.00  . Types: Cigarettes  . Quit date: 08/06/1972  Smokeless Tobacco  . Never Used    Goals Met:  Independence with exercise equipment Exercise tolerated well No report of cardiac concerns or symptoms Strength training completed today  Goals Unmet:  Not Applicable  Comments: Pt able to follow exercise prescription today without complaint.  Will continue to monitor for progression.    Dr. Emily Filbert is Medical Director for [place] and [name].

## 2016-12-05 ENCOUNTER — Encounter: Payer: Medicare Other | Admitting: *Deleted

## 2016-12-05 DIAGNOSIS — Z951 Presence of aortocoronary bypass graft: Secondary | ICD-10-CM | POA: Diagnosis not present

## 2016-12-05 NOTE — Progress Notes (Signed)
Daily Session Note  Patient Details  Name: Stuart Henry MRN: [id number] Date of Birth: 03/31/1941 Referring Provider:     Cardiac Rehab from 11/10/2016 in [facility] Cardiac and Pulmonary Rehab  Referring Provider  End, Christopher MD      Encounter Date: 12/05/2016  Check In:     Session Check In - 12/05/16 0849      Check-In   Location [facility] Re[facility]   Staff Present Susanne Bice, RN, BSN, CCRP;Carroll Enterkin, RN, BSN;Jessica Hawkins, MA, ACSM RCEP, Exercise Physiologist   Supervising physician immediately available to respond to emergencies See telemetry face sheet for immediately available ER MD   Medication changes reported     Yes   Fall or balance concerns reported    No   Warm-up and Cool-down Performed on first and last piece of equipment   Resistance Training Performed Yes   VAD Patient? No     Pain Assessment   Currently in Pain? No/denies         History  Smoking Status  . Former Smoker  . Packs/day: 1.00  . Years: 5.00  . Types: Cigarettes  . Quit date: 08/06/1972  Smokeless Tobacco  . Never Used    Goals Met:  Proper associated with RPD/PD & O2 Sat Exercise tolerated well No report of cardiac concerns or symptoms  Goals Unmet:  Not Applicable  Comments:     Dr. Mark Miller is Medical Director for [facility] and [name]. 

## 2016-12-09 ENCOUNTER — Encounter: Payer: Medicare Other | Admitting: Respiratory Therapy

## 2016-12-09 DIAGNOSIS — Z951 Presence of aortocoronary bypass graft: Secondary | ICD-10-CM | POA: Diagnosis not present

## 2016-12-09 NOTE — Progress Notes (Signed)
Daily Session Note  Patient Details  Name: SHERILL WEGENER MRN: [id number] Date of Birth: 1941/05/23 Referring Provider:     Cardiac Rehab from 11/10/2016 in [facility] Cardiac and Pulmonary Rehab  Referring Provider  End, Harrell Gave MD      Encounter Date: 12/09/2016  Check In:     Session Check In - 12/09/16 0843      Check-In   Location [facility] Re[facility]   Staff Present Alberteen Sam, MA, ACSM RCEP, Exercise Physiologist;Laureen Owens Shark, BS, RRT, Respiratory Therapist;Carroll Enterkin, RN, BSN   Supervising physician immediately available to respond to emergencies See telemetry face sheet for immediately available ER MD   Medication changes reported     No   Fall or balance concerns reported    No   Tobacco Cessation No Change   Warm-up and Cool-down Performed on first and last piece of equipment   Resistance Training Performed Yes   VAD Patient? No     Pain Assessment   Currently in Pain? No/denies   Multiple Pain Sites No         History  Smoking Status   Former Smoker   Packs/day: 1.00   Years: 5.00   Types: Cigarettes   Quit date: 08/06/1972  Smokeless Tobacco   Never Used    Goals Met:  Proper associated with RPD/PD & O2 Sat Independence with exercise equipment Exercise tolerated well No report of cardiac concerns or symptoms Strength training completed today  Goals Unmet:  Not Applicable  Comments: Pt able to follow exercise prescription today without complaint.  Will continue to monitor for progression.   Dr. Emily Filbert is Medical Director for [place] and [name].

## 2016-12-10 DIAGNOSIS — Z951 Presence of aortocoronary bypass graft: Secondary | ICD-10-CM

## 2016-12-10 NOTE — Progress Notes (Signed)
Daily Session Note  Patient Details  Name: Stuart Henry MRN: [id number] Date of Birth: 1940-12-03 Referring Provider:     Cardiac Rehab from 11/10/2016 in [facility] Cardiac and Pulmonary Rehab  Referring Provider  End, Harrell Gave MD      Encounter Date: 12/10/2016  Check In:     Session Check In - 12/10/16 0802      Check-In   Location [facility]   Staff Present Alberteen Sam, MA, ACSM RCEP, Exercise Physiologist;Mary Kellie Shropshire, RN, BSN, Kela Millin, BA, ACSM CEP, Exercise Physiologist   Supervising physician immediately available to respond to emergencies See telemetry face sheet for immediately available ER MD   Medication changes reported     No   Fall or balance concerns reported    No   Warm-up and Cool-down Performed on first and last piece of equipment   Resistance Training Performed Yes   VAD Patient? No     Pain Assessment   Currently in Pain? No/denies         History  Smoking Status  . Former Smoker  . Packs/day: 1.00  . Years: 5.00  . Types: Cigarettes  . Quit date: 08/06/1972  Smokeless Tobacco  . Never Used    Goals Met:  Independence with exercise equipment Exercise tolerated well No report of cardiac concerns or symptoms Strength training completed today  Goals Unmet:  Not Applicable  Comments: Pt able to follow exercise prescription today without complaint.  Will continue to monitor for progression.    Dr. Emily Filbert is Medical Director for [place] and [name].

## 2016-12-12 ENCOUNTER — Encounter: Payer: Medicare Other | Admitting: *Deleted

## 2016-12-12 DIAGNOSIS — Z951 Presence of aortocoronary bypass graft: Secondary | ICD-10-CM

## 2016-12-12 NOTE — Progress Notes (Signed)
Daily Session Note  Patient Details  Name: Stuart Henry MRN: [id number] Date of Birth: Mar 04, 1941 Referring Provider:     Cardiac Rehab from 11/10/2016 in [facility] Cardiac and Pulmonary Rehab  Referring Provider  End, Harrell Gave MD      Encounter Date: 12/12/2016  Check In:     Session Check In - 12/12/16 0851      Check-In   Location [facility] Re[facility]   Staff Present Gerlene Burdock, RN, BSN;Susanne Bice, RN, BSN, CCRP;Jessica Luan Pulling, MA, ACSM RCEP, Exercise Physiologist   Supervising physician immediately available to respond to emergencies See telemetry face sheet for immediately available ER MD   Medication changes reported     No   Fall or balance concerns reported    No   Tobacco Cessation No Change   Warm-up and Cool-down Performed on first and last piece of equipment   Resistance Training Performed No   VAD Patient? No     Pain Assessment   Currently in Pain? No/denies         History  Smoking Status  . Former Smoker  . Packs/day: 1.00  . Years: 5.00  . Types: Cigarettes  . Quit date: 08/06/1972  Smokeless Tobacco  . Never Used    Goals Met:  Proper associated with RPD/PD & O2 Sat Exercise tolerated well No report of cardiac concerns or symptoms  Goals Unmet:  Not Applicable  Comments:     Dr. Emily Filbert is Medical Director for [place] and [name].

## 2016-12-15 ENCOUNTER — Encounter: Payer: Medicare Other | Admitting: *Deleted

## 2016-12-15 DIAGNOSIS — Z951 Presence of aortocoronary bypass graft: Secondary | ICD-10-CM | POA: Diagnosis not present

## 2016-12-15 NOTE — Progress Notes (Signed)
Daily Session Note  Patient Details  Name: Stuart Henry MRN: [id number] Date of Birth: 11/12/1940 Referring Provider:     Cardiac Rehab from 11/10/2016 in [facility] Cardiac and Pulmonary Rehab  Referring Provider  End, Christopher MD      Encounter Date: 12/15/2016  Check In:     Session Check In - 12/15/16 0800      Check-In   Location [facility] Pul[facility] Re[facility]   Staff Present Kelly Hayes, BS, ACSM CEP, Exercise Physiologist;Jessica Hawkins, MA, ACSM RCEP, Exercise Physiologist;Carroll Enterkin, RN, BSN   Supervising physician immediately available to respond to emergencies See telemetry face sheet for immediately available ER MD   Medication changes reported     No   Fall or balance concerns reported    No   Warm-up and Cool-down Performed on first and last piece of equipment   Resistance Training Performed Yes   VAD Patient? No     Pain Assessment   Currently in Pain? No/denies   Multiple Pain Sites No         History  Smoking Status  . Former Smoker  . Packs/day: 1.00  . Years: 5.00  . Types: Cigarettes  . Quit date: 08/06/1972  Smokeless Tobacco  . Never Used    Goals Met:  Independence with exercise equipment Exercise tolerated well No report of cardiac concerns or symptoms Strength training completed today  Goals Unmet:  Not Applicable  Comments: Pt able to follow exercise prescription today without complaint.  Will continue to monitor for progression.    Dr. Mark Miller is Medical Director for [facility] and [name]. 

## 2016-12-17 DIAGNOSIS — Z951 Presence of aortocoronary bypass graft: Secondary | ICD-10-CM

## 2016-12-17 NOTE — Progress Notes (Signed)
Daily Session Note  Patient Details  Name: KYLIE GROS MRN: [id number] Date of Birth: 01/13/1941 Referring Provider:     Cardiac Rehab from 11/10/2016 in [facility] Cardiac and Pulmonary Rehab  Referring Provider  End, Harrell Gave MD      Encounter Date: 12/17/2016  Check In:     Session Check In - 12/17/16 0758      Check-In   Location [facility]monary Re[facility]   Staff Present Heath Lark, RN, BSN, CCRP;Jessica Luan Pulling, MA, ACSM RCEP, Exercise Physiologist;Amanda Oletta Darter, BA, ACSM CEP, Exercise Physiologist   Supervising physician immediately available to respond to emergencies See telemetry face sheet for immediately available ER MD   Medication changes reported     No   Fall or balance concerns reported    No   Warm-up and Cool-down Performed on first and last piece of equipment   Resistance Training Performed Yes   VAD Patient? No     Pain Assessment   Currently in Pain? No/denies         History  Smoking Status  . Former Smoker  . Packs/day: 1.00  . Years: 5.00  . Types: Cigarettes  . Quit date: 08/06/1972  Smokeless Tobacco  . Never Used    Goals Met:  Independence with exercise equipment Exercise tolerated well Personal goals reviewed No report of cardiac concerns or symptoms Strength training completed today  Goals Unmet:  Not Applicable  Comments: Pt able to follow exercise prescription today without complaint.  Will continue to monitor for progression. See ITP for goal review.   Dr. Emily Filbert is Medical Director for [place] and [name].

## 2016-12-19 ENCOUNTER — Encounter: Payer: Medicare Other | Admitting: *Deleted

## 2016-12-19 DIAGNOSIS — Z951 Presence of aortocoronary bypass graft: Secondary | ICD-10-CM

## 2016-12-19 NOTE — Progress Notes (Signed)
Daily Session Note  Patient Details  Name: DERWIN REDDY MRN: [id number] Date of Birth: [birth year] Referring Provider:     Cardiac Rehab from 11/10/2016 in [facility] Cardiac and Pulmonary Rehab  Referring Provider  End, Harrell Gave MD      Encounter Date: 12/19/2016  Check In:     Session Check In - 12/19/16 0808      Check-In   Location [facility] Re[facility]   Staff Present Heath Lark, RN, BSN, CCRP;Jessica Luan Pulling, MA, ACSM RCEP, Exercise Physiologist;Jaxsin Bottomley, RN, BSN   Supervising physician immediately available to respond to emergencies See telemetry face sheet for immediately available ER MD   Medication changes reported     No   Fall or balance concerns reported    No   Tobacco Cessation No Change   Warm-up and Cool-down Performed on first and last piece of equipment   Resistance Training Performed Yes   VAD Patient? No     Pain Assessment   Currently in Pain? No/denies         History  Smoking Status  . Former Smoker  . Packs/day: 1.00  . Years: 5.00  . Types: Cigarettes  . Quit date: 08/06/1972  Smokeless Tobacco  . Never Used    Goals Met:  Proper associated with RPD/PD & O2 Sat Exercise tolerated well No report of cardiac concerns or symptoms  Goals Unmet:  Not Applicable  Comments:     Dr. Emily Filbert is Medical Director for [place] and [name].

## 2016-12-22 ENCOUNTER — Encounter: Payer: Medicare Other | Admitting: *Deleted

## 2016-12-22 DIAGNOSIS — Z951 Presence of aortocoronary bypass graft: Secondary | ICD-10-CM

## 2016-12-22 NOTE — Progress Notes (Signed)
Daily Session Note  Patient Details  Name: Stuart Henry MRN: [id number] Date of Birth: 06/18/1941 Referring Provider:     Cardiac Rehab from 11/10/2016 in [facility] Cardiac and Pulmonary Rehab  Referring Provider  End, Harrell Gave MD      Encounter Date: 12/22/2016  Check In:     Session Check In - 12/22/16 0748      Check-In   Location [facility] Re[facility]   Staff Present Gerlene Burdock, RN, Levie Heritage, MA, ACSM RCEP, Exercise Physiologist;Sukhraj Esquivias Amedeo Plenty, BS, ACSM CEP, Exercise Physiologist   Supervising physician immediately available to respond to emergencies See telemetry face sheet for immediately available ER MD   Medication changes reported     No   Fall or balance concerns reported    No   Warm-up and Cool-down Performed on first and last piece of equipment   Resistance Training Performed Yes   VAD Patient? No     Pain Assessment   Currently in Pain? No/denies   Multiple Pain Sites No         History  Smoking Status  . Former Smoker  . Packs/day: 1.00  . Years: 5.00  . Types: Cigarettes  . Quit date: 08/06/1972  Smokeless Tobacco  . Never Used    Goals Met:  Independence with exercise equipment Exercise tolerated well No report of cardiac concerns or symptoms Strength training completed today  Goals Unmet:  Not Applicable  Comments: Pt able to follow exercise prescription today without complaint.  Will continue to monitor for progression.    Dr. Emily Filbert is Medical Director for [place] and [name].

## 2016-12-24 ENCOUNTER — Encounter: Payer: Self-pay | Admitting: *Deleted

## 2016-12-24 ENCOUNTER — Encounter: Payer: Medicare Other | Admitting: *Deleted

## 2016-12-24 DIAGNOSIS — Z951 Presence of aortocoronary bypass graft: Secondary | ICD-10-CM

## 2016-12-24 NOTE — Progress Notes (Signed)
Daily Session Note  Patient Details  Name: Stuart Henry MRN: [id number] Date of Birth: 1941-06-14 Referring Provider:     Cardiac Rehab from 11/10/2016 in [facility] Cardiac and Pulmonary Rehab  Referring Provider  End, Harrell Gave MD      Encounter Date: 12/24/2016  Check In:     Session Check In - 12/24/16 1740      Check-In   Location [facility]Card[facility] Pulmonary Rehab   Staff Present Renita Papa, RN BSN;Carroll Enterkin, RN, Vickki Hearing, BA, ACSM CEP, Exercise Physiologist   Supervising physician immediately available to respond to emergencies See telemetry face sheet for immediately available ER MD   Medication changes reported     No   Fall or balance concerns reported    No   Warm-up and Cool-down Performed on first and last piece of equipment   Resistance Training Performed Yes   VAD Patient? No     Pain Assessment   Currently in Pain? No/denies         History  Smoking Status  . Former Smoker  . Packs/day: 1.00  . Years: 5.00  . Types: Cigarettes  . Quit date: 08/06/1972  Smokeless Tobacco  . Never Used    Goals Met:  Independence with exercise equipment Exercise tolerated well No report of cardiac concerns or symptoms Strength training completed today  Goals Unmet:  Not Applicable  Comments: Pt able to follow exercise prescription today without complaint.  Will continue to monitor for progression.    Dr. Emily Filbert is Medical Director for [place] and [name].

## 2016-12-24 NOTE — Progress Notes (Signed)
Cardiac Individual Treatment Plan  Patient Details  Name: Stuart Henry MRN: [id number] Date of Birth: July 11, 1941 Referring Provider:     Cardiac Rehab from 11/10/2016 in [facility] Cardiac and Pulmonary Rehab  Referring Provider  End, Harrell Gave MD      Initial Encounter Date:    Cardiac Rehab from 11/10/2016 in [facility] Cardiac and Pulmonary Rehab  Date  11/10/16  Referring Provider  End, Harrell Gave MD      Visit Diagnosis: S/P CABG x 4  Patient's Home Medications on Admission:  Current Outpatient Prescriptions:  .  ACCU-CHEK AVIVA PLUS test strip, Check blood sugar once  daily, Disp: 100 each, Rfl: 4 .  allopurinol (ZYLOPRIM) 100 MG tablet, TAKE 1 TABLET DAILY, Disp: 90 tablet, Rfl: 4 .  amiodarone (PACERONE) 200 MG tablet, Take 1 tablet (200 mg total) by mouth daily., Disp: 90 tablet, Rfl: 3 .  apixaban (ELIQUIS) 5 MG TABS tablet, Take 1 tablet (5 mg total) by mouth 2 (two) times daily., Disp: 180 tablet, Rfl: 3 .  atorvastatin (LIPITOR) 40 MG tablet, Take 1 tablet (40 mg total) by mouth daily. (Patient taking differently: Take 40 mg by mouth at bedtime. ), Disp: 30 tablet, Rfl: 5 .  Blood Glucose Monitoring Suppl (ACCU-CHEK AVIVA PLUS) w/Device KIT, Use to check blood sugar daily as directed, Disp: 1 kit, Rfl: 0 .  empagliflozin (JARDIANCE) 25 MG TABS tablet, Take 12.5 mg by mouth daily., Disp: 1 tablet, Rfl: 1 .  fluticasone (FLONASE) 50 MCG/ACT nasal spray, Place 2 sprays into both nostrils daily. (Patient taking differently: Place 2 sprays into both nostrils daily as needed for allergies. ), Disp: 16 g, Rfl: 6 .  gabapentin (NEURONTIN) 300 MG capsule, TAKE ONE CAPSULE BY MOUTH AT BEDTIME FOR NEUROPATHY, Disp: 90 capsule, Rfl: 4 .  glipiZIDE (GLUCOTROL XL) 10 MG 24 hr tablet, 1/2 tablet daily, Disp: 1 tablet, Rfl: 3 .  metFORMIN (GLUCOPHAGE) 1000 MG tablet, Take 1,000 mg by mouth 2 (two) times daily with a meal., Disp: , Rfl:  .  metoprolol tartrate (LOPRESSOR) 25 MG tablet, Take 0.5  tablets (12.5 mg total) by mouth 2 (two) times daily., Disp: 30 tablet, Rfl: 1 .  nitroGLYCERIN (NITROSTAT) 0.4 MG SL tablet, as needed., Disp: , Rfl:  .  pioglitazone (ACTOS) 45 MG tablet, Take 1 tablet (45 mg total) by mouth daily., Disp: 1 tablet, Rfl: 1  Past Medical History: Past Medical History:  Diagnosis Date  . History of gout    "controlled w/daily RX:" (09/26/2016)  . History of hiatal hernia    S/P OR    Tobacco Use: History  Smoking Status  . Former Smoker  . Packs/day: 1.00  . Years: 5.00  . Types: Cigarettes  . Quit date: 08/06/1972  Smokeless Tobacco  . Never Used    Labs: Recent Review Flowsheet Data    Labs for ITP Cardiac and Pulmonary Rehab Latest Ref Rng & Units 09/24/2016 09/24/2016 09/24/2016 09/25/2016 11/14/2016   Cholestrol 100 - 199 mg/dL - - - - -   LDLCALC 0 - 99 mg/dL - - - - -   HDL >39 mg/dL - - - - -   Trlycerides 0 - 149 mg/dL - - - - -   Hemoglobin A1c - - - - - 6.3   PHART 7.350 - 7.450 7.320(L) 7.345(L) - - -   PCO2ART 32.0 - 48.0 mmHg 43.1 40.8 - - -   HCO3 20.0 - 28.0 mmol/L 22.0 22.0 - - -   TCO2  0 - 100 mmol/L _0 -   ACIDBASEDEF 0.0 - 2.0 mmol/L 4.0(H) 3.0(H) - - -   O2SAT % 98.0 96.0 - - -       Exercise Target Goals:    Exercise Program Goal: Individual exercise prescription set with THRR, safety & activity barriers. Participant demonstrates ability to understand and report RPE using BORG scale, to self-measure pulse accurately, and to acknowledge the importance of the exercise prescription.  Exercise Prescription Goal: Starting with aerobic activity 30 plus minutes a day, 3 days per week for initial exercise prescription. Provide home exercise prescription and guidelines that participant acknowledges understanding prior to discharge.  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 HR 118 bpm     Max Ex. BP 132/70     2 Minute Post BP 124/70        Oxygen Initial Assessment:   Oxygen Re-Evaluation:   Oxygen Discharge (Final Oxygen Re-Evaluation):   Initial Exercise Prescription:     Initial Exercise Prescription - 11/10/16 1400      Date of Initial Exercise RX and Referring Provider   Date 11/10/16   Referring Provider End, Harrell Gave MD     Treadmill   MPH 2.6   Grade 1   Minutes 15   METs 3.35     Elliptical   Level 1   Speed 3.1   Minutes 15     T5 Nustep   Level 2   SPM 80   Minutes 15   METs 2.5     Prescription Details   Frequency (times per week) 3   Duration Progress to 45 minutes of aerobic exercise without signs/symptoms of physical distress     Intensity   THRR 40-80% of Max Heartrate 94-129   Ratings of Perceived Exertion 11-13   Perceived Dyspnea 0-4     Progression   Progression Continue to progress workloads to maintain intensity without signs/symptoms of physical distress.     Resistance Training   Training Prescription Yes   Weight 3 lbs   Reps 10-15      Perform Capillary Blood Glucose checks as needed.  Exercise Prescription Changes:     Exercise Prescription Changes    Row Name 11/10/16 1400 11/20/16 1100 11/24/16 0900 12/03/16 1600 12/15/16 1500     Response to Exercise   Blood Pressure (Admit) 126/60 108/56  - 124/72 110/72   Blood Pressure (Exercise) 132/70 152/76  - 150/74 140/80   Blood Pressure (Exit) 124/70 106/56  - 120/72 130/64   Heart Rate (Admit) 58 bpm 55  bpm  -  85 bpm 80 bpm   Heart Rate (Exercise) 118 bpm 91 bpm  - 130 bpm 125 bpm   Heart Rate (Exit) 53 bpm 70 bpm  - 92 bpm 90 bpm   Oxygen Saturation (Admit) 99 %  -  -  -  -   Oxygen Saturation (Exercise) 99 %  -  -  -  -   Rating of Perceived Exertion (Exercise) 8 12  - 12 13   Symptoms _0    Comments walk test results  -  -  -  -   Duration  - Continue with 45 min of aerobic exercise without signs/symptoms of physical distress. Continue with 45 min of aerobic exercise without signs/symptoms of physical distress. Continue with 45 min of aerobic exercise without signs/symptoms of physical distress. Continue with 45 min of aerobic exercise without signs/symptoms of physical distress.   Intensity  - THRR unchanged THRR unchanged THRR unchanged THRR unchanged     Progression   Progression  - Continue to progress workloads to maintain intensity without signs/symptoms of physical distress. Continue to progress workloads to maintain intensity without signs/symptoms of physical distress. Continue to progress workloads to maintain intensity without signs/symptoms of physical distress. Continue to progress workloads to maintain intensity without signs/symptoms of physical distress.   Average METs  - 4.23 4.23 3.87 5.85     Resistance Training   Training Prescription  - Yes Yes Yes Yes   Weight  - 3 lbs 3 lbs 3 lbs 3 lbs   Reps  - 10-15 10-15 10-15 10-15     Interval Training   Interval Training  - No No Yes Yes   Equipment  -  -  - NuStep;Bike NuStep;Bike;Elliptical   Comments  -  -  - 2 min on 30 sec off 2 min on 30 sec off     Bike   Level  - 1.5 1.5 1.8 2.1   Watts  -  -  - 90 102   Minutes  - _1 METs  - 3.86 3.86 5.15 6.4     Elliptical   Level  - _2 Speed  - 3.1 3.1 3.7 4.5   Minutes  - _3 T5 Nustep   Level  - _4 SPM  - 100 100  -  -   Minutes  - _5 METs  - 4.6 4.6 2.9 5.3     Home Exercise Plan   Plans to  continue exercise at  -  - [employer] (comment)  [employer] (comment)  [employer] (comment)  [employer]   Frequency  -  - Add 2 additional days to program exercise sessions. Add 2 additional days to program exercise sessions. Add 2 additional days to program exercise sessions.   Initial Home Exercises Provided  -  - 11/24/16 11/24/16 11/24/16      Exercise Comments:     Exercise Comments    Row Name 11/12/16 [phone] 11/24/16 0841 11/24/16 0901       Exercise Comments  First full day of exercise!  Patient was oriented to gym and equipment including functions, settings, policies, and procedures.  Patient's individual exercise prescription and treatment plan were reviewed.  All starting workloads were established based on the results of the 6 minute walk test done at initial  orientation visit.  The plan for exercise progression was also introduced and progression will be customized based on patient's performance and goals. Reviewed METs average and discussed progression with pt today. Home exercise guidelines were reviewed with patient today. He plans on going to [employer] 2x a week on non-cardiac rehab days to exercise for a total of 5 days per week.         Exercise Goals and Review:     Exercise Goals    Row Name 11/10/16 1445             Exercise Goals   Increase Physical Activity Yes       Intervention Provide advice, education, support and counseling about physical activity/exercise needs.;Develop an individualized exercise prescription for aerobic and resistive training based on initial evaluation findings, risk stratification, comorbidities and participant's personal goals.       Expected Outcomes Achievement of increased cardiorespiratory fitness and enhanced flexibility, muscular endurance and strength shown through measurements of functional capacity and personal statement of participant.       Increase Strength and Stamina Yes        Intervention Provide advice, education, support and counseling about physical activity/exercise needs.;Develop an individualized exercise prescription for aerobic and resistive training based on initial evaluation findings, risk stratification, comorbidities and participant's personal goals.       Expected Outcomes Achievement of increased cardiorespiratory fitness and enhanced flexibility, muscular endurance and strength shown through measurements of functional capacity and personal statement of participant.          Exercise Goals Re-Evaluation :     Exercise Goals Re-Evaluation    Row Name 11/20/16 1124 12/03/16 1608 12/15/16 1447 12/17/16 0950       Exercise Goal Re-Evaluation   Exercise Goals Review Increase Physical Activity;Increase Strenth and Stamina Increase Physical Activity;Increase Strenth and Stamina Increase Physical Activity;Increase Strenth and Stamina Increase Physical Activity;Increase Strenth and Stamina    Comments Stuart Henry is off to a good start in rehab.  At first, he did not like coming to exercise since he has been an avid exerciser all his life, but he starting to see the benefit of coming for education and learning his new limits.  He has been doing well on the equipment and "likes" the challenge of the elliptical as it is his hardest piece.  We will continue to monitor his progression. Stuart Henry has been doing well in rehab.  He continues to make progress on all of his equipment and is now up to level 2 on the elliptical and 90 watts on the bike.  We will continue to monitor his progression. Stuart Henry continues to do well in rehab.  He has increased to level 4 on the elliptical!  He is enjoying the intervals.  We will continue to monitor his progression. Stuart Henry has not been doing his home exercise as he just not feeling like it.  He is still lacking energy overall, but able to get the things done that he wants to do.    Expected Outcomes Short: Begin to increase some of his workloads.   Long: Continue to work on improving his stamina. Short: Continue to work on improving his workloads.  Long: Able to do more back at gym again. Short: Continue to work on pushing with intervals.  Long: Able to get back to gym confidently. Short: Continue to exercise in class to work on stamina.  Long: Return to gym.       Discharge Exercise Prescription (Final Exercise Prescription  Changes):     Exercise Prescription Changes - 12/15/16 1500      Response to Exercise   Blood Pressure (Admit) 110/72   Blood Pressure (Exercise) 140/80   Blood Pressure (Exit) 130/64   Heart Rate (Admit) 80 bpm   Heart Rate (Exercise) 125 bpm   Heart Rate (Exit) 90 bpm   Rating of Perceived Exertion (Exercise) 13   Symptoms none   Duration Continue with 45 min of aerobic exercise without signs/symptoms of physical distress.   Intensity THRR unchanged     Progression   Progression Continue to progress workloads to maintain intensity without signs/symptoms of physical distress.   Average METs 5.85     Resistance Training   Training Prescription Yes   Weight 3 lbs   Reps 10-15     Interval Training   Interval Training Yes   Equipment NuStep;Bike;Elliptical   Comments 2 min on 30 sec off     Bike   Level 2.1   Watts 102   Minutes 15   METs 6.4     Elliptical   Level 4   Speed 4.5   Minutes 15     T5 Nustep   Level 5   Minutes 15   METs 5.3     Home Exercise Plan   Plans to continue exercise at [employer] (comment)  [employer]   Frequency Add 2 additional days to program exercise sessions.   Initial Home Exercises Provided 11/24/16      Nutrition:  Target Goals: Understanding of nutrition guidelines, daily intake of sodium <157m, cholesterol <2051m calories 30% from fat and 7% or less from saturated fats, daily to have 5 or more servings of fruits and vegetables.  Biometrics:     Pre Biometrics - 11/10/16 1445      Pre Biometrics   Height 6' 4.2" (1.935 m)   Weight  205 lb (93 kg)   Waist Circumference 27 inches   Hip Circumference 39.5 inches   Waist to Hip Ratio 0.68 %   BMI (Calculated) 24.9   Single Leg Stand 8.73 seconds       Nutrition Therapy Plan and Nutrition Goals:     Nutrition Therapy & Goals - 12/01/16 1030      Nutrition Therapy   Diet Instructed on a meal plan based on heart healthy and diabetes dietary guidelines.   Drug/Food Interactions Statins/Certain Fruits   Protein (specify units) 8 oz   Fiber 30 grams   Whole Grain Foods 3 servings   Saturated Fats 14 max. grams   Fruits and Vegetables 5 servings/day   Sodium 2000 grams     Personal Nutrition Goals   Nutrition Goal Increase intake of whole grain servings-Refer to list.   Personal Goal #2 Increase fruits and vegetables with goal of minimum of 5 servings daily.   Personal Goal #3 Read labels for saturated fat, trans fat and sodium.     Intervention Plan   Intervention Prescribe, educate and counsel regarding individualized specific dietary modifications aiming towards targeted core components such as weight, hypertension, lipid management, diabetes, heart failure and other comorbidities.;Nutrition handout(s) given to patient.   Expected Outcomes Short Term Goal: A plan has been developed with personal nutrition goals set during dietitian appointment.;Long Term Goal: Adherence to prescribed nutrition plan.      Nutrition Discharge: Rate Your Plate Scores:     Nutrition Assessments - 11/10/16 1424      MEDFICTS Scores   Pre Score 10  Nutrition Goals Re-Evaluation:     Nutrition Goals Re-Evaluation    [place] Name 12/17/16 0957             Goals   Nutrition Goal Increase whole grains, more fruits and vegetables, read labels       Comment Stuart Henry is eating a lot more vegetables and reading labels.  He has not increased his whole grains as he has avoided breads/grains more often. He is focused on low carb diet currently.       Expected Outcome Short:  Continue to eat vegetables with each meal.  Long: Maintain heart healthy diet.          Nutrition Goals Discharge (Final Nutrition Goals Re-Evaluation):     Nutrition Goals Re-Evaluation - 12/17/16 0957      Goals   Nutrition Goal Increase whole grains, more fruits and vegetables, read labels   Comment Stuart Henry is eating a lot more vegetables and reading labels.  He has not increased his whole grains as he has avoided breads/grains more often. He is focused on low carb diet currently.   Expected Outcome Short: Continue to eat vegetables with each meal.  Long: Maintain heart healthy diet.      Psychosocial: Target Goals: Acknowledge presence or absence of significant depression and/or stress, maximize coping skills, provide positive support system. Participant is able to verbalize types and ability to use techniques and skills needed for reducing stress and depression.   Initial Review & Psychosocial Screening:     Initial Psych Review & Screening - 11/10/16 1425      Initial Review   Current issues with None Identified     Family Dynamics   Good Support System? Yes  Wife and friends     Barriers   Psychosocial barriers to participate in program There are no identifiable barriers or psychosocial needs.;The patient should benefit from training in stress management and relaxation.     Screening Interventions   Interventions Encouraged to exercise      Quality of Life Scores:      Quality of Life - 11/10/16 1426      Quality of Life Scores   Health/Function Pre 25.6 %   Socioeconomic Pre 26.29 %   Psych/Spiritual Pre 26.36 %   Family Pre 28.5 %   GLOBAL Pre 26.26 %      PHQ-9: Recent Review Flowsheet Data    Depression screen [facility] 2/9 11/14/2016 11/10/2016 05/13/2016 02/23/2015   Decreased Interest 0 0 0 0   Down, Depressed, Hopeless 0 0 0 0   PHQ - 2 Score 0 0 0 0   Altered sleeping 0 0 - -   Tired, decreased energy 3 1 - -   Change in appetite 0 0 - -   Feeling bad  or failure about yourself  0 0 - -   Trouble concentrating 0 0 - -   Moving slowly or fidgety/restless 0 0 - -   Suicidal thoughts 0 0 - -   PHQ-9 Score 3 1 - -     Interpretation of Total Score  Total Score Depression Severity:  1-4 = Minimal depression, 5-9 = Mild depression, 10-14 = Moderate depression, 15-19 = Moderately severe depression, 20-27 = Severe depression   Psychosocial Evaluation and Intervention:     Psychosocial Evaluation - 11/12/16 0939      Psychosocial Evaluation & Interventions   Interventions Encouraged to exercise with the program and follow exercise prescription;Stress management education   Comments Counselor met  with Mr. Jurewicz [facility]) today for initial psychosocial evaluation.  He is a 76 year old who had a CABGx4 on 2/21.  Stuart Henry has a strong support system with a spouse of 18 years.  He also has (2) siblings who live close by and many close friends.  He also struggles with diabetes.  Stuart Henry states he always slept well prior to the surgery in February.  Now he is up and down going to the bathroom all night, but still reports he sleeps at least 6 hours each night.  He states his appetite is improving recently as well.  Stuart Henry denies a history of depression or anxiety or any current symptoms and is typically in a positive mood.  He states his health is his current primary stressor; since he is "confused" on why he has always been extremely active with golf and biking, etc., and no family history of heart disease; and yet he had quadruple blockages.  His goals for this program are to learn to eat healthier and to "make this program worthwhile."  Following this program, he will continue to walk and exercise consistently since this has been part of his normal lifestyle.  Staff will follow with Stuart Henry throughout the course of this program.   Expected Outcomes Stuart Henry will exercise consistently and hopefully begin to sleep better and regain his strength and stamina.  He will benefit from  attendance and participation in the psychoeducational components of this program to help with coping with his current health concerns.  Stuart Henry will meet with the dietician to help with his healthier eating goals.     Continue Psychosocial Services  Follow up required by staff      Psychosocial Re-Evaluation:     Psychosocial Re-Evaluation    [place] Name 12/17/16 [phone]             Psychosocial Re-Evaluation   Current issues with Current Stress Concerns       Comments Stuart Henry has been doing well in rehab.  He is not "stressed" and generally has a positive attitude.  He is sleeping better now.  His biggest concern is that he has lost faith in his belief that exercise is good for him and healthy.  Since, his heart event, he has been questioning the effectiveness of exercise. He hopes to return to that belief that is is helpful.  But he will continue to exercise daily as that is what he has always done and he enjoys it.       Expected Outcomes Short: Continue to attend classes for camraderie and exercise.  Long: Work on building his faith back up        Interventions Stress management education;Encouraged to attend Cardiac Rehabilitation for the exercise       Continue Psychosocial Services  Follow up required by staff          Psychosocial Discharge (Final Psychosocial Re-Evaluation):     Psychosocial Re-Evaluation - 12/17/16 0959      Psychosocial Re-Evaluation   Current issues with Current Stress Concerns   Comments Stuart Henry has been doing well in rehab.  He is not "stressed" and generally has a positive attitude.  He is sleeping better now.  His biggest concern is that he has lost faith in his belief that exercise is good for him and healthy.  Since, his heart event, he has been questioning the effectiveness of exercise. He hopes to return to that belief that is is helpful.  But he will continue to exercise daily  as that is what he has always done and he enjoys it.   Expected Outcomes Short:  Continue to attend classes for camraderie and exercise.  Long: Work on building his faith back up    Interventions Stress management education;Encouraged to attend Cardiac Rehabilitation for the exercise   Continue Psychosocial Services  Follow up required by staff      Vocational Rehabilitation: Provide vocational rehab assistance to qualifying candidates.   Vocational Rehab Evaluation & Intervention:     Vocational Rehab - 11/10/16 1428      Initial Vocational Rehab Evaluation & Intervention   Assessment shows need for Vocational Rehabilitation No      Education: Education Goals: Education classes will be provided on a weekly basis, covering required topics. Participant will state understanding/return demonstration of topics presented.  Learning Barriers/Preferences:     Learning Barriers/Preferences - 11/10/16 1428      Learning Barriers/Preferences   Learning Barriers None   Learning Preferences None      Education Topics: General Nutrition Guidelines/Fats and Fiber: -Group instruction provided by verbal, written material, models and posters to present the general guidelines for heart healthy nutrition. Gives an explanation and review of dietary fats and fiber.   Cardiac Rehab from 12/22/2016 in [facility] Cardiac and Pulmonary Rehab  Date  12/01/16  Educator  CR  Instruction Review Code  2- meets goals/outcomes      Controlling Sodium/Reading Food Labels: -Group verbal and written material supporting the discussion of sodium use in heart healthy nutrition. Review and explanation with models, verbal and written materials for utilization of the food label.   Cardiac Rehab from 12/22/2016 in [facility] Cardiac and Pulmonary Rehab  Date  12/09/16  Educator  PI  Instruction Review Code  2- meets goals/outcomes      Exercise Physiology & Risk Factors: - Group verbal and written instruction with models to review the exercise physiology of the cardiovascular system and associated  critical values. Details cardiovascular disease risk factors and the goals associated with each risk factor.   Cardiac Rehab from 12/22/2016 in [facility] Cardiac and Pulmonary Rehab  Date  12/15/16  Educator  [facility]  Instruction Review Code  2- meets goals/outcomes      Aerobic Exercise & Resistance Training: - Gives group verbal and written discussion on the health impact of inactivity. On the components of aerobic and resistive training programs and the benefits of this training and how to safely progress through these programs.   Cardiac Rehab from 12/22/2016 in [facility] Cardiac and Pulmonary Rehab  Date  12/17/16  Educator  [facility]  Instruction Review Code  2- meets goals/outcomes      Flexibility, Balance, General Exercise Guidelines: - Provides group verbal and written instruction on the benefits of flexibility and balance training programs. Provides general exercise guidelines with specific guidelines to those with heart or lung disease. Demonstration and skill practice provided.   Cardiac Rehab from 12/22/2016 in [facility] Cardiac and Pulmonary Rehab  Date  12/22/16  Educator  [facility]  Instruction Review Code  2- meets goals/outcomes      Stress Management: - Provides group verbal and written instruction about the health risks of elevated stress, cause of high stress, and healthy ways to reduce stress.   Depression: - Provides group verbal and written instruction on the correlation between heart/lung disease and depressed mood, treatment options, and the stigmas associated with seeking treatment.   Cardiac Rehab from 12/22/2016 in [facility] Cardiac and Pulmonary Rehab  Date  12/03/16  Educator  [facility]  Instruction Review Code  2- meets goals/outcomes      Anatomy & Physiology of the Heart: - Group verbal and written instruction and models provide basic cardiac anatomy and physiology, with the coronary electrical and arterial systems. Review of: AMI, Angina, Valve disease, Heart Failure, Cardiac Arrhythmia,  Pacemakers, and the ICD.   Cardiac Rehab from 12/22/2016 in [facility] Cardiac and Pulmonary Rehab  Date  12/10/16  Educator  MA  Instruction Review Code  2- meets goals/outcomes      Cardiac Procedures: - Group verbal and written instruction and models to describe the testing methods done to diagnose heart disease. Reviews the outcomes of the test results. Describes the treatment choices: Medical Management, Angioplasty, or Coronary Bypass Surgery.   Cardiac Medications: - Group verbal and written instruction to review commonly prescribed medications for heart disease. Reviews the medication, class of the drug, and side effects. Includes the steps to properly store meds and maintain the prescription regimen.   Cardiac Rehab from 12/22/2016 in [facility] Cardiac and Pulmonary Rehab  Date  11/19/16 Alberteen Sam 2]  Educator  SB  Instruction Review Code  1- partially meets, needs review/practice [part one]      Go Sex-Intimacy & Heart Disease, Get SMART - Goal Setting: - Group verbal and written instruction through game format to discuss heart disease and the return to sexual intimacy. Provides group verbal and written material to discuss and apply goal setting through the application of the S.M.A.R.T. Method.   Other Matters of the Heart: - Provides group verbal, written materials and models to describe Heart Failure, Angina, Valve Disease, and Diabetes in the realm of heart disease. Includes description of the disease process and treatment options available to the cardiac patient.   Exercise & Equipment Safety: - Individual verbal instruction and demonstration of equipment use and safety with use of the equipment.   Cardiac Rehab from 12/22/2016 in [facility] Cardiac and Pulmonary Rehab  Date  11/10/16  Educator  Sb  Instruction Review Code  2- meets goals/outcomes      Infection Prevention: - Provides verbal and written material to individual with discussion of infection control including proper hand  washing and proper equipment cleaning during exercise session.   Cardiac Rehab from 12/22/2016 in [facility] Cardiac and Pulmonary Rehab  Date  11/10/16  Educator  SB  Instruction Review Code  2- meets goals/outcomes      Falls Prevention: - Provides verbal and written material to individual with discussion of falls prevention and safety.   Cardiac Rehab from 12/22/2016 in [facility] Cardiac and Pulmonary Rehab  Date  11/10/16  Educator  SB  Instruction Review Code  2- meets goals/outcomes      Diabetes: - Individual verbal and written instruction to review signs/symptoms of diabetes, desired ranges of glucose level fasting, after meals and with exercise. Advice that pre and post exercise glucose checks will be done for 3 sessions at entry of program.   Cardiac Rehab from 12/22/2016 in [facility] Cardiac and Pulmonary Rehab  Date  11/10/16  Educator  SB  Instruction Review Code  2- meets goals/outcomes       Knowledge Questionnaire Score:     Knowledge Questionnaire Score - 11/19/16 1510      Knowledge Questionnaire Score   Pre Score 22/28  reviewed test with pt      Core Components/Risk Factors/Patient Goals at Admission:     Personal Goals and Risk Factors at Admission - 11/10/16 1428      Core  Components/Risk Factors/Patient Goals on Admission    Weight Management Yes;Weight Loss   Intervention Weight Management: Develop a combined nutrition and exercise program designed to reach desired caloric intake, while maintaining appropriate intake of nutrient and fiber, sodium and fats, and appropriate energy expenditure required for the weight goal.;Weight Management: Provide education and appropriate resources to help participant work on and attain dietary goals.   Admit Weight 205 lb (93 kg)   Goal Weight: Short Term 203 lb (92.1 kg)   Goal Weight: Long Term 200 lb (90.7 kg)   Expected Outcomes Long Term: Adherence to nutrition and physical activity/exercise program aimed toward attainment  of established weight goal;Short Term: Continue to assess and modify interventions until short term weight is achieved;Weight Maintenance: Understanding of the daily nutrition guidelines, which includes 25-35% calories from fat, 7% or less cal from saturated fats, less than '200mg'$  cholesterol, less than 1.5gm of sodium, & 5 or more servings of fruits and vegetables daily   Diabetes Yes  LAst A1C over 9%, has changed nutriton habits significantly since. FBA are 80s now were over 110   Intervention Provide education about signs/symptoms and action to take for hypo/hyperglycemia.;Provide education about proper nutrition, including hydration, and aerobic/resistive exercise prescription along with prescribed medications to achieve blood glucose in normal ranges: Fasting glucose 65-99 mg/dL   Expected Outcomes Short Term: Participant verbalizes understanding of the signs/symptoms and immediate care of hyper/hypoglycemia, proper foot care and importance of medication, aerobic/resistive exercise and nutrition plan for blood glucose control.;Long Term: Attainment of HbA1C < 7%.   Hypertension Yes   Intervention Provide education on lifestyle modifcations including regular physical activity/exercise, weight management, moderate sodium restriction and increased consumption of fresh fruit, vegetables, and low fat dairy, alcohol moderation, and smoking cessation.;Monitor prescription use compliance.   Expected Outcomes Short Term: Continued assessment and intervention until BP is < 140/49m HG in hypertensive participants. < 130/845mHG in hypertensive participants with diabetes, heart failure or chronic kidney disease.;Long Term: Maintenance of blood pressure at goal levels.   Lipids Yes   Intervention Provide education and support for participant on nutrition & aerobic/resistive exercise along with prescribed medications to achieve LDL '70mg'$ , HDL >'40mg'$ .   Expected Outcomes Short Term: Participant states understanding  of desired cholesterol values and is compliant with medications prescribed. Participant is following exercise prescription and nutrition guidelines.;Long Term: Cholesterol controlled with medications as prescribed, with individualized exercise RX and with personalized nutrition plan. Value goals: LDL < '70mg'$ , HDL > 40 mg.      Core Components/Risk Factors/Patient Goals Review:      Goals and Risk Factor Review    Row Name 12/17/16 [phone]           Core Components/Risk Factors/Patient Goals Review   Personal Goals Review Weight Management/Obesity;Hypertension;Lipids;Diabetes       Review DiBarbarann Ehlersas been doing well in rehab.  He is still feeling like his energy levels have not completely returned yet.  His weight has been steady.  His blood pressures and blood sugars have all done very well in rehab.  His A1C is down to 6.3!  He is tolerating his statin well this time around .       Expected Outcomes Short: Continue to keep eye on blood sugars.  Long: Continue to work on risk factor modification          Core Components/Risk Factors/Patient Goals at Discharge (Final Review):      Goals and Risk Factor Review - 12/17/16 [id number]  Core Components/Risk Factors/Patient Goals Review   Personal Goals Review Weight Management/Obesity;Hypertension;Lipids;Diabetes   Review Stuart Henry has been doing well in rehab.  He is still feeling like his energy levels have not completely returned yet.  His weight has been steady.  His blood pressures and blood sugars have all done very well in rehab.  His A1C is down to 6.3!  He is tolerating his statin well this time around .   Expected Outcomes Short: Continue to keep eye on blood sugars.  Long: Continue to work on risk factor modification      ITP Comments:     ITP Comments    Row Name 11/10/16 1413 11/13/16 1345 11/14/16 0950 11/26/16 0537 12/24/16 0833   ITP Comments Medical review completed today. Initial ITP created.  Documentation of diagnossi can be  found [facility] Encounter 09/22/2016 Called Mr. Ehrler about his concerns with rehab.  Apologized for confusion from first day.  Discussed with him how rehab is a safety net to get him back to where he was before his surgery.  Told him that we like to montior his exercise response with his new medication before he goes back at it on his own.  Also allows him to test his limits in a sae environment.  We talked some about the education side of rehab as well to learn more about his disease process and what it means going forward.  We discussed his goal of getting back to the gym regularlly and getting back out on his bike once cleared and how we can work towards these goals in rehab.  He voiced understanding to where we were coming from.  Mr. Wesby seemed more open to rehab today.  We tried to take our time and explain each step of our process today.  He rather enjoyed learning about the [employer] that Kayleen Memos presented today after questions from the group earlier this week.  We tried out the bike instead of the treadmill today and he said he got a good workout on  It. He also did the elliptical again today with good success despite not being his favorite piece of equipment.  After class I asked if things had gone better today and he said they did. 30 day review. Continue with ITP unless directed changes per Medical Director review 30 day review. Continue with ITP unless directed changes per Medical Director review      Comments:

## 2016-12-26 ENCOUNTER — Encounter: Payer: Medicare Other | Admitting: *Deleted

## 2016-12-26 DIAGNOSIS — Z951 Presence of aortocoronary bypass graft: Secondary | ICD-10-CM | POA: Diagnosis not present

## 2016-12-26 NOTE — Progress Notes (Signed)
Daily Session Note  Patient Details  Name: Stuart Henry MRN: [id number] Date of Birth: 04-Sep-1940 Referring Provider:     Cardiac Rehab from 11/10/2016 in [facility] Cardiac and Pulmonary Rehab  Referring Provider  End, Harrell Gave MD      Encounter Date: 12/26/2016  Check In:     Session Check In - 12/26/16 7096      Check-In   Location [facility]-Cardiac & Pulmonary Rehab   Staff Present Gerlene Burdock, RN, Levie Heritage, MA, ACSM RCEP, Exercise Physiologist;Krista Frederico Hamman, RN BSN   Supervising physician immediately available to respond to emergencies See telemetry face sheet for immediately available ER MD   Medication changes reported     No   Fall or balance concerns reported    No   Warm-up and Cool-down Performed on first and last piece of equipment   Resistance Training Performed Yes   VAD Patient? No     Pain Assessment   Currently in Pain? No/denies   Multiple Pain Sites No         History  Smoking Status  . Former Smoker  . Packs/day: 1.00  . Years: 5.00  . Types: Cigarettes  . Quit date: 08/06/1972  Smokeless Tobacco  . Never Used    Goals Met:  Independence with exercise equipment Exercise tolerated well No report of cardiac concerns or symptoms Strength training completed today  Goals Unmet:  Not Applicable  Comments: Pt able to follow exercise prescription today without complaint.  Will continue to monitor for progression.    Dr. Emily Filbert is Medical Director for [facility] and [name].

## 2016-12-30 ENCOUNTER — Encounter: Payer: Medicare Other | Admitting: Respiratory Therapy

## 2016-12-30 DIAGNOSIS — Z951 Presence of aortocoronary bypass graft: Secondary | ICD-10-CM

## 2016-12-30 NOTE — Progress Notes (Signed)
Daily Session Note  Patient Details  Name: QUANTAVIOUS EGGERT MRN: [id number] Date of Birth: 10/20/1940 Referring Provider:     Cardiac Rehab from 11/10/2016 in [facility] Cardiac and Pulmonary Rehab  Referring Provider  End, Harrell Gave MD      Encounter Date: 12/30/2016  Check In:     Session Check In - 12/30/16 0905      Check-In   Location [facility]-Cardiac & Pulmonary Rehab   Staff Present Alberteen Sam, MA, ACSM RCEP, Exercise Physiologist;Krista Frederico Hamman, RN BSN;Laureen Janell Quiet, RRT, Respiratory Therapist   Supervising physician immediately available to respond to emergencies See telemetry face sheet for immediately available ER MD   Medication changes reported     No   Fall or balance concerns reported    No   Tobacco Cessation No Change   Warm-up and Cool-down Performed on first and last piece of equipment   Resistance Training Performed Yes   VAD Patient? No     Pain Assessment   Currently in Pain? No/denies   Multiple Pain Sites No         History  Smoking Status   Former Smoker   Packs/day: 1.00   Years: 5.00   Types: Cigarettes   Quit date: 08/06/1972  Smokeless Tobacco   Never Used    Goals Met:  Proper associated with RPD/PD & O2 Sat Independence with exercise equipment Exercise tolerated well No report of cardiac concerns or symptoms Strength training completed today  Goals Unmet:  Not Applicable  Comments: Pt able to follow exercise prescription today without complaint.  Will continue to monitor for progression.   Dr. Emily Filbert is Medical Director for [place] and [name].

## 2016-12-31 ENCOUNTER — Encounter: Payer: Medicare Other | Admitting: *Deleted

## 2016-12-31 DIAGNOSIS — Z951 Presence of aortocoronary bypass graft: Secondary | ICD-10-CM | POA: Diagnosis not present

## 2016-12-31 NOTE — Progress Notes (Signed)
Daily Session Note  Patient Details  Name: Stuart Henry MRN: [id number] Date of Birth: 03/01/1941 Referring Provider:     Cardiac Rehab from 11/10/2016 in [facility] Cardiac and Pulmonary Rehab  Referring Provider  End, Christopher MD      Encounter Date: 12/31/2016  Check In:     Session Check In - 12/31/16 0852      Check-In   Location [facility]-Cardiac & Pulmonary Rehab   Staff Present Amanda Sommer, BA, ACSM CEP, Exercise Physiologist;Jessica Hawkins, MA, ACSM RCEP, Exercise Physiologist;Krista Spencer, RN BSN   Supervising physician immediately available to respond to emergencies See telemetry face sheet for immediately available ER MD   Medication changes reported     No   Fall or balance concerns reported    No   Warm-up and Cool-down Performed on first and last piece of equipment   Resistance Training Performed Yes   VAD Patient? No     Pain Assessment   Currently in Pain? No/denies   Multiple Pain Sites No         History  Smoking Status  . Former Smoker  . Packs/day: 1.00  . Years: 5.00  . Types: Cigarettes  . Quit date: 08/06/1972  Smokeless Tobacco  . Never Used    Goals Met:  Independence with exercise equipment Exercise tolerated well No report of cardiac concerns or symptoms Strength training completed today  Goals Unmet:  Not Applicable  Comments: Pt able to follow exercise prescription today without complaint.  Will continue to monitor for progression.    Dr. Mark Miller is Medical Director for [facility] and [name]. 

## 2017-01-01 ENCOUNTER — Encounter: Payer: Self-pay | Admitting: Internal Medicine

## 2017-01-01 ENCOUNTER — Ambulatory Visit (INDEPENDENT_AMBULATORY_CARE_PROVIDER_SITE_OTHER): Payer: Medicare Other | Admitting: Internal Medicine

## 2017-01-01 VITALS — BP 140/88 | HR 69 | Ht 76.0 in | Wt 207.0 lb

## 2017-01-01 DIAGNOSIS — I2510 Atherosclerotic heart disease of native coronary artery without angina pectoris: Secondary | ICD-10-CM | POA: Diagnosis not present

## 2017-01-01 DIAGNOSIS — Z79899 Other long term (current) drug therapy: Secondary | ICD-10-CM | POA: Diagnosis not present

## 2017-01-01 DIAGNOSIS — I10 Essential (primary) hypertension: Secondary | ICD-10-CM

## 2017-01-01 DIAGNOSIS — E782 Mixed hyperlipidemia: Secondary | ICD-10-CM | POA: Diagnosis not present

## 2017-01-01 DIAGNOSIS — I255 Ischemic cardiomyopathy: Secondary | ICD-10-CM

## 2017-01-01 DIAGNOSIS — I480 Paroxysmal atrial fibrillation: Secondary | ICD-10-CM | POA: Diagnosis not present

## 2017-01-01 MED ORDER — ASPIRIN EC 81 MG PO TBEC: 81.0000 mg | DELAYED_RELEASE_TABLET | Freq: Every day | ORAL | 3 refills | Status: AC

## 2017-01-01 MED ORDER — CARVEDILOL 3.125 MG PO TABS: 3.1250 mg | ORAL_TABLET | Freq: Two times a day (BID) | ORAL | 3 refills | Status: DC

## 2017-01-01 NOTE — Progress Notes (Signed)
Follow-up Outpatient Visit  Date: 01/01/2017  Primary Care Provider: Birdie Sons, MD [address] [place] [address]  Chief Complaint: Follow-up coronary artery disease status post CABG, cardiomyopathy, and postoperative atrial fibrillation  HPI:  Stuart Henry is a 75 y.o. year-old male with history of coronary artery disease with progressive angina in early 2018 status post CABG (04/7415) complicated by postoperative atrial fibrillation, ischemic cardiomyopathy, type 2 diabetes mellitus, and hypertension, who presents for follow-up of CAD, cardiomyopathy, and paroxysmal atrial fibrillation. I last saw him on 10/08/16, at which time he was recovering well from his CABG. His only complaint at that time was soreness surrounding his sternotomy. We decreased amiodarone at that time. Stuart Henry has been participating in cardiac rehabilitation since early April.  Today, Stuart Henry reports feeling well. He would like to discontinue apixaban and amiodarone, if possible. He has not had any chest pain or shortness of breath. He notices some skin sensitivity at his sternotomy site, though this continues to improve. He is actively participating in cardiac rehabilitation. He reports feeling better than he has been a long time. He has not had any significant edema. He denies orthopnea, PND, palpitations, lightheadedness, and claudication. Stuart Henry would like to begin weight lifting, including bench pressing, as well as biking but has not started this in light of his CABG in February. He also would like to begin cycling again.  --------------------------------------------------------------------------------------------------  Cardiovascular History & Procedures: Cardiovascular Problems:  Coronary artery disease status post CABG (09/2016)  Ischemic cardiomyopathy  Postoperative atrial fibrillation  Risk Factors:  Known coronary artery disease, diabetes mellitus, hypertension, age  greater than 56, and male gender  Cath/PCI:  LHC (09/16/16): Significant two-vessel CAD with 95% proximal LAD stenosis involving ostium of D1, 90% ostial ramus intermedius stenosis, and at least 50% lesions involving the ostium and mid section of the LCx. RCA with 25% distal stenosis. LVEF 55-60% with normal LVEDP.  CV Surgery:  CABG (09/24/16, Dr. Roxan Hockey): LIMA to LAD, SVG to D1, and sequential SVG to first and second branches of ramus intermedius.  EP Procedures and Devices:  None  Non-Invasive Evaluation(s):  Carotid Doppler (09/23/16): Mild (1-39%) stenoses involving the left and right internal carotid arteries. Vertebral arteries with antegrade flow.  ABIs (09/23/16): Right 1.98 and the left 1.49 suggestive of medial calcification.  Transthoracic echocardiogram (09/19/16): Normal LV size with mildly reduced contraction (EF 45-50%) with basal and mid anterior and anteroseptal hypokinesis. Grade 1 diastolic dysfunction. Mildly dilated left atrium. Normal RV size and function.  Recent CV Pertinent Labs: Lab Results  Component Value Date   CHOL 179 03/28/2016   HDL 33 (L) 03/28/2016   LDLCALC 97 03/28/2016   TRIG 246 (H) 03/28/2016   CHOLHDL 5.4 (H) 03/28/2016   INR 1.17 09/29/2016   K 4.3 09/27/2016   MG 1.9 09/25/2016   BUN 17 09/27/2016   BUN 27 09/10/2016   CREATININE 1.17 09/27/2016    Past medical and surgical history were reviewed and updated in EPIC.  Outpatient Encounter Prescriptions as of 01/01/2017  Medication Sig  . ACCU-CHEK AVIVA PLUS test strip Check blood sugar once  daily  . allopurinol (ZYLOPRIM) 100 MG tablet TAKE 1 TABLET DAILY  . amiodarone (PACERONE) 200 MG tablet Take 1 tablet (200 mg total) by mouth daily.  Marland Kitchen apixaban (ELIQUIS) 5 MG TABS tablet Take 1 tablet (5 mg total) by mouth 2 (two) times daily.  Marland Kitchen atorvastatin (LIPITOR) 40 MG tablet Take 1 tablet (40 mg total)  by mouth daily. (Patient taking differently: Take 40 mg by mouth at bedtime.  )  . Blood Glucose Monitoring Suppl (ACCU-CHEK AVIVA PLUS) w/Device KIT Use to check blood sugar daily as directed  . empagliflozin (JARDIANCE) 25 MG TABS tablet Take 12.5 mg by mouth daily.  . fluticasone (FLONASE) 50 MCG/ACT nasal spray Place 2 sprays into both nostrils daily. (Patient taking differently: Place 2 sprays into both nostrils daily as needed for allergies. )  . gabapentin (NEURONTIN) 300 MG capsule TAKE ONE CAPSULE BY MOUTH AT BEDTIME FOR NEUROPATHY  . glipiZIDE (GLUCOTROL XL) 10 MG 24 hr tablet 1/2 tablet daily  . metFORMIN (GLUCOPHAGE) 1000 MG tablet Take 1,000 mg by mouth 2 (two) times daily with a meal.  . nitroGLYCERIN (NITROSTAT) 0.4 MG SL tablet as needed.  . pioglitazone (ACTOS) 45 MG tablet Take 1 tablet (45 mg total) by mouth daily.  . [DISCONTINUED] metoprolol tartrate (LOPRESSOR) 25 MG tablet Take 0.5 tablets (12.5 mg total) by mouth 2 (two) times daily. (Patient not taking: Reported on 01/01/2017)   No facility-administered encounter medications on file as of 01/01/2017.     Allergies: No known allergies  Social History   Social History  . Marital status: Married    Spouse name: N/A  . Number of children: N/A  . Years of education: N/A   Occupational History  . Retired    Social History Main Topics  . Smoking status: Former Smoker    Packs/day: 1.00    Years: 5.00    Types: Cigarettes    Quit date: 08/06/1972  . Smokeless tobacco: Never Used  . Alcohol use No     Comment: 09/26/2016 "quit 01/30/12"  . Drug use: No  . Sexual activity: No   Other Topics Concern  . Not on file   Social History Narrative  . No narrative on file    Family History  Problem Relation Age of Onset  . Dementia Mother   . Diabetes Mother   . Cancer Father   . Diabetes Sister   . Diabetes Brother   . Heart disease Other     Review of Systems: A 12-system review of systems was performed and was negative except as noted in the  HPI.  --------------------------------------------------------------------------------------------------  Physical Exam: BP 140/88 (BP Location: Left Arm, Patient Position: Sitting, Cuff Size: Normal)   Pulse 69   Ht '6\' 4"'  (1.93 m)   Wt 207 lb (93.9 kg)   BMI 25.20 kg/m   General:  Well-developed, well-nourished man, seated comfortably in the exam room. He is accompanied by his wife. HEENT: No conjunctival pallor or scleral icterus.  Moist mucous membranes.  OP clear. Neck: Supple without lymphadenopathy, thyromegaly, JVD, or HJR. Lungs: Normal work of breathing.  Clear to auscultation bilaterally without wheezes or crackles. Heart: Regular rate and rhythm without murmurs, rubs, or gallops.  Non-displaced PMI. Median sternotomy is well-healed. Abd: Bowel sounds present.  Soft, NT/ND without hepatosplenomegaly Ext: No lower extremity edema.  Radial, PT, and DP pulses are 2+ bilaterally. Skin: warm and dry without rash  EKG:  Normal sinus rhythm with possible old inferior MI. No change from prior tracing on 10/08/16.  Lab Results  Component Value Date   WBC 6.8 09/27/2016   HGB 10.4 (L) 09/27/2016   HCT 31.4 (L) 09/27/2016   MCV 88.0 09/27/2016   PLT 123 (L) 09/27/2016    Lab Results  Component Value Date   NA 135 09/27/2016   K 4.3 09/27/2016   CL  98 (L) 09/27/2016   CO2 26 09/27/2016   BUN 17 09/27/2016   CREATININE 1.17 09/27/2016   GLUCOSE 113 (H) 09/27/2016   ALT 88 (H) 09/23/2016    Lab Results  Component Value Date   CHOL 179 03/28/2016   HDL 33 (L) 03/28/2016   LDLCALC 97 03/28/2016   TRIG 246 (H) 03/28/2016   CHOLHDL 5.4 (H) 03/28/2016   --------------------------------------------------------------------------------------------------  ASSESSMENT AND PLAN: Coronary artery disease without angina Stuart Henry is recovering quite well from his CABG in February. He is exercising regularly and has almost completed cardiac rehabilitation. We will discontinue  apixaban and amiodarone today (see details below). In lieu of this, we will start aspirin 81 mg daily and carvedilol 3.125 mg twice a day. I encouraged Stuart Henry to continue with exercise once he has completed cardiac rehabilitation. I am hesitant to have him start an aggressive weightlifting regimen, given that he is just 3 months from median sternotomy. I encouraged him to contact Dr. Leonarda Henry office for further guidance about when to begin lifting weights; I would prefer waiting at least 6 months from surgery.  Ischemic cardiomyopathy LVEF noted to be mildlyreduced by echo prior to CABG but normal on intraoperative TEE. He is doing well without heart failure symptoms or evidence of volume overload today. We will start carvedilol 3.125 mg twice a day today. I will defer adding an ACE inhibitor at this time.  Postoperative atrial fibrillation Hospitalization at the time of CABG complicated by postop atrial fibrillation. Stuart Henry was discharged on amiodarone and apixaban. He has not had palpitations to suggest recurrence. EKG today demonstrates sinus rhythm. We have agreed to discontinue apixaban and amiodarone. In place of these medicines, we will start aspirin 81 mg daily and carvedilol 3.125 mg twice a day.  Hypertension Blood pressure is mildly elevated today. As above, we will start carvedilol. Long-term, addition of an ACE inhibitor or ARB would be prudent, given history of mild reduced LVEF and diabetes. We will defer this to follow-up.  Hyperlipidemia LDL noted to be mildly elevated when last checked in January. Patient is tolerating high intensity statin therapy well. We will recheck a lipid panel and ALT today.  Follow-up: Return to clinic in 3 months.  Nelva Bush, MD 01/02/2017 7:25 AM

## 2017-01-01 NOTE — Patient Instructions (Signed)
Medication Instructions:  Your physician has recommended you make the following change in your medication:  1- STOP Amiodarone. 2- STOP Eliquis. 3- START Carvedilol 3.125 mg (1 tablet) by mouth two times a day.   Labwork: Your physician recommends that you return for lab work in: TODAY (LIPID, ALT).    Testing/Procedures: none  Follow-Up: Your physician recommends that you schedule a follow-up appointment in: 3 MONTHS WITH DR END.   If you need a refill on your cardiac medications before your next appointment, please call your pharmacy.

## 2017-01-02 ENCOUNTER — Encounter: Payer: Medicare Other | Attending: Thoracic Surgery (Cardiothoracic Vascular Surgery) | Admitting: *Deleted

## 2017-01-02 ENCOUNTER — Encounter: Payer: Self-pay | Admitting: Internal Medicine

## 2017-01-02 DIAGNOSIS — I255 Ischemic cardiomyopathy: Secondary | ICD-10-CM | POA: Insufficient documentation

## 2017-01-02 DIAGNOSIS — Z951 Presence of aortocoronary bypass graft: Secondary | ICD-10-CM | POA: Diagnosis present

## 2017-01-02 LAB — LIPID PANEL
Chol/HDL Ratio: 2.6 ratio (ref 0.0–5.0)
Cholesterol, Total: 99 mg/dL — ABNORMAL LOW (ref 100–199)
HDL: 38 mg/dL — ABNORMAL LOW (ref >39)
LDL CALC: 40 mg/dL (ref 0–99)
Triglycerides: 103 mg/dL (ref 0–149)
VLDL CHOLESTEROL CAL: 21 mg/dL (ref 5–40)

## 2017-01-02 LAB — ALT: ALT: 27 IU/L (ref 0–44)

## 2017-01-02 NOTE — Progress Notes (Signed)
Daily Session Note  Patient Details  Name: Stuart Henry MRN: [id number] Date of Birth: 07/11/1941 Referring Provider:     Cardiac Rehab from 11/10/2016 in [facility] Cardiac [facility]  Referring Provider  End, Harrell Gave MD      Encounter Date: 01/02/2017  Check In:     Session Check In - 01/02/17 0847      Check-In   Location [facility]-Cardiac & Pulmonary Rehab   Staff Present Heath Lark, RN, BSN, CCRP;Jessica Luan Pulling, MA, ACSM RCEP, Exercise Physiologist;Verdun Rackley, RN, BSN   Supervising physician immediately available to respond to emergencies See telemetry face sheet for immediately available ER MD   Medication changes reported     No   Fall or balance concerns reported    No   Tobacco Cessation No Change   Warm-up and Cool-down Performed on first and last piece of equipment   Resistance Training Performed Yes   VAD Patient? No     Pain Assessment   Currently in Pain? No/denies         History  Smoking Status  . Former Smoker  . Packs/day: 1.00  . Years: 5.00  . Types: Cigarettes  . Quit date: 08/06/1972  Smokeless Tobacco  . Never Used    Goals Met:  Proper associated with RPD/PD & O2 Sat Exercise tolerated well No report of cardiac concerns or symptoms  Goals Unmet:  Not Applicable  Comments:     Dr. Emily Filbert is Medical Director for [facility] and [name].

## 2017-01-03 NOTE — Addendum Note (Signed)
Addendum  created 01/03/17 6073 by Duane Boston, MD   Sign clinical note

## 2017-01-05 ENCOUNTER — Encounter: Payer: Medicare Other | Admitting: *Deleted

## 2017-01-05 DIAGNOSIS — Z951 Presence of aortocoronary bypass graft: Secondary | ICD-10-CM

## 2017-01-05 NOTE — Patient Instructions (Signed)
Discharge Instructions  Patient Details  Name: Stuart Henry MRN: [id number] Date of Birth: Jul 05, 1941 Referring Provider:  Melrose Nakayama, *   Number of Visits: 36/36  Reason for Discharge:  Patient reached a stable level of exercise. Patient independent in their exercise.  Smoking History:  History  Smoking Status  . Former Smoker  . Packs/day: 1.00  . Years: 5.00  . Types: Cigarettes  . Quit date: 08/06/1972  Smokeless Tobacco  . Never Used    Diagnosis:  S/P CABG x 4  Initial Exercise Prescription:     Initial Exercise Prescription - 11/10/16 1400      Date of Initial Exercise RX and Referring Provider   Date 11/10/16   Referring Provider End, Harrell Gave MD     Treadmill   MPH 2.6   Grade 1   Minutes 15   METs 3.35     Elliptical   Level 1   Speed 3.1   Minutes 15     T5 Nustep   Level 2   SPM 80   Minutes 15   METs 2.5     Prescription Details   Frequency (times per week) 3   Duration Progress to 45 minutes of aerobic exercise without signs/symptoms of physical distress     Intensity   THRR 40-80% of Max Heartrate 94-129   Ratings of Perceived Exertion 11-13   Perceived Dyspnea 0-4     Progression   Progression Continue to progress workloads to maintain intensity without signs/symptoms of physical distress.     Resistance Training   Training Prescription Yes   Weight 3 lbs   Reps 10-15      Discharge Exercise Prescription (Final Exercise Prescription Changes):     Exercise Prescription Changes - 12/31/16 1400      Response to Exercise   Blood Pressure (Admit) 122/56   Blood Pressure (Exercise) 164/78   Blood Pressure (Exit) 126/70   Heart Rate (Admit) 65 bpm   Heart Rate (Exercise) 124 bpm   Heart Rate (Exit) 70 bpm   Rating of Perceived Exertion (Exercise) 13   Symptoms none   Duration Continue with 45 min of aerobic exercise without signs/symptoms of physical distress.   Intensity THRR unchanged     Progression    Progression Continue to progress workloads to maintain intensity without signs/symptoms of physical distress.   Average METs 4.42     Resistance Training   Training Prescription Yes   Weight 7 lbs   Reps 10-15     Interval Training   Interval Training Yes   Equipment NuStep;Bike;Elliptical   Comments 2 min on 30 sec off     Bike   Watts 108  avg (interval 80W-160W)   Minutes 15   METs 5.38     Elliptical   Level 4   Speed 4.5   Minutes 15     T5 Nustep   Level 5   Minutes 15   METs 3.5     Home Exercise Plan   Plans to continue exercise at [employer] (comment)  [employer]   Frequency Add 2 additional days to program exercise sessions.   Initial Home Exercises Provided 11/24/16      Functional Capacity:     6 Minute Walk    Row Name 11/10/16 1432 01/02/17 1003       6 Minute Walk   Phase Initial Discharge    Distance 1387 feet 2050 feet    Distance % Change  -  47.8 %  663 ft    Walk Time 6 minutes 6 minutes    # of Rest Breaks 0 0    MPH 2.62 3.88    METS 3.4 4.51    RPE 8 13    VO2 Peak 11.91 15.8    Symptoms No No    Resting HR 58 bpm 73 bpm    Resting BP 126/60 128/64    Max Ex. HR 118 bpm 120 bpm    Max Ex. BP 132/70 126/74    2 Minute Post BP 124/70  -       Quality of Life:     Quality of Life - 01/05/17 0758      Quality of Life Scores   Health/Function Pre 25.6 %   Health/Function Post 25.11 %   Health/Function % Change -1.91 %   Socioeconomic Pre 26.29 %   Socioeconomic Post 27 %   Socioeconomic % Change  2.7 %   Psych/Spiritual Pre 26.36 %   Psych/Spiritual Post 27.36 %   Psych/Spiritual % Change 3.79 %   Family Pre 28.5 %   Family Post 25.13 %   Family % Change -11.82 %   GLOBAL Pre 26.26 %   GLOBAL Post 26.02 %   GLOBAL % Change -0.91 %      Personal Goals: Goals established at orientation with interventions provided to work toward goal.     Personal Goals and Risk Factors at Admission - 11/10/16 1428       Core Components/Risk Factors/Patient Goals on Admission    Weight Management Yes;Weight Loss   Intervention Weight Management: Develop a combined nutrition and exercise program designed to reach desired caloric intake, while maintaining appropriate intake of nutrient and fiber, sodium and fats, and appropriate energy expenditure required for the weight goal.;Weight Management: Provide education and appropriate resources to help participant work on and attain dietary goals.   Admit Weight 205 lb (93 kg)   Goal Weight: Short Term 203 lb (92.1 kg)   Goal Weight: Long Term 200 lb (90.7 kg)   Expected Outcomes Long Term: Adherence to nutrition and physical activity/exercise program aimed toward attainment of established weight goal;Short Term: Continue to assess and modify interventions until short term weight is achieved;Weight Maintenance: Understanding of the daily nutrition guidelines, which includes 25-35% calories from fat, 7% or less cal from saturated fats, less than 200mg  cholesterol, less than 1.5gm of sodium, & 5 or more servings of fruits and vegetables daily   Diabetes Yes  LAst A1C over 9%, has changed nutriton habits significantly since. FBA are 80s now were over 110   Intervention Provide education about signs/symptoms and action to take for hypo/hyperglycemia.;Provide education about proper nutrition, including hydration, and aerobic/resistive exercise prescription along with prescribed medications to achieve blood glucose in normal ranges: Fasting glucose 65-99 mg/dL   Expected Outcomes Short Term: Participant verbalizes understanding of the signs/symptoms and immediate care of hyper/hypoglycemia, proper foot care and importance of medication, aerobic/resistive exercise and nutrition plan for blood glucose control.;Long Term: Attainment of HbA1C < 7%.   Hypertension Yes   Intervention Provide education on lifestyle modifcations including regular physical activity/exercise, weight  management, moderate sodium restriction and increased consumption of fresh fruit, vegetables, and low fat dairy, alcohol moderation, and smoking cessation.;Monitor prescription use compliance.   Expected Outcomes Short Term: Continued assessment and intervention until BP is < 140/46mm HG in hypertensive participants. < 130/22mm HG in hypertensive participants with diabetes, heart failure or chronic kidney disease.;Long Term:  Maintenance of blood pressure at goal levels.   Lipids Yes   Intervention Provide education and support for participant on nutrition & aerobic/resistive exercise along with prescribed medications to achieve LDL 70mg , HDL >40mg .   Expected Outcomes Short Term: Participant states understanding of desired cholesterol values and is compliant with medications prescribed. Participant is following exercise prescription and nutrition guidelines.;Long Term: Cholesterol controlled with medications as prescribed, with individualized exercise RX and with personalized nutrition plan. Value goals: LDL < 70mg , HDL > 40 mg.       Personal Goals Discharge:     Goals and Risk Factor Review - 12/17/16 0953      Core Components/Risk Factors/Patient Goals Review   Personal Goals Review Weight Management/Obesity;Hypertension;Lipids;Diabetes   Review Barbarann Ehlers has been doing well in rehab.  He is still feeling like his energy levels have not completely returned yet.  His weight has been steady.  His blood pressures and blood sugars have all done very well in rehab.  His A1C is down to 6.3!  He is tolerating his statin well this time around .   Expected Outcomes Short: Continue to keep eye on blood sugars.  Long: Continue to work on risk factor modification      Nutrition & Weight - Outcomes:     Pre Biometrics - 11/10/16 1445      Pre Biometrics   Height 6' 4.2" (1.935 m)   Weight 205 lb (93 kg)   Waist Circumference 27 inches   Hip Circumference 39.5 inches   Waist to Hip Ratio 0.68 %   BMI  (Calculated) 24.9   Single Leg Stand 8.73 seconds       Nutrition:     Nutrition Therapy & Goals - 12/01/16 1030      Nutrition Therapy   Diet Instructed on a meal plan based on heart healthy and diabetes dietary guidelines.   Drug/Food Interactions Statins/Certain Fruits   Protein (specify units) 8 oz   Fiber 30 grams   Whole Grain Foods 3 servings   Saturated Fats 14 max. grams   Fruits and Vegetables 5 servings/day   Sodium 2000 grams     Personal Nutrition Goals   Nutrition Goal Increase intake of whole grain servings-Refer to list.   Personal Goal #2 Increase fruits and vegetables with goal of minimum of 5 servings daily.   Personal Goal #3 Read labels for saturated fat, trans fat and sodium.     Intervention Plan   Intervention Prescribe, educate and counsel regarding individualized specific dietary modifications aiming towards targeted core components such as weight, hypertension, lipid management, diabetes, heart failure and other comorbidities.;Nutrition handout(s) given to patient.   Expected Outcomes Short Term Goal: A plan has been developed with personal nutrition goals set during dietitian appointment.;Long Term Goal: Adherence to prescribed nutrition plan.      Nutrition Discharge:     Nutrition Assessments - 01/05/17 0757      MEDFICTS Scores   Pre Score 10   Post Score 28   Score Difference 18      Education Questionnaire Score:     Knowledge Questionnaire Score - 01/05/17 0757      Knowledge Questionnaire Score   Pre Score 22/28   Post Score 24/28      Goals reviewed with patient; copy given to patient.

## 2017-01-05 NOTE — Progress Notes (Signed)
Daily Session Note  Patient Details  Name: Stuart Henry MRN: [id number] Date of Birth: [birth year] Referring Provider:     Cardiac Rehab from 11/10/2016 in [facility] Cardiac and Pulmonary Rehab  Referring Provider  End, Harrell Gave MD      Encounter Date: 01/05/2017  Check In:     Session Check In - 01/05/17 0757      Check-In   Location [facility]-Cardiac & Pulmonary Rehab   Staff Present Gerlene Burdock, RN, Levie Heritage, MA, ACSM RCEP, Exercise Physiologist;Kelly Amedeo Plenty, BS, ACSM CEP, Exercise Physiologist   Supervising physician immediately available to respond to emergencies See telemetry face sheet for immediately available ER MD   Medication changes reported     No   Fall or balance concerns reported    No   Warm-up and Cool-down Performed on first and last piece of equipment   Resistance Training Performed Yes   VAD Patient? No     Pain Assessment   Currently in Pain? No/denies   Multiple Pain Sites No         History  Smoking Status  . Former Smoker  . Packs/day: 1.00  . Years: 5.00  . Types: Cigarettes  . Quit date: 08/06/1972  Smokeless Tobacco  . Never Used    Goals Met:  Proper associated with RPD/PD & O2 Sat Exercise tolerated well No report of cardiac concerns or symptoms  Goals Unmet:  Not Applicable  Comments:     Dr. Emily Filbert is Medical Director for [facility] and [name].

## 2017-01-07 DIAGNOSIS — Z951 Presence of aortocoronary bypass graft: Secondary | ICD-10-CM | POA: Diagnosis not present

## 2017-01-07 NOTE — Progress Notes (Signed)
Discharge Summary  Patient Details  Name: Stuart Henry MRN: [id number] Date of Birth: 1941/01/13 Referring Provider:     Cardiac Rehab from 11/10/2016 in [facility]  Referring Provider  End, Harrell Gave MD       Number of Visits:36/36  Reason for Discharge:  Patient reached a stable level of exercise. Patient independent in their exercise.  Smoking History:  History  Smoking Status  . Former Smoker  . Packs/day: 1.00  . Years: 5.00  . Types: Cigarettes  . Quit date: 08/06/1972  Smokeless Tobacco  . Never Used    Diagnosis:  S/P CABG x 4  ADL UCSD:   Initial Exercise Prescription:     Initial Exercise Prescription - 11/10/16 1400      Date of Initial Exercise RX and Referring Provider   Date 11/10/16   Referring Provider End, Harrell Gave MD     Treadmill   MPH 2.6   Grade 1   Minutes 15   METs 3.35     Elliptical   Level 1   Speed 3.1   Minutes 15     T5 Nustep   Level 2   SPM 80   Minutes 15   METs 2.5     Prescription Details   Frequency (times per week) 3   Duration Progress to 45 minutes of aerobic exercise without signs/symptoms of physical distress     Intensity   THRR 40-80% of Max Heartrate 94-129   Ratings of Perceived Exertion 11-13   Perceived Dyspnea 0-4     Progression   Progression Continue to progress workloads to maintain intensity without signs/symptoms of physical distress.     Resistance Training   Training Prescription Yes   Weight 3 lbs   Reps 10-15      Discharge Exercise Prescription (Final Exercise Prescription Changes):     Exercise Prescription Changes - 12/31/16 1400      Response to Exercise   Blood Pressure (Admit) 122/56   Blood Pressure (Exercise) 164/78   Blood Pressure (Exit) 126/70   Heart Rate (Admit) 65 bpm   Heart Rate (Exercise) 124 bpm   Heart Rate (Exit) 70 bpm   Rating of Perceived Exertion (Exercise) 13   Symptoms none   Duration Continue with 45 min of aerobic  exercise without signs/symptoms of physical distress.   Intensity THRR unchanged     Progression   Progression Continue to progress workloads to maintain intensity without signs/symptoms of physical distress.   Average METs 4.42     Resistance Training   Training Prescription Yes   Weight 7 lbs   Reps 10-15     Interval Training   Interval Training Yes   Equipment NuStep;Bike;Elliptical   Comments 2 min on 30 sec off     Bike   Watts 108  avg (interval 80W-160W)   Minutes 15   METs 5.38     Elliptical   Level 4   Speed 4.5   Minutes 15     T5 Nustep   Level 5   Minutes 15   METs 3.5     Home Exercise Plan   Plans to continue exercise at [employer] (comment)  [employer]   Frequency Add 2 additional days to program exercise sessions.   Initial Home Exercises Provided 11/24/16      Functional Capacity:     6 Minute Walk    Row Name 11/10/16 1432 01/02/17 1003       6  Minute Walk   Phase Initial Discharge    Distance 1387 feet 2050 feet    Distance % Change  - 47.8 %  663 ft    Walk Time 6 minutes 6 minutes    # of Rest Breaks 0 0    MPH 2.62 3.88    METS 3.4 4.51    RPE 8 13    VO2 Peak 11.91 15.8    Symptoms No No    Resting HR 58 bpm 73 bpm    Resting BP 126/60 128/64    Max Ex. HR 118 bpm 120 bpm    Max Ex. BP 132/70 126/74    2 Minute Post BP 124/70  -       Psychological, QOL, Others - Outcomes: PHQ 2/9: Depression screen [facility] 2/9 01/05/2017 11/14/2016 11/10/2016 05/13/2016 02/23/2015  Decreased Interest 1 0 0 0 0  Down, Depressed, Hopeless 1 0 0 0 0  PHQ - 2 Score 2 0 0 0 0  Altered sleeping 1 0 0 - -  Tired, decreased energy 1 3 1  - -  Change in appetite 0 0 0 - -  Feeling bad or failure about yourself  0 0 0 - -  Trouble concentrating 0 0 0 - -  Moving slowly or fidgety/restless 0 0 0 - -  Suicidal thoughts 0 0 0 - -  PHQ-9 Score 4 3 1  - -  Difficult doing work/chores Not difficult at all - - - -    Quality of Life:      Quality of Life - 01/05/17 0758      Quality of Life Scores   Health/Function Pre 25.6 %   Health/Function Post 25.11 %   Health/Function % Change -1.91 %   Socioeconomic Pre 26.29 %   Socioeconomic Post 27 %   Socioeconomic % Change  2.7 %   Psych/Spiritual Pre 26.36 %   Psych/Spiritual Post 27.36 %   Psych/Spiritual % Change 3.79 %   Family Pre 28.5 %   Family Post 25.13 %   Family % Change -11.82 %   GLOBAL Pre 26.26 %   GLOBAL Post 26.02 %   GLOBAL % Change -0.91 %      Personal Goals: Goals established at orientation with interventions provided to work toward goal.     Personal Goals and Risk Factors at Admission - 11/10/16 1428      Core Components/Risk Factors/Patient Goals on Admission    Weight Management Yes;Weight Loss   Intervention Weight Management: Develop a combined nutrition and exercise program designed to reach desired caloric intake, while maintaining appropriate intake of nutrient and fiber, sodium and fats, and appropriate energy expenditure required for the weight goal.;Weight Management: Provide education and appropriate resources to help participant work on and attain dietary goals.   Admit Weight 205 lb (93 kg)   Goal Weight: Short Term 203 lb (92.1 kg)   Goal Weight: Long Term 200 lb (90.7 kg)   Expected Outcomes Long Term: Adherence to nutrition and physical activity/exercise program aimed toward attainment of established weight goal;Short Term: Continue to assess and modify interventions until short term weight is achieved;Weight Maintenance: Understanding of the daily nutrition guidelines, which includes 25-35% calories from fat, 7% or less cal from saturated fats, less than 200mg  cholesterol, less than 1.5gm of sodium, & 5 or more servings of fruits and vegetables daily   Diabetes Yes  LAst A1C over 9%, has changed nutriton habits significantly since. FBA are 80s now were  over 110   Intervention Provide education about signs/symptoms and action to  take for hypo/hyperglycemia.;Provide education about proper nutrition, including hydration, and aerobic/resistive exercise prescription along with prescribed medications to achieve blood glucose in normal ranges: Fasting glucose 65-99 mg/dL   Expected Outcomes Short Term: Participant verbalizes understanding of the signs/symptoms and immediate care of hyper/hypoglycemia, proper foot care and importance of medication, aerobic/resistive exercise and nutrition plan for blood glucose control.;Long Term: Attainment of HbA1C < 7%.   Hypertension Yes   Intervention Provide education on lifestyle modifcations including regular physical activity/exercise, weight management, moderate sodium restriction and increased consumption of fresh fruit, vegetables, and low fat dairy, alcohol moderation, and smoking cessation.;Monitor prescription use compliance.   Expected Outcomes Short Term: Continued assessment and intervention until BP is < 140/76mm HG in hypertensive participants. < 130/3mm HG in hypertensive participants with diabetes, heart failure or chronic kidney disease.;Long Term: Maintenance of blood pressure at goal levels.   Lipids Yes   Intervention Provide education and support for participant on nutrition & aerobic/resistive exercise along with prescribed medications to achieve LDL 70mg , HDL >40mg .   Expected Outcomes Short Term: Participant states understanding of desired cholesterol values and is compliant with medications prescribed. Participant is following exercise prescription and nutrition guidelines.;Long Term: Cholesterol controlled with medications as prescribed, with individualized exercise RX and with personalized nutrition plan. Value goals: LDL < 70mg , HDL > 40 mg.       Personal Goals Discharge:     Goals and Risk Factor Review    Row Name 12/17/16 0953             Core Components/Risk Factors/Patient Goals Review   Personal Goals Review Weight  Management/Obesity;Hypertension;Lipids;Diabetes       Review Barbarann Ehlers has been doing well in rehab.  He is still feeling like his energy levels have not completely returned yet.  His weight has been steady.  His blood pressures and blood sugars have all done very well in rehab.  His A1C is down to 6.3!  He is tolerating his statin well this time around .       Expected Outcomes Short: Continue to keep eye on blood sugars.  Long: Continue to work on risk factor modification          Nutrition & Weight - Outcomes:     Pre Biometrics - 11/10/16 1445      Pre Biometrics   Height 6' 4.2" (1.935 m)   Weight 205 lb (93 kg)   Waist Circumference 27 inches   Hip Circumference 39.5 inches   Waist to Hip Ratio 0.68 %   BMI (Calculated) 24.9   Single Leg Stand 8.73 seconds       Nutrition:     Nutrition Therapy & Goals - 12/01/16 1030      Nutrition Therapy   Diet Instructed on a meal plan based on heart healthy and diabetes dietary guidelines.   Drug/Food Interactions Statins/Certain Fruits   Protein (specify units) 8 oz   Fiber 30 grams   Whole Grain Foods 3 servings   Saturated Fats 14 max. grams   Fruits and Vegetables 5 servings/day   Sodium 2000 grams     Personal Nutrition Goals   Nutrition Goal Increase intake of whole grain servings-Refer to list.   Personal Goal #2 Increase fruits and vegetables with goal of minimum of 5 servings daily.   Personal Goal #3 Read labels for saturated fat, trans fat and sodium.     Intervention Plan  Intervention Prescribe, educate and counsel regarding individualized specific dietary modifications aiming towards targeted core components such as weight, hypertension, lipid management, diabetes, heart failure and other comorbidities.;Nutrition handout(s) given to patient.   Expected Outcomes Short Term Goal: A plan has been developed with personal nutrition goals set during dietitian appointment.;Long Term Goal: Adherence to prescribed nutrition  plan.      Nutrition Discharge:     Nutrition Assessments - 01/05/17 0757      MEDFICTS Scores   Pre Score 10   Post Score 28   Score Difference 18      Education Questionnaire Score:     Knowledge Questionnaire Score - 01/05/17 0757      Knowledge Questionnaire Score   Pre Score 22/28   Post Score 24/28      Goals reviewed with patient; copy given to patient.

## 2017-01-07 NOTE — Progress Notes (Signed)
Daily Session Note  Patient Details  Name: Stuart Henry MRN: [id number] Date of Birth: 1941/04/20 Referring Provider:     Cardiac Rehab from 11/10/2016 in [facility]  Referring Provider  Henry, Stuart Gave MD      Encounter Date: 01/07/2017  Check In:     Session Check In - 01/07/17 0846      Check-In   Location [facility]   Staff Present Stuart Lark, RN, BSN, CCRP;Stuart Luan Pulling, MA, ACSM RCEP, Exercise Physiologist;Stuart Henry, BA, ACSM CEP, Exercise Physiologist   Supervising physician immediately available to respond to emergencies See telemetry face sheet for immediately available ER MD   Medication changes reported     No   Fall or balance concerns reported    No   Warm-up and Cool-down Performed on first and last piece of equipment   Resistance Training Performed Yes   VAD Patient? No     Pain Assessment   Currently in Pain? No/denies         History  Smoking Status  . Former Smoker  . Packs/day: 1.00  . Years: 5.00  . Types: Cigarettes  . Quit date: 08/06/1972  Smokeless Tobacco  . Never Used    Goals Met:  Independence with exercise equipment Exercise tolerated well No report of cardiac concerns or symptoms Strength training completed today  Goals Unmet:  Not Applicable  Comments:  Stuart Henry graduated today from cardiac rehab with 36 sessions completed.  Details of the patient's exercise prescription and what she needs to do in order to continue the prescription and progress were discussed with patient.  Patient was given a copy of prescription and goals.  Patient verbalized understanding.  Stuart Henry plans to continue to exercise by biking at home..    Dr. Emily Henry is Medical Director for [name].

## 2017-01-07 NOTE — Progress Notes (Signed)
Cardiac Individual Treatment Plan  Patient Details  Name: Stuart Henry MRN: [id number] Date of Birth: 04/11/1941 Referring Provider:     Cardiac Rehab from 11/10/2016 in [facility] Cardiac and Pulmonary Rehab  Referring Provider  End, Harrell Gave MD      Initial Encounter Date:    Cardiac Rehab from 11/10/2016 in [facility] Cardiac and Pulmonary Rehab  Date  11/10/16  Referring Provider  End, Harrell Gave MD      Visit Diagnosis: S/P CABG x 4  Patient's Home Medications on Admission:  Current Outpatient Prescriptions:  .  ACCU-CHEK AVIVA PLUS test strip, Check blood sugar once  daily, Disp: 100 each, Rfl: 4 .  allopurinol (ZYLOPRIM) 100 MG tablet, TAKE 1 TABLET DAILY, Disp: 90 tablet, Rfl: 4 .  aspirin EC 81 MG tablet, Take 1 tablet (81 mg total) by mouth daily., Disp: 90 tablet, Rfl: 3 .  atorvastatin (LIPITOR) 40 MG tablet, Take 1 tablet (40 mg total) by mouth daily. (Patient taking differently: Take 40 mg by mouth at bedtime. ), Disp: 30 tablet, Rfl: 5 .  Blood Glucose Monitoring Suppl (ACCU-CHEK AVIVA PLUS) w/Device KIT, Use to check blood sugar daily as directed, Disp: 1 kit, Rfl: 0 .  carvedilol (COREG) 3.125 MG tablet, Take 1 tablet (3.125 mg total) by mouth 2 (two) times daily., Disp: 180 tablet, Rfl: 3 .  empagliflozin (JARDIANCE) 25 MG TABS tablet, Take 12.5 mg by mouth daily., Disp: 1 tablet, Rfl: 1 .  fluticasone (FLONASE) 50 MCG/ACT nasal spray, Place 2 sprays into both nostrils daily. (Patient taking differently: Place 2 sprays into both nostrils daily as needed for allergies. ), Disp: 16 g, Rfl: 6 .  gabapentin (NEURONTIN) 300 MG capsule, TAKE ONE CAPSULE BY MOUTH AT BEDTIME FOR NEUROPATHY, Disp: 90 capsule, Rfl: 4 .  glipiZIDE (GLUCOTROL XL) 10 MG 24 hr tablet, 1/2 tablet daily, Disp: 1 tablet, Rfl: 3 .  metFORMIN (GLUCOPHAGE) 1000 MG tablet, Take 1,000 mg by mouth 2 (two) times daily with a meal., Disp: , Rfl:  .  nitroGLYCERIN (NITROSTAT) 0.4 MG SL tablet, as needed., Disp: ,  Rfl:  .  pioglitazone (ACTOS) 45 MG tablet, Take 1 tablet (45 mg total) by mouth daily., Disp: 1 tablet, Rfl: 1  Past Medical History: Past Medical History:  Diagnosis Date  . History of gout    "controlled w/daily RX:" (09/26/2016)  . History of hiatal hernia    S/P OR    Tobacco Use: History  Smoking Status  . Former Smoker  . Packs/day: 1.00  . Years: 5.00  . Types: Cigarettes  . Quit date: 08/06/1972  Smokeless Tobacco  . Never Used    Labs: Recent Review Flowsheet Data    Labs for ITP Cardiac and Pulmonary Rehab Latest Ref Rng & Units 09/24/2016 09/24/2016 09/25/2016 11/14/2016 01/01/2017   Cholestrol 100 - 199 mg/dL - - - - 99(L)   LDLCALC 0 - 99 mg/dL - - - - 40   HDL >39 mg/dL - - - - 38(L)   Trlycerides 0 - 149 mg/dL - - - - 103   Hemoglobin A1c - - - - 6.3 -   PHART 7.350 - 7.450 7.345(L) - - - -   PCO2ART 32.0 - 48.0 mmHg 40.8 - - - -   HCO3 20.0 - 28.0 mmol/L 22.0 - - - -   TCO2 0 - 100 mmol/L _0 - -   ACIDBASEDEF 0.0 - 2.0 mmol/L 3.0(H) - - - -   O2SAT % 96.0 - - - -  Exercise Target Goals:    Exercise Program Goal: Individual exercise prescription set with THRR, safety & activity barriers. Participant demonstrates ability to understand and report RPE using BORG scale, to self-measure pulse accurately, and to acknowledge the importance of the exercise prescription.  Exercise Prescription Goal: Starting with aerobic activity 30 plus minutes a day, 3 days per week for initial exercise prescription. Provide home exercise prescription and guidelines that participant acknowledges understanding prior to discharge.  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 HR 118 bpm 120 bpm    Max Ex. BP 132/70 126/74    2 Minute Post BP 124/70  -       Oxygen Initial Assessment:   Oxygen Re-Evaluation:   Oxygen Discharge (Final Oxygen Re-Evaluation):   Initial Exercise Prescription:     Initial Exercise Prescription - 11/10/16 1400      Date of Initial Exercise RX and Referring Provider   Date 11/10/16   Referring Provider End, Harrell Gave MD     Treadmill   MPH 2.6   Grade 1   Minutes 15   METs 3.35     Elliptical   Level 1   Speed 3.1   Minutes 15     T5 Nustep   Level 2   SPM 80   Minutes 15   METs 2.5     Prescription Details   Frequency (times per week) 3   Duration Progress to 45 minutes of aerobic exercise without signs/symptoms of physical distress     Intensity   THRR 40-80% of Max Heartrate 94-129   Ratings of Perceived Exertion 11-13   Perceived Dyspnea 0-4     Progression   Progression Continue to progress workloads to maintain intensity without signs/symptoms of physical distress.     Resistance Training   Training Prescription Yes   Weight 3 lbs   Reps 10-15      Perform Capillary Blood Glucose checks as needed.  Exercise Prescription Changes:      Exercise Prescription Changes    Row Name 11/10/16 1400 11/20/16 1100 11/24/16 0900 12/03/16 1600 12/15/16 1500     Response to Exercise   Blood Pressure (Admit) 126/60 108/56  - 124/72 110/72   Blood Pressure (Exercise) 132/70 152/76  - 150/74 140/80   Blood Pressure (Exit) 124/70 106/56  - 120/72 130/64   Heart Rate (Admit) 58 bpm 55 bpm  - 85 bpm 80 bpm   Heart Rate  (Exercise) 118 bpm 91 bpm  - 130 bpm  125 bpm   Heart Rate (Exit) 53 bpm 70 bpm  - 92 bpm 90 bpm   Oxygen Saturation (Admit) 99 %  -  -  -  -   Oxygen Saturation (Exercise) 99 %  -  -  -  -   Rating of Perceived Exertion (Exercise) 8 12  - 12 13   Symptoms _0    Comments walk test results  -  -  -  -   Duration  - Continue with 45 min of aerobic exercise without signs/symptoms of physical distress. Continue with 45 min of aerobic exercise without signs/symptoms of physical distress. Continue with 45 min of aerobic exercise without signs/symptoms of physical distress. Continue with 45 min of aerobic exercise without signs/symptoms of physical distress.   Intensity  - THRR unchanged THRR unchanged THRR unchanged THRR unchanged     Progression   Progression  - Continue to progress workloads to maintain intensity without signs/symptoms of physical distress. Continue to progress workloads to maintain intensity without signs/symptoms of physical distress. Continue to progress workloads to maintain intensity without signs/symptoms of physical distress. Continue to progress workloads to maintain intensity without signs/symptoms of physical distress.   Average METs  - 4.23 4.23 3.87 5.85     Resistance Training   Training Prescription  - Yes Yes Yes Yes   Weight  - 3 lbs 3 lbs 3 lbs 3 lbs   Reps  - 10-15 10-15 10-15 10-15     Interval Training   Interval Training  - No No Yes Yes   Equipment  -  -  - NuStep;Bike NuStep;Bike;Elliptical   Comments  -  -  - 2 min on 30 sec off 2 min on 30 sec off     Bike   Level  - 1.5 1.5 1.8 2.1   Watts  -  -  - 90 102   Minutes  - _1 METs  - 3.86 3.86 5.15 6.4     Elliptical   Level  - _2 Speed  - 3.1 3.1 3.7 4.5   Minutes  - _3 T5 Nustep   Level  - _4 SPM  - 100 100  -  -   Minutes  - _5 METs  - 4.6 4.6 2.9 5.3     Home Exercise Plan   Plans to continue exercise at  -  - [employer] (comment)  [employer] (comment)  [employer] (comment)  [employer]   Frequency  -  - Add 2 additional days to program exercise sessions. Add 2 additional days to program exercise sessions. Add 2 additional days to program exercise sessions.   Initial Home Exercises Provided  -  - 11/24/16 11/24/16 11/24/16   Row Name 12/31/16 1400             Response to Exercise   Blood Pressure (Admit) 122/56       Blood Pressure (Exercise) 164/78       Blood Pressure (Exit) 126/70       Heart Rate (Admit) 65 bpm       Heart Rate (Exercise) 124 bpm       Heart Rate (Exit) 70 bpm       Rating of Perceived Exertion (Exercise) 13  Symptoms none       Duration Continue with 45 min of aerobic exercise without signs/symptoms of physical distress.       Intensity THRR unchanged         Progression   Progression Continue to progress workloads to maintain intensity without signs/symptoms of physical distress.       Average METs 4.42         Resistance Training   Training Prescription Yes       Weight 7 lbs       Reps 10-15         Interval Training   Interval Training Yes       Equipment NuStep;Bike;Elliptical       Comments 2 min on 30 sec off         Bike   Watts 108  avg (interval 80W-160W)       Minutes 15       METs 5.38         Elliptical   Level 4       Speed 4.5       Minutes 15         T5 Nustep   Level 5       Minutes 15       METs 3.5         Home Exercise Plan   Plans to continue exercise at [employer] (comment)  [employer]       Frequency Add 2 additional days to program exercise sessions.       Initial Home Exercises Provided 11/24/16          Exercise Comments:      Exercise Comments    Row Name 11/12/16 [id number] 11/24/16 0841 11/24/16 0901 01/07/17 0920     Exercise Comments  First full day of exercise!  Patient was oriented to gym and equipment including functions, settings, policies, and procedures.   Patient's individual exercise prescription and treatment plan were reviewed.  All starting workloads were established based on the results of the 6 minute walk test done at initial orientation visit.  The plan for exercise progression was also introduced and progression will be customized based on patient's performance and goals. Reviewed METs average and discussed progression with pt today. Home exercise guidelines were reviewed with patient today. He plans on going to [employer] 2x a week on non-cardiac rehab days to exercise for a total of 5 days per week.  Atlas graduated today from cardiac rehab with 36 sessions completed.  Details of the patient's exercise prescription and what she needs to do in order to continue the prescription and progress were discussed with patient.  Patient was given a copy of prescription and goals.  Patient verbalized understanding.  Sartaj plans to continue to exercise by biking at home..       Exercise Goals and Review:      Exercise Goals    Row Name 11/10/16 1445             Exercise Goals   Increase Physical Activity Yes       Intervention Provide advice, education, support and counseling about physical activity/exercise needs.;Develop an individualized exercise prescription for aerobic and resistive training based on initial evaluation findings, risk stratification, comorbidities and participant's personal goals.       Expected Outcomes Achievement of increased cardiorespiratory fitness and enhanced flexibility, muscular endurance and strength shown through measurements of functional capacity and personal statement of participant.  Increase Strength and Stamina Yes       Intervention Provide advice, education, support and counseling about physical activity/exercise needs.;Develop an individualized exercise prescription for aerobic and resistive training based on initial evaluation findings, risk stratification, comorbidities and participant's personal  goals.       Expected Outcomes Achievement of increased cardiorespiratory fitness and enhanced flexibility, muscular endurance and strength shown through measurements of functional capacity and personal statement of participant.          Exercise Goals Re-Evaluation :     Exercise Goals Re-Evaluation    Row Name 11/20/16 1124 12/03/16 1608 12/15/16 1447 12/17/16 0950 12/31/16 1453     Exercise Goal Re-Evaluation   Exercise Goals Review Increase Physical Activity;Increase Strenth and Stamina Increase Physical Activity;Increase Strenth and Stamina Increase Physical Activity;Increase Strenth and Stamina Increase Physical Activity;Increase Strenth and Stamina Increase Physical Activity;Increase Strenth and Stamina   Comments Barbarann Ehlers is off to a good start in rehab.  At first, he did not like coming to exercise since he has been an avid exerciser all his life, but he starting to see the benefit of coming for education and learning his new limits.  He has been doing well on the equipment and "likes" the challenge of the elliptical as it is his hardest piece.  We will continue to monitor his progression. Barbarann Ehlers has been doing well in rehab.  He continues to make progress on all of his equipment and is now up to level 2 on the elliptical and 90 watts on the bike.  We will continue to monitor his progression. Barbarann Ehlers continues to do well in rehab.  He has increased to level 4 on the elliptical!  He is enjoying the intervals.  We will continue to monitor his progression. Barbarann Ehlers has not been doing his home exercise as he just not feeling like it.  He is still lacking energy overall, but able to get the things done that he wants to do. Barbarann Ehlers is still not going to the gym on his off days.  He is planning to return to the gym when he finishes rehab.  He works hard when he is here and is averaging [employer] on the bike (intervals as high as 160 Watts).  We will continue to monitor his progress.   Expected Outcomes Short: Begin  to increase some of his workloads.  Long: Continue to work on improving his stamina. Short: Continue to work on improving his workloads.  Long: Able to do more back at gym again. Short: Continue to work on pushing with intervals.  Long: Able to get back to gym confidently. Short: Continue to exercise in class to work on stamina.  Long: Return to gym. Short and Long: Continue to work on building up his stamina.   [place] Name 01/02/17 1002             Exercise Goal Re-Evaluation   Exercise Goals Review Increase Physical Activity;Increase Strenth and Stamina       Comments Dick improved his walk test by 663 ft!!          Discharge Exercise Prescription (Final Exercise Prescription Changes):     Exercise Prescription Changes - 12/31/16 1400      Response to Exercise   Blood Pressure (Admit) 122/56   Blood Pressure (Exercise) 164/78   Blood Pressure (Exit) 126/70   Heart Rate (Admit) 65 bpm   Heart Rate (Exercise) 124 bpm   Heart Rate (Exit) 70 bpm   Rating of Perceived  Exertion (Exercise) 13   Symptoms none   Duration Continue with 45 min of aerobic exercise without signs/symptoms of physical distress.   Intensity THRR unchanged     Progression   Progression Continue to progress workloads to maintain intensity without signs/symptoms of physical distress.   Average METs 4.42     Resistance Training   Training Prescription Yes   Weight 7 lbs   Reps 10-15     Interval Training   Interval Training Yes   Equipment NuStep;Bike;Elliptical   Comments 2 min on 30 sec off     Bike   Watts 108  avg (interval 80W-160W)   Minutes 15   METs 5.38     Elliptical   Level 4   Speed 4.5   Minutes 15     T5 Nustep   Level 5   Minutes 15   METs 3.5     Home Exercise Plan   Plans to continue exercise at [employer] (comment)  [employer]   Frequency Add 2 additional days to program exercise sessions.   Initial Home Exercises Provided 11/24/16      Nutrition:  Target  Goals: Understanding of nutrition guidelines, daily intake of sodium <1524m, cholesterol <204m calories 30% from fat and 7% or less from saturated fats, daily to have 5 or more servings of fruits and vegetables.  Biometrics:     Pre Biometrics - 11/10/16 1445      Pre Biometrics   Height 6' 4.2" (1.935 m)   Weight 205 lb (93 kg)   Waist Circumference 27 inches   Hip Circumference 39.5 inches   Waist to Hip Ratio 0.68 %   BMI (Calculated) 24.9   Single Leg Stand 8.73 seconds       Nutrition Therapy Plan and Nutrition Goals:     Nutrition Therapy & Goals - 12/01/16 1030      Nutrition Therapy   Diet Instructed on a meal plan based on heart healthy and diabetes dietary guidelines.   Drug/Food Interactions Statins/Certain Fruits   Protein (specify units) 8 oz   Fiber 30 grams   Whole Grain Foods 3 servings   Saturated Fats 14 max. grams   Fruits and Vegetables 5 servings/day   Sodium 2000 grams     Personal Nutrition Goals   Nutrition Goal Increase intake of whole grain servings-Refer to list.   Personal Goal #2 Increase fruits and vegetables with goal of minimum of 5 servings daily.   Personal Goal #3 Read labels for saturated fat, trans fat and sodium.     Intervention Plan   Intervention Prescribe, educate and counsel regarding individualized specific dietary modifications aiming towards targeted core components such as weight, hypertension, lipid management, diabetes, heart failure and other comorbidities.;Nutrition handout(s) given to patient.   Expected Outcomes Short Term Goal: A plan has been developed with personal nutrition goals set during dietitian appointment.;Long Term Goal: Adherence to prescribed nutrition plan.      Nutrition Discharge: Rate Your Plate Scores:     Nutrition Assessments - 01/05/17 0757      MEDFICTS Scores   Pre Score 10   Post Score 28   Score Difference 18      Nutrition Goals Re-Evaluation:     Nutrition Goals  Re-Evaluation    Row Name 12/17/16 0957             Goals   Nutrition Goal Increase whole grains, more fruits and vegetables, read labels  Comment Barbarann Ehlers is eating a lot more vegetables and reading labels.  He has not increased his whole grains as he has avoided breads/grains more often. He is focused on low carb diet currently.       Expected Outcome Short: Continue to eat vegetables with each meal.  Long: Maintain heart healthy diet.          Nutrition Goals Discharge (Final Nutrition Goals Re-Evaluation):     Nutrition Goals Re-Evaluation - 12/17/16 0957      Goals   Nutrition Goal Increase whole grains, more fruits and vegetables, read labels   Comment Barbarann Ehlers is eating a lot more vegetables and reading labels.  He has not increased his whole grains as he has avoided breads/grains more often. He is focused on low carb diet currently.   Expected Outcome Short: Continue to eat vegetables with each meal.  Long: Maintain heart healthy diet.      Psychosocial: Target Goals: Acknowledge presence or absence of significant depression and/or stress, maximize coping skills, provide positive support system. Participant is able to verbalize types and ability to use techniques and skills needed for reducing stress and depression.   Initial Review & Psychosocial Screening:     Initial Psych Review & Screening - 11/10/16 1425      Initial Review   Current issues with None Identified     Family Dynamics   Good Support System? Yes  Wife and friends     Barriers   Psychosocial barriers to participate in program There are no identifiable barriers or psychosocial needs.;The patient should benefit from training in stress management and relaxation.     Screening Interventions   Interventions Encouraged to exercise      Quality of Life Scores:      Quality of Life - 01/05/17 0758      Quality of Life Scores   Health/Function Pre 25.6 %   Health/Function Post 25.11 %    Health/Function % Change -1.91 %   Socioeconomic Pre 26.29 %   Socioeconomic Post 27 %   Socioeconomic % Change  2.7 %   Psych/Spiritual Pre 26.36 %   Psych/Spiritual Post 27.36 %   Psych/Spiritual % Change 3.79 %   Family Pre 28.5 %   Family Post 25.13 %   Family % Change -11.82 %   GLOBAL Pre 26.26 %   GLOBAL Post 26.02 %   GLOBAL % Change -0.91 %      PHQ-9: Recent Review Flowsheet Data    Depression screen [facility] 2/9 01/05/2017 11/14/2016 11/10/2016 05/13/2016 02/23/2015   Decreased Interest 1 0 0 0 0   Down, Depressed, Hopeless 1 0 0 0 0   PHQ - 2 Score 2 0 0 0 0   Altered sleeping 1 0 0 - -   Tired, decreased energy _0 - -   Change in appetite 0 0 0 - -   Feeling bad or failure about yourself  0 0 0 - -   Trouble concentrating 0 0 0 - -   Moving slowly or fidgety/restless 0 0 0 - -   Suicidal thoughts 0 0 0 - -   PHQ-9 Score _1 - -   Difficult doing work/chores Not difficult at all - - - -     Interpretation of Total Score  Total Score Depression Severity:  1-4 = Minimal depression, 5-9 = Mild depression, 10-14 = Moderate depression, 15-19 = Moderately severe depression, 20-27 = Severe depression   Psychosocial  Evaluation and Intervention:     Psychosocial Evaluation - 11/12/16 0939      Psychosocial Evaluation & Interventions   Interventions Encouraged to exercise with the program and follow exercise prescription;Stress management education   Comments Counselor met with Mr. Kleinert Barbarann Ehlers) today for initial psychosocial evaluation.  He is a 76 year old who had a CABGx4 on 2/21.  Barbarann Ehlers has a strong support system with a spouse of 1 years.  He also has (2) siblings who live close by and many close friends.  He also struggles with diabetes.  Barbarann Ehlers states he always slept well prior to the surgery in February.  Now he is up and down going to the bathroom all night, but still reports he sleeps at least 6 hours each night.  He states his appetite is improving recently as well.   Barbarann Ehlers denies a history of depression or anxiety or any current symptoms and is typically in a positive mood.  He states his health is his current primary stressor; since he is "confused" on why he has always been extremely active with golf and biking, etc., and no family history of heart disease; and yet he had quadruple blockages.  His goals for this program are to learn to eat healthier and to "make this program worthwhile."  Following this program, he will continue to walk and exercise consistently since this has been part of his normal lifestyle.  Staff will follow with Barbarann Ehlers throughout the course of this program.   Expected Outcomes Barbarann Ehlers will exercise consistently and hopefully begin to sleep better and regain his strength and stamina.  He will benefit from attendance and participation in the psychoeducational components of this program to help with coping with his current health concerns.  Barbarann Ehlers will meet with the dietician to help with his healthier eating goals.     Continue Psychosocial Services  Follow up required by staff      Psychosocial Re-Evaluation:     Psychosocial Re-Evaluation    [place] Name 12/17/16 [phone]             Psychosocial Re-Evaluation   Current issues with Current Stress Concerns       Comments Barbarann Ehlers has been doing well in rehab.  He is not "stressed" and generally has a positive attitude.  He is sleeping better now.  His biggest concern is that he has lost faith in his belief that exercise is good for him and healthy.  Since, his heart event, he has been questioning the effectiveness of exercise. He hopes to return to that belief that is is helpful.  But he will continue to exercise daily as that is what he has always done and he enjoys it.       Expected Outcomes Short: Continue to attend classes for camraderie and exercise.  Long: Work on building his faith back up        Interventions Stress management education;Encouraged to attend Cardiac Rehabilitation for the exercise        Continue Psychosocial Services  Follow up required by staff          Psychosocial Discharge (Final Psychosocial Re-Evaluation):     Psychosocial Re-Evaluation - 12/17/16 0959      Psychosocial Re-Evaluation   Current issues with Current Stress Concerns   Comments Barbarann Ehlers has been doing well in rehab.  He is not "stressed" and generally has a positive attitude.  He is sleeping better now.  His biggest concern is that he has lost faith in  his belief that exercise is good for him and healthy.  Since, his heart event, he has been questioning the effectiveness of exercise. He hopes to return to that belief that is is helpful.  But he will continue to exercise daily as that is what he has always done and he enjoys it.   Expected Outcomes Short: Continue to attend classes for camraderie and exercise.  Long: Work on building his faith back up    Interventions Stress management education;Encouraged to attend Cardiac Rehabilitation for the exercise   Continue Psychosocial Services  Follow up required by staff      Vocational Rehabilitation: Provide vocational rehab assistance to qualifying candidates.   Vocational Rehab Evaluation & Intervention:     Vocational Rehab - 11/10/16 1428      Initial Vocational Rehab Evaluation & Intervention   Assessment shows need for Vocational Rehabilitation No      Education: Education Goals: Education classes will be provided on a weekly basis, covering required topics. Participant will state understanding/return demonstration of topics presented.  Learning Barriers/Preferences:     Learning Barriers/Preferences - 11/10/16 1428      Learning Barriers/Preferences   Learning Barriers None   Learning Preferences None      Education Topics: General Nutrition Guidelines/Fats and Fiber: -Group instruction provided by verbal, written material, models and posters to present the general guidelines for heart healthy nutrition. Gives an explanation and  review of dietary fats and fiber.   Cardiac Rehab from 01/05/2017 in [facility] Cardiac and Pulmonary Rehab  Date  12/01/16  Educator  CR  Instruction Review Code  2- meets goals/outcomes      Controlling Sodium/Reading Food Labels: -Group verbal and written material supporting the discussion of sodium use in heart healthy nutrition. Review and explanation with models, verbal and written materials for utilization of the food label.   Cardiac Rehab from 01/05/2017 in [facility] Cardiac and Pulmonary Rehab  Date  12/09/16  Educator  PI  Instruction Review Code  2- meets goals/outcomes      Exercise Physiology & Risk Factors: - Group verbal and written instruction with models to review the exercise physiology of the cardiovascular system and associated critical values. Details cardiovascular disease risk factors and the goals associated with each risk factor.   Cardiac Rehab from 01/05/2017 in [facility] Cardiac and Pulmonary Rehab  Date  12/15/16  Educator  [facility]  Instruction Review Code  2- meets goals/outcomes      Aerobic Exercise & Resistance Training: - Gives group verbal and written discussion on the health impact of inactivity. On the components of aerobic and resistive training programs and the benefits of this training and how to safely progress through these programs.   Cardiac Rehab from 01/05/2017 in [facility] Cardiac and Pulmonary Rehab  Date  12/17/16  Educator  [facility]  Instruction Review Code  2- meets goals/outcomes      Flexibility, Balance, General Exercise Guidelines: - Provides group verbal and written instruction on the benefits of flexibility and balance training programs. Provides general exercise guidelines with specific guidelines to those with heart or lung disease. Demonstration and skill practice provided.   Cardiac Rehab from 01/05/2017 in [facility] Cardiac and Pulmonary Rehab  Date  12/22/16  Educator  [facility]  Instruction Review Code  2- meets goals/outcomes      Stress Management: - Provides  group verbal and written instruction about the health risks of elevated stress, cause of high stress, and healthy ways to reduce stress.   Cardiac Rehab  from 01/05/2017 in [facility] Cardiac and Pulmonary Rehab  Date  12/31/16  Educator  [facility]  Instruction Review Code  2- meets goals/outcomes      Depression: - Provides group verbal and written instruction on the correlation between heart/lung disease and depressed mood, treatment options, and the stigmas associated with seeking treatment.   Cardiac Rehab from 01/05/2017 in [facility] Cardiac and Pulmonary Rehab  Date  12/03/16  Educator  [facility]  Instruction Review Code  2- meets goals/outcomes      Anatomy & Physiology of the Heart: - Group verbal and written instruction and models provide basic cardiac anatomy and physiology, with the coronary electrical and arterial systems. Review of: AMI, Angina, Valve disease, Heart Failure, Cardiac Arrhythmia, Pacemakers, and the ICD.   Cardiac Rehab from 01/05/2017 in [facility] Cardiac and Pulmonary Rehab  Date  12/10/16  Educator  MA  Instruction Review Code  2- meets goals/outcomes      Cardiac Procedures: - Group verbal and written instruction and models to describe the testing methods done to diagnose heart disease. Reviews the outcomes of the test results. Describes the treatment choices: Medical Management, Angioplasty, or Coronary Bypass Surgery.   Cardiac Rehab from 01/05/2017 in [facility] Cardiac and Pulmonary Rehab  Date  01/05/17  Educator  CE  Instruction Review Code  2- meets goals/outcomes      Cardiac Medications: - Group verbal and written instruction to review commonly prescribed medications for heart disease. Reviews the medication, class of the drug, and side effects. Includes the steps to properly store meds and maintain the prescription regimen.   Cardiac Rehab from 01/05/2017 in [facility] Cardiac and Pulmonary Rehab  Date  11/19/16 Marisue Humble 2]  Educator  SB  Instruction Review Code  1- partially meets, needs  review/practice [part one]      Go Sex-Intimacy & Heart Disease, Get SMART - Goal Setting: - Group verbal and written instruction through game format to discuss heart disease and the return to sexual intimacy. Provides group verbal and written material to discuss and apply goal setting through the application of the S.M.A.R.T. Method.   Cardiac Rehab from 01/05/2017 in [facility] Cardiac and Pulmonary Rehab  Date  01/05/17  Educator  CE  Instruction Review Code  2- meets goals/outcomes      Other Matters of the Heart: - Provides group verbal, written materials and models to describe Heart Failure, Angina, Valve Disease, and Diabetes in the realm of heart disease. Includes description of the disease process and treatment options available to the cardiac patient.   Exercise & Equipment Safety: - Individual verbal instruction and demonstration of equipment use and safety with use of the equipment.   Cardiac Rehab from 01/05/2017 in [facility] Cardiac and Pulmonary Rehab  Date  11/10/16  Educator  Sb  Instruction Review Code  2- meets goals/outcomes      Infection Prevention: - Provides verbal and written material to individual with discussion of infection control including proper hand washing and proper equipment cleaning during exercise session.   Cardiac Rehab from 01/05/2017 in [facility] Cardiac and Pulmonary Rehab  Date  11/10/16  Educator  SB  Instruction Review Code  2- meets goals/outcomes      Falls Prevention: - Provides verbal and written material to individual with discussion of falls prevention and safety.   Cardiac Rehab from 01/05/2017 in [facility] Cardiac and Pulmonary Rehab  Date  11/10/16  Educator  SB  Instruction Review Code  2- meets goals/outcomes      Diabetes: -  Individual verbal and written instruction to review signs/symptoms of diabetes, desired ranges of glucose level fasting, after meals and with exercise. Advice that pre and post exercise glucose checks will be done for 3  sessions at entry of program.   Cardiac Rehab from 01/05/2017 in [facility] Cardiac and Pulmonary Rehab  Date  11/10/16  Educator  SB  Instruction Review Code  2- meets goals/outcomes       Knowledge Questionnaire Score:     Knowledge Questionnaire Score - 01/05/17 0757      Knowledge Questionnaire Score   Pre Score 22/28   Post Score 24/28      Core Components/Risk Factors/Patient Goals at Admission:     Personal Goals and Risk Factors at Admission - 11/10/16 1428      Core Components/Risk Factors/Patient Goals on Admission    Weight Management Yes;Weight Loss   Intervention Weight Management: Develop a combined nutrition and exercise program designed to reach desired caloric intake, while maintaining appropriate intake of nutrient and fiber, sodium and fats, and appropriate energy expenditure required for the weight goal.;Weight Management: Provide education and appropriate resources to help participant work on and attain dietary goals.   Admit Weight 205 lb (93 kg)   Goal Weight: Short Term 203 lb (92.1 kg)   Goal Weight: Long Term 200 lb (90.7 kg)   Expected Outcomes Long Term: Adherence to nutrition and physical activity/exercise program aimed toward attainment of established weight goal;Short Term: Continue to assess and modify interventions until short term weight is achieved;Weight Maintenance: Understanding of the daily nutrition guidelines, which includes 25-35% calories from fat, 7% or less cal from saturated fats, less than 254m cholesterol, less than 1.5gm of sodium, & 5 or more servings of fruits and vegetables daily   Diabetes Yes  LAst A1C over 9%, has changed nutriton habits significantly since. FBA are 80s now were over 110   Intervention Provide education about signs/symptoms and action to take for hypo/hyperglycemia.;Provide education about proper nutrition, including hydration, and aerobic/resistive exercise prescription along with prescribed medications to achieve  blood glucose in normal ranges: Fasting glucose 65-99 mg/dL   Expected Outcomes Short Term: Participant verbalizes understanding of the signs/symptoms and immediate care of hyper/hypoglycemia, proper foot care and importance of medication, aerobic/resistive exercise and nutrition plan for blood glucose control.;Long Term: Attainment of HbA1C < 7%.   Hypertension Yes   Intervention Provide education on lifestyle modifcations including regular physical activity/exercise, weight management, moderate sodium restriction and increased consumption of fresh fruit, vegetables, and low fat dairy, alcohol moderation, and smoking cessation.;Monitor prescription use compliance.   Expected Outcomes Short Term: Continued assessment and intervention until BP is < 140/978mHG in hypertensive participants. < 130/8040mG in hypertensive participants with diabetes, heart failure or chronic kidney disease.;Long Term: Maintenance of blood pressure at goal levels.   Lipids Yes   Intervention Provide education and support for participant on nutrition & aerobic/resistive exercise along with prescribed medications to achieve LDL <43m31mDL >40mg56mExpected Outcomes Short Term: Participant states understanding of desired cholesterol values and is compliant with medications prescribed. Participant is following exercise prescription and nutrition guidelines.;Long Term: Cholesterol controlled with medications as prescribed, with individualized exercise RX and with personalized nutrition plan. Value goals: LDL < 43mg,4m > 40 mg.      Core Components/Risk Factors/Patient Goals Review:      Goals and Risk Factor Review    Row Name 12/17/16 [phone]  Core Components/Risk Factors/Patient Goals Review   Personal Goals Review Weight Management/Obesity;Hypertension;Lipids;Diabetes       Review Barbarann Ehlers has been doing well in rehab.  He is still feeling like his energy levels have not completely returned yet.  His weight has  been steady.  His blood pressures and blood sugars have all done very well in rehab.  His A1C is down to 6.3!  He is tolerating his statin well this time around .       Expected Outcomes Short: Continue to keep eye on blood sugars.  Long: Continue to work on risk factor modification          Core Components/Risk Factors/Patient Goals at Discharge (Final Review):      Goals and Risk Factor Review - 12/17/16 0953      Core Components/Risk Factors/Patient Goals Review   Personal Goals Review Weight Management/Obesity;Hypertension;Lipids;Diabetes   Review Barbarann Ehlers has been doing well in rehab.  He is still feeling like his energy levels have not completely returned yet.  His weight has been steady.  His blood pressures and blood sugars have all done very well in rehab.  His A1C is down to 6.3!  He is tolerating his statin well this time around .   Expected Outcomes Short: Continue to keep eye on blood sugars.  Long: Continue to work on risk factor modification      ITP Comments:     ITP Comments    Row Name 11/10/16 1413 11/13/16 1345 11/14/16 0950 11/26/16 0537 12/24/16 0833   ITP Comments Medical review completed today. Initial ITP created.  Documentation of diagnossi can be found [facility] Encounter 09/22/2016 Called Mr. Muldrew about his concerns with rehab.  Apologized for confusion from first day.  Discussed with him how rehab is a safety net to get him back to where he was before his surgery.  Told him that we like to montior his exercise response with his new medication before he goes back at it on his own.  Also allows him to test his limits in a sae environment.  We talked some about the education side of rehab as well to learn more about his disease process and what it means going forward.  We discussed his goal of getting back to the gym regularlly and getting back out on his bike once cleared and how we can work towards these goals in rehab.  He voiced understanding to where we were coming from.   Mr. Dettore seemed more open to rehab today.  We tried to take our time and explain each step of our process today.  He rather enjoyed learning about the AED that Kayleen Memos presented today after questions from the group earlier this week.  We tried out the bike instead of the treadmill today and he said he got a good workout on  It. He also did the elliptical again today with good success despite not being his favorite piece of equipment.  After class I asked if things had gone better today and he said they did. 30 day review. Continue with ITP unless directed changes per Medical Director review 30 day review. Continue with ITP unless directed changes per Medical Director review   [place] Name 01/07/17 [phone]           ITP Comments Barbarann Ehlers did say that he felt that he was able to gain knowledge during the program from all of the education classes.          Comments: Discharge ITP

## 2017-01-21 ENCOUNTER — Ambulatory Visit: Payer: Medicare Other | Admitting: Internal Medicine

## 2017-01-27 NOTE — Telephone Encounter (Signed)
Patient is going to stay off metoprolol 25 mg for now since his bp readings have been good. 120/70. Patient stated he will discuss this with provider 02/17/2017.

## 2017-01-29 ENCOUNTER — Other Ambulatory Visit: Payer: Self-pay | Admitting: Family Medicine

## 2017-01-29 LAB — HM DIABETES EYE EXAM

## 2017-02-03 ENCOUNTER — Telehealth: Payer: Self-pay | Admitting: Internal Medicine

## 2017-02-03 NOTE — Telephone Encounter (Signed)
Please let Drs. Domingo Cocking know that it is fine for Mr. Kohlbeck to proceed with dental cleaning. He does not require any prophylactic antibiotics. He should remain on aspirin. Let me know if any questions or concerns arise. Thanks.  Nelva Bush, MD [facility] Pager: [phone]

## 2017-02-03 NOTE — Telephone Encounter (Signed)
Dr Domingo Cocking and [place] office (dentist) calling stating patient is there now with them  He is getting a cleaning done  Would like to know if patient is able to do this They may need to reschedule, but please call them and let them know as soon as we can   Please advise.

## 2017-02-03 NOTE — Telephone Encounter (Signed)
Notified Linda at Drs. Freeman's office that pt does not require prophylactic abx prior to dental cleaning. Left message w/information on pt's home VM

## 2017-02-03 NOTE — Telephone Encounter (Signed)
S/w Vaughan Basta, receptionist at Drs. [facility] Edelman and Charlynne Cousins, DDS.  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 They asked about abx, pt unaware if he was to pre-medicate. Dental office did not feel comfortable proceeding w/dental cleaning and have pt pt on wait list until confirmation if he needs pre-med.  Will route to Dr. Saunders Revel to advise and prescription if needed.  Dental office requests return phone call w/information.

## 2017-02-17 ENCOUNTER — Encounter: Payer: Self-pay | Admitting: Family Medicine

## 2017-02-17 ENCOUNTER — Ambulatory Visit (INDEPENDENT_AMBULATORY_CARE_PROVIDER_SITE_OTHER): Payer: Medicare Other | Admitting: Family Medicine

## 2017-02-17 VITALS — BP 110/70 | HR 52 | Temp 98.2°F | Resp 16 | Ht 76.0 in | Wt 210.0 lb

## 2017-02-17 DIAGNOSIS — E1140 Type 2 diabetes mellitus with diabetic neuropathy, unspecified: Secondary | ICD-10-CM

## 2017-02-17 DIAGNOSIS — L219 Seborrheic dermatitis, unspecified: Secondary | ICD-10-CM

## 2017-02-17 DIAGNOSIS — I10 Essential (primary) hypertension: Secondary | ICD-10-CM | POA: Diagnosis not present

## 2017-02-17 DIAGNOSIS — Z6825 Body mass index (BMI) 25.0-25.9, adult: Secondary | ICD-10-CM

## 2017-02-17 LAB — POCT GLYCOSYLATED HEMOGLOBIN (HGB A1C)
ESTIMATED AVERAGE GLUCOSE: 134
Hemoglobin A1C: 6.3

## 2017-02-17 MED ORDER — KETOCONAZOLE 2 % EX SHAM: MEDICATED_SHAMPOO | CUTANEOUS | 1 refills | Status: DC

## 2017-02-17 NOTE — Progress Notes (Signed)
Patient: Stuart Henry Male    DOB: 1940-11-18   76 y.o.   MRN: [id number] Visit Date: 02/17/2017  Today's Provider: [name], MD   Chief Complaint  Patient presents with  . Follow-up  . Hypertension  . Diabetes  . Coronary Artery Disease   Subjective:    HPI   Diabetes Mellitus Type II, Follow-up:   Lab Results  Component Value Date   HGBA1C 6.3 11/14/2016   HGBA1C 9.3 (H) 09/23/2016   HGBA1C 9.5 08/25/2016   Last seen for diabetes 3 months ago.  Management since then includes; reduced Jardiance and glipizide. He reports good compliance with treatment. He is not having side effects. none Current symptoms include none and have been unchanged. Home blood sugar records: fasting range: 99-121  Episodes of hypoglycemia? no   Current Insulin Regimen: n/a  Weight trend: stable Prior visit with dietician: no Current diet: in general, a "healthy" diet   Current exercise: walking  ----------------------------------------------------------------    Hypertension, follow-up:  BP Readings from Last 3 Encounters:  02/17/17 110/70  01/01/17 140/88  11/14/16 110/70    He was last seen for hypertension 3 months ago.  BP at that visit was 110/70. Management since that visit includes; per patient on 11/19/2016, patient put metoprolol on hold due to normal blood pressure readings.He reports good compliance with treatment. He is not having side effects. none He is exercising. He is adherent to low salt diet.   Outside blood pressures are 117/74. He is experiencing none.  Patient denies none.   Cardiovascular risk factors include diabetes mellitus.  Use of agents associated with hypertension: none.   ----------------------------------------------------------------  Coronary artery disease involving native coronary artery of native heart without angina pectoris From 11/14/2016-no changes.   Allergies  Allergen Reactions  . No Known Allergies       Current Outpatient Prescriptions:  .  ACCU-CHEK AVIVA PLUS test strip, Check blood sugar once  daily, Disp: 100 each, Rfl: 4 .  allopurinol (ZYLOPRIM) 100 MG tablet, TAKE 1 TABLET DAILY, Disp: 90 tablet, Rfl: 4 .  aspirin EC 81 MG tablet, Take 1 tablet (81 mg total) by mouth daily., Disp: 90 tablet, Rfl: 3 .  atorvastatin (LIPITOR) 40 MG tablet, Take 1 tablet (40 mg total) by mouth daily. (Patient taking differently: Take 40 mg by mouth at bedtime. ), Disp: 30 tablet, Rfl: 5 .  Blood Glucose Monitoring Suppl (ACCU-CHEK AVIVA PLUS) w/Device KIT, Use to check blood sugar daily as directed, Disp: 1 kit, Rfl: 0 .  carvedilol (COREG) 3.125 MG tablet, Take 1 tablet (3.125 mg total) by mouth 2 (two) times daily., Disp: 180 tablet, Rfl: 3 .  gabapentin (NEURONTIN) 300 MG capsule, TAKE ONE CAPSULE BY MOUTH AT BEDTIME FOR NEUROPATHY, Disp: 90 capsule, Rfl: 4 .  glipiZIDE (GLUCOTROL XL) 10 MG 24 hr tablet, 1/2 tablet daily, Disp: 1 tablet, Rfl: 3 .  JARDIANCE 25 MG TABS tablet, TAKE 1 TABLET DAILY (Patient taking differently: 1/2 tablet daily), Disp: 90 tablet, Rfl: 2 .  metFORMIN (GLUCOPHAGE) 1000 MG tablet, Take 1,000 mg by mouth 2 (two) times daily with a meal., Disp: , Rfl:  .  nitroGLYCERIN (NITROSTAT) 0.4 MG SL tablet, as needed., Disp: , Rfl:  .  pioglitazone (ACTOS) 45 MG tablet, Take 1 tablet (45 mg total) by mouth daily., Disp: 1 tablet, Rfl: 1 .  fluticasone (FLONASE) 50 MCG/ACT nasal spray, Place 2 sprays into both nostrils daily. (Patient taking differently: Place 2  sprays into both nostrils daily as needed for allergies. ), Disp: 16 g, Rfl: 6  Review of Systems  Constitutional: Negative for appetite change, chills and fever.  Respiratory: Negative for chest tightness, shortness of breath and wheezing.   Cardiovascular: Negative for chest pain and palpitations.  Gastrointestinal: Negative for abdominal pain, nausea and vomiting.    Social History  Substance Use Topics  . Smoking  status: Former Smoker    Packs/day: 1.00    Years: 5.00    Types: Cigarettes    Quit date: 08/06/1972  . Smokeless tobacco: Never Used  . Alcohol use No     Comment: 09/26/2016 "quit 01/30/12"   Objective:   BP 110/70 (BP Location: Right Arm, Patient Position: Sitting, Cuff Size: Large)   Pulse (!) 52   Temp 98.2 F (36.8 C) (Oral)   Resp 16   Ht '6\' 4"'  (1.93 m)   Wt 210 lb (95.3 kg)   SpO2 99%   BMI 25.56 kg/m  Vitals:   02/17/17 0831  BP: 110/70  Pulse: (!) 52  Resp: 16  Temp: 98.2 F (36.8 C)  TempSrc: Oral  SpO2: 99%  Weight: 210 lb (95.3 kg)  Height: '6\' 4"'  (1.93 m)     Physical Exam   General Appearance:    Alert, cooperative, no distress  Eyes:    PERRL, conjunctiva/corneas clear, EOM's intact       Lungs:     Clear to auscultation bilaterally, respirations unlabored  Heart:    Regular rate and rhythm  Neurologic:   Awake, alert, oriented x 3. No apparent focal neurological           defect.       Results for orders placed or performed in visit on 02/17/17  POCT glycosylated hemoglobin (Hb A1C)  Result Value Ref Range   Hemoglobin A1C 6.3    Est. average glucose Bld gHb Est-mCnc 134         Assessment & Plan:     1. Type 2 diabetes mellitus with diabetic neuropathy, without long-term current use of insulin (HCC) Well controlled.  Continue current medications.   - POCT glycosylated hemoglobin (Hb A1C)  2. Essential (primary) hypertension Well controlled.  Continue current medications.    3. BMI 25.0-25.9,adult Discussed appropriate exercise.   4. Seborrheic dermatitis  - ketoconazole (NIZORAL) 2 % shampoo; Use on scalp two times a week.  Dispense: 120 mL; Refill: 1       [name], MD  [facility]

## 2017-03-19 ENCOUNTER — Other Ambulatory Visit: Payer: Self-pay | Admitting: Family Medicine

## 2017-03-19 DIAGNOSIS — G629 Polyneuropathy, unspecified: Secondary | ICD-10-CM

## 2017-04-03 ENCOUNTER — Other Ambulatory Visit: Payer: Self-pay | Admitting: Family Medicine

## 2017-04-03 DIAGNOSIS — E1140 Type 2 diabetes mellitus with diabetic neuropathy, unspecified: Secondary | ICD-10-CM

## 2017-04-03 DIAGNOSIS — IMO0002 Reserved for concepts with insufficient information to code with codable children: Secondary | ICD-10-CM

## 2017-04-03 DIAGNOSIS — E1165 Type 2 diabetes mellitus with hyperglycemia: Principal | ICD-10-CM

## 2017-04-03 NOTE — Telephone Encounter (Signed)
[place] faxed refill request for the following medications: pioglitazone (ACTOS) 45 MG tablet 90 day supply  Last Rx: 08/25/16 1 refill LOV: 02/17/17 Please advise. Thanks TNP

## 2017-04-07 ENCOUNTER — Telehealth: Payer: Self-pay | Admitting: Family Medicine

## 2017-04-07 MED ORDER — PIOGLITAZONE HCL 45 MG PO TABS: 45.0000 mg | ORAL_TABLET | Freq: Every day | ORAL | 0 refills | Status: DC

## 2017-04-07 MED ORDER — PIOGLITAZONE HCL 45 MG PO TABS: 45.0000 mg | ORAL_TABLET | Freq: Every day | ORAL | 3 refills | Status: DC

## 2017-04-07 NOTE — Telephone Encounter (Signed)
Patient has ran out of Actos 45 mg.   He only needs 10 pills. Waiting on mail order to come in .    Call into [facility] on [address] [place] [address] Please.

## 2017-04-14 ENCOUNTER — Other Ambulatory Visit: Payer: Self-pay | Admitting: Family Medicine

## 2017-04-14 DIAGNOSIS — E1140 Type 2 diabetes mellitus with diabetic neuropathy, unspecified: Secondary | ICD-10-CM

## 2017-04-14 DIAGNOSIS — IMO0002 Reserved for concepts with insufficient information to code with codable children: Secondary | ICD-10-CM

## 2017-04-14 DIAGNOSIS — E1165 Type 2 diabetes mellitus with hyperglycemia: Principal | ICD-10-CM

## 2017-04-14 NOTE — Telephone Encounter (Signed)
Pt states [facility] states they need additional info from [place] to process the prescription for pioglitazone (ACTOS) 45 MG tablet.  Pt is requesting a new Rx sent to [facility] for a 90 day supply.     Pt also request a refill for the Rx atorvastatin (LIPITOR) 40 MG tablet.  90 day supply.  [facility]   CB#[phone] or [phone]

## 2017-04-14 NOTE — Telephone Encounter (Signed)
See message below °

## 2017-04-15 ENCOUNTER — Other Ambulatory Visit: Payer: Self-pay | Admitting: Internal Medicine

## 2017-04-15 ENCOUNTER — Other Ambulatory Visit: Payer: Self-pay | Admitting: Family Medicine

## 2017-04-15 MED ORDER — ATORVASTATIN CALCIUM 40 MG PO TABS: 40.0000 mg | ORAL_TABLET | Freq: Every day | ORAL | 4 refills | Status: DC

## 2017-04-15 MED ORDER — PIOGLITAZONE HCL 45 MG PO TABS: 45.0000 mg | ORAL_TABLET | Freq: Every day | ORAL | 4 refills | Status: DC

## 2017-04-15 NOTE — Telephone Encounter (Signed)
Please clarify. Are we supposed to do something about the prescription with [facility], or does he just want prescription sent to [facility] instead of using [facility].

## 2017-04-15 NOTE — Telephone Encounter (Signed)
Patient called back to check status of refills. He would like them sent to [place] today if possible. He would also like a call back when they are sent to pharmacy.

## 2017-04-15 NOTE — Telephone Encounter (Signed)
Prescription was sent to [facility], but does he need something done about the prescription with [facility], or is just going to be getting from [place] from now on?

## 2017-04-16 NOTE — Telephone Encounter (Signed)
Patient stated he is planning on using [facility] for these 2 meds.

## 2017-04-22 ENCOUNTER — Encounter: Payer: Self-pay | Admitting: Internal Medicine

## 2017-04-22 ENCOUNTER — Ambulatory Visit (INDEPENDENT_AMBULATORY_CARE_PROVIDER_SITE_OTHER): Payer: Medicare Other | Admitting: Internal Medicine

## 2017-04-22 VITALS — BP 120/68 | HR 59 | Ht 76.0 in | Wt 209.5 lb

## 2017-04-22 DIAGNOSIS — E1159 Type 2 diabetes mellitus with other circulatory complications: Secondary | ICD-10-CM

## 2017-04-22 DIAGNOSIS — I2510 Atherosclerotic heart disease of native coronary artery without angina pectoris: Secondary | ICD-10-CM

## 2017-04-22 DIAGNOSIS — I10 Essential (primary) hypertension: Secondary | ICD-10-CM | POA: Diagnosis not present

## 2017-04-22 DIAGNOSIS — I255 Ischemic cardiomyopathy: Secondary | ICD-10-CM | POA: Diagnosis not present

## 2017-04-22 DIAGNOSIS — R5383 Other fatigue: Secondary | ICD-10-CM

## 2017-04-22 NOTE — Patient Instructions (Signed)
Medication Instructions:  Your physician has recommended you make the following change in your medication:  1- STOP Carvedilol.   Labwork: none  Testing/Procedures: Your physician has requested that you have an echocardiogram. Echocardiography is a painless test that uses sound waves to create images of your heart. It provides your doctor with information about the size and shape of your heart and how well your heart's chambers and valves are working. This procedure takes approximately one hour. There are no restrictions for this procedure.    Follow-Up: Your physician recommends that you schedule a follow-up appointment in: [place] APP.   If you need a refill on your cardiac medications before your next appointment, please call your pharmacy.

## 2017-04-22 NOTE — Progress Notes (Signed)
Follow-up Outpatient Visit Date: 04/22/2017  Primary Care Provider: Birdie Sons, MD [address] [place] [address]  Chief Complaint: Follow-up coronary artery disease  HPI:  Stuart Henry is a 76 y.o. year-old male with history of coronary artery disease with progressive angina in early 2018 status post CABG (04/7025) complicated by postoperative atrial fibrillation, ischemic cardiomyopathy, type 2 diabetes mellitus, and hypertension, who presents for follow-up of coronary artery disease. I last saw Stuart Henry in late May, at which time he was doing very well. We agreed to start carvedilol 3.125 mg twice a day in the setting of mild hypertension, given history of CAD and mildly reduced LVEF prior to CABG.  Today, Stuart Henry reports that his energy level is not back to normal. He feels as though he does not have any strengthen his muscles, though he has started lifting weights again. He notes soreness and intermittent pulsating pains around his sternotomy cessation. He does not have any exertional chest pain nor shortness of breath. He denies leg edema and orthopnea. He has occasional brief fluttering in his chest, lasting only a few moments. He denies lightheadedness.  Stuart Henry notes that his blood sugars have been running relatively low. He feels particularly poor when his blood sugar is low.  --------------------------------------------------------------------------------------------------  Cardiovascular History & Procedures: Cardiovascular Problems:  Coronary artery disease status post CABG (09/2016)  Ischemic cardiomyopathy  Postoperative atrial fibrillation  Risk Factors:  Known coronary artery disease, diabetes mellitus, hypertension, age greater than 57, and male gender  Cath/PCI:  LHC (09/16/16): Significant two-vessel CAD with 95% proximal LAD stenosis involving ostium of D1, 90% ostial ramus intermedius stenosis, and at least 50% lesions involving  the ostium and mid section of the LCx. RCA with 25% distal stenosis. LVEF 55-60% with normal LVEDP.  CV Surgery:  CABG (09/24/16, Dr. Roxan Hockey): LIMA to LAD, SVG to D1, and sequential SVG to first and second branches of ramus intermedius.  EP Procedures and Devices:  None  Non-Invasive Evaluation(s):  Carotid Doppler (09/23/16): Mild (1-39%) stenoses involving the left and right internal carotid arteries. Vertebral arteries with antegrade flow.  ABIs (09/23/16): Right 1.98 and the left 1.49 suggestive of medial calcification.  Transthoracic echocardiogram (09/19/16): Normal LV size with mildly reduced contraction (EF 45-50%) with basal and mid anterior and anteroseptal hypokinesis. Grade 1 diastolic dysfunction. Mildly dilated left atrium. Normal RV size and function.  Recent CV Pertinent Labs: Lab Results  Component Value Date   CHOL 99 (L) 01/01/2017   HDL 38 (L) 01/01/2017   LDLCALC 40 01/01/2017   TRIG 103 01/01/2017   CHOLHDL 2.6 01/01/2017   INR 1.17 09/29/2016   K 4.3 09/27/2016   MG 1.9 09/25/2016   BUN 17 09/27/2016   BUN 27 09/10/2016   CREATININE 1.17 09/27/2016    Past medical and surgical history were reviewed and updated in EPIC.  Current Meds  Medication Sig  . ACCU-CHEK AVIVA PLUS test strip Check blood sugar once  daily  . allopurinol (ZYLOPRIM) 100 MG tablet TAKE 1 TABLET DAILY  . aspirin EC 81 MG tablet Take 1 tablet (81 mg total) by mouth daily.  Marland Kitchen atorvastatin (LIPITOR) 40 MG tablet TAKE 1 TABLET BY MOUTH ONCE DAILY  . Blood Glucose Monitoring Suppl (ACCU-CHEK AVIVA PLUS) w/Device KIT Use to check blood sugar daily as directed  . carvedilol (COREG) 3.125 MG tablet Take 1 tablet (3.125 mg total) by mouth 2 (two) times daily.  . fluticasone (FLONASE) 50 MCG/ACT nasal spray  Place 2 sprays into both nostrils daily. (Patient taking differently: Place 2 sprays into both nostrils daily as needed for allergies. )  . gabapentin (NEURONTIN) 300 MG capsule  TAKE ONE CAPSULE BY MOUTH AT BEDTIME FOR NEUROPATHY  . glipiZIDE (GLUCOTROL XL) 10 MG 24 hr tablet 1/2 tablet daily  . JARDIANCE 25 MG TABS tablet TAKE 1 TABLET DAILY (Patient taking differently: 1/2 tablet daily)  . ketoconazole (NIZORAL) 2 % shampoo Use on scalp two times a week.  . metFORMIN (GLUCOPHAGE) 1000 MG tablet Take 1,000 mg by mouth 2 (two) times daily with a meal.  . nitroGLYCERIN (NITROSTAT) 0.4 MG SL tablet as needed.  . pioglitazone (ACTOS) 45 MG tablet Take 1 tablet (45 mg total) by mouth daily.    Allergies: No known allergies  Social History   Social History  . Marital status: Married    Spouse name: N/A  . Number of children: N/A  . Years of education: N/A   Occupational History  . Retired    Social History Main Topics  . Smoking status: Former Smoker    Packs/day: 1.00    Years: 5.00    Types: Cigarettes    Quit date: 08/06/1972  . Smokeless tobacco: Never Used  . Alcohol use No     Comment: 09/26/2016 "quit 01/30/12"  . Drug use: No  . Sexual activity: No   Other Topics Concern  . Not on file   Social History Narrative  . No narrative on file    Family History  Problem Relation Age of Onset  . Dementia Mother   . Diabetes Mother   . Cancer Father   . Diabetes Sister   . Diabetes Brother   . Heart disease Other     Review of Systems: A 12-system review of systems was performed and was negative except as noted in the HPI.  --------------------------------------------------------------------------------------------------  Physical Exam: BP 120/68 (BP Location: Left Arm, Patient Position: Sitting, Cuff Size: Normal)   Pulse (!) 59   Ht '6\' 4"'  (1.93 m)   Wt 209 lb 8 oz (95 kg)   BMI 25.50 kg/m   General:  Well-developed, well-nourished man, seated comfortably in the exam room. He is accompanied by his wife. HEENT: No conjunctival pallor or scleral icterus. Moist mucous membranes.  OP clear. Neck: Supple without lymphadenopathy,  thyromegaly, JVD, or HJR.  Lungs: Normal work of breathing. Clear to auscultation bilaterally without wheezes or crackles. Heart: Regular rate and rhythm without murmurs, rubs, or gallops. Non-displaced PMI. Abd: Bowel sounds present. Soft, NT/ND without hepatosplenomegaly Ext: No lower extremity edema. Radial, PT, and DP pulses are 2+ bilaterally. Skin: Warm and dry without rash. Venous sternotomy is well-healed without tenderness or skin breakdown. No erythema.  EKG:  Sinus bradycardia (heart rate 59 bpm) with inferior Q waves. Heart rate has decreased from prior tracing. Otherwise, there has been no significant change since 01/01/17.  Lab Results  Component Value Date   WBC 6.8 09/27/2016   HGB 10.4 (L) 09/27/2016   HCT 31.4 (L) 09/27/2016   MCV 88.0 09/27/2016   PLT 123 (L) 09/27/2016    Lab Results  Component Value Date   NA 135 09/27/2016   K 4.3 09/27/2016   CL 98 (L) 09/27/2016   CO2 26 09/27/2016   BUN 17 09/27/2016   CREATININE 1.17 09/27/2016   GLUCOSE 113 (H) 09/27/2016   ALT 27 01/01/2017    Lab Results  Component Value Date   CHOL 99 (L) 01/01/2017  HDL 38 (L) 01/01/2017   LDLCALC 40 01/01/2017   TRIG 103 01/01/2017   CHOLHDL 2.6 01/01/2017    --------------------------------------------------------------------------------------------------  ASSESSMENT AND PLAN: Coronary artery disease without angina Stuart Henry has not had any recurrent angina or exertional dyspnea. He notes some soreness about his sternotomy incision, which will hopefully improve over the next several months. The incision itself appears well-healed. Given his fatigue, which seems a little more pronounced than at our last visit, we have agreed to discontinue carvedilol. We will continue with aspirin and atorvastatin. I have encouraged Stuart Henry to continue exercising.  Ischemic cardiomyopathy and fatigue Stuart Henry appears euvolemic today. LVEF prior to CABG with mildly reduced. Given his  more pronounced fatigue, we have agreed to perform an echocardiogram to ensure that his LVEF has not worsened.  Hyperlipidemia Goal LDL is less than 70; he was at target on last check in late May. We will continue atorvastatin 40 mg daily. If his generalized weakness continues following cessation of carvedilol, we will need to consider a statin holiday.  Diabetes mellitus Stuart Henry reports overall good blood pressure control with some episodes of hypoglycemia. His most recent hemoglobin A1c was 6.3. He is on multiple oral agents, including glipizide, Jardiance, and Actos. I encouraged him to speak with Dr. Caryn Section about coming off Actos, given its potential for negative cardiovascular effects. This will hopefully also help improve his hypoglycemia. I encourage continued use of Jardiance, given its beneficial cardiovascular effects.  Hypertension Blood pressure is well controlled today. As above, we will discontinue carvedilol.  Follow-up: Return to clinic in one month.  Nelva Bush, MD 04/22/2017 9:04 AM

## 2017-04-23 ENCOUNTER — Ambulatory Visit (INDEPENDENT_AMBULATORY_CARE_PROVIDER_SITE_OTHER): Payer: Medicare Other

## 2017-04-23 ENCOUNTER — Other Ambulatory Visit: Payer: Self-pay

## 2017-04-23 ENCOUNTER — Other Ambulatory Visit: Payer: Self-pay | Admitting: Internal Medicine

## 2017-04-23 DIAGNOSIS — I255 Ischemic cardiomyopathy: Secondary | ICD-10-CM

## 2017-04-23 DIAGNOSIS — R5383 Other fatigue: Secondary | ICD-10-CM

## 2017-04-23 DIAGNOSIS — Z951 Presence of aortocoronary bypass graft: Secondary | ICD-10-CM

## 2017-05-20 ENCOUNTER — Encounter: Payer: Self-pay | Admitting: Internal Medicine

## 2017-05-20 ENCOUNTER — Ambulatory Visit (INDEPENDENT_AMBULATORY_CARE_PROVIDER_SITE_OTHER): Payer: Medicare Other | Admitting: Internal Medicine

## 2017-05-20 VITALS — BP 100/70 | HR 70 | Ht 76.0 in | Wt 211.5 lb

## 2017-05-20 DIAGNOSIS — I255 Ischemic cardiomyopathy: Secondary | ICD-10-CM

## 2017-05-20 DIAGNOSIS — R5382 Chronic fatigue, unspecified: Secondary | ICD-10-CM

## 2017-05-20 DIAGNOSIS — R5383 Other fatigue: Secondary | ICD-10-CM | POA: Insufficient documentation

## 2017-05-20 DIAGNOSIS — E118 Type 2 diabetes mellitus with unspecified complications: Secondary | ICD-10-CM

## 2017-05-20 DIAGNOSIS — I2510 Atherosclerotic heart disease of native coronary artery without angina pectoris: Secondary | ICD-10-CM

## 2017-05-20 DIAGNOSIS — E785 Hyperlipidemia, unspecified: Secondary | ICD-10-CM | POA: Diagnosis not present

## 2017-05-20 NOTE — Progress Notes (Signed)
Follow-up Outpatient Visit Date: 05/20/2017  Primary Care Provider: Birdie Sons, MD [address] [place] [address]  Chief Complaint: Decreased energy  HPI:  Mr. Postlewait is a 76 y.o. year-old male with history of  coronary artery disease status post CABG (04/7672) complicated by postoperative atrial fibrillation, ischemic cardiomyopathy (LVEF normal by echo in 04/2017), type 2 diabetes mellitus, and hypertension, who presents for follow-up of coronary artery disease. I last saw Mr. Bogan about a month ago, at which time he reported that his energy level had not returned back to normal following CABG in the winter. We agreed to hold carvedilol and obtain an echo to ensure stable/improved LVEF.  Today,Mr. Schwartz reports that he is feeling relatively well. His energy is still not what it was several years ago, though it seems to have improved a little bit since we discontinued carvedilol last month. Is to [place] continues to exercise regularly, including cycling, working out at [employer], and playing golf. He does not have any exertional symptoms, including chest pain and shortness of breath. He denies edema, palpitations, and lightheadedness. Soreness along the lower portion of his sternotomy incision persists but is gradually improving. He remains compliant with his medications. Mr. Devan has not had a chance to follow-up with Dr. Caryn Section to readdress his diabetes medications.  --------------------------------------------------------------------------------------------------  Cardiovascular History & Procedures: Cardiovascular Problems:  Coronary artery disease status post CABG (09/2016)  Ischemic cardiomyopathy  Postoperative atrial fibrillation  Risk Factors:  Known coronary artery disease, diabetes mellitus, hypertension, age greater than 25, and male gender  Cath/PCI:  LHC (09/16/16): Significant two-vessel CAD with 95% proximal LAD stenosis involving ostium of  D1, 90% ostial ramus intermedius stenosis, and at least 50% lesions involving the ostium and mid section of the LCx. RCA with 25% distal stenosis. LVEF 55-60% with normal LVEDP.  CV Surgery:  CABG (09/24/16, Dr. Roxan Hockey): LIMA to LAD, SVG to D1, and sequential SVG to first and second branches of ramus intermedius.  EP Procedures and Devices:  None  Non-Invasive Evaluation(s):  Transthoracic echocardiogram (04/23/17): Normal LV size. LVEF 55-60% with anteroseptal hypokinesis (? related to prior cardiac surgery). Normal diastolic function. Borderline dilated aortic root (3.7 cm). Mild MR. Normal RV size and function. Normal pulmonary artery pressure.  Carotid Doppler (09/23/16): Mild (1-39%) stenoses involving the left and right internal carotid arteries. Vertebral arteries with antegrade flow.  ABIs (09/23/16): Right 1.98 and the left 1.49 suggestive of medial calcification.  Transthoracic echocardiogram (09/19/16): Normal LV size with mildly reduced contraction (EF 45-50%) with basal and mid anterior and anteroseptal hypokinesis. Grade 1 diastolic dysfunction. Mildly dilated left atrium. Normal RV size and function.  Recent CV Pertinent Labs: Lab Results  Component Value Date   CHOL 99 (L) 01/01/2017   HDL 38 (L) 01/01/2017   LDLCALC 40 01/01/2017   TRIG 103 01/01/2017   CHOLHDL 2.6 01/01/2017   INR 1.17 09/29/2016   K 4.3 09/27/2016   MG 1.9 09/25/2016   BUN 17 09/27/2016   BUN 27 09/10/2016   CREATININE 1.17 09/27/2016    Past medical and surgical history were reviewed and updated in EPIC.  Current Meds  Medication Sig  . ACCU-CHEK AVIVA PLUS test strip Check blood sugar once  daily  . allopurinol (ZYLOPRIM) 100 MG tablet TAKE 1 TABLET DAILY  . aspirin EC 81 MG tablet Take 1 tablet (81 mg total) by mouth daily.  Marland Kitchen atorvastatin (LIPITOR) 40 MG tablet TAKE 1 TABLET BY MOUTH ONCE DAILY  .  Blood Glucose Monitoring Suppl (ACCU-CHEK AVIVA PLUS) w/Device KIT Use to check  blood sugar daily as directed  . fluticasone (FLONASE) 50 MCG/ACT nasal spray Place 2 sprays into both nostrils daily. (Patient taking differently: Place 2 sprays into both nostrils daily as needed for allergies. )  . gabapentin (NEURONTIN) 300 MG capsule TAKE ONE CAPSULE BY MOUTH AT BEDTIME FOR NEUROPATHY  . glipiZIDE (GLUCOTROL XL) 10 MG 24 hr tablet 1/2 tablet daily  . JARDIANCE 25 MG TABS tablet TAKE 1 TABLET DAILY (Patient taking differently: 1/2 tablet daily)  . ketoconazole (NIZORAL) 2 % shampoo Use on scalp two times a week.  . metFORMIN (GLUCOPHAGE) 1000 MG tablet Take 1,000 mg by mouth 2 (two) times daily with a meal.  . nitroGLYCERIN (NITROSTAT) 0.4 MG SL tablet as needed.  . pioglitazone (ACTOS) 45 MG tablet Take 1 tablet (45 mg total) by mouth daily.    Allergies: No known allergies  Social History   Social History  . Marital status: Married    Spouse name: N/A  . Number of children: N/A  . Years of education: N/A   Occupational History  . Retired    Social History Main Topics  . Smoking status: Former Smoker    Packs/day: 1.00    Years: 5.00    Types: Cigarettes    Quit date: 08/06/1972  . Smokeless tobacco: Never Used  . Alcohol use No     Comment: 09/26/2016 "quit 01/30/12"  . Drug use: No  . Sexual activity: No   Other Topics Concern  . Not on file   Social History Narrative  . No narrative on file    Family History  Problem Relation Age of Onset  . Dementia Mother   . Diabetes Mother   . Cancer Father   . Diabetes Sister   . Diabetes Brother   . Heart disease Other     Review of Systems: Left knee pain following spinning last year. Discomfort has has gradually improved but has not completely resolved. Otherwise, a 12-system review of systems was performed and was negative except as noted in the HPI.  --------------------------------------------------------------------------------------------------  Physical Exam: BP 100/70 (BP Location: Left  Arm, Patient Position: Sitting, Cuff Size: Normal)   Pulse 70   Ht '6\' 4"'  (1.93 m)   Wt 211 lb 8 oz (95.9 kg)   BMI 25.74 kg/m   General:  Well-developed, well-nourished man, seated comfortably in the exam room. HEENT: No conjunctival pallor or scleral icterus. Moist mucous membranes.  OP clear. Neck: Supple without lymphadenopathy, thyromegaly, JVD, or HJR. Lungs: Normal work of breathing. Clear to auscultation bilaterally without wheezes or crackles. Heart: Regular rate and rhythm without murmurs, rubs, or gallops. Non-displaced PMI. Abd: Bowel sounds present. Soft, NT/ND without hepatosplenomegaly Ext: No lower extremity edema. Radial, PT, and DP pulses are 2+ bilaterally. Skin: Warm and dry without rash. Median sternotomy incision is well-healed.  EKG:  Normal sinus rhythm with inferior Q-waves. Heart rate has increased since prior tracing. Otherwise, there has been no significant interval change.  Lab Results  Component Value Date   WBC 6.8 09/27/2016   HGB 10.4 (L) 09/27/2016   HCT 31.4 (L) 09/27/2016   MCV 88.0 09/27/2016   PLT 123 (L) 09/27/2016    Lab Results  Component Value Date   NA 135 09/27/2016   K 4.3 09/27/2016   CL 98 (L) 09/27/2016   CO2 26 09/27/2016   BUN 17 09/27/2016   CREATININE 1.17 09/27/2016   GLUCOSE 113 (  H) 09/27/2016   ALT 27 01/01/2017    Lab Results  Component Value Date   CHOL 99 (L) 01/01/2017   HDL 38 (L) 01/01/2017   LDLCALC 40 01/01/2017   TRIG 103 01/01/2017   CHOLHDL 2.6 01/01/2017   --------------------------------------------------------------------------------------------------  ASSESSMENT AND PLAN: Coronary artery disease without angina No chest pain or shortness of breath to suggest worsening coronary insufficiency. We will continue Mr. Craze current regimen for secondary prevention. We will not restart a beta-blocker, given increased fatigue with carvedilol in the past, as well as low-normal blood pressure  today.  Ischemic cardiomyopathy Mr. Derflinger appears euvolemic with NYHA class II symptoms. Echo last month showed normalization of LVEF. I will defer adding ACEI/ARB given borderline low blood pressure. No beta-blocker, as detailed above.  Hyperlipidemia Conitnue high-intensity statin therpy; LDL at goal in May.  Diabetes mellitus As Mr. Digioia is on multiple oral diabetes medications, I have advised him to speak with Dr. Caryn Section about consolidating his regimen. In particular, I wold favor maximizing empagliflozin and weaning off pioglitazone, if possible.  Fatigue This is likely multifactorial but continues to improve gradually. I have encouraged Mr. Funes to remain active. We will hold off on adding back a beta-blocker, as above.  Follow-up: Return to clinic in 4 months.  Nelva Bush, MD 05/20/2017 11:40 AM

## 2017-05-20 NOTE — Patient Instructions (Signed)
Follow-Up: Your physician recommends that you schedule a follow-up appointment in: 4 months with Dr. End.  It was a pleasure seeing you today here in the office. Please do not hesitate to give us a call back if you have any further questions. [phone]  Pamela A. RN, BSN      

## 2017-06-01 ENCOUNTER — Other Ambulatory Visit: Payer: Self-pay | Admitting: Family Medicine

## 2017-06-16 NOTE — Progress Notes (Signed)
poct      Patient: Stuart Henry Male    DOB: April 26, 1941   76 y.o.   MRN: [id number] Visit Date: 06/17/2017  Today's Provider: [name], MD   Chief Complaint  Patient presents with  . Follow-up  . Diabetes  . Hypertension  . Hyperlipidemia  . Coronary Artery Disease   Subjective:    HPI   Diabetes Mellitus Type II, Follow-up:   Lab Results  Component Value Date   HGBA1C 6.3 02/17/2017   HGBA1C 6.3 11/14/2016   HGBA1C 9.3 (H) 09/23/2016   Last seen for diabetes 5 months ago.  Management since then includes; no changes. He reports good compliance with treatment. He is not having side effects. none Current symptoms include none and have been unchanged. Home blood sugar records: fasting range: 97-110 Is exercising regularly, riding bike, walking, and golfing Episodes of hypoglycemia? no   Current Insulin Regimen: n/a Most Recent Eye Exam: this year, Dr. Bing Plume  Weight trend: stable Prior visit with dietician: no Current diet: well balanced Current exercise: gym, golf, stationary bike  ------------------------------------------------------------------------   Hypertension, follow-up:  BP Readings from Last 3 Encounters:  06/17/17 120/80  05/20/17 100/70  04/22/17 120/68    He was last seen for hypertension 5 months ago.  BP at that visit was 110/70. Management since that visit includes; no changes.He reports good compliance with treatment. He is not having side effects. none He is exercising. He is adherent to low salt diet.   Outside blood pressures are 120/80. He is experiencing none.  Patient denies none.   Cardiovascular risk factors include diabetes mellitus.  Use of agents associated with hypertension: none.   ------------------------------------------------------------------------   CAD Continue regular follow up Dr. Saunders Revel who he saw last month. Has been taken off beta-blocker due to fatigue and relatively low blood pressures.  Is having  no chest pains or shortness of breath.   Allergies  Allergen Reactions  . No Known Allergies      Current Outpatient Medications:  .  ACCU-CHEK AVIVA PLUS test strip, CHECK BLOOD SUGAR ONCE  DAILY, Disp: 100 each, Rfl: 5 .  allopurinol (ZYLOPRIM) 100 MG tablet, TAKE 1 TABLET DAILY, Disp: 90 tablet, Rfl: 4 .  aspirin EC 81 MG tablet, Take 1 tablet (81 mg total) by mouth daily., Disp: 90 tablet, Rfl: 3 .  atorvastatin (LIPITOR) 40 MG tablet, TAKE 1 TABLET BY MOUTH ONCE DAILY, Disp: 30 tablet, Rfl: 11 .  Blood Glucose Monitoring Suppl (ACCU-CHEK AVIVA PLUS) w/Device KIT, Use to check blood sugar daily as directed, Disp: 1 kit, Rfl: 0 .  gabapentin (NEURONTIN) 300 MG capsule, TAKE ONE CAPSULE BY MOUTH AT BEDTIME FOR NEUROPATHY, Disp: 90 capsule, Rfl: 4 .  glipiZIDE (GLUCOTROL XL) 10 MG 24 hr tablet, 1/2 tablet daily, Disp: 1 tablet, Rfl: 3 .  JARDIANCE 25 MG TABS tablet, TAKE 1 TABLET DAILY (Patient taking differently: 1/2 tablet daily), Disp: 90 tablet, Rfl: 2 .  ketoconazole (NIZORAL) 2 % shampoo, Use on scalp two times a week., Disp: 120 mL, Rfl: 1 .  metFORMIN (GLUCOPHAGE) 1000 MG tablet, Take 1,000 mg by mouth 2 (two) times daily with a meal., Disp: , Rfl:  .  nitroGLYCERIN (NITROSTAT) 0.4 MG SL tablet, as needed., Disp: , Rfl:  .  pioglitazone (ACTOS) 45 MG tablet, Take 1 tablet (45 mg total) by mouth daily., Disp: 90 tablet, Rfl: 4 .  fluticasone (FLONASE) 50 MCG/ACT nasal spray, Place 2 sprays into both nostrils daily. (Patient not  taking: Reported on 06/17/2017), Disp: 16 g, Rfl: 6  Review of Systems  Constitutional: Negative for appetite change, chills and fever.  Respiratory: Negative for chest tightness, shortness of breath and wheezing.   Cardiovascular: Negative for chest pain and palpitations.  Gastrointestinal: Negative for abdominal pain, nausea and vomiting.    Social History   Tobacco Use  . Smoking status: Former Smoker    Packs/day: 1.00    Years: 5.00    Pack  years: 5.00    Types: Cigarettes    Last attempt to quit: 08/06/1972    Years since quitting: 44.8  . Smokeless tobacco: Never Used  Substance Use Topics  . Alcohol use: No    Alcohol/week: 0.0 oz    Comment: 09/26/2016 "quit 01/30/12"   Objective:   BP 120/80 (BP Location: Right Arm, Patient Position: Sitting, Cuff Size: Large)   Temp 98.1 F (36.7 C) (Oral)   Resp 16   Ht '6\' 4"'  (1.93 m)   Wt 217 lb (98.4 kg)   BMI 26.41 kg/m  Vitals:   06/17/17 0824  BP: 120/80  Resp: 16  Temp: 98.1 F (36.7 C)  TempSrc: Oral  Weight: 217 lb (98.4 kg)  Height: '6\' 4"'  (1.93 m)     Physical Exam   General Appearance:    Alert, cooperative, no distress  Eyes:    PERRL, conjunctiva/corneas clear, EOM's intact       Lungs:     Clear to auscultation bilaterally, respirations unlabored  Heart:    Regular rate and rhythm  Neurologic:   Awake, alert, oriented x 3. No apparent focal neurological           defect.       Results for orders placed or performed in visit on 06/17/17  POCT glycosylated hemoglobin (Hb A1C)  Result Value Ref Range   Hemoglobin A1C 5.8    Est. average glucose Bld gHb Est-mCnc 120   POCT UA - Microalbumin  Result Value Ref Range   Microalbumin Ur, POC Negative mg/L        Assessment & Plan:     1. Type 2 diabetes mellitus with complication, without long-term current use of insulin (HCC) Well controlled.  Will reduce pioglitazone.  - POCT glycosylated hemoglobin (Hb A1C) - POCT UA - Microalbumin - pioglitazone (ACTOS) 30 MG tablet; Take 1 tablet (30 mg total) daily by mouth.  Dispense: 90 tablet; Refill: 2  2. Essential (primary) hypertension Now off of all antihypertensives, likely benefiting from [place].   3. Coronary artery disease involving native coronary artery of native heart without angina pectoris Asymptomatic. Compliant with medication.  Continue aggressive risk factor modification.  Follow up Dr. Saunders Revel in February as scheduled.   4. BMI  26.0-26.9,adult Doing well with diet and exercise.   5. Hyperlipidemia LDL goal <70 He is tolerating atorvastatin well with no adverse effects.    Return in about 4 months (around 10/05/2017).        [name], MD  [facility]

## 2017-06-17 ENCOUNTER — Ambulatory Visit: Payer: Medicare Other | Admitting: Family Medicine

## 2017-06-17 ENCOUNTER — Encounter: Payer: Self-pay | Admitting: Family Medicine

## 2017-06-17 VITALS — BP 120/80 | Temp 98.1°F | Resp 16 | Ht 76.0 in | Wt 217.0 lb

## 2017-06-17 DIAGNOSIS — Z6826 Body mass index (BMI) 26.0-26.9, adult: Secondary | ICD-10-CM | POA: Diagnosis not present

## 2017-06-17 DIAGNOSIS — I10 Essential (primary) hypertension: Secondary | ICD-10-CM | POA: Diagnosis not present

## 2017-06-17 DIAGNOSIS — E785 Hyperlipidemia, unspecified: Secondary | ICD-10-CM

## 2017-06-17 DIAGNOSIS — E118 Type 2 diabetes mellitus with unspecified complications: Secondary | ICD-10-CM | POA: Diagnosis not present

## 2017-06-17 DIAGNOSIS — I2510 Atherosclerotic heart disease of native coronary artery without angina pectoris: Secondary | ICD-10-CM | POA: Diagnosis not present

## 2017-06-17 LAB — POCT GLYCOSYLATED HEMOGLOBIN (HGB A1C)
Est. average glucose Bld gHb Est-mCnc: 120
Hemoglobin A1C: 5.8

## 2017-06-17 LAB — POCT UA - MICROALBUMIN: MICROALBUMIN (UR) POC: NEGATIVE mg/L

## 2017-06-17 MED ORDER — PIOGLITAZONE HCL 30 MG PO TABS: 30.0000 mg | ORAL_TABLET | Freq: Every day | ORAL | 2 refills | Status: DC

## 2017-06-18 ENCOUNTER — Encounter: Payer: Self-pay | Admitting: Family Medicine

## 2017-08-14 ENCOUNTER — Ambulatory Visit (INDEPENDENT_AMBULATORY_CARE_PROVIDER_SITE_OTHER): Payer: Medicare Other

## 2017-08-14 VITALS — BP 140/74 | HR 68 | Temp 97.9°F | Ht 76.0 in | Wt 219.8 lb

## 2017-08-14 DIAGNOSIS — Z0000 Encounter for general adult medical examination without abnormal findings: Secondary | ICD-10-CM | POA: Diagnosis not present

## 2017-08-14 NOTE — Progress Notes (Signed)
Subjective:   Stuart Henry is a 77 y.o. male who presents for Medicare Annual/Subsequent preventive examination.  Review of Systems:  N/A  Cardiac Risk Factors include: male gender;diabetes mellitus;advanced age (>75mn, >>38women);dyslipidemia;hypertension     Objective:    Vitals: BP 140/74 (BP Location: Left Arm)   Pulse 68   Temp 97.9 F (36.6 C) (Oral)   Ht '6\' 4"'  (1.93 m)   Wt 219 lb 12.8 oz (99.7 kg)   BMI 26.75 kg/m   Body mass index is 26.75 kg/m.  Advanced Directives 08/14/2017 11/14/2016 11/10/2016 09/26/2016 09/23/2016 09/16/2016 02/09/2015  Does Patient Have a Medical Advance Directive? Yes No No No No No No  Type of [place]amed[place]of [place] will - - - - - -  Copy of [place] Chart? No - copy requested - - - - - -  Would patient like information on creating a medical advance directive? - Yes (ED - Information included in AVS) (No Data) Yes (Inpatient - patient defers creating a medical advance directive at this time) No - Patient declined - -    Tobacco Social History   Tobacco Use  Smoking Status Former Smoker  . Packs/day: 1.00  . Years: 5.00  . Pack years: 5.00  . Types: Cigarettes  . Last attempt to quit: 08/06/1972  . Years since quitting: 45.0  Smokeless Tobacco Never Used     Counseling given: Not Answered   Clinical Intake:  Pre-visit preparation completed: Yes  Pain : No/denies pain Pain Score: 0-No pain     Nutritional Status: BMI 25 -29 Overweight Nutritional Risks: None Diabetes: Yes CBG done?: No Did pt. bring in CBG monitor from home?: No  How often do you need to have someone help you when you read instructions, pamphlets, or other written materials from your doctor or pharmacy?: 1 - Never  Interpreter Needed?: No  Information [facility] :: [facility]  Past Medical History:  Diagnosis Date  . History of gout    "controlled w/daily RX:" (09/26/2016)  . History of hiatal  hernia    S/P OR   Past Surgical History:  Procedure Laterality Date  . ANTERIOR CERVICAL DECOMP/DISCECTOMY FUSION     "titanium disc replacemen; ?C3t"  . [place]  . CATARACT EXTRACTION W/ INTRAOCULAR LENS  IMPLANT, BILATERAL Bilateral   . COLONOSCOPY WITH PROPOFOL N/A 02/09/2015   Procedure: COLONOSCOPY WITH PROPOFOL;  Surgeon: RManya Silvas MD;  [facility]  Service: Endoscopy;  Laterality: N/A;  . 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 EXPLORATION POST OPERATIVE OPEN HEART    . HIATAL HERNIA REPAIR  ~ 2003   "laparoscopic"  . KNEE ARTHROSCOPY Left   . LEFT HEART CATH AND CORONARY ANGIOGRAPHY Left 09/16/2016   Procedure: Left Heart Cath and Coronary Angiography;  Surgeon: CNelva Bush MD;  [facility];  Service: Cardiovascular;  Laterality: Left;  . PATELLAR TENDON REPAIR Right   . TEE WITHOUT CARDIOVERSION N/A 09/24/2016   Procedure: TRANSESOPHAGEAL ECHOCARDIOGRAM (TEE);  Surgeon: SMelrose Nakayama MD;  [place]  Service: Open Heart Surgery;  Laterality: N/A;   Family  History  Problem Relation Age of Onset  . Dementia Mother   . Diabetes Mother   . Cancer Father   . Diabetes Sister   . Diabetes Brother   . Heart disease Other    Social History   Socioeconomic History  . Marital status: Married    Spouse name: None  . Number of children: 0  . Years of education: None  . Highest education level: Bachelor's degree (e.g., BA, AB, BS)  Social Needs  . Financial resource strain: Not hard at all  . Food insecurity - worry: Never true  . Food insecurity - inability: Never true  . Transportation needs -  medical: No  . Transportation needs - non-medical: No  Occupational History  . Occupation: Retired  Tobacco Use  . Smoking status: Former Smoker    Packs/day: 1.00    Years: 5.00    Pack years: 5.00    Types: Cigarettes    Last attempt to quit: 08/06/1972    Years since quitting: 45.0  . Smokeless tobacco: Never Used  Substance and Sexual Activity  . Alcohol use: No    Alcohol/week: 0.0 oz    Comment: 09/26/2016 "quit 01/30/12"  . Drug use: No  . Sexual activity: No  Other Topics Concern  . None  Social History Narrative  . None    Outpatient Encounter Medications as of 08/14/2017  Medication Sig  . ACCU-CHEK AVIVA PLUS test strip CHECK BLOOD SUGAR ONCE  DAILY  . allopurinol (ZYLOPRIM) 100 MG tablet TAKE 1 TABLET DAILY  . aspirin EC 81 MG tablet Take 1 tablet (81 mg total) by mouth daily.  Marland Kitchen atorvastatin (LIPITOR) 40 MG tablet TAKE 1 TABLET BY MOUTH ONCE DAILY  . Blood Glucose Monitoring Suppl (ACCU-CHEK AVIVA PLUS) w/Device KIT Use to check blood sugar daily as directed  . gabapentin (NEURONTIN) 300 MG capsule TAKE ONE CAPSULE BY MOUTH AT BEDTIME FOR NEUROPATHY  . glipiZIDE (GLUCOTROL XL) 10 MG 24 hr tablet 1/2 tablet daily  . JARDIANCE 25 MG TABS tablet TAKE 1 TABLET DAILY (Patient taking differently: 1/2 tablet daily)  . ketoconazole (NIZORAL) 2 % shampoo Use on scalp two times a week.  . metFORMIN (GLUCOPHAGE) 1000 MG tablet Take 1,000 mg by mouth 2 (two) times daily with a meal.  . pioglitazone (ACTOS) 30 MG tablet Take 1 tablet (30 mg total) daily by mouth.  . vitamin B-12 (CYANOCOBALAMIN) 100 MCG tablet Take by mouth daily.  . nitroGLYCERIN (NITROSTAT) 0.4 MG SL tablet as needed.  . [DISCONTINUED] fluticasone (FLONASE) 50 MCG/ACT nasal spray Place 2 sprays into both nostrils daily. (Patient not taking: Reported on 06/17/2017)   No facility-administered encounter medications on file as of 08/14/2017.     Activities of Daily Living In your present state of health, do you  have any difficulty performing the following activities: 08/14/2017 11/14/2016  Hearing? Y N  Comment Pt declines referral to audiologist.  -  Vision? N N  Comment - -  Difficulty concentrating or making decisions? N N  Comment - -  Walking or climbing stairs? Y N  Comment Due to knee and hip pain. -  Dressing or bathing? N N  Doing errands, shopping? N N  Preparing Food and eating ? N N  Using the Toilet? N N  In the past six months, have you accidently leaked urine? N N  Do you have problems with loss of bowel control? N N  Managing your Medications? N N  Managing your Finances? N  N  Housekeeping or managing your Housekeeping? N N  Some recent data might be hidden    Patient Care Team: Birdie Sons, MD as PCP - General (Family Medicine) End, Harrell Gave, MD as Consulting Physician (Cardiology) Calvert Cantor, MD as Consulting Physician (Ophthalmology)   Assessment:   This is a routine wellness examination for Elvis.  Exercise Activities and Dietary recommendations Current Exercise Habits: Structured exercise class;Home exercise routine, Type of exercise: Other - see comments;treadmill;strength training/weights;walking(rides stationary bicycle, uses eliptical and plays gold once weekly), Time (Minutes): 45(to 1 hour), Frequency (Times/Week): 3, Weekly Exercise (Minutes/Week): 135, Intensity: Moderate, Exercise limited by: orthopedic condition(s)  Goals    . DIET - INCREASE WATER INTAKE     Recommend increasing water intake to 2-4 glasses a day.        Fall Risk Fall Risk  08/14/2017 11/14/2016 11/10/2016 05/13/2016 02/23/2015  Falls in the past year? No No No No No   Is the patient's home free of loose throw rugs in walkways, pet beds, electrical cords, etc?   yes      Grab bars in the bathroom? no, pt feel it is unnecessary at this time.       Handrails on the stairs?   yes      Adequate lighting?   yes  Timed Get Up and Go Performed: N/A  Depression Screen PHQ 2/9  Scores 08/14/2017 01/05/2017 11/14/2016 11/10/2016  PHQ - 2 Score 0 2 0 0  PHQ- 9 Score - '4 3 1    ' Cognitive Function: Pt declined screening today.      6CIT Screen 11/14/2016  What Year? 0 points  What month? 0 points  What time? 0 points  Count back from 20 0 points  Months in reverse 0 points  Repeat phrase 2 points  Total Score 2    Immunization History  Administered Date(s) Administered  . Influenza,inj,Quad PF,6+ Mos 05/02/2016  . Influenza-Unspecified 05/08/2017  . Pneumococcal Conjugate-13 03/10/2014  . Pneumococcal Polysaccharide-23 08/31/2006, 03/02/2012  . Td 08/04/2002  . Tdap 03/10/2014  . Zoster 06/16/2011    Qualifies for Shingles Vaccine? Due for Shingles vaccine. Declined my offer to administer today. Education has been provided regarding the importance of this vaccine. Pt has been advised to call her insurance company to determine her out of pocket expense. Advised she may also receive this vaccine at her local pharmacy or Health Dept. Verbalized acceptance and understanding.  Screening Tests Health Maintenance  Topic Date Due  . FOOT EXAM  01/11/1951  . HEMOGLOBIN A1C  12/15/2017  . OPHTHALMOLOGY EXAM  01/29/2018  . URINE MICROALBUMIN  06/17/2018  . COLONOSCOPY  02/09/2020  . TETANUS/TDAP  03/10/2024  . INFLUENZA VACCINE  Completed  . PNA vac Low Risk Adult  Completed   Cancer Screenings: Lung: Low Dose CT Chest recommended if Age 69-80 years, 30 pack-year currently smoking OR have quit w/in 15years. Patient does not qualify. Colorectal: Up to date  Additional Screenings:  Hepatitis B/HIV/Syphillis: Pt declines today.  Hepatitis C Screening: Pt declines today.     Plan:  I have personally reviewed and addressed the Medicare Annual Wellness questionnaire and have noted the following in the patient's chart:  A. Medical and social history B. Use of alcohol, tobacco or illicit drugs  C. Current medications and supplements D. Functional ability and  status E.  Nutritional status F.  Physical activity G. Advance directives H. List of other physicians I.  Hospitalizations, surgeries, and ER visits in previous 49  months J.  Vitals K. Screenings such as hearing and vision if needed, cognitive and depression L. Referrals and appointments - none  In addition, I have reviewed and discussed with patient certain preventive protocols, quality metrics, and best practice recommendations. A written personalized care plan for preventive services as well as general preventive health recommendations were provided to patient.  See attached scanned questionnaire for additional information.   Signed,  Fabio Neighbors, LPN Nurse Health Advisor   Nurse Recommendations: Pt needs a diabetic foot exam at next OV.

## 2017-08-14 NOTE — Patient Instructions (Signed)
Stuart Henry , Thank you for taking time to come for your Medicare Wellness Visit. I appreciate your ongoing commitment to your health goals. Please review the following plan we discussed and let me know if I can assist you in the future.   Screening recommendations/referrals: Colonoscopy: Up to date Recommended yearly ophthalmology/optometry visit for glaucoma screening and checkup Recommended yearly dental visit for hygiene and checkup  Vaccinations: Influenza vaccine: Up to date Pneumococcal vaccine: Up to date.decshin Tdap vaccine: Up to date Shingles vaccine: Pt declines today.     Advanced directives: Please bring a copy of your POA (Power of Attorney) and/or Living Will to your next appointment.   Conditions/risks identified: Recommend increasing water intake 4 glasses a day.   Next appointment: 08/17/17 @ 10:00 AM  Preventive Care 65 Years and Older, Male Preventive care refers to lifestyle choices and visits with your health care provider that can promote health and wellness. What does preventive care include?  A yearly physical exam. This is also called an annual well check.  Dental exams once or twice a year.  Routine eye exams. Ask your health care provider how often you should have your eyes checked.  Personal lifestyle choices, including:  Daily care of your teeth and gums.  Regular physical activity.  Eating a healthy diet.  Avoiding tobacco and drug use.  Limiting alcohol use.  Practicing safe sex.  Taking low doses of aspirin every day.  Taking vitamin and mineral supplements as recommended by your health care provider. What happens during an annual well check? The services and screenings done by your health care provider during your annual well check will depend on your age, overall health, lifestyle risk factors, and family history of disease. Counseling  Your health care provider may ask you questions about your:  Alcohol use.  Tobacco  use.  Drug use.  Emotional well-being.  Home and relationship well-being.  Sexual activity.  Eating habits.  History of falls.  Memory and ability to understand (cognition).  Work and work Statistician. Screening  You may have the following tests or measurements:  Height, weight, and BMI.  Blood pressure.  Lipid and cholesterol levels. These may be checked every 5 years, or more frequently if you are over 26 years old.  Skin check.  Lung cancer screening. You may have this screening every year starting at age 2 if you have a 30-pack-year history of smoking and currently smoke or have quit within the past 15 years.  Fecal occult blood test (FOBT) of the stool. You may have this test every year starting at age 62.  Flexible sigmoidoscopy or colonoscopy. You may have a sigmoidoscopy every 5 years or a colonoscopy every 10 years starting at age 66.  Prostate cancer screening. Recommendations will vary depending on your family history and other risks.  Hepatitis C blood test.  Hepatitis B blood test.  Sexually transmitted disease (STD) testing.  Diabetes screening. This is done by checking your blood sugar (glucose) after you have not eaten for a while (fasting). You may have this done every 1-3 years.  Abdominal aortic aneurysm (AAA) screening. You may need this if you are a current or former smoker.  Osteoporosis. You may be screened starting at age 60 if you are at high risk. Talk with your health care provider about your test results, treatment options, and if necessary, the need for more tests. Vaccines  Your health care provider may recommend certain vaccines, such as:  Influenza vaccine. This is  recommended every year.  Tetanus, diphtheria, and acellular pertussis (Tdap, Td) vaccine. You may need a Td booster every 10 years.  Zoster vaccine. You may need this after age 38.  Pneumococcal 13-valent conjugate (PCV13) vaccine. One dose is recommended after age  62.  Pneumococcal polysaccharide (PPSV23) vaccine. One dose is recommended after age 50. Talk to your health care provider about which screenings and vaccines you need and how often you need them. This information is not intended to replace advice given to you by your health care provider. Make sure you discuss any questions you have with your health care provider. Document Released: 08/17/2015 Document Revised: 04/09/2016 Document Reviewed: 05/22/2015 Elsevier Interactive Patient Education  2017 [place] Prevention in the Home Falls can cause injuries. They can happen to people of all ages. There are many things you can do to make your home safe and to help prevent falls. What can I do on the outside of my home?  Regularly fix the edges of walkways and driveways and fix any cracks.  Remove anything that might make you trip as you walk through a door, such as a raised step or threshold.  Trim any bushes or trees on the path to your home.  Use bright outdoor lighting.  Clear any walking paths of anything that might make someone trip, such as rocks or tools.  Regularly check to see if handrails are loose or broken. Make sure that both sides of any steps have handrails.  Any raised decks and porches should have guardrails on the edges.  Have any leaves, snow, or ice cleared regularly.  Use sand or salt on walking paths during winter.  Clean up any spills in your garage right away. This includes oil or grease spills. What can I do in the bathroom?  Use night lights.  Install grab bars by the toilet and in the tub and shower. Do not use towel bars as grab bars.  Use non-skid mats or decals in the tub or shower.  If you need to sit down in the shower, use a plastic, non-slip stool.  Keep the floor dry. Clean up any water that spills on the floor as soon as it happens.  Remove soap buildup in the tub or shower regularly.  Attach bath mats securely with double-sided  non-slip rug tape.  Do not have throw rugs and other things on the floor that can make you trip. What can I do in the bedroom?  Use night lights.  Make sure that you have a light by your bed that is easy to reach.  Do not use any sheets or blankets that are too big for your bed. They should not hang down onto the floor.  Have a firm chair that has side arms. You can use this for support while you get dressed.  Do not have throw rugs and other things on the floor that can make you trip. What can I do in the kitchen?  Clean up any spills right away.  Avoid walking on wet floors.  Keep items that you use a lot in easy-to-reach places.  If you need to reach something above you, use a strong step stool that has a grab bar.  Keep electrical cords out of the way.  Do not use floor polish or wax that makes floors slippery. If you must use wax, use non-skid floor wax.  Do not have throw rugs and other things on the floor that can make you trip.  What can I do with my stairs?  Do not leave any items on the stairs.  Make sure that there are handrails on both sides of the stairs and use them. Fix handrails that are broken or loose. Make sure that handrails are as long as the stairways.  Check any carpeting to make sure that it is firmly attached to the stairs. Fix any carpet that is loose or worn.  Avoid having throw rugs at the top or bottom of the stairs. If you do have throw rugs, attach them to the floor with carpet tape.  Make sure that you have a light switch at the top of the stairs and the bottom of the stairs. If you do not have them, ask someone to add them for you. What else can I do to help prevent falls?  Wear shoes that:  Do not have high heels.  Have rubber bottoms.  Are comfortable and fit you well.  Are closed at the toe. Do not wear sandals.  If you use a stepladder:  Make sure that it is fully opened. Do not climb a closed stepladder.  Make sure that both  sides of the stepladder are locked into place.  Ask someone to hold it for you, if possible.  Clearly mark and make sure that you can see:  Any grab bars or handrails.  First and last steps.  Where the edge of each step is.  Use tools that help you move around (mobility aids) if they are needed. These include:  Canes.  Walkers.  Scooters.  Crutches.  Turn on the lights when you go into a dark area. Replace any light bulbs as soon as they burn out.  Set up your furniture so you have a clear path. Avoid moving your furniture around.  If any of your floors are uneven, fix them.  If there are any pets around you, be aware of where they are.  Review your medicines with your doctor. Some medicines can make you feel dizzy. This can increase your chance of falling. Ask your doctor what other things that you can do to help prevent falls. This information is not intended to replace advice given to you by your health care provider. Make sure you discuss any questions you have with your health care provider. Document Released: 05/17/2009 Document Revised: 12/27/2015 Document Reviewed: 08/25/2014 Elsevier Interactive Patient Education  2017 [employer].

## 2017-08-17 ENCOUNTER — Ambulatory Visit (INDEPENDENT_AMBULATORY_CARE_PROVIDER_SITE_OTHER): Payer: Medicare Other | Admitting: Family Medicine

## 2017-08-17 ENCOUNTER — Encounter: Payer: Self-pay | Admitting: Family Medicine

## 2017-08-17 VITALS — BP 132/72 | HR 58 | Temp 98.0°F | Resp 16 | Ht 76.0 in | Wt 217.0 lb

## 2017-08-17 DIAGNOSIS — E785 Hyperlipidemia, unspecified: Secondary | ICD-10-CM

## 2017-08-17 DIAGNOSIS — Z125 Encounter for screening for malignant neoplasm of prostate: Secondary | ICD-10-CM | POA: Diagnosis not present

## 2017-08-17 DIAGNOSIS — E1129 Type 2 diabetes mellitus with other diabetic kidney complication: Secondary | ICD-10-CM | POA: Diagnosis not present

## 2017-08-17 DIAGNOSIS — I10 Essential (primary) hypertension: Secondary | ICD-10-CM | POA: Diagnosis not present

## 2017-08-17 DIAGNOSIS — IMO0002 Reserved for concepts with insufficient information to code with codable children: Secondary | ICD-10-CM

## 2017-08-17 DIAGNOSIS — E1165 Type 2 diabetes mellitus with hyperglycemia: Secondary | ICD-10-CM | POA: Diagnosis not present

## 2017-08-17 DIAGNOSIS — K219 Gastro-esophageal reflux disease without esophagitis: Secondary | ICD-10-CM

## 2017-08-17 DIAGNOSIS — I2510 Atherosclerotic heart disease of native coronary artery without angina pectoris: Secondary | ICD-10-CM | POA: Diagnosis not present

## 2017-08-17 DIAGNOSIS — Z0000 Encounter for general adult medical examination without abnormal findings: Secondary | ICD-10-CM

## 2017-08-17 NOTE — Patient Instructions (Signed)
   The CDC recommends two doses of Shingrix (the shingles vaccine) separated by 2 to 6 months for adults age 75 years and older. I recommend checking with your insurance plan regarding coverage for this vaccine.   

## 2017-08-17 NOTE — Progress Notes (Signed)
Patient: Stuart Henry, Male    DOB: 11-May-1941, 77 y.o.   MRN: [id number] Visit Date: 08/17/2017  Today's Provider: [name], MD   Chief Complaint  Patient presents with  . Annual Wellness Visit  . Diabetes  . Hypertension  . Hyperlipidemia   Subjective:   Patient saw McKenzie for AWV on 08/14/2017.   Annual physical exam Stuart Henry is a 77 y.o. male who presents today for health maintenance and complete physical. He feels well. He reports exercising regularly. He reports he is sleeping well. He plays eighteen holes of golf, bikes, and goes to gym weekly.  Colonoscopy- 02/09/2015. Colon polyps. Diverticulosis.    Diabetes Mellitus Type II, Follow-up:   Lab Results  Component Value Date   HGBA1C 5.8 06/17/2017   HGBA1C 6.3 02/17/2017   HGBA1C 6.3 11/14/2016   Last seen for diabetes 2 months ago.  Management since then includes; decreased pioglitazone to 30 mg qd. He reports good compliance with treatment. He is not having side effects.  Current symptoms include none and have been stable. Home blood sugar records: trend: fluctuating a bit  Episodes of hypoglycemia? no   Most Recent Eye Exam: up to date Weight trend: stable Prior visit with dietician: no Current diet: well balanced Current exercise: no regular exercise     Hypertension, follow-up:  BP Readings from Last 3 Encounters:  08/17/17 132/72  08/14/17 140/74  06/17/17 120/80    He was last seen for hypertension 2 months ago.  BP at that visit was 120/80. Management since that visit includes; no changes.He reports good compliance with treatment. He is not having side effects.  He is not exercising. He is adherent to low salt diet.   Outside blood pressures are checked daily. He is experiencing none.  Patient denies exertional chest pressure/discomfort, lower extremity edema and palpitations.   Cardiovascular risk factors include diabetes mellitus and dyslipidemia.     Lipid/Cholesterol, Follow-up:   Last seen for this 2 months ago.  Management since that visit includes; no changes.  Last Lipid Panel:    Component Value Date/Time   CHOL 99 (L) 01/01/2017 1005   TRIG 103 01/01/2017 1005   HDL 38 (L) 01/01/2017 1005   CHOLHDL 2.6 01/01/2017 1005   LDLCALC 40 01/01/2017 1005    He reports good compliance with treatment. He is not having side effects.   Wt Readings from Last 3 Encounters:  08/17/17 217 lb (98.4 kg)  08/14/17 219 lb 12.8 oz (99.7 kg)  06/17/17 217 lb (98.4 kg)    Polyneuropathy From 02/23/2015-restarted gabapentin (NEURONTIN) 300 MG capsule.  Lab Results  Component Value Date   PSA 0.5 03/10/2014     Review of Systems  Constitutional: Negative.   HENT: Negative.   Eyes: Negative.   Respiratory: Negative.   Cardiovascular: Negative.   Gastrointestinal: Negative.   Endocrine: Negative.   Genitourinary: Negative.   Musculoskeletal: Negative.   Skin: Negative.   Allergic/Immunologic: Negative.   Neurological: Positive for numbness.       Has neuropathy in both feet.   Hematological: Negative.   Psychiatric/Behavioral: Negative.     Social History      He  reports that he quit smoking about 45 years ago. His smoking use included cigarettes. He has a 5.00 pack-year smoking history. he has never used smokeless tobacco. He reports that he does not drink alcohol or use drugs.       Social History   Socioeconomic  History  . Marital status: Married    Spouse name: None  . Number of children: 0  . Years of education: None  . Highest education level: Bachelor's degree (e.g., BA, AB, BS)  Social Needs  . Financial resource strain: Not hard at all  . Food insecurity - worry: Never true  . Food insecurity - inability: Never true  . Transportation needs - medical: No  . Transportation needs - non-medical: No  Occupational History  . Occupation: Retired  Tobacco Use  . Smoking status: Former Smoker     Packs/day: 1.00    Years: 5.00    Pack years: 5.00    Types: Cigarettes    Last attempt to quit: 08/06/1972    Years since quitting: 45.0  . Smokeless tobacco: Never Used  Substance and Sexual Activity  . Alcohol use: No    Alcohol/week: 0.0 oz    Comment: 09/26/2016 "quit 01/30/12"  . Drug use: No  . Sexual activity: No  Other Topics Concern  . None  Social History Narrative  . None    Past Medical History:  Diagnosis Date  . History of gout    "controlled w/daily RX:" (09/26/2016)  . History of hiatal hernia    S/P OR     Patient Active Problem List   Diagnosis Date Noted  . Chronic fatigue 05/20/2017  . Ischemic cardiomyopathy 01/02/2017  . GERD (gastroesophageal reflux disease)   . Paroxysmal atrial fibrillation ([place]) 10/08/2016  . 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 Coronary artery disease 09/24/2016  . Allergic rhinitis 02/22/2015  . BPH (benign prostatic hyperplasia) 02/22/2015  . Dermatitis 02/22/2015  . H/O adenomatous polyp of colon 02/22/2015  . Skin lesion 02/22/2015  . Type II diabetes mellitus with renal manifestations, uncontrolled ([place]) 01/25/2009  . Polyneuropathy 07/18/2008  . Hyperlipidemia LDL goal <70 12/15/2006  . Type 2 diabetes mellitus with complication, without long-term current use of insulin ([place]) 08/04/2004  . Essential (primary) hypertension 08/04/2004  . 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 ANTERIOR CERVICAL DECOMP/DISCECTOMY FUSION     "titanium disc replacemen; ?C3t"  . BACK SURGERY    . [place], No stent  . CATARACT EXTRACTION W/ INTRAOCULAR LENS  IMPLANT, BILATERAL Bilateral   . COLONOSCOPY WITH PROPOFOL N/A 02/09/2015   Procedure: COLONOSCOPY WITH PROPOFOL;  Surgeon: Manya Silvas, MD;  Location: [facility] ENDOSCOPY;  Service: Endoscopy;  Laterality: N/A;  . CORONARY ARTERY BYPASS GRAFT N/A 09/24/2016   Procedure: CORONARY ARTERY BYPASS GRAFTING (CABG) x  four, using left internal mammary artery and right leg greater saphenous vein harvested endoscopically;  Surgeon: Melrose Nakayama, MD;  Location: [facility];  Service: Open Heart Surgery;  Laterality: N/A;  . EXPLORATION POST OPERATIVE OPEN HEART    . HIATAL HERNIA REPAIR  ~ 2003   "laparoscopic"  . KNEE ARTHROSCOPY Left   . LEFT HEART CATH AND CORONARY ANGIOGRAPHY Left 09/16/2016   Procedure: Left Heart Cath and Coronary Angiography;  Surgeon: Nelva Bush, MD;  Location: [facility];  Service: Cardiovascular;  Laterality: Left;  . PATELLAR TENDON REPAIR Right   . TEE WITHOUT CARDIOVERSION N/A 09/24/2016   Procedure: TRANSESOPHAGEAL ECHOCARDIOGRAM (TEE);  Surgeon: Melrose Nakayama, MD;  Location: [facility];  Service: Open Heart Surgery;  Laterality: N/A;    Family History        Family Status  Relation Name Status  . Mother  Deceased  .  Father  Deceased       Cancer of the jaw and throat  . Sister  Alive  . Brother  Alive  . Sister  Alive  . Sister  Alive  . Sister  Alive  . Other Gen Family hx (Not Specified)        His family history includes Cancer in his father; Dementia in his mother; Diabetes in his brother, mother, and sister; Heart disease in his other.     Allergies  Allergen Reactions  . No Known Allergies      Current Outpatient Medications:  .  ACCU-CHEK AVIVA PLUS test strip, CHECK BLOOD SUGAR ONCE  DAILY, Disp: 100 each, Rfl: 5 .  allopurinol (ZYLOPRIM) 100 MG tablet, TAKE 1 TABLET DAILY, Disp: 90 tablet, Rfl: 4 .  aspirin EC 81 MG tablet, Take 1 tablet (81 mg total) by mouth daily., Disp: 90 tablet, Rfl: 3 .  atorvastatin (LIPITOR) 40 MG tablet, TAKE 1 TABLET BY MOUTH ONCE DAILY, Disp: 30 tablet, Rfl: 11 .  Blood Glucose Monitoring Suppl (ACCU-CHEK AVIVA PLUS) w/Device KIT, Use to check blood sugar daily as directed, Disp: 1 kit, Rfl: 0 .  gabapentin (NEURONTIN) 300 MG capsule, TAKE ONE CAPSULE BY MOUTH AT BEDTIME FOR NEUROPATHY, Disp: 90 capsule,  Rfl: 4 .  glipiZIDE (GLUCOTROL XL) 10 MG 24 hr tablet, 1/2 tablet daily, Disp: 1 tablet, Rfl: 3 .  JARDIANCE 25 MG TABS tablet, TAKE 1 TABLET DAILY (Patient taking differently: 1/2 tablet daily), Disp: 90 tablet, Rfl: 2 .  ketoconazole (NIZORAL) 2 % shampoo, Use on scalp two times a week., Disp: 120 mL, Rfl: 1 .  metFORMIN (GLUCOPHAGE) 1000 MG tablet, Take 1,000 mg by mouth 2 (two) times daily with a meal., Disp: , Rfl:  .  nitroGLYCERIN (NITROSTAT) 0.4 MG SL tablet, as needed., Disp: , Rfl:  .  pioglitazone (ACTOS) 30 MG tablet, Take 1 tablet (30 mg total) daily by mouth., Disp: 90 tablet, Rfl: 2 .  vitamin B-12 (CYANOCOBALAMIN) 100 MCG tablet, Take by mouth daily., Disp: , Rfl:    Patient Care Team: Birdie Sons, MD as PCP - General (Family Medicine) End, Harrell Gave, MD as Consulting Physician (Cardiology) Calvert Cantor, MD as Consulting Physician (Ophthalmology)      Objective:   Vitals: BP 132/72 (BP Location: Right Arm, Patient Position: Sitting, Cuff Size: Normal)   Pulse (!) 58   Temp 98 F (36.7 C)   Resp 16   Ht '6\' 4"'  (1.93 m)   Wt 217 lb (98.4 kg)   SpO2 99%   BMI 26.41 kg/m    Vitals:   08/17/17 0955  BP: 132/72  Pulse: (!) 58  Resp: 16  Temp: 98 F (36.7 C)  SpO2: 99%  Weight: 217 lb (98.4 kg)  Height: '6\' 4"'  (1.93 m)     Physical Exam    General Appearance:    Alert, cooperative, no distress, appears stated age  Head:    Normocephalic, without obvious abnormality, atraumatic  Eyes:    PERRL, conjunctiva/corneas clear, EOM's intact, fundi    benign, both eyes       Ears:    Normal TM's and external ear canals, both ears  Nose:   Nares normal, septum midline, mucosa normal, no drainage   or sinus tenderness  Throat:   Lips, mucosa, and tongue normal; teeth and gums normal  Neck:   Supple, symmetrical, trachea midline, no adenopathy;       thyroid:  No enlargement/tenderness/nodules; no carotid  bruit or JVD  Back:     Symmetric, no curvature, ROM  normal, no CVA tenderness  Lungs:     Clear to auscultation bilaterally, respirations unlabored  Chest wall:    No tenderness or deformity  Heart:    Regular rate and rhythm, S1 and S2 normal, no murmur, rub   or gallop  Abdomen:     Soft, non-tender, bowel sounds active all four quadrants,    no masses, no organomegaly  Genitalia:    deferred  Rectal:    deferred  Extremities:   Extremities normal, atraumatic, no cyanosis or edema  Pulses:   2+ and symmetric all extremities  Skin:   Skin color, texture, turgor normal, no rashes or lesions  Lymph nodes:   Cervical, supraclavicular, and axillary nodes normal  Neurologic:   CNII-XII intact. Normal strength, sensation and reflexes      throughout    6CIT Screen 11/14/2016  What Year? 0 points  What month? 0 points  What time? 0 points  Count back from 20 0 points  Months in reverse 0 points  Repeat phrase 2 points  Total Score 2      Depression Screen PHQ 2/9 Scores 08/14/2017 01/05/2017 11/14/2016 11/10/2016  PHQ - 2 Score 0 2 0 0  PHQ- 9 Score - '4 3 1      ' Assessment & Plan:     Routine Health Maintenance and Physical Exam  Exercise Activities and Dietary recommendations Goals    . DIET - INCREASE WATER INTAKE     Recommend increasing water intake to 2-4 glasses a day.        Immunization History  Administered Date(s) Administered  . Influenza,inj,Quad PF,6+ Mos 05/02/2016  . Influenza-Unspecified 05/08/2017  . Pneumococcal Conjugate-13 03/10/2014  . Pneumococcal Polysaccharide-23 08/31/2006, 03/02/2012  . Td 08/04/2002  . Tdap 03/10/2014  . Zoster 06/16/2011    Health Maintenance  Topic Date Due  . FOOT EXAM  01/11/1953  . HEMOGLOBIN A1C  12/15/2017  . OPHTHALMOLOGY EXAM  01/29/2018  . URINE MICROALBUMIN  06/17/2018  . COLONOSCOPY  02/09/2020  . TETANUS/TDAP  03/10/2024  . INFLUENZA VACCINE  Completed  . PNA vac Low Risk Adult  Completed     Discussed health benefits of physical activity, and  encouraged him to engage in regular exercise appropriate for his age and condition.   1. Annual physical exam Doing well. Exercising regularly.   2. Essential (primary) hypertension Well controlled.  Continue current medications.    3. Coronary artery disease involving native coronary artery of native heart without angina pectoris Asymptomatic. Compliant with medication.  Continue aggressive risk factor modification.  Has follow up Dr. Saunders Revel next months.  - Lipid panel - Comprehensive metabolic panel  4. Gastroesophageal reflux disease, esophagitis presence not specified Well controlled.    5. Type II diabetes mellitus with renal manifestations, uncontrolled ([place]) Well controlled.  Consider reducing pioglitazone if a1c is below 6.4% - Hemoglobin A1c  6. Hyperlipidemia LDL goal <70 He is tolerating atorvastatin well with no adverse effects.    7. Prostate cancer screening  - PSA    [name], MD  [facility]

## 2017-08-18 LAB — LIPID PANEL
CHOL/HDL RATIO: 2.5 ratio (ref 0.0–5.0)
Cholesterol, Total: 91 mg/dL — ABNORMAL LOW (ref 100–199)
HDL: 36 mg/dL — AB (ref >39)
LDL Calculated: 37 mg/dL (ref 0–99)
TRIGLYCERIDES: 89 mg/dL (ref 0–149)
VLDL CHOLESTEROL CAL: 18 mg/dL (ref 5–40)

## 2017-08-18 LAB — COMPREHENSIVE METABOLIC PANEL
A/G RATIO: 2 (ref 1.2–2.2)
ALBUMIN: 4.7 g/dL (ref 3.5–4.8)
ALK PHOS: 39 IU/L (ref 39–117)
ALT: 21 IU/L (ref 0–44)
AST: 15 IU/L (ref 0–40)
BUN / CREAT RATIO: 17 (ref 10–24)
BUN: 19 mg/dL (ref 8–27)
Bilirubin Total: 0.7 mg/dL (ref 0.0–1.2)
CO2: 23 mmol/L (ref 20–29)
Calcium: 9.5 mg/dL (ref 8.6–10.2)
Chloride: 102 mmol/L (ref 96–106)
Creatinine, Ser: 1.11 mg/dL (ref 0.76–1.27)
GFR calc non Af Amer: 65 mL/min/{1.73_m2} (ref >59)
GFR, EST AFRICAN AMERICAN: 75 mL/min/{1.73_m2} (ref >59)
GLUCOSE: 115 mg/dL — AB (ref 65–99)
Globulin, Total: 2.3 g/dL (ref 1.5–4.5)
POTASSIUM: 4.6 mmol/L (ref 3.5–5.2)
Sodium: 141 mmol/L (ref 134–144)
TOTAL PROTEIN: 7 g/dL (ref 6.0–8.5)

## 2017-08-18 LAB — HEMOGLOBIN A1C
Est. average glucose Bld gHb Est-mCnc: 137 mg/dL
HEMOGLOBIN A1C: 6.4 % — AB (ref 4.8–5.6)

## 2017-08-18 LAB — PSA: Prostate Specific Ag, Serum: 0.4 ng/mL (ref 0.0–4.0)

## 2017-08-28 ENCOUNTER — Other Ambulatory Visit: Payer: Self-pay | Admitting: Family Medicine

## 2017-09-23 ENCOUNTER — Ambulatory Visit: Payer: Medicare Other | Admitting: Internal Medicine

## 2017-09-23 ENCOUNTER — Ambulatory Visit (INDEPENDENT_AMBULATORY_CARE_PROVIDER_SITE_OTHER): Payer: Medicare Other | Admitting: Internal Medicine

## 2017-09-23 ENCOUNTER — Encounter: Payer: Self-pay | Admitting: Internal Medicine

## 2017-09-23 VITALS — BP 122/62 | HR 60 | Ht 75.0 in | Wt 217.5 lb

## 2017-09-23 DIAGNOSIS — E785 Hyperlipidemia, unspecified: Secondary | ICD-10-CM | POA: Diagnosis not present

## 2017-09-23 DIAGNOSIS — E118 Type 2 diabetes mellitus with unspecified complications: Secondary | ICD-10-CM | POA: Diagnosis not present

## 2017-09-23 DIAGNOSIS — I255 Ischemic cardiomyopathy: Secondary | ICD-10-CM | POA: Diagnosis not present

## 2017-09-23 DIAGNOSIS — I2510 Atherosclerotic heart disease of native coronary artery without angina pectoris: Secondary | ICD-10-CM | POA: Diagnosis not present

## 2017-09-23 NOTE — Patient Instructions (Signed)
Medication Instructions:  No changes at this time.  Labwork: None  Testing/Procedures: None  Follow-Up: Your physician wants you to follow-up in: 6 months with Dr. Saunders Revel. You will receive a reminder letter in the mail two months in advance. If you don't receive a letter, please call our office to schedule the follow-up appointment.  It was a pleasure seeing you today here in the office. Please do not hesitate to give [place] a call back if you have any further questions. [place], BSN

## 2017-09-23 NOTE — Progress Notes (Signed)
Follow-up Outpatient Visit Date: 09/23/2017  Primary Care Provider: Birdie Sons, MD [id number] [place] [address]  Chief Complaint: Follow-up coronary artery disease  HPI:  [name] is a 77 y.o. year-old male with history of coronary artery disease status post CABG (03/8279) complicated by postoperative atrial fibrillation, ischemic cardiomyopathy (LVEF normal by echo in 04/2017), type 2 diabetes mellitus, and hypertension, who presents for follow-up of CAD. I last saw [name] in October, at which time he was doing well with the exception of some decreased energy. He was continuing to exercise and play golf on a regular basis, though he felt like he was not quite as energetic as he was a few years ago. Beta-blocker had previously been held to see if this would improve his symptoms. We did not make any medication changes at that time.  Today, [name] reports that his energy is getting better.  He is able to play a full round of golf and only feels a little bit fatigued towards the last few holes.  He has not been exercising regularly due to the poor weather.  He denies chest pain, shortness of breath, palpitations, lightheadedness, and edema.  He notes being "melancholy" a few days but denies depression.  He has noted some stressful times related to his niece and nephew.  He also notices continued skin sensitivity overlying his median sternotomy incision.  The wound itself is well-healed.  This has gotten gradually better since his surgery but continues to bother him at times.  --------------------------------------------------------------------------------------------------  Cardiovascular History & Procedures: Cardiovascular Problems:  Coronary artery disease status post CABG (09/2016)  Ischemic cardiomyopathy  Postoperative atrial fibrillation  Risk Factors:  Known coronary artery disease, diabetes mellitus, hypertension, age greater than 32, and male  gender  Cath/PCI:  LHC (09/16/16): Significant two-vessel CAD with 95% proximal LAD stenosis involving ostium of D1, 90% ostial ramus intermedius stenosis, and at least 50% lesions involving the ostium and mid section of the LCx. RCA with 25% distal stenosis. LVEF 55-60% with normal LVEDP.  CV Surgery:  CABG (09/24/16, Dr. Roxan Hockey): LIMA to LAD, SVG to D1, and sequential SVG to first and second branches of ramus intermedius.  EP Procedures and Devices:  None  Non-Invasive Evaluation(s):  Transthoracic echocardiogram (04/23/17): Normal LV size. LVEF 55-60% with anteroseptal hypokinesis (? related to prior cardiac surgery). Normal diastolic function. Borderline dilated aortic root (3.7 cm). Mild MR. Normal RV size and function. Normal pulmonary artery pressure.  Carotid Doppler (09/23/16): Mild (1-39%) stenoses involving the left and right internal carotid arteries. Vertebral arteries with antegrade flow.  ABIs (09/23/16): Right 1.98 and the left 1.49 suggestive of medial calcification.  Transthoracic echocardiogram (09/19/16): Normal LV size with mildly reduced contraction (EF 45-50%) with basal and mid anterior and anteroseptal hypokinesis. Grade 1 diastolic dysfunction. Mildly dilated left atrium. Normal RV size and function.  Recent CV Pertinent Labs: Lab Results  Component Value Date   CHOL 91 (L) 08/17/2017   HDL 36 (L) 08/17/2017   LDLCALC 37 08/17/2017   TRIG 89 08/17/2017   CHOLHDL 2.5 08/17/2017   INR 1.17 09/29/2016   K 4.6 08/17/2017   MG 1.9 09/25/2016   BUN 19 08/17/2017   CREATININE 1.11 08/17/2017    Past medical and surgical history were reviewed and updated in EPIC.  Current Meds  Medication Sig  . ACCU-CHEK AVIVA PLUS test strip CHECK BLOOD SUGAR ONCE  DAILY  . allopurinol (ZYLOPRIM) 100 MG tablet TAKE 1 TABLET DAILY  .  aspirin EC 81 MG tablet Take 1 tablet (81 mg total) by mouth daily.  Marland Kitchen atorvastatin (LIPITOR) 40 MG tablet TAKE 1 TABLET BY MOUTH ONCE  DAILY  . Blood Glucose Monitoring Suppl (ACCU-CHEK AVIVA PLUS) w/Device KIT USE TO CHECK BLOOD SUGAR  DAILY AS DIRECTED  . gabapentin (NEURONTIN) 300 MG capsule TAKE ONE CAPSULE BY MOUTH AT BEDTIME FOR NEUROPATHY  . glipiZIDE (GLUCOTROL XL) 10 MG 24 hr tablet 1/2 tablet daily  . JARDIANCE 25 MG TABS tablet TAKE 1 TABLET DAILY (Patient taking differently: 1/2 tablet daily)  . ketoconazole (NIZORAL) 2 % shampoo Use on scalp two times a week.  . metFORMIN (GLUCOPHAGE) 1000 MG tablet Take 1,000 mg by mouth 2 (two) times daily with a meal.  . nitroGLYCERIN (NITROSTAT) 0.4 MG SL tablet as needed.  . pioglitazone (ACTOS) 30 MG tablet Take 1 tablet (30 mg total) daily by mouth.  . vitamin B-12 (CYANOCOBALAMIN) 100 MCG tablet Take by mouth daily.    Allergies: No known allergies  Social History   Socioeconomic History  . Marital status: Married    Spouse name: Not on file  . Number of children: 0  . Years of education: Not on file  . Highest education level: Bachelor's degree (e.g., BA, AB, BS)  Social Needs  . Financial resource strain: Not hard at all  . Food insecurity - worry: Never true  . Food insecurity - inability: Never true  . Transportation needs - medical: No  . Transportation needs - non-medical: No  Occupational History  . Occupation: Retired  Tobacco Use  . Smoking status: Former Smoker    Packs/day: 1.00    Years: 5.00    Pack years: 5.00    Types: Cigarettes    Last attempt to quit: 08/06/1972    Years since quitting: 45.1  . Smokeless tobacco: Never Used  Substance and Sexual Activity  . Alcohol use: No    Alcohol/week: 0.0 oz    Comment: 09/26/2016 "quit 01/30/12"  . Drug use: No  . Sexual activity: No  Other Topics Concern  . Not on file  Social History Narrative  . Not on file    Family History  Problem Relation Age of Onset  . Dementia Mother   . Diabetes Mother   . Cancer Father   . Diabetes Sister   . Diabetes Brother   . Heart disease Other      Review of Systems: A 12-system review of systems was performed and was negative except as noted in the HPI.  --------------------------------------------------------------------------------------------------  Physical Exam: BP 122/62 (BP Location: Left Arm, Patient Position: Sitting, Cuff Size: Normal)   Pulse 60   Ht '6\' 3"'  (1.905 m)   Wt 217 lb 8 oz (98.7 kg)   BMI 27.19 kg/m   General: Well-developed, well-nourished man, seated comfortably in the exam room.  He is accompanied by his wife. HEENT: No conjunctival pallor or scleral icterus. Moist mucous membranes.  OP clear. Neck: Supple without lymphadenopathy, thyromegaly, JVD, or HJR. Lungs: Normal work of breathing. Clear to auscultation bilaterally without wheezes or crackles. Heart: Regular rate and rhythm without murmurs, rubs, or gallops. Non-displaced PMI. Abd: Bowel sounds present. Soft, NT/ND without hepatosplenomegaly Ext: No lower extremity edema. Radial, PT, and DP pulses are 2+ bilaterally. Skin: Warm and dry without rash.  Median sternotomy is well-healed.  EKG: Normal sinus rhythm with inferior Q waves and nonspecific T wave changes.  No significant change from prior tracing on 05/20/17.  Lab Results  Component Value Date   WBC 6.8 09/27/2016   HGB 10.4 (L) 09/27/2016   HCT 31.4 (L) 09/27/2016   MCV 88.0 09/27/2016   PLT 123 (L) 09/27/2016    Lab Results  Component Value Date   NA 141 08/17/2017   K 4.6 08/17/2017   CL 102 08/17/2017   CO2 23 08/17/2017   BUN 19 08/17/2017   CREATININE 1.11 08/17/2017   GLUCOSE 115 (H) 08/17/2017   ALT 21 08/17/2017    Lab Results  Component Value Date   CHOL 91 (L) 08/17/2017   HDL 36 (L) 08/17/2017   LDLCALC 37 08/17/2017   TRIG 89 08/17/2017   CHOLHDL 2.5 08/17/2017    --------------------------------------------------------------------------------------------------  ASSESSMENT AND PLAN: Coronary artery disease without angina No recurrent angina.  We  will continue Mr. Hathorne current medications, including aspirin and atorvastatin.  LDL at goal.  I encouraged increased exercise as the weather allows.  Ischemic cardiomyopathy Mr. Pineda appears euvolemic with NYHA class II symptoms.  We will continue holding off on addition of beta-blocker, given fatigue, and ACE inhibitor/ARB in the setting of borderline low blood pressure in the past and normal LVEF.  Hyperlipidemia LDL at goal.  Continue atorvastatin 40 mg daily.  Exercise encouraged to help improve HDL.  Type 2 diabetes mellitus Continued management, per Dr. Caryn Section.  Most recent hemoglobin A1c was excellent at 6.4.  Follow-up: Return to clinic in 6 months.  Nelva Bush, MD 09/23/2017 2:04 PM

## 2017-10-15 ENCOUNTER — Ambulatory Visit: Payer: Self-pay | Admitting: Family Medicine

## 2017-10-17 ENCOUNTER — Other Ambulatory Visit: Payer: Self-pay | Admitting: Family Medicine

## 2017-10-17 DIAGNOSIS — L219 Seborrheic dermatitis, unspecified: Secondary | ICD-10-CM

## 2017-10-30 ENCOUNTER — Encounter: Payer: Self-pay | Admitting: Thoracic Surgery (Cardiothoracic Vascular Surgery)

## 2017-10-30 NOTE — Progress Notes (Signed)
NAME:  Stuart Henry, Stuart Henry NO.:  [id number]  MEDICAL RECORD NO.:  [id number]  LOCATION:                                 FACILITY:  PHYSICIAN:  Revonda Standard. Roxan Hockey, M.D. 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  PROCEDURE:  Median sternotomy, extracorporeal circulation, coronary artery bypass grafting x4 (left internal mammary artery to left anterior descending, saphenous vein graft to first diagonal, saphenous vein graft to first and second branches of ramus intermedius), endoscopic vein harvest right thigh.  SURGEON:  Revonda Standard. Roxan Hockey, M.D.  ASSISTANT:  John Giovanni, P.A.-C.  ANESTHESIA:  General.  FINDINGS:  Preserved left ventricular function with no significant valvular pathology by TEE.  Good quality conduits.  The LAD and ramus 2 were intramyocardial.  Good quality targets.  CLINICAL NOTE:  Stuart Henry is a 77 year old man with multiple cardiac risk factors who had recent onset of angina.  At cardiac catheterization he had severe two-vessel coronary disease involving the LAD and a large ramus intermedius branch.  There was a 50% stenosis in a small atrioventricular groove circumflex branch.  There was no significant right coronary disease.  The patient was advised to undergo coronary artery bypass grafting.  The indications, risks, benefits, and alternatives were discussed in detail with the patient.  He understood and accepted the risks and agreed to proceed.  OPERATIVE NOTE:  Stuart Henry was brought to the preoperative holding area on September 24, 2016.  Anesthesia placed a Swan-Ganz catheter and an arterial blood pressure monitoring line.  He was taken to the operating room,  anesthetized and intubated.  A Foley catheter was placed. Intravenous antibiotics were administered.  Transesophageal echocardiography was performed.  It showed preserved left ventricular function with no significant valvular pathology.  The chest, abdomen, and legs were prepped and draped in the usual sterile fashion.  A median sternotomy was performed.  The left internal mammary artery was harvested using standard technique.  Simultaneously, an incision was made in the medial aspect of the right leg at the level of the knee. The greater saphenous vein was harvested endoscopically from the right thigh.  Both the saphenous vein and mammary artery were good quality vessels.  2000 units of heparin was administered during the vessel harvest.  The remainder of full heparin dose was given prior to opening the pericardium.  After harvesting the conduits, the pericardium was opened.  The ascending aorta was inspected.  There was no evidence of atherosclerotic disease.  The aorta was cannulated via concentric 2-0 Ethibond pledgeted pursestring sutures.  A dual-stage venous cannula was placed via a pursestring suture in the right atrial appendage.  Cardiopulmonary bypass was initiated.  Flows were maintained per protocol.  The patient was cooled to 32 degrees Celsius.  The coronary arteries were inspected and anastomotic sites were chosen.  There was no graftable component of the AV circumflex vessel.  The conduits were inspected and cut to length.  A foam pad was placed in  the pericardium to insulate the heart. A temperature probe was placed in myocardial septum and a cardioplegia cannula was placed in the ascending aorta.  The aorta was crossclamped.  The left ventricle was emptied via the aortic root vent.  Cardiac arrest was achieved with a combination of cold antegrade blood cardioplegia and topical iced saline.  1 L of cardioplegia was administered.  There was a rapid diastolic arrest  and subsequently cooling to 10 degrees Celsius.  A reversed saphenous vein graft was placed sequentially to the first and second branches of the ramus intermedius.  These vessels ran parallel and in relatively close proximity.  The second branch was intramyocardial. A side-to-side anastomosis was performed to the first branch and an end-to- side to the second.  Both were done with running 7-0 Prolene sutures. All anastomoses were probed proximally and distally before tying the suture to ensure patency.  Cardioplegia was administered down the graft. There were good flow and good hemostasis.  Next, a reversed saphenous vein graft was placed end-to-side to the first diagonal branch to the LAD.  This vessel was smaller, but was still good quality.  Did accept a 1.5 mm probe distally.  It was heavily diseased proximally.  The vein was good quality.  An end-to-side anastomosis was performed with a running 7-0 Prolene suture.  Again, there were good flow and good hemostasis with cardioplegia administration.  The left internal mammary artery was brought through a window in the pericardium.  The distal end was beveled, it was anastomosed end-to-side to the distal LAD.  Both the mammary and LAD were good quality vessels. The end-to-side anastomosis was performed with a running 8-0 Prolene suture.  At completion of anastomosis, the bulldog clamp was removed and rapid septal rewarming was noted.  The bulldog clamp was replaced.  The mammary pedicle was tacked to the epicardial surface of the heart with 6- 0 Prolene sutures.  Additional cardioplegia was administered.  The vein grafts were cut to length.  The cardioplegia cannula was removed from the ascending aorta. The proximal vein graft anastomoses were performed to 4.5 mm punch aortotomies with running 6-0 Prolene sutures.  At the completion of the final proximal anastomosis, the patient was placed in Trendelenburg position.  Lidocaine  was administered.  The bulldog clamp was again removed from the left mammary artery.  The aortic root was de-aired and the aortic crossclamp was removed.  Total crossclamp time was 68 minutes.  The patient spontaneously resumed sinus rhythm and did not require defibrillation.  A test dose of protamine was administered and was well tolerated.  The atrial and aortic cannulae were removed.  The remainder of the protamine was administered without incident.  Post bypass transesophageal echocardiography was unchanged from the prebypass study.  The chest was copiously irrigated with warm saline.  Hemostasis was achieved.  The pericardium was reapproximated over the ascending aorta and base of the heart with interrupted 3-0 silk sutures.  It came together easily without tension or kinking of the underlying grafts.  Left pleural and mediastinal chest tubes were placed through separate subcostal incisions.  The sternum was closed with a combination of single and double heavy gauge stainless steel wires.  The pectoralis fascia, subcutaneous tissue, and skin were closed in standard fashion.  All sponge, needle, and instrument counts were correct at the end of the procedure.  The patient was taken from the operating room to the Surgical Intensive Care Unit, intubated and in good condition.  Revonda Standard Roxan Hockey, M.D.     SCH/MEDQ  D:  09/25/2016  T:  09/25/2016  Job:  [id number]           Last signed by: Stuart Nakayama, MD at 10/14/2016 9:46 AM  Electronically signed by Stuart Nakayama, MD at 10/14/2016 9:46 AM  Addendum  Date of procedure corrected  Revonda Standard. Roxan Hockey, MD [facility] [phone]

## 2017-11-01 ENCOUNTER — Other Ambulatory Visit: Payer: Self-pay | Admitting: Physician Assistant

## 2017-11-01 ENCOUNTER — Other Ambulatory Visit: Payer: Self-pay | Admitting: Family Medicine

## 2017-11-01 DIAGNOSIS — E118 Type 2 diabetes mellitus with unspecified complications: Principal | ICD-10-CM

## 2017-11-01 DIAGNOSIS — IMO0002 Reserved for concepts with insufficient information to code with codable children: Secondary | ICD-10-CM

## 2017-11-01 DIAGNOSIS — E1165 Type 2 diabetes mellitus with hyperglycemia: Secondary | ICD-10-CM

## 2017-11-06 NOTE — Telephone Encounter (Signed)
This was in the nurse pool

## 2017-11-16 ENCOUNTER — Ambulatory Visit (INDEPENDENT_AMBULATORY_CARE_PROVIDER_SITE_OTHER): Payer: Medicare Other | Admitting: Family Medicine

## 2017-11-16 ENCOUNTER — Encounter: Payer: Self-pay | Admitting: Family Medicine

## 2017-11-16 VITALS — BP 110/64 | HR 72 | Temp 97.9°F | Resp 16 | Ht 75.0 in | Wt 216.0 lb

## 2017-11-16 DIAGNOSIS — E1129 Type 2 diabetes mellitus with other diabetic kidney complication: Secondary | ICD-10-CM

## 2017-11-16 DIAGNOSIS — E1165 Type 2 diabetes mellitus with hyperglycemia: Secondary | ICD-10-CM | POA: Diagnosis not present

## 2017-11-16 DIAGNOSIS — I10 Essential (primary) hypertension: Secondary | ICD-10-CM | POA: Diagnosis not present

## 2017-11-16 DIAGNOSIS — IMO0002 Reserved for concepts with insufficient information to code with codable children: Secondary | ICD-10-CM

## 2017-11-16 DIAGNOSIS — R0609 Other forms of dyspnea: Secondary | ICD-10-CM

## 2017-11-16 DIAGNOSIS — I2510 Atherosclerotic heart disease of native coronary artery without angina pectoris: Secondary | ICD-10-CM

## 2017-11-16 LAB — POCT GLYCOSYLATED HEMOGLOBIN (HGB A1C)
ESTIMATED AVERAGE GLUCOSE: 143
HEMOGLOBIN A1C: 6.6

## 2017-11-16 MED ORDER — EMPAGLIFLOZIN 10 MG PO TABS: 10.0000 mg | ORAL_TABLET | Freq: Every day | ORAL | 12 refills | Status: DC

## 2017-11-16 NOTE — Progress Notes (Signed)
Patient: Stuart Henry Male    DOB: 02/09/1941   77 y.o.   MRN: [id number] Visit Date: 11/16/2017  Today's Provider: [name], MD   Chief Complaint  Patient presents with  . Follow-up  . Diabetes  . Hypertension   Subjective:    HPI   Diabetes Mellitus Type II, Follow-up:   Lab Results  Component Value Date   HGBA1C 6.4 (H) 08/17/2017   HGBA1C 5.8 06/17/2017   HGBA1C 6.3 02/17/2017   Last seen for diabetes 3 months ago.  Management since then includes; no changes. He reports good compliance with treatment. He is not having side effects. none Current symptoms include none and have been unchanged. Home blood sugar records: fasting range: 93-130  He does occasionally take extra 5m glipizide per day.  Episodes of hypoglycemia? no   Current Insulin Regimen: n/a Most Recent Eye Exam: 01/29/2017 Weight trend: stable Prior visit with dietician: no Current diet: well balanced Current exercise: no regular exercise   Wt Readings from Last 3 Encounters:  11/16/17 216 lb (98 kg)  09/23/17 217 lb 8 oz (98.7 kg)  08/17/17 217 lb (98.4 kg)     ----------------------------------------------------------------   Hypertension, follow-up:  BP Readings from Last 3 Encounters:  11/16/17 110/64  09/23/17 122/62  08/17/17 132/72    He was last seen for hypertension 3 months ago.  BP at that visit was 132/72. Management since that visit includes; no changes.He reports good compliance with treatment. He is not having side effects. none He is not exercising. He is adherent to low salt diet.   Outside blood pressures are 133/78. He is experiencing none.  Patient denies none.   Cardiovascular risk factors include diabetes mellitus.  Use of agents associated with hypertension: none.   ----------------------------------------------------------------  Coronary artery disease involving native coronary artery of native heart without angina pectoris From  08/17/2017-no changes. Denies chest pain, but has noticed he gets more short of breath with exertion than he used to.he still rides bikes, but has more trouble getting up hills that he used to have no trouble with.     Allergies  Allergen Reactions  . No Known Allergies      Current Outpatient Medications:  .  ACCU-CHEK AVIVA PLUS test strip, CHECK BLOOD SUGAR ONCE  DAILY, Disp: 100 each, Rfl: 5 .  allopurinol (ZYLOPRIM) 100 MG tablet, TAKE 1 TABLET DAILY, Disp: 90 tablet, Rfl: 4 .  aspirin EC 81 MG tablet, Take 1 tablet (81 mg total) by mouth daily., Disp: 90 tablet, Rfl: 3 .  atorvastatin (LIPITOR) 40 MG tablet, TAKE 1 TABLET BY MOUTH ONCE DAILY, Disp: 30 tablet, Rfl: 11 .  Blood Glucose Monitoring Suppl (ACCU-CHEK AVIVA PLUS) w/Device KIT, USE TO CHECK BLOOD SUGAR  DAILY AS DIRECTED, Disp: 1 kit, Rfl: 0 .  gabapentin (NEURONTIN) 300 MG capsule, TAKE ONE CAPSULE BY MOUTH AT BEDTIME FOR NEUROPATHY, Disp: 90 capsule, Rfl: 4 .  glipiZIDE (GLUCOTROL XL) 10 MG 24 hr tablet, TAKE 1 TABLET DAILY WITH BREAKFAST (Patient taking differently: Take 1/2 tablet daily), Disp: 90 tablet, Rfl: 4 .  JARDIANCE 25 MG TABS tablet, TAKE 1 TABLET DAILY (Patient taking differently: 1/2 tablet daily), Disp: 90 tablet, Rfl: 2 .  ketoconazole (NIZORAL) 2 % shampoo, USE ON SCALP TWICE WEEKLY, Disp: 120 mL, Rfl: 5 .  metFORMIN (GLUCOPHAGE) 1000 MG tablet, TAKE 1 TABLET TWICE A DAY WITH A MEAL, Disp: 180 tablet, Rfl: 4 .  nitroGLYCERIN (NITROSTAT) 0.4 MG  SL tablet, as needed., Disp: , Rfl:  .  pioglitazone (ACTOS) 30 MG tablet, Take 1 tablet (30 mg total) daily by mouth., Disp: 90 tablet, Rfl: 2 .  vitamin B-12 (CYANOCOBALAMIN) 100 MCG tablet, Take by mouth daily., Disp: , Rfl:   Review of Systems  Constitutional: Negative for appetite change, chills and fever.  Respiratory: Negative for chest tightness, shortness of breath and wheezing.   Cardiovascular: Negative for chest pain and palpitations.  Gastrointestinal:  Negative for abdominal pain, nausea and vomiting.    Social History   Tobacco Use  . Smoking status: Former Smoker    Packs/day: 1.00    Years: 5.00    Pack years: 5.00    Types: Cigarettes    Last attempt to quit: 08/06/1972    Years since quitting: 45.3  . Smokeless tobacco: Never Used  Substance Use Topics  . Alcohol use: No    Alcohol/week: 0.0 oz    Comment: 09/26/2016 "quit 01/30/12"   Objective:   BP 110/64 (BP Location: Right Arm, Patient Position: Sitting, Cuff Size: Large)   Pulse 72   Temp 97.9 F (36.6 C) (Oral)   Resp 16   Ht '6\' 3"'  (1.905 m)   Wt 216 lb (98 kg)   SpO2 96%   BMI 27.00 kg/m  Vitals:   11/16/17 0818  BP: 110/64  Pulse: 72  Resp: 16  Temp: 97.9 F (36.6 C)  TempSrc: Oral  SpO2: 96%  Weight: 216 lb (98 kg)  Height: '6\' 3"'  (1.905 m)     Physical Exam   General Appearance:    Alert, cooperative, no distress  Eyes:    PERRL, conjunctiva/corneas clear, EOM's intact       Lungs:     Clear to auscultation bilaterally, respirations unlabored  Heart:    Regular rate and rhythm  Neurologic:   Awake, alert, oriented x 3. No apparent focal neurological           defect.        Results for orders placed or performed in visit on 11/16/17  POCT glycosylated hemoglobin (Hb A1C)  Result Value Ref Range   Hemoglobin A1C 6.6    Est. average glucose Bld gHb Est-mCnc 143        Assessment & Plan:     1. Type II diabetes mellitus with renal manifestations, uncontrolled (HCC) Well controlled.  Continue current medications.  He usually takes t/2 tablet glipizide daily and advised he doesn't need to take a whole extra tablet for blood sugars in the 130s like he has been doing. He could take and extra 1/2 tablet occasionally if his sugar is over 150.  - POCT glycosylated hemoglobin (Hb A1C)  2. Dyspnea on exertion Possibly from deconditioning, he does have history of anemia. Oximetry is normal, heart and lung exam are normal.  - CBC - Brain  natriuretic peptide  3. Essential (primary) hypertension Well controlled.  Continue current medications.    4. Coronary artery disease involving native coronary artery of native heart without angina pectoris. . Compliant with medication.  Continue aggressive risk factor modification. Continue regular follow up Dr. Saunders Revel        [name], MD  [place]

## 2017-11-17 LAB — CBC
Hematocrit: 42.3 % (ref 37.5–51.0)
Hemoglobin: 13.9 g/dL (ref 13.0–17.7)
MCH: 30.1 pg (ref 26.6–33.0)
MCHC: 32.9 g/dL (ref 31.5–35.7)
MCV: 92 fL (ref 79–97)
PLATELETS: 183 10*3/uL (ref 150–379)
RBC: 4.62 x10E6/uL (ref 4.14–5.80)
RDW: 13.9 % (ref 12.3–15.4)
WBC: 4.6 10*3/uL (ref 3.4–10.8)

## 2017-11-17 LAB — BRAIN NATRIURETIC PEPTIDE: BNP: 60 pg/mL (ref 0.0–100.0)

## 2018-01-25 ENCOUNTER — Other Ambulatory Visit: Payer: Self-pay | Admitting: Family Medicine

## 2018-03-18 NOTE — Progress Notes (Signed)
Patient: Stuart Henry Male    DOB: 1941-06-10   77 y.o.   MRN: [id number] Visit Date: 03/19/2018  Today's Provider: [name], MD   Chief Complaint  Patient presents with  . Diabetes  . Hypertension  . Coronary Artery Disease   Subjective:    HPI  Diabetes Mellitus Type II, Follow-up:   Lab Results  Component Value Date   HGBA1C 7.1 (A) 03/19/2018   HGBA1C 6.6 11/16/2017   HGBA1C 6.4 (H) 08/17/2017    Last seen for diabetes 4 months ago.  Management since then includes advising patient to take an extra 1/2 tablet of Glipizide occasionally if his sugar is over 150. Patient reports that he is now taking 1 full tablet of Glipizide since his sugar was running high the last few months.  He reports good compliance with treatment. He is not having side effects.  Current symptoms include none and have been stable. Home blood sugar records: fasting range: 150s  Episodes of hypoglycemia? no   Current Insulin Regimen: none Most Recent Eye Exam: 02/24/2018.  Weight trend: stable Prior visit with dietician: no Current diet: well balanced Current exercise: no regular exercise  Pertinent Labs:    Component Value Date/Time   CHOL 91 (L) 08/17/2017 1051   TRIG 89 08/17/2017 1051   HDL 36 (L) 08/17/2017 1051   LDLCALC 37 08/17/2017 1051   CREATININE 1.11 08/17/2017 1051    Wt Readings from Last 3 Encounters:  03/19/18 220 lb (99.8 kg)  11/16/17 216 lb (98 kg)  09/23/17 217 lb 8 oz (98.7 kg)     Hypertension, follow-up:  BP Readings from Last 3 Encounters:  03/19/18 120/60  11/16/17 110/64  09/23/17 122/62    He was last seen for hypertension 4 months ago.  BP at that visit was 110/64. Management since that visit includes no changes. He reports good compliance with treatment. He is not having side effects.  He is not exercising. He is adherent to low salt diet.   Outside blood pressures are checked occasionally. He is experiencing none.  Patient  denies exertional chest pressure/discomfort, lower extremity edema and palpitations.   Cardiovascular risk factors include advanced age (older than 31 for men, 65 for women), diabetes mellitus, hypertension and male gender.  Use of agents associated with hypertension: NSAIDS.    Follow up of CAD: Patient was last seen for this problem 4 months ago and no changes were made. Patient was to continue regular follow up with Dr. Saunders Revel which is scheduled later this month. Patient reports good compliance with treatment. He denies any chest pains or shortness of breath.   Also states he had some spasms in the right lower back over night after pulling muscle in back playing golf yesterday. Took some ibuprofen today and feels better.     Allergies  Allergen Reactions  . No Known Allergies      Current Outpatient Medications:  .  ACCU-CHEK AVIVA PLUS test strip, CHECK BLOOD SUGAR ONCE  DAILY, Disp: 100 each, Rfl: 5 .  allopurinol (ZYLOPRIM) 100 MG tablet, TAKE 1 TABLET DAILY, Disp: 90 tablet, Rfl: 4 .  aspirin EC 81 MG tablet, Take 1 tablet (81 mg total) by mouth daily., Disp: 90 tablet, Rfl: 3 .  atorvastatin (LIPITOR) 40 MG tablet, TAKE 1 TABLET BY MOUTH ONCE DAILY, Disp: 30 tablet, Rfl: 11 .  Blood Glucose Monitoring Suppl (ACCU-CHEK AVIVA PLUS) w/Device KIT, USE TO CHECK BLOOD SUGAR  DAILY AS  DIRECTED, Disp: 1 kit, Rfl: 0 .  empagliflozin (JARDIANCE) 10 MG TABS tablet, Take 10 mg by mouth daily., Disp: 30 tablet, Rfl: 12 .  gabapentin (NEURONTIN) 300 MG capsule, TAKE ONE CAPSULE BY MOUTH AT BEDTIME FOR NEUROPATHY, Disp: 90 capsule, Rfl: 4 .  glipiZIDE (GLUCOTROL XL) 10 MG 24 hr tablet, TAKE 1 TABLET DAILY WITH BREAKFAST (Patient taking differently: Take 1/2 tablet daily), Disp: 90 tablet, Rfl: 4 .  ketoconazole (NIZORAL) 2 % shampoo, USE ON SCALP TWICE WEEKLY, Disp: 120 mL, Rfl: 5 .  metFORMIN (GLUCOPHAGE) 1000 MG tablet, TAKE 1 TABLET TWICE A DAY WITH A MEAL, Disp: 180 tablet, Rfl: 4 .   nitroGLYCERIN (NITROSTAT) 0.4 MG SL tablet, as needed., Disp: , Rfl:  .  pioglitazone (ACTOS) 30 MG tablet, Take 1 tablet (30 mg total) daily by mouth., Disp: 90 tablet, Rfl: 2 .  vitamin B-12 (CYANOCOBALAMIN) 100 MCG tablet, Take by mouth daily., Disp: , Rfl:   Review of Systems  Constitutional: Negative for appetite change, chills and fever.  Respiratory: Negative for chest tightness, shortness of breath and wheezing.   Cardiovascular: Negative for chest pain and palpitations.  Gastrointestinal: Negative for abdominal pain, nausea and vomiting.    Social History   Tobacco Use  . Smoking status: Former Smoker    Packs/day: 1.00    Years: 5.00    Pack years: 5.00    Types: Cigarettes    Last attempt to quit: 08/06/1972    Years since quitting: 45.6  . Smokeless tobacco: Never Used  Substance Use Topics  . Alcohol use: No    Alcohol/week: 0.0 standard drinks    Comment: 09/26/2016 "quit 01/30/12"   Objective:   BP 120/60 (BP Location: Right Arm, Patient Position: Sitting, Cuff Size: Normal)   Pulse 60   Temp 97.9 F (36.6 C)   Resp 16   Ht '6\' 3"'  (1.905 m)   Wt 220 lb (99.8 kg)   BMI 27.50 kg/m  Vitals:   03/19/18 0820  BP: 120/60  Pulse: 60  Resp: 16  Temp: 97.9 F (36.6 C)  Weight: 220 lb (99.8 kg)  Height: '6\' 3"'  (1.905 m)     Physical Exam  General appearance: alert, well developed, well nourished, cooperative and in no distress Head: Normocephalic, without obvious abnormality, atraumatic Respiratory: Respirations even and unlabored, normal respiratory rate Extremities: No gross deformities Skin: Skin color, texture, turgor normal. No rashes seen  Psych: Appropriate mood and affect. MS: Tightness of right para-thoracic muscles.   Results for orders placed or performed in visit on 03/19/18  POCT glycosylated hemoglobin (Hb A1C)  Result Value Ref Range   Hemoglobin A1C 7.1 (A) 4.0 - 5.6 %   HbA1c POC (<> result, manual entry)     HbA1c, POC (prediabetic  range)     HbA1c, POC (controlled diabetic range)     Est. average glucose Bld gHb Est-mCnc 157        Assessment & Plan:     1. Type II diabetes mellitus with renal manifestations, uncontrolled (HCC) A1c is up a bit due to slipping on diet over the summer. He states he is back on usual diet and sugars have been staying under 120 the last several days. Continue current medications.  Advised we could increase Jardiance if a1c continues to rise.  - POCT glycosylated hemoglobin (Hb A1C)  2. Essential (primary) hypertension Well controlled.  Continue current medications.    3. Back strain, initial encounter Mild, recommend routine ice application over  the next day or two.   Follow up and CPE in January 2020.       [name], MD  [facility]

## 2018-03-19 ENCOUNTER — Ambulatory Visit: Payer: Medicare Other | Admitting: Family Medicine

## 2018-03-19 ENCOUNTER — Encounter: Payer: Self-pay | Admitting: Family Medicine

## 2018-03-19 VITALS — BP 120/60 | HR 60 | Temp 97.9°F | Resp 16 | Ht 75.0 in | Wt 220.0 lb

## 2018-03-19 DIAGNOSIS — IMO0002 Reserved for concepts with insufficient information to code with codable children: Secondary | ICD-10-CM

## 2018-03-19 DIAGNOSIS — S39012A Strain of muscle, fascia and tendon of lower back, initial encounter: Secondary | ICD-10-CM | POA: Diagnosis not present

## 2018-03-19 DIAGNOSIS — E1165 Type 2 diabetes mellitus with hyperglycemia: Secondary | ICD-10-CM | POA: Diagnosis not present

## 2018-03-19 DIAGNOSIS — I10 Essential (primary) hypertension: Secondary | ICD-10-CM

## 2018-03-19 DIAGNOSIS — E1129 Type 2 diabetes mellitus with other diabetic kidney complication: Secondary | ICD-10-CM | POA: Diagnosis not present

## 2018-03-19 LAB — POCT GLYCOSYLATED HEMOGLOBIN (HGB A1C)
ESTIMATED AVERAGE GLUCOSE: 157
HEMOGLOBIN A1C: 7.1 % — AB (ref 4.0–5.6)

## 2018-03-19 NOTE — Patient Instructions (Addendum)
   The CDC recommends two doses of Shingrix (the shingles vaccine) separated by 2 to 6 months for adults age 75 years and older. I recommend checking with your insurance plan regarding coverage for this vaccine.   Marland Kitchen Apply ice for about 8 minutes every 6 hours for the next 2-3 days.

## 2018-03-30 IMAGING — CR DG CHEST 1V PORT
1 series · 1 of 1 positions shown · non-contrast
Comparison: 09/10/2016

CLINICAL DATA: Status post CABG.

EXAM:
PORTABLE CHEST 1 VIEW

[AP]
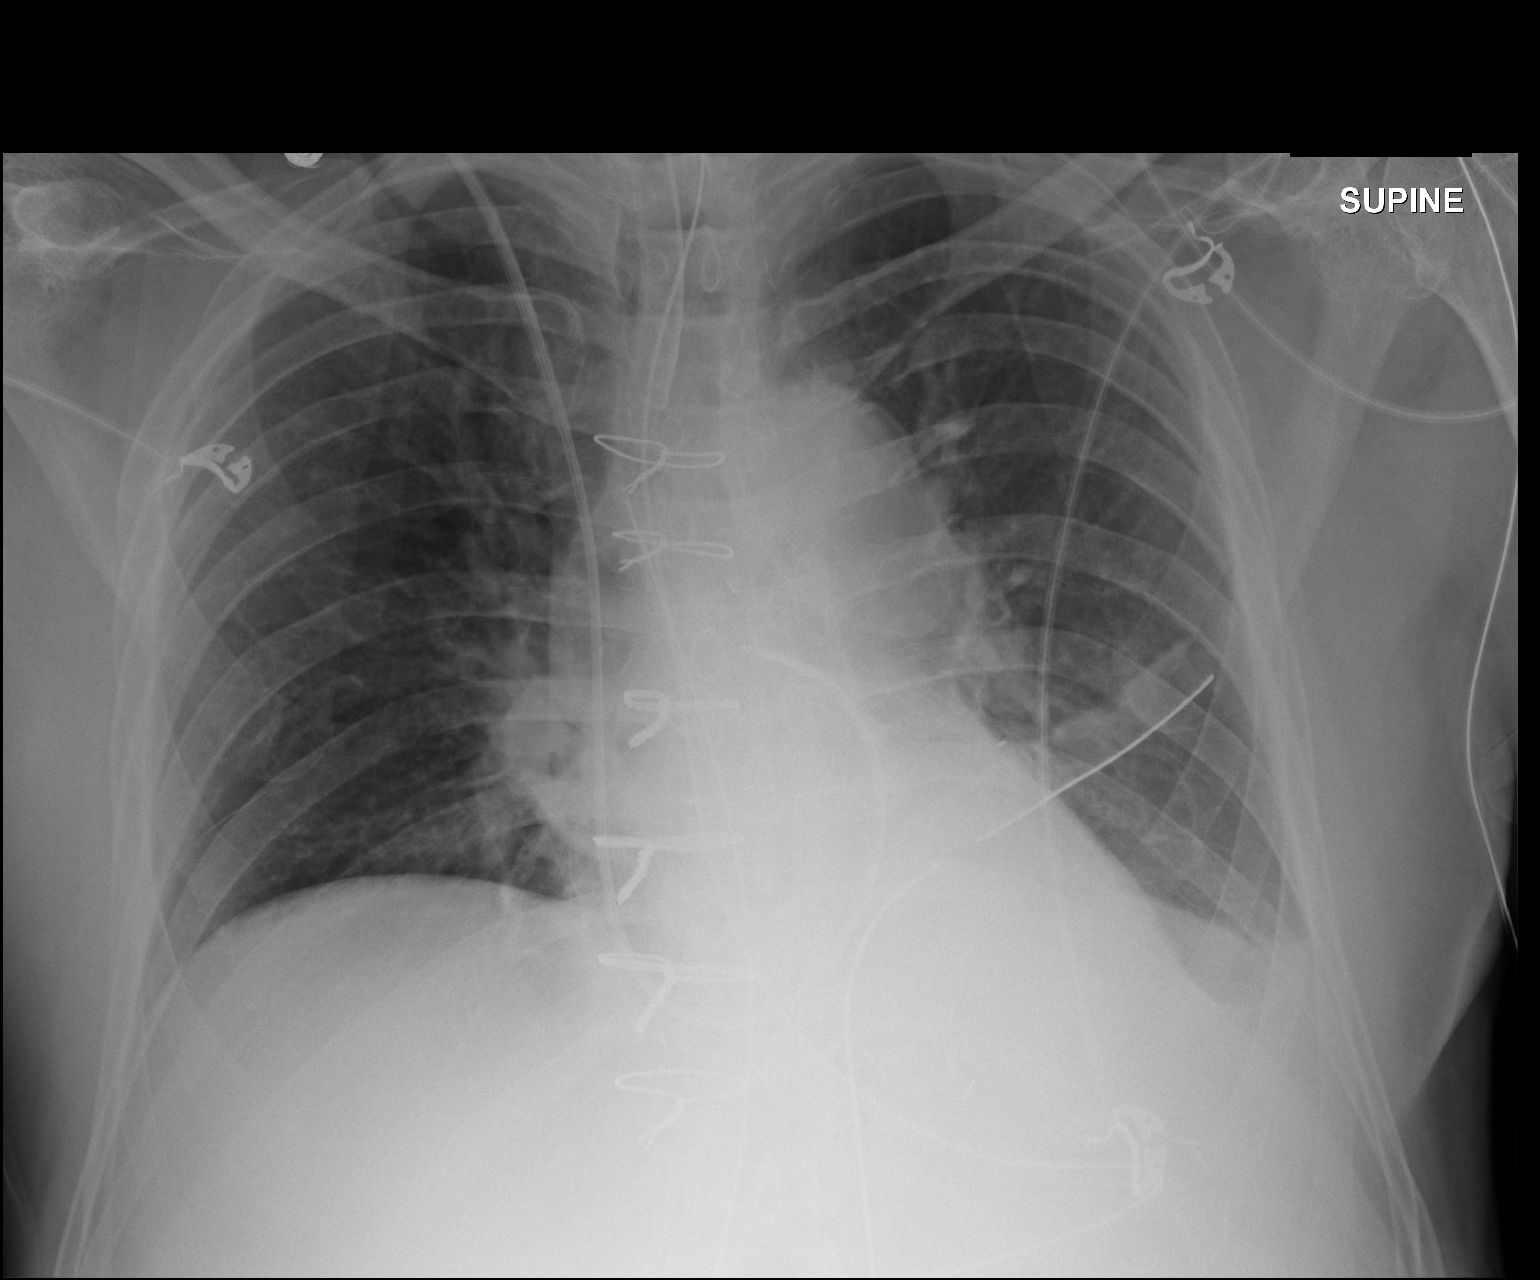

[1 of 1 positions shown; findings below may reference images not displayed]

FINDINGS: 7777 hours. Endotracheal tube tip is 4.0 cm above the base of the
carina. The NG tube passes into the stomach although the distal tip
position is not included on the film. Pulmonary artery catheter tip
is in the right main pulmonary artery. Left chest tube noted without
evidence for left pneumothorax. Midline mediastinal/ pericardial
drain. No pulmonary edema or substantial pleural effusion.
Retrocardiac atelectasis evident.
IMPRESSION: Low volume film with retrocardiac atelectasis.

## 2018-03-30 NOTE — Progress Notes (Signed)
Follow-up Outpatient Visit Date: 03/31/2018  Primary Care Provider: Birdie Sons, MD [address] [place] [address]  Chief Complaint: Follow-up coronary artery disease  HPI:  Stuart Henry is a 77 y.o. year-old male with history of coronary artery disease status post CABG (08/5943) complicated by postoperative atrial fibrillation, ischemic cardiomyopathy(LVEF normal by echo in 04/2017), type 2 diabetes mellitus, and hypertension, who presents for follow-up of coronary artery disease.  I last saw Stuart Henry in February, at which time he reported improving energy.  Today, Stuart Henry reports feeling relatively well, though he has not been exercising as much as at our prior visit.  In particular, he attempted to ride his bike after a several week hiatus and developed significant shortness of breath when riding uphill.  He was concerned because of similar symptoms leading up to his CABG last year.  He has continued to do other exercises at home without any difficulty.  He denies anginal chest pain, palpitations, lightheadedness, orthopnea, PND, and edema.  He is compliant with his medications.  Stuart Henry only other complaint is of continued soreness along his median sternotomy.  At times, even just clothing brushing up against it seems to irritate it.  He tried using a wound softening lotion, but feels like this actually worsened the discomfort.  It is slowly improving.  --------------------------------------------------------------------------------------------------  Cardiovascular History & Procedures: Cardiovascular Problems:  Coronary artery disease status post CABG (09/2016)  Ischemic cardiomyopathy  Postoperative atrial fibrillation  Risk Factors:  Known coronary artery disease, diabetes mellitus, hypertension, age greater than 5, and male gender  Cath/PCI:  LHC (09/16/16): Significant two-vessel CAD with 95% proximal LAD stenosis involving ostium of D1, 90%  ostial ramus intermedius stenosis, and at least 50% lesions involving the ostium and mid section of the LCx. RCA with 25% distal stenosis. LVEF 55-60% with normal LVEDP.  CV Surgery:  CABG (09/24/16, Dr. Roxan Hockey): LIMA to LAD, SVG to D1, and sequential SVG to first and second branches of ramus intermedius.  EP Procedures and Devices:  None  Non-Invasive Evaluation(s):  Transthoracic echocardiogram (04/23/17): Normal LV size. LVEF 55-60% with anteroseptal hypokinesis (? related to prior cardiac surgery). Normal diastolic function. Borderline dilated aortic root (3.7 cm). Mild MR. Normal RV size and function. Normal pulmonary artery pressure.  Carotid Doppler (09/23/16): Mild (1-39%) stenoses involving the left and right internal carotid arteries. Vertebral arteries with antegrade flow.  ABIs (09/23/16): Right 1.98 and the left 1.49 suggestive of medial calcification.  Transthoracic echocardiogram (09/19/16): Normal LV size with mildly reduced contraction (EF 45-50%) with basal and mid anterior and anteroseptal hypokinesis. Grade 1 diastolic dysfunction. Mildly dilated left atrium. Normal RV size and function.  Recent CV Pertinent Labs: Lab Results  Component Value Date   CHOL 91 (L) 08/17/2017   HDL 36 (L) 08/17/2017   LDLCALC 37 08/17/2017   TRIG 89 08/17/2017   CHOLHDL 2.5 08/17/2017   INR 1.17 09/29/2016   BNP 60.0 11/16/2017   K 4.6 08/17/2017   MG 1.9 09/25/2016   BUN 19 08/17/2017   CREATININE 1.11 08/17/2017    Past medical and surgical history were reviewed and updated in EPIC.  Current Meds  Medication Sig  . ACCU-CHEK AVIVA PLUS test strip CHECK BLOOD SUGAR ONCE  DAILY  . allopurinol (ZYLOPRIM) 100 MG tablet TAKE 1 TABLET DAILY  . aspirin EC 81 MG tablet Take 1 tablet (81 mg total) by mouth daily.  Marland Kitchen atorvastatin (LIPITOR) 40 MG tablet TAKE 1 TABLET BY MOUTH  ONCE DAILY  . Blood Glucose Monitoring Suppl (ACCU-CHEK AVIVA PLUS) w/Device KIT USE TO CHECK BLOOD SUGAR   DAILY AS DIRECTED  . empagliflozin (JARDIANCE) 10 MG TABS tablet Take 10 mg by mouth daily.  Marland Kitchen gabapentin (NEURONTIN) 300 MG capsule TAKE ONE CAPSULE BY MOUTH AT BEDTIME FOR NEUROPATHY  . glipiZIDE (GLUCOTROL) 10 MG tablet Take 10 mg by mouth daily before breakfast.  . ketoconazole (NIZORAL) 2 % shampoo USE ON SCALP TWICE WEEKLY  . metFORMIN (GLUCOPHAGE) 1000 MG tablet TAKE 1 TABLET TWICE A DAY WITH A MEAL  . nitroGLYCERIN (NITROSTAT) 0.4 MG SL tablet as needed.  . pioglitazone (ACTOS) 30 MG tablet Take 1 tablet (30 mg total) daily by mouth.  . vitamin B-12 (CYANOCOBALAMIN) 100 MCG tablet Take by mouth daily.    Allergies: No known allergies  Social History   Tobacco Use  . Smoking status: Former Smoker    Packs/day: 1.00    Years: 5.00    Pack years: 5.00    Types: Cigarettes    Last attempt to quit: 08/06/1972    Years since quitting: 45.6  . Smokeless tobacco: Never Used  Substance Use Topics  . Alcohol use: No    Alcohol/week: 0.0 standard drinks    Comment: 09/26/2016 "quit 01/30/12"  . Drug use: No    Family History  Problem Relation Age of Onset  . Dementia Mother   . Diabetes Mother   . Cancer Father   . Diabetes Sister   . Diabetes Brother   . Heart disease Other     Review of Systems: A 12-system review of systems was performed and was negative except as noted in the HPI.  --------------------------------------------------------------------------------------------------  Physical Exam: BP 122/68 (BP Location: Left Arm, Patient Position: Sitting, Cuff Size: Normal)   Pulse 71   Ht '6\' 4"'  (1.93 m)   Wt 221 lb (100.2 kg)   BMI 26.90 kg/m   General: NAD. HEENT: No conjunctival pallor or scleral icterus. Moist mucous membranes.  OP clear. Neck: Supple without lymphadenopathy, thyromegaly, JVD, or HJR. Lungs: Normal work of breathing. Clear to auscultation bilaterally without wheezes or crackles. Heart: Regular rate and rhythm without murmurs, rubs, or  gallops. Non-displaced PMI. Abd: Bowel sounds present. Soft, NT/ND without hepatosplenomegaly Ext: No lower extremity edema. Radial, PT, and DP pulses are 2+ bilaterally. Skin: Warm and dry without rash.  Median sternotomy incision well-healed without evidence of wound breakdown.  EKG: Normal sinus rhythm with inferior Q waves.  Otherwise, no significant abnormalities.  No change from prior tracing on 09/23/2017.  Lab Results  Component Value Date   WBC 4.6 11/16/2017   HGB 13.9 11/16/2017   HCT 42.3 11/16/2017   MCV 92 11/16/2017   PLT 183 11/16/2017    Lab Results  Component Value Date   NA 141 08/17/2017   K 4.6 08/17/2017   CL 102 08/17/2017   CO2 23 08/17/2017   BUN 19 08/17/2017   CREATININE 1.11 08/17/2017   GLUCOSE 115 (H) 08/17/2017   ALT 21 08/17/2017    Lab Results  Component Value Date   CHOL 91 (L) 08/17/2017   HDL 36 (L) 08/17/2017   LDLCALC 37 08/17/2017   TRIG 89 08/17/2017   CHOLHDL 2.5 08/17/2017    --------------------------------------------------------------------------------------------------  ASSESSMENT AND PLAN: Coronary artery disease and shortness of breath (anginal equivalent) Patient denies any angina but reports an episode of shortness of breath when riding his bike after a several week hiatus.  This is concerning because exertional  dyspnea was his anginal equivalent leading up to finding multivessel CAD and subsequent CABG last year.  He is able to do less vigorous exercise as well as all of his activities of daily living without any symptoms or limitations, consistent with CCS class II symptoms.  We have agreed to obtain an exercise myocardial perfusion stress test for further evaluation.  We will continue his current medications for secondary prevention.  If stress test is low risk, we could consider adding a long-acting nitrate or ranolazine as an empiric trial for antianginal relief.  Ischemic cardiomyopathy Stuart Henry appears euvolemic and  well compensated on exam.  I do not think that his exertional dyspnea once likely represents decompensated heart failure.  We will continue his current medications.  Hyperlipidemia Continue high intensity statin therapy with atorvastatin 40 mg daily.  Most recent LDL in January was excellent at 37.  Follow-up: Return to clinic in 3 to 4 months, sooner if significant abnormalities noted on stress test or symptoms worsen.  Nelva Bush, MD 03/31/2018 10:18 AM

## 2018-03-31 ENCOUNTER — Encounter: Payer: Self-pay | Admitting: Internal Medicine

## 2018-03-31 ENCOUNTER — Ambulatory Visit (INDEPENDENT_AMBULATORY_CARE_PROVIDER_SITE_OTHER): Payer: Medicare Other | Admitting: Internal Medicine

## 2018-03-31 VITALS — BP 122/68 | HR 71 | Ht 76.0 in | Wt 221.0 lb

## 2018-03-31 DIAGNOSIS — I255 Ischemic cardiomyopathy: Secondary | ICD-10-CM

## 2018-03-31 DIAGNOSIS — I25119 Atherosclerotic heart disease of native coronary artery with unspecified angina pectoris: Secondary | ICD-10-CM

## 2018-03-31 DIAGNOSIS — R0602 Shortness of breath: Secondary | ICD-10-CM

## 2018-03-31 DIAGNOSIS — E785 Hyperlipidemia, unspecified: Secondary | ICD-10-CM | POA: Diagnosis not present

## 2018-03-31 IMAGING — CR DG CHEST 1V PORT
1 series · 1 of 1 positions shown · non-contrast
Comparison: 09/24/2016

CLINICAL DATA: Chest soreness post CABG.

EXAM:
PORTABLE CHEST 1 VIEW

[AP]
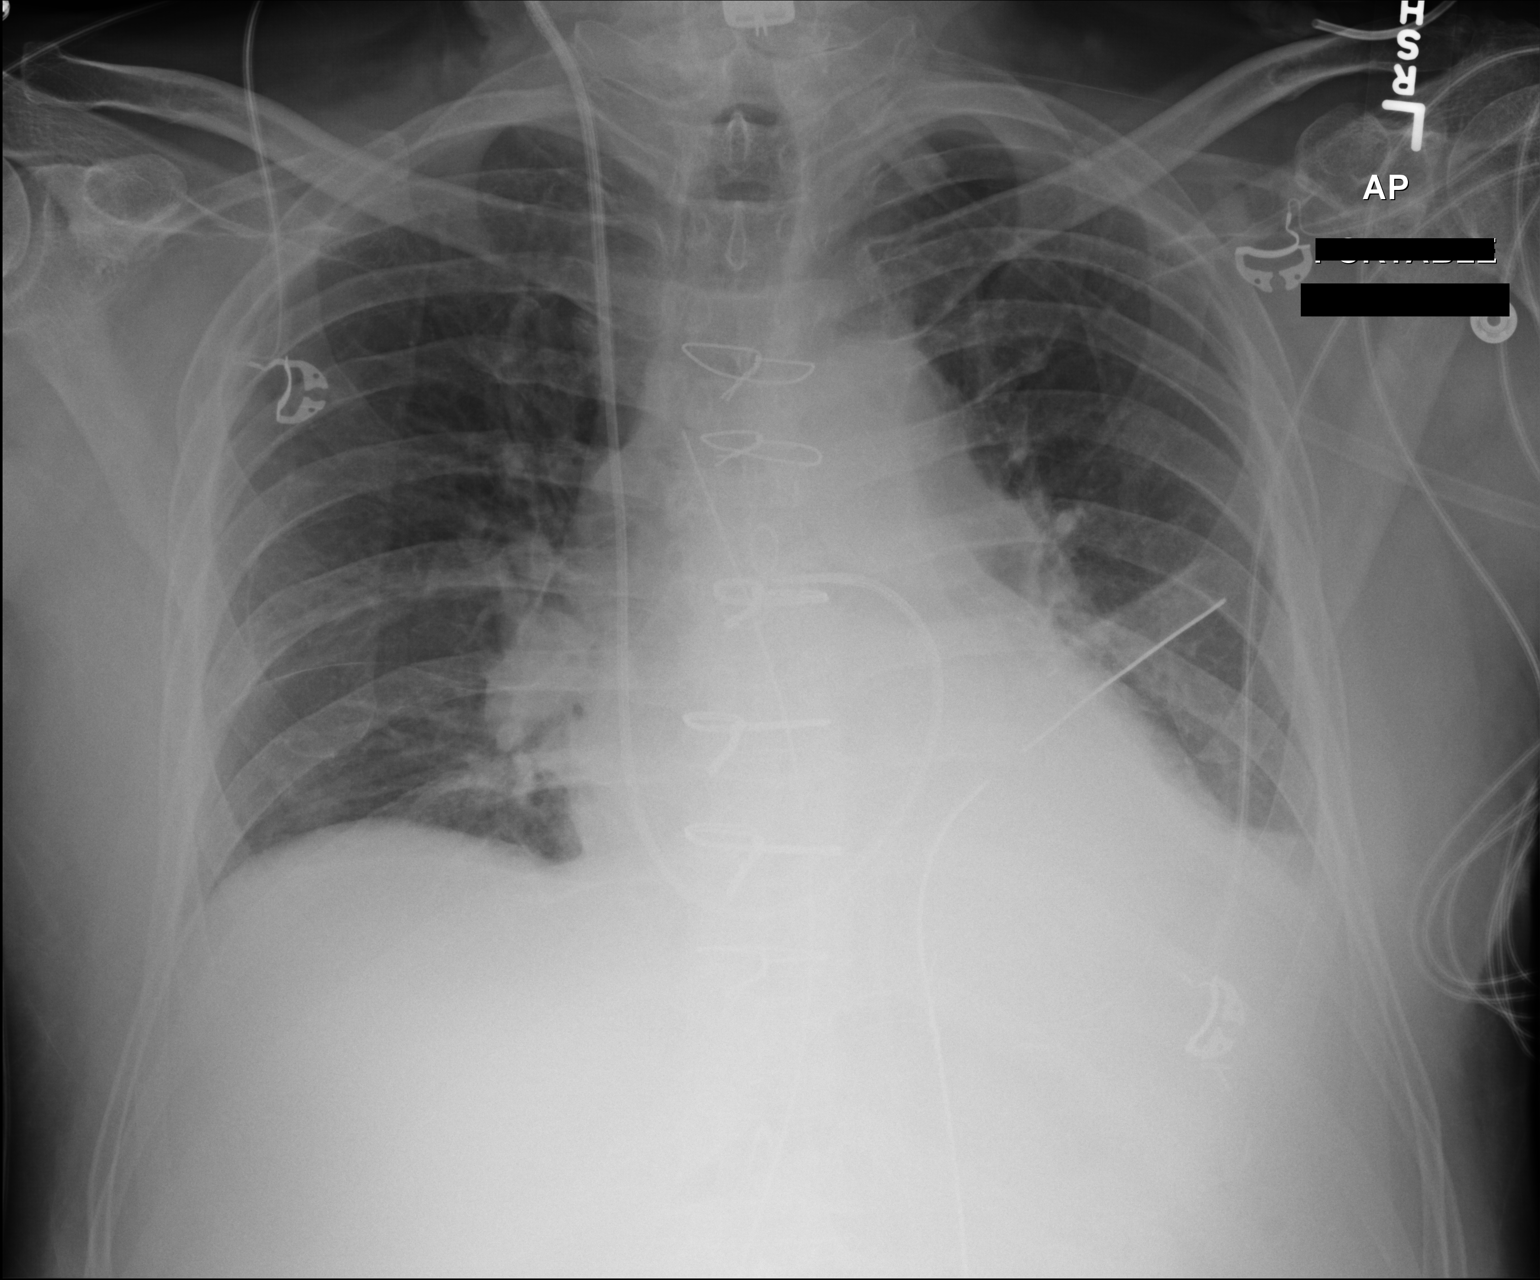

[1 of 1 positions shown; findings below may reference images not displayed]

FINDINGS: Patient has been extubated. Enteric catheters removed. Mediastinal
and chest drains remain. Swan-Ganz catheter in stable position.

Cardiomediastinal silhouette is stable.

There is no evidence of focal airspace consolidation, pleural
effusion or pneumothorax. Low lung volumes with left greater than
right basilar atelectasis.

Osseous structures are without acute abnormality. Soft tissues are
grossly normal.
IMPRESSION: Post extubation.

Low lung volumes with left greater than right basilar atelectasis.

## 2018-03-31 MED ORDER — NITROGLYCERIN 0.4 MG SL SUBL: 0.4000 mg | SUBLINGUAL_TABLET | SUBLINGUAL | 5 refills | Status: DC | PRN

## 2018-03-31 NOTE — Patient Instructions (Addendum)
Medication Instructions:  Your physician recommends that you continue on your current medications as directed. Please refer to the Current Medication list given to you today.   Labwork: none  Testing/Procedures: Your physician has requested that you have en exercise stress myoview. For further information please visit [email or web address]. Please follow instruction sheet, as given.  [place]  Your caregiver has ordered a Stress Test with nuclear imaging. The purpose of this test is to evaluate the blood supply to your heart muscle. This procedure is referred to as a "Non-Invasive Stress Test." This is because other than having an IV started in your vein, nothing is inserted or "invades" your body. Cardiac stress tests are done to find areas of poor blood flow to the heart by determining the extent of coronary artery disease (CAD). Some patients exercise on a treadmill, which naturally increases the blood flow to your heart, while others who are  unable to walk on a treadmill due to physical limitations have a pharmacologic/chemical stress agent called Lexiscan . This medicine will mimic walking on a treadmill by temporarily increasing your coronary blood flow.   Please note: these test may take anywhere between 2-4 hours to complete  PLEASE REPORT TO [facility] AT THE FIRST DESK WILL DIRECT YOU WHERE TO GO  Date of Procedure:_____________________________________  Arrival Time for Procedure:______________________________  Instructions regarding medication:   __X__ : Hold diabetes medication (METFORMIN, ACTOS, GLIPIZINE, JARDIANCE) morning of procedure   PLEASE NOTIFY THE OFFICE AT LEAST 24 HOURS IN ADVANCE IF YOU ARE UNABLE TO KEEP YOUR APPOINTMENT.  [phone] AND  PLEASE NOTIFY NUCLEAR MEDICINE AT [facility] AT LEAST 24 HOURS IN ADVANCE IF YOU ARE UNABLE TO KEEP YOUR APPOINTMENT. [phone]  How to prepare for your Myoview test:  1. Do not eat or  drink after midnight 2. No caffeine for 24 hours prior to test 3. No smoking 24 hours prior to test. 4. Your medication may be taken with water.  If your doctor stopped a medication because of this test, do not take that medication. 5. Pants are appropriate. Please wear a short sleeve shirt. 6. No perfume, cologne or lotion. 7. Wear comfortable walking shoes.   Follow-Up: Your physician recommends that you schedule a follow-up appointment in: 3-4 MONTHS WITH DR END.   If you need a refill on your cardiac medications before your next appointment, please call your pharmacy.    Cardiac Nuclear Scan A cardiac nuclear scan is a test that measures blood flow to the heart when a person is resting and when he or she is exercising. The test looks for problems such as:  Not enough blood reaching a portion of the heart.  The heart muscle not working normally.  You may need this test if:  You have heart disease.  You have had abnormal lab results.  You have had heart surgery or angioplasty.  You have chest pain.  You have shortness of breath.  In this test, a radioactive dye (tracer) is injected into your bloodstream. After the tracer has traveled to your heart, an imaging device is used to measure how much of the tracer is absorbed by or distributed to various areas of your heart. This procedure is usually done at a hospital and takes 2-4 hours. Tell a health care provider about:  Any allergies you have.  All medicines you are taking, including vitamins, herbs, eye drops, creams, and over-the-counter medicines.  Any problems you or family members have had with  the use of anesthetic medicines.  Any blood disorders you have.  Any surgeries you have had.  Any medical conditions you have.  Whether you are pregnant or may be pregnant. What are the risks? Generally, this is a safe procedure. However, problems may occur, including:  Serious chest pain and heart attack. This is  only a risk if the stress portion of the test is done.  Rapid heartbeat.  Sensation of warmth in your chest. This usually passes quickly.  What happens before the procedure?  Ask your health care provider about changing or stopping your regular medicines. This is especially important if you are taking diabetes medicines or blood thinners.  Remove your jewelry on the day of the procedure. What happens during the procedure?  An IV tube will be inserted into one of your veins.  Your health care provider will inject a small amount of radioactive tracer through the tube.  You will wait for 20-40 minutes while the tracer travels through your bloodstream.  Your heart activity will be monitored with an electrocardiogram (ECG).  You will lie down on an exam table.  Images of your heart will be taken for about 15-20 minutes.  You may be asked to exercise on a treadmill or stationary bike. While you exercise, your heart's activity will be monitored with an ECG, and your blood pressure will be checked. If you are unable to exercise, you may be given a medicine to increase blood flow to parts of your heart.  When blood flow to your heart has peaked, a tracer will again be injected through the IV tube.  After 20-40 minutes, you will get back on the exam table and have more images taken of your heart.  When the procedure is over, your IV tube will be removed. The procedure may vary among health care providers and hospitals. Depending on the type of tracer used, scans may need to be repeated 3-4 hours later. What happens after the procedure?  Unless your health care provider tells you otherwise, you may return to your normal schedule, including diet, activities, and medicines.  Unless your health care provider tells you otherwise, you may increase your fluid intake. This will help flush the contrast dye from your body. Drink enough fluid to keep your urine clear or pale yellow.  It is up to  you to get your test results. Ask your health care provider, or the department that is doing the test, when your results will be ready. Summary  A cardiac nuclear scan measures the blood flow to the heart when a person is resting and when he or she is exercising.  You may need this test if you are at risk for heart disease.  Tell your health care provider if you are pregnant.  Unless your health care provider tells you otherwise, increase your fluid intake. This will help flush the contrast dye from your body. Drink enough fluid to keep your urine clear or pale yellow. This information is not intended to replace advice given to you by your health care provider. Make sure you discuss any questions you have with your health care provider. Document Released: 08/15/2004 Document Revised: 07/23/2016 Document Reviewed: 06/29/2013 Elsevier Interactive Patient Education  2017 [employer].

## 2018-04-01 ENCOUNTER — Encounter: Payer: Self-pay | Admitting: Internal Medicine

## 2018-04-05 ENCOUNTER — Other Ambulatory Visit: Payer: Self-pay | Admitting: Family Medicine

## 2018-04-05 DIAGNOSIS — E118 Type 2 diabetes mellitus with unspecified complications: Secondary | ICD-10-CM

## 2018-04-07 ENCOUNTER — Encounter
Admission: RE | Admit: 2018-04-07 | Discharge: 2018-04-07 | Disposition: A | Discharge status: Home / Self Care | Payer: Medicare Other | Source: Ambulatory Visit | Attending: Internal Medicine | Admitting: Internal Medicine

## 2018-04-07 DIAGNOSIS — R0602 Shortness of breath: Secondary | ICD-10-CM | POA: Diagnosis not present

## 2018-04-07 DIAGNOSIS — I25119 Atherosclerotic heart disease of native coronary artery with unspecified angina pectoris: Secondary | ICD-10-CM | POA: Diagnosis present

## 2018-04-07 LAB — NM MYOCAR MULTI W/SPECT W/WALL MOTION / EF
CHL CUP NUCLEAR SRS: 0
CSEPEDS: 31 s
CSEPPHR: 139 {beats}/min
Estimated workload: 10.1 METS
Exercise duration (min): 8 min
LV sys vol: 38 mL
LVDIAVOL: 97 mL (ref 62–150)
NUC STRESS TID: 0.88
Percent HR: 95 %
Rest HR: 58 {beats}/min
SDS: 0
SSS: 0

## 2018-04-07 MED ORDER — TECHNETIUM TC 99M TETROFOSMIN IV KIT
12.9850 | PACK | Freq: Once | INTRAVENOUS | Status: AC | PRN
  Administered 2018-04-07: 12.985 via INTRAVENOUS

## 2018-04-07 MED ORDER — TECHNETIUM TC 99M TETROFOSMIN IV KIT
32.3400 | PACK | Freq: Once | INTRAVENOUS | Status: AC | PRN
  Administered 2018-04-07: 32.34 via INTRAVENOUS

## 2018-04-14 ENCOUNTER — Encounter: Payer: Self-pay | Admitting: Internal Medicine

## 2018-04-14 ENCOUNTER — Ambulatory Visit (INDEPENDENT_AMBULATORY_CARE_PROVIDER_SITE_OTHER): Payer: Medicare Other | Admitting: Internal Medicine

## 2018-04-14 VITALS — BP 118/78 | HR 72 | Ht 76.0 in | Wt 217.2 lb

## 2018-04-14 DIAGNOSIS — I2510 Atherosclerotic heart disease of native coronary artery without angina pectoris: Secondary | ICD-10-CM | POA: Diagnosis not present

## 2018-04-14 NOTE — Patient Instructions (Signed)
Medication Instructions:  Your physician recommends that you continue on your current medications as directed. Please refer to the Current Medication list given to you today.   Labwork: none  Testing/Procedures: none  Follow-Up: Your physician recommends that you schedule a follow-up appointment as scheduled in November.  If you need a refill on your cardiac medications before your next appointment, please call your pharmacy.

## 2018-04-14 NOTE — Progress Notes (Signed)
Follow-up Outpatient Visit Date: 04/14/2018  Primary Care Provider: Birdie Sons, MD [address] [place] [address]  Chief Complaint: Follow-up stress test  HPI:  Stuart Henry is a 77 y.o. year-old male with history of coronary artery disease status post CABG (03/7680) complicated by postoperative atrial fibrillation, ischemic cardiomyopathy(LVEF normal by echo in 04/2017), type 2 diabetes mellitus, and hypertension, who presents for follow-up of coronary artery disease and dyspnea on exertion.  I last saw Stuart Henry about 2 weeks ago, at which time he was doing well except for exertional dyspnea when riding his bike uphill.  He did not have any symptoms with other exercise or activities of daily living.  We agreed to perform a myocardial perfusion stress test, which was interpreted as moderate risk with possible area of mild ischemia involving the apical anterior and apex as well as a small fixed inferoapical defect.  LVEF was 59%.  Greater than 1 mm of ST depression was noted during exercise though overall exercise capacity was reasonably good at 10.1 METS.  Today, Stuart Henry reports that he has been feeling well.  He has been doing a less strenuous bike ride, avoiding "[place]" and has not had any significant difficulty.  He is gradually increasing the duration of his ride.  He has not had chest pain nor significant shortness of breath.  He also denies palpitations, lightheadedness, and edema.  --------------------------------------------------------------------------------------------------  Cardiovascular History & Procedures: Cardiovascular Problems:  Coronary artery disease status post CABG (09/2016)  Ischemic cardiomyopathy  Postoperative atrial fibrillation  Risk Factors:  Known coronary artery disease, diabetes mellitus, hypertension, age greater than 31, and male gender  Cath/PCI:  LHC (09/16/16): Significant two-vessel CAD with 95% proximal LAD  stenosis involving ostium of D1, 90% ostial ramus intermedius stenosis, and at least 50% lesions involving the ostium and mid section of the LCx. RCA with 25% distal stenosis. LVEF 55-60% with normal LVEDP.  CV Surgery:  CABG (09/24/16, Dr. Roxan Hockey): LIMA to LAD, SVG to D1, and sequential SVG to first and second branches of ramus intermedius.  EP Procedures and Devices:  None  Non-Invasive Evaluation(s):  Exercise MPI (04/07/2018): Interpreted as intermediate risk with possible apical anterior and apical ischemia and small inferolateral infarct.  Normal LVEF.  Greater than 1 mm ST depression at stress.  I personally reviewed the images and believe that the apical defect represents artifact, as there is normal perfusion in this region on the non-attenuation corrected images.  Only perfusion abnormality is a small apical inferior/lateral defect that may represent prior infarct, consistent with a low risk study  Transthoracic echocardiogram (04/23/17): Normal LV size. LVEF 55-60% with anteroseptal hypokinesis (? related to prior cardiac surgery). Normal diastolic function. Borderline dilated aortic root (3.7 cm). Mild MR. Normal RV size and function. Normal pulmonary artery pressure.  Carotid Doppler (09/23/16): Mild (1-39%) stenoses involving the left and right internal carotid arteries. Vertebral arteries with antegrade flow.  ABIs (09/23/16): Right 1.98 and the left 1.49 suggestive of medial calcification.  Transthoracic echocardiogram (09/19/16): Normal LV size with mildly reduced contraction (EF 45-50%) with basal and mid anterior and anteroseptal hypokinesis. Grade 1 diastolic dysfunction. Mildly dilated left atrium. Normal RV size and function.  Recent CV Pertinent Labs: Lab Results  Component Value Date   CHOL 91 (L) 08/17/2017   HDL 36 (L) 08/17/2017   LDLCALC 37 08/17/2017   TRIG 89 08/17/2017   CHOLHDL 2.5 08/17/2017   INR 1.17 09/29/2016   BNP 60.0 11/16/2017  K 4.6  08/17/2017   MG 1.9 09/25/2016   BUN 19 08/17/2017   CREATININE 1.11 08/17/2017    Past medical and surgical history were reviewed and updated in EPIC.  Current Meds  Medication Sig  . ACCU-CHEK AVIVA PLUS test strip CHECK BLOOD SUGAR ONCE  DAILY  . allopurinol (ZYLOPRIM) 100 MG tablet TAKE 1 TABLET DAILY  . aspirin EC 81 MG tablet Take 1 tablet (81 mg total) by mouth daily.  Marland Kitchen atorvastatin (LIPITOR) 40 MG tablet TAKE 1 TABLET BY MOUTH ONCE DAILY  . Blood Glucose Monitoring Suppl (ACCU-CHEK AVIVA PLUS) w/Device KIT USE TO CHECK BLOOD SUGAR  DAILY AS DIRECTED  . empagliflozin (JARDIANCE) 10 MG TABS tablet Take 10 mg by mouth daily.  Marland Kitchen gabapentin (NEURONTIN) 300 MG capsule TAKE ONE CAPSULE BY MOUTH AT BEDTIME FOR NEUROPATHY  . glipiZIDE (GLUCOTROL) 10 MG tablet Take 10 mg by mouth daily before breakfast.  . ketoconazole (NIZORAL) 2 % shampoo USE ON SCALP TWICE WEEKLY  . metFORMIN (GLUCOPHAGE) 1000 MG tablet TAKE 1 TABLET TWICE A DAY WITH A MEAL  . nitroGLYCERIN (NITROSTAT) 0.4 MG SL tablet Place 1 tablet (0.4 mg total) under the tongue every 5 (five) minutes as needed. Maximum of 3 doses.  . pioglitazone (ACTOS) 30 MG tablet TAKE 1 TABLET BY MOUTH ONCE DAILY    Allergies: No known allergies  Social History   Tobacco Use  . Smoking status: Former Smoker    Packs/day: 1.00    Years: 5.00    Pack years: 5.00    Types: Cigarettes    Last attempt to quit: 08/06/1972    Years since quitting: 45.7  . Smokeless tobacco: Never Used  Substance Use Topics  . Alcohol use: No    Alcohol/week: 0.0 standard drinks    Comment: 09/26/2016 "quit 01/30/12"  . Drug use: No    Family History  Problem Relation Age of Onset  . Dementia Mother   . Diabetes Mother   . Cancer Father   . Diabetes Sister   . Diabetes Brother   . Heart disease Other     Review of Systems: A 12-system review of systems was performed and was negative except as noted in the  HPI.  --------------------------------------------------------------------------------------------------  Physical Exam: BP 118/78 (BP Location: Left Arm, Patient Position: Sitting, Cuff Size: Normal)   Pulse 72   Ht '6\' 4"'  (1.93 m)   Wt 217 lb 4 oz (98.5 kg)   BMI 26.44 kg/m   General: NAD.  Accompanied by his wife. Lungs: Normal work of breathing. Clear to auscultation bilaterally without wheezes or crackles. Heart: Regular rate and rhythm without murmurs, rubs, or gallops. Non-displaced PMI. Ext: No lower extremity edema.  EKG: NSR with inferior Q waves.  Lab Results  Component Value Date   WBC 4.6 11/16/2017   HGB 13.9 11/16/2017   HCT 42.3 11/16/2017   MCV 92 11/16/2017   PLT 183 11/16/2017    Lab Results  Component Value Date   NA 141 08/17/2017   K 4.6 08/17/2017   CL 102 08/17/2017   CO2 23 08/17/2017   BUN 19 08/17/2017   CREATININE 1.11 08/17/2017   GLUCOSE 115 (H) 08/17/2017   ALT 21 08/17/2017    Lab Results  Component Value Date   CHOL 91 (L) 08/17/2017   HDL 36 (L) 08/17/2017   LDLCALC 37 08/17/2017   TRIG 89 08/17/2017   CHOLHDL 2.5 08/17/2017    --------------------------------------------------------------------------------------------------  ASSESSMENT AND PLAN: Coronary artery disease without  angina Mr. Sanchez denies chest pain and has not had a significant shortness of breath with gradually increasing his cycling regimen.  Though stress test was interpreted as intermediate risk, I have personally reviewed the images and note only a small apical inferior and apical lateral fixed defect consistent with scar or possible artifact.  There is no significant ischemia.  We will continue Mr. Mahrt current medications.  I have encouraged him to gradually increase his exercise regimen, including biking.  He should contact us if his symptoms worsen.  Follow-up: Return to clinic as planned in November.  Greater than 25 minutes was spent face-to-face  with Mr. Encinas and his wife reviewing the results of his recent test and providing counseling regarding his coronary artery disease.  Nelva Bush, MD 04/14/2018 1:26 PM

## 2018-04-15 ENCOUNTER — Encounter: Payer: Self-pay | Admitting: Internal Medicine

## 2018-05-03 IMAGING — CR DG CHEST 2V
2 series · 2 of 2 positions shown · non-contrast
Comparison: Chest x-ray of 10/08/2016

CLINICAL DATA: CABG on 09/24/2016, some chest soreness

EXAM:
CHEST  2 VIEW

[w chest pa]
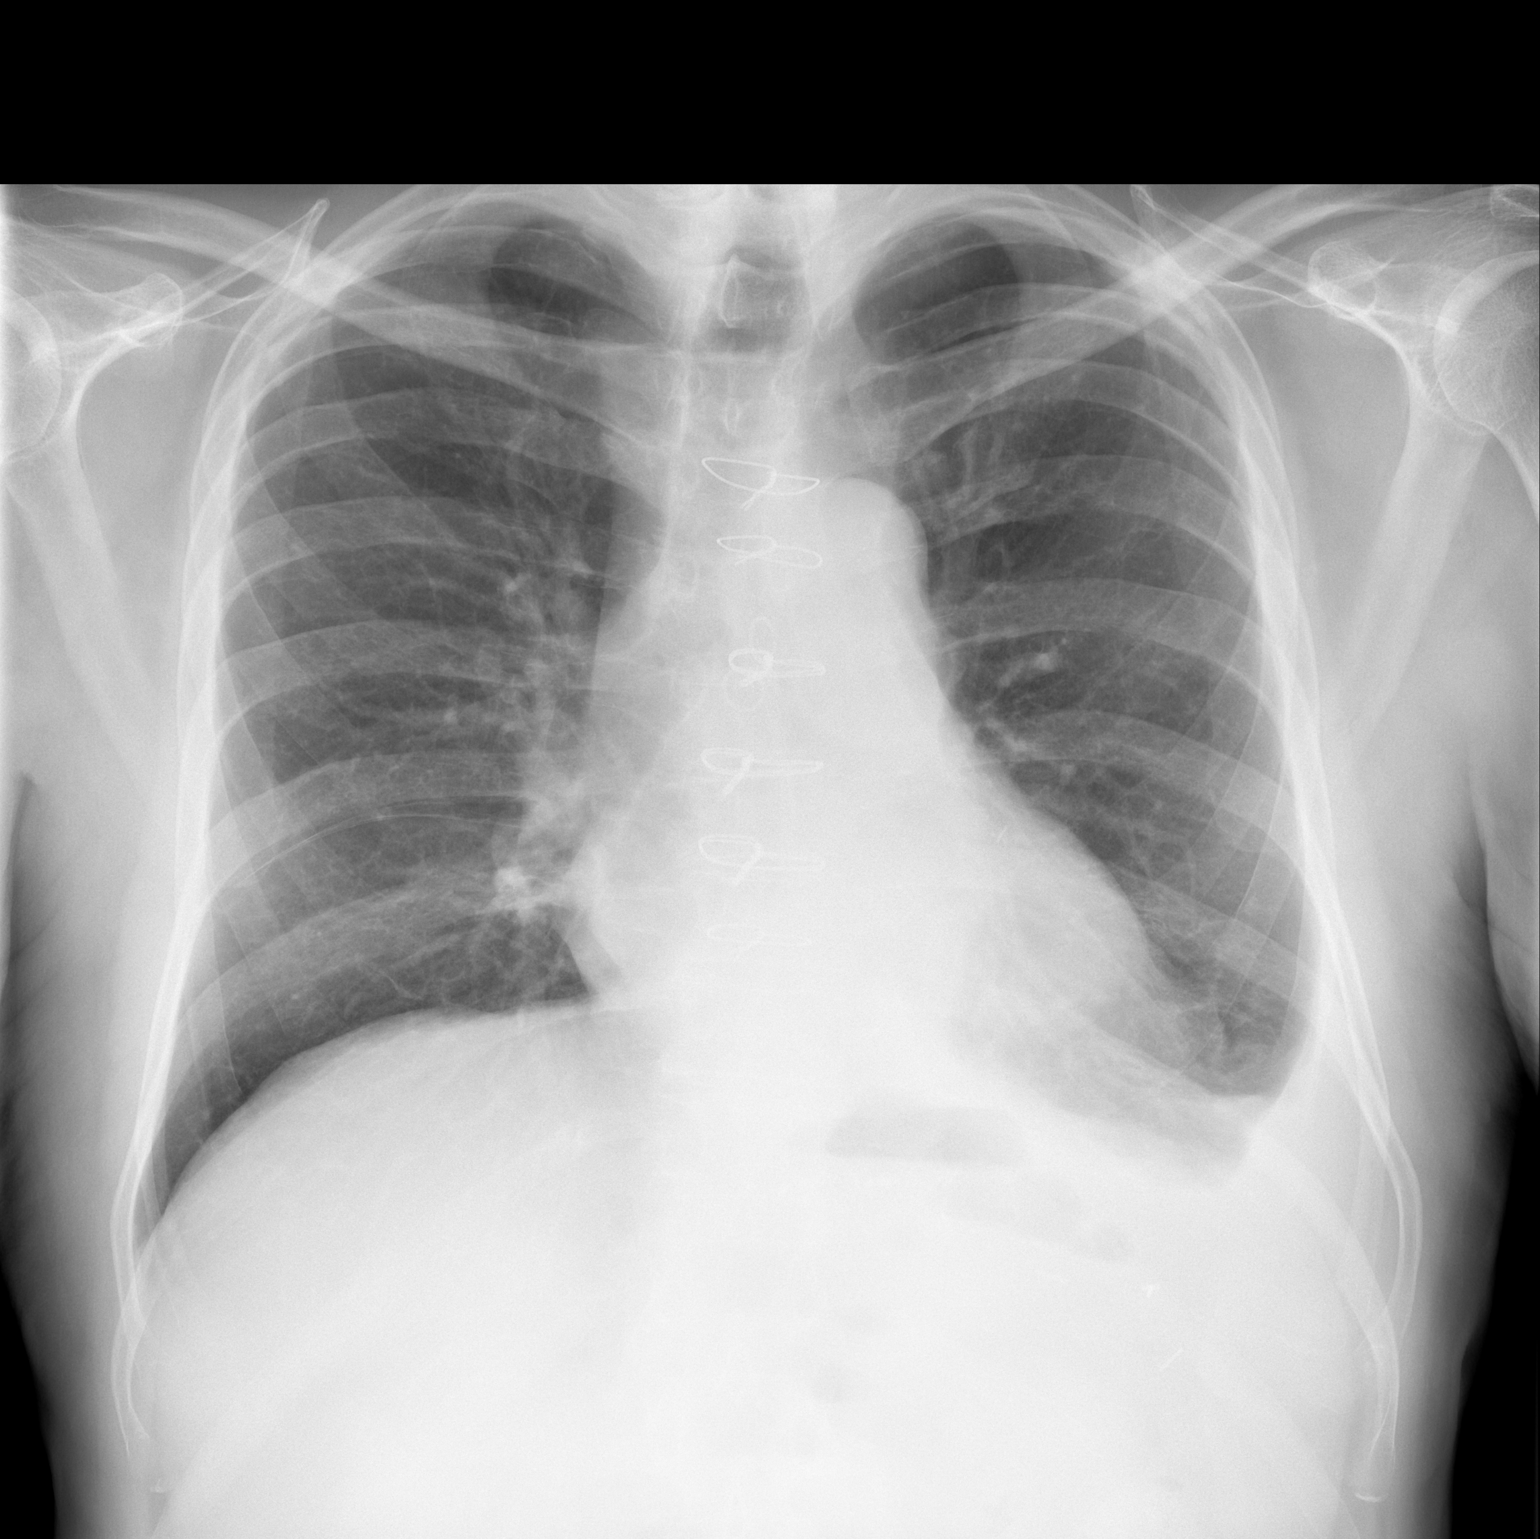

[w chest lat]
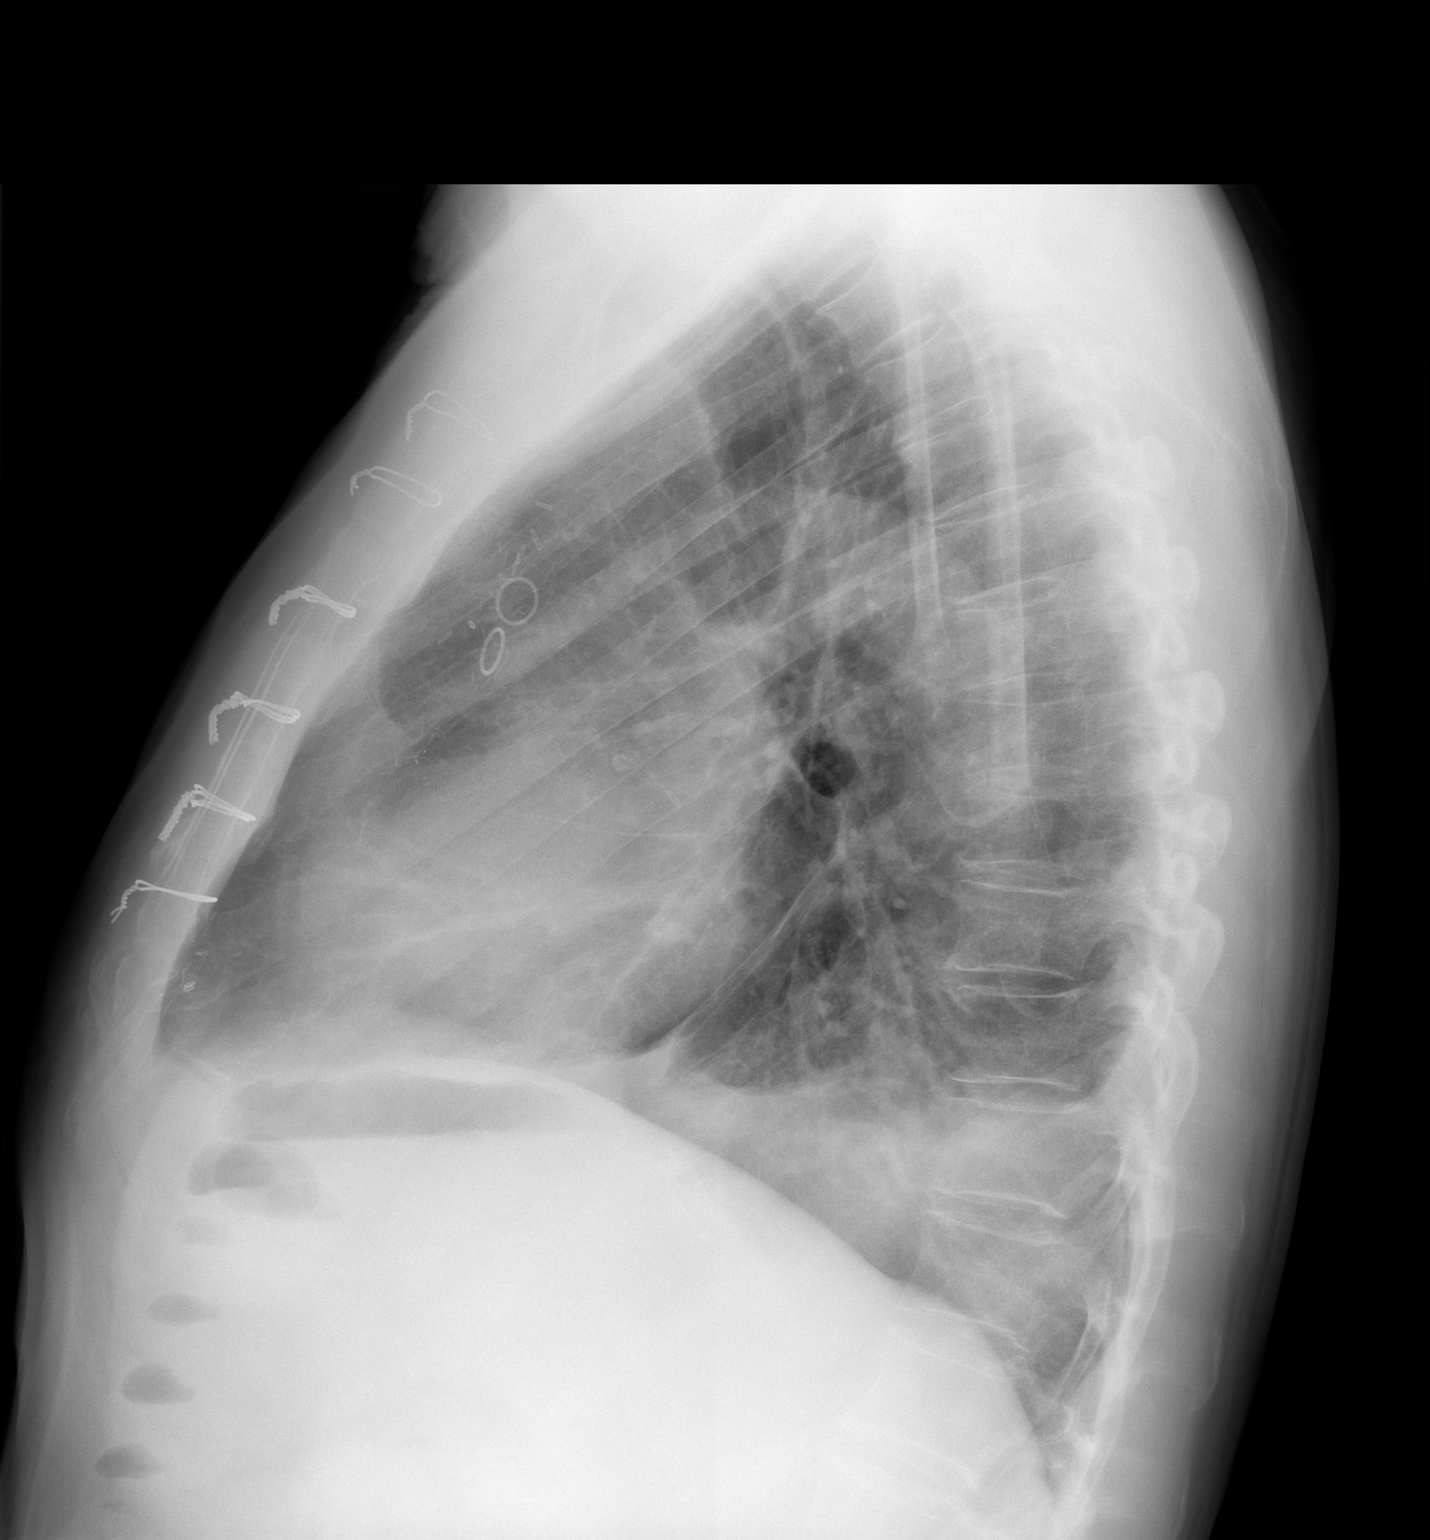

[2 of 2 positions shown; findings below may reference images not displayed]

FINDINGS: Although the left pleural effusion is more obvious on the frontal
view, on the lateral view it appears to have diminished in volume in
the interval. The right lung remains clear. No pneumothorax is seen.
Heart size is stable and median sternotomy sutures are noted.
IMPRESSION: Decrease in size of small left pleural effusion with mild left
basilar atelectasis.

## 2018-06-07 ENCOUNTER — Other Ambulatory Visit: Payer: Self-pay | Admitting: Family Medicine

## 2018-06-13 ENCOUNTER — Other Ambulatory Visit: Payer: Self-pay | Admitting: Family Medicine

## 2018-06-13 DIAGNOSIS — G629 Polyneuropathy, unspecified: Secondary | ICD-10-CM

## 2018-06-23 ENCOUNTER — Encounter: Payer: Self-pay | Admitting: Internal Medicine

## 2018-06-23 ENCOUNTER — Ambulatory Visit (INDEPENDENT_AMBULATORY_CARE_PROVIDER_SITE_OTHER): Payer: Medicare Other | Admitting: Internal Medicine

## 2018-06-23 VITALS — BP 126/78 | HR 66 | Ht 75.0 in | Wt 220.2 lb

## 2018-06-23 DIAGNOSIS — E118 Type 2 diabetes mellitus with unspecified complications: Secondary | ICD-10-CM | POA: Diagnosis not present

## 2018-06-23 DIAGNOSIS — E785 Hyperlipidemia, unspecified: Secondary | ICD-10-CM | POA: Diagnosis not present

## 2018-06-23 DIAGNOSIS — I2510 Atherosclerotic heart disease of native coronary artery without angina pectoris: Secondary | ICD-10-CM | POA: Diagnosis not present

## 2018-06-23 DIAGNOSIS — I255 Ischemic cardiomyopathy: Secondary | ICD-10-CM

## 2018-06-23 NOTE — Patient Instructions (Signed)
Medication Instructions:  Your physician recommends that you continue on your current medications as directed. Please refer to the Current Medication list given to you today.  If you need a refill on your cardiac medications before your next appointment, please call your pharmacy.   Lab work: none If you have labs (blood work) drawn today and your tests are completely normal, you will receive your results only by: . MyChart Message (if you have MyChart) OR . A paper copy in the mail If you have any lab test that is abnormal or we need to change your treatment, we will call you to review the results.  Testing/Procedures: none  Follow-Up: At [facility], you and your health needs are our priority.  As part of our continuing mission to provide you with exceptional heart care, we have created designated Provider Care Teams.  These Care Teams include your primary Cardiologist (physician) and Advanced Practice Providers (APPs -  Physician Assistants and Nurse Practitioners) who all work together to provide you with the care you need, when you need it. You will need a follow up appointment in 6 months.  Please call our office 2 months in advance to schedule this appointment.  You may see DR CHRISTOPHER END or one of the following Advanced Practice Providers on your designated Care Team:   Christopher Berge, NP Ryan Dunn, PA-C . Jacquelyn Visser, PA-C     

## 2018-06-23 NOTE — Progress Notes (Signed)
Follow-up Outpatient Visit Date: 06/23/2018  Primary Care Provider: Birdie Sons, MD [address] [place] [address]  Chief Complaint: Follow-up CAD  HPI:  Mr. Harkins is a 77 y.o. year-old male with history of coronary artery disease status post CABG (09/5051) complicated by postoperative atrial fibrillation, ischemic cardiomyopathy(LVEF normal by echo in 04/2017), type 2 diabetes mellitus, and hypertension, who presents for follow-up of coronary artery disease.  I last saw Mr. Mcclafferty in September, at which time he was doing well other than some shortness of breath with strenuous biking.  I had previously advised him to gradually the intensity of his workout, which he was doing.  Exercise MPI earlier in September showed good exercise capacity with a small fixed apical inferior and lateral defect.  Today, Mr. Criger reports that he is feeling fairly well.  He remains quite active, riding his bike 2 to 3 days a week.  He is now able to ride steep hills better than at our last visit.  He also continues to play golf and go to the gym without any limitations other than soreness in his knees.  He has not had chest pain, shortness of breath, palpitations, and edema.  His wife notes that Mr. Laban has some days when he just feels tired.  He notes occasional dietary indiscretion such as eating an entire box of Christmas cookies, as he did yesterday.  He has been compliant with his medications, which he is tolerating well.  --------------------------------------------------------------------------------------------------  Cardiovascular History & Procedures: Cardiovascular Problems:  Coronary artery disease status post CABG (09/2016)  Ischemic cardiomyopathy  Postoperative atrial fibrillation  Risk Factors:  Known coronary artery disease, diabetes mellitus, hypertension, age greater than 59, and male gender  Cath/PCI:  LHC (09/16/16): Significant two-vessel CAD with 95%  proximal LAD stenosis involving ostium of D1, 90% ostial ramus intermedius stenosis, and at least 50% lesions involving the ostium and mid section of the LCx. RCA with 25% distal stenosis. LVEF 55-60% with normal LVEDP.  CV Surgery:  CABG (09/24/16, Dr. Roxan Hockey): LIMA to LAD, SVG to D1, and sequential SVG to first and second branches of ramus intermedius.  EP Procedures and Devices:  None  Non-Invasive Evaluation(s):  Exercise MPI (04/07/2018): Interpreted as intermediate risk with possible apical anterior and apical ischemia and small inferolateral infarct.  Normal LVEF.  Greater than 1 mm ST depression at stress.  I personally reviewed the images and believe that the apical defect represents artifact, as there is normal perfusion in this region on the non-attenuation corrected images.  Only perfusion abnormality is a small apical inferior/lateral defect that may represent prior infarct, consistent with a low risk study.  Transthoracic echocardiogram (04/23/17): Normal LV size. LVEF 55-60% with anteroseptal hypokinesis (? related to prior cardiac surgery). Normal diastolic function. Borderline dilated aortic root (3.7 cm). Mild MR. Normal RV size and function. Normal pulmonary artery pressure.  Carotid Doppler (09/23/16): Mild (1-39%) stenoses involving the left and right internal carotid arteries. Vertebral arteries with antegrade flow.  ABIs (09/23/16): Right 1.98 and the left 1.49 suggestive of medial calcification.  Transthoracic echocardiogram (09/19/16): Normal LV size with mildly reduced contraction (EF 45-50%) with basal and mid anterior and anteroseptal hypokinesis. Grade 1 diastolic dysfunction. Mildly dilated left atrium. Normal RV size and function.  Recent CV Pertinent Labs: Lab Results  Component Value Date   CHOL 91 (L) 08/17/2017   HDL 36 (L) 08/17/2017   LDLCALC 37 08/17/2017   TRIG 89 08/17/2017   CHOLHDL 2.5  08/17/2017   INR 1.17 09/29/2016   BNP 60.0 11/16/2017    K 4.6 08/17/2017   MG 1.9 09/25/2016   BUN 19 08/17/2017   CREATININE 1.11 08/17/2017    Past medical and surgical history were reviewed and updated in EPIC.  Current Meds  Medication Sig  . ACCU-CHEK AVIVA PLUS test strip CHECK BLOOD SUGAR ONCE  DAILY  . allopurinol (ZYLOPRIM) 100 MG tablet TAKE 1 TABLET DAILY  . aspirin EC 81 MG tablet Take 1 tablet (81 mg total) by mouth daily.  Marland Kitchen atorvastatin (LIPITOR) 40 MG tablet TAKE 1 TABLET BY MOUTH AT BEDTIME  . Blood Glucose Monitoring Suppl (ACCU-CHEK AVIVA PLUS) w/Device KIT USE TO CHECK BLOOD SUGAR  DAILY AS DIRECTED  . empagliflozin (JARDIANCE) 10 MG TABS tablet Take 10 mg by mouth daily.  Marland Kitchen gabapentin (NEURONTIN) 300 MG capsule TAKE 1 CAPSULE BY MOUTH AT BEDTIME FOR  NEUROPATHY  . glipiZIDE (GLUCOTROL) 10 MG tablet Take 10 mg by mouth daily before breakfast.  . ketoconazole (NIZORAL) 2 % shampoo USE ON SCALP TWICE WEEKLY  . metFORMIN (GLUCOPHAGE) 1000 MG tablet TAKE 1 TABLET TWICE A DAY WITH A MEAL  . nitroGLYCERIN (NITROSTAT) 0.4 MG SL tablet Place 1 tablet (0.4 mg total) under the tongue every 5 (five) minutes as needed. Maximum of 3 doses.  . pioglitazone (ACTOS) 30 MG tablet TAKE 1 TABLET BY MOUTH ONCE DAILY    Allergies: No known allergies  Social History   Tobacco Use  . Smoking status: Former Smoker    Packs/day: 1.00    Years: 5.00    Pack years: 5.00    Types: Cigarettes    Last attempt to quit: 08/06/1972    Years since quitting: 45.9  . Smokeless tobacco: Never Used  Substance Use Topics  . Alcohol use: No    Alcohol/week: 0.0 standard drinks    Comment: 09/26/2016 "quit 01/30/12"  . Drug use: No    Family History  Problem Relation Age of Onset  . Dementia Mother   . Diabetes Mother   . Cancer Father   . Diabetes Sister   . Diabetes Brother   . Heart disease Other     Review of Systems: A 12-system review of systems was performed and was negative except as noted in the  HPI.  --------------------------------------------------------------------------------------------------  Physical Exam: BP 126/78 (BP Location: Left Arm, Patient Position: Sitting, Cuff Size: Normal)   Pulse 66   Ht _0  (1.905 m)   Wt 220 lb 4 oz (99.9 kg)   BMI 27.53 kg/m   General: NAD. HEENT: No conjunctival pallor or scleral icterus. Moist mucous membranes.  OP clear. Neck: Supple without lymphadenopathy, thyromegaly, JVD, or HJR. Lungs: Normal work of breathing. Clear to auscultation bilaterally without wheezes or crackles. Heart: Regular rate and rhythm without murmurs, rubs, or gallops. Non-displaced PMI. Abd: Bowel sounds present. Soft, NT/ND without hepatosplenomegaly Ext: No lower extremity edema.  2+ radial pulses bilaterally. Skin: Warm and dry without rash.  EKG: Normal sinus rhythm with inferior Q waves.  No significant change from prior tracing.  Lab Results  Component Value Date   WBC 4.6 11/16/2017   HGB 13.9 11/16/2017   HCT 42.3 11/16/2017   MCV 92 11/16/2017   PLT 183 11/16/2017    Lab Results  Component Value Date   NA 141 08/17/2017   K 4.6 08/17/2017   CL 102 08/17/2017   CO2 23 08/17/2017   BUN 19 08/17/2017   CREATININE 1.11 08/17/2017  GLUCOSE 115 (H) 08/17/2017   ALT 21 08/17/2017    Lab Results  Component Value Date   CHOL 91 (L) 08/17/2017   HDL 36 (L) 08/17/2017   LDLCALC 37 08/17/2017   TRIG 89 08/17/2017   CHOLHDL 2.5 08/17/2017    --------------------------------------------------------------------------------------------------  ASSESSMENT AND PLAN: Coronary artery disease without angina Mr. Mccabe is doing well with improved exercise capacity.  I encouraged him to continue his regular activity.  We have agreed to defer additional testing and medication changes at this time.  He will remain on aspirin and statin therapy for secondary prevention of CAD.  Ischemic cardiomyopathy Mr. Bergquist is well compensated and appears  euvolemic on exam.  No medication changes at this time.  LVEF 55-60% on most recent echo in 04/2017.  Hyperlipidemia LDL at goal.  Continue atorvastatin 40 mg daily.  Type 2 diabetes mellitus Most recent hemoglobin A1c was slightly above goal at 7.1.  We discussed the importance of avoiding dietary indiscretion.  He should continue his current diabetes medications and follow-up with Dr. Caryn Section, as previously discussed.  Follow-up: Return to clinic in 6 months.  Nelva Bush, MD 06/23/2018 11:18 AM

## 2018-07-08 ENCOUNTER — Other Ambulatory Visit: Payer: Self-pay | Admitting: Family Medicine

## 2018-07-20 ENCOUNTER — Telehealth: Payer: Self-pay | Admitting: Internal Medicine

## 2018-07-20 NOTE — Telephone Encounter (Signed)
Pt states his insurance is not covering Jardiance. Please call to discuss alternatives.

## 2018-07-20 NOTE — Telephone Encounter (Signed)
Patient states his Stuart Henry is no longer on the preferred list for his insurance. He would like to know what he could take instead from Dr Saunders Revel. Stuart Henry is actually prescribed by Dr Caryn Section. Advised patient he would need to contact Dr Caryn Section for further advice concerning management. He was appreciative.

## 2018-07-30 ENCOUNTER — Telehealth: Payer: Self-pay

## 2018-07-30 NOTE — Telephone Encounter (Signed)
Called pt to schedule AWV and pt declined stating he has a lot going on with his wife right now and that he doesn't have time for an extra apt. FYI! -MM

## 2018-08-18 ENCOUNTER — Encounter: Payer: Self-pay | Admitting: Family Medicine

## 2018-08-18 ENCOUNTER — Ambulatory Visit (INDEPENDENT_AMBULATORY_CARE_PROVIDER_SITE_OTHER): Payer: Medicare Other | Admitting: Family Medicine

## 2018-08-18 VITALS — BP 150/88 | HR 61 | Temp 97.7°F | Resp 16 | Ht 75.0 in | Wt 223.0 lb

## 2018-08-18 DIAGNOSIS — Z0000 Encounter for general adult medical examination without abnormal findings: Secondary | ICD-10-CM

## 2018-08-18 DIAGNOSIS — E1129 Type 2 diabetes mellitus with other diabetic kidney complication: Secondary | ICD-10-CM

## 2018-08-18 DIAGNOSIS — M109 Gout, unspecified: Secondary | ICD-10-CM

## 2018-08-18 DIAGNOSIS — E1165 Type 2 diabetes mellitus with hyperglycemia: Secondary | ICD-10-CM

## 2018-08-18 DIAGNOSIS — E785 Hyperlipidemia, unspecified: Secondary | ICD-10-CM

## 2018-08-18 DIAGNOSIS — I2510 Atherosclerotic heart disease of native coronary artery without angina pectoris: Secondary | ICD-10-CM

## 2018-08-18 DIAGNOSIS — IMO0002 Reserved for concepts with insufficient information to code with codable children: Secondary | ICD-10-CM

## 2018-08-18 DIAGNOSIS — Z125 Encounter for screening for malignant neoplasm of prostate: Secondary | ICD-10-CM

## 2018-08-18 MED ORDER — DAPAGLIFLOZIN PROPANEDIOL 5 MG PO TABS: 5.0000 mg | ORAL_TABLET | Freq: Every day | ORAL | 4 refills | Status: DC

## 2018-08-18 NOTE — Patient Instructions (Signed)
.   Please bring all of your medications to every appointment so we can make sure that our medication list is the same as yours.   

## 2018-08-18 NOTE — Progress Notes (Signed)
Patient: Stuart Henry, Male    DOB: 05-Jul-1941, 78 y.o.   MRN: [id number] Visit Date: 08/18/2018  Today's Provider: [name], MD   Chief Complaint  Patient presents with  . Medicare Wellness  . Diabetes  . Hypertension  . Coronary Artery Disease  . Hyperlipidemia   Subjective:    Annual wellness visit Stuart Henry is a 78 y.o. male. He feels fairly well. He reports exercising 3 times a week when the weather is good. He reports he is sleeping fairly well.  -----------------------------------------------------------  Diabetes Mellitus Type II, Follow-up:   Lab Results  Component Value Date   HGBA1C 7.1 (A) 03/19/2018   HGBA1C 6.6 11/16/2017   HGBA1C 6.4 (H) 08/17/2017    Last seen for diabetes 5 months ago.  Management since then includes no changes. He reports good compliance with treatment. He is not having side effects.  Current symptoms include none and have been stable. Home blood sugar records: fasting range: 130-150  Episodes of hypoglycemia? no   Current Insulin Regimen: none Most Recent Eye Exam: 6 months ago Weight trend: fluctuating a bit Prior visit with dietician: no Current diet: in general, an "unhealthy" diet Current exercise: bicycling  He states he hasn't been following his diet as well the last month or two and expects his a1c to be up a bit.   Pertinent Labs:    Component Value Date/Time   CHOL 91 (L) 08/17/2017 1051   TRIG 89 08/17/2017 1051   HDL 36 (L) 08/17/2017 1051   LDLCALC 37 08/17/2017 1051   CREATININE 1.11 08/17/2017 1051    Wt Readings from Last 3 Encounters:  08/18/18 223 lb (101.2 kg)  06/23/18 220 lb 4 oz (99.9 kg)  04/14/18 217 lb 4 oz (98.5 kg)    ------------------------------------------------------------------------  Hypertension, follow-up:  BP Readings from Last 3 Encounters:  08/18/18 (!) 150/88  06/23/18 126/78  04/14/18 118/78    He was last seen for hypertension 5 months ago.    BP at that visit was 120/60. Management since that visit includes no changes. He reports good compliance with treatment. He is not having side effects.  He is exercising. He is adherent to low salt diet.   Outside blood pressures are not checked. He is experiencing none.  Patient denies chest pain, chest pressure/discomfort, claudication, dyspnea, exertional chest pressure/discomfort, fatigue, irregular heart beat, lower extremity edema, near-syncope, orthopnea, palpitations, paroxysmal nocturnal dyspnea, syncope and tachypnea.   Cardiovascular risk factors include advanced age (older than 94 for men, 54 for women), diabetes mellitus, dyslipidemia, hypertension and male gender.  Use of agents associated with hypertension: NSAIDS.     Weight trend: fluctuating a bit Wt Readings from Last 3 Encounters:  08/18/18 223 lb (101.2 kg)  06/23/18 220 lb 4 oz (99.9 kg)  04/14/18 217 lb 4 oz (98.5 kg)    Current diet: in general, an "unhealthy" diet  ------------------------------------------------------------------------  Follow up for CAD:  The patient was last seen for this 9 months ago. Changes made at last visit include none. Patient was advised to continue regular follow up with Dr. Saunders Revel.  He reports good compliance with treatment. He feels that condition is stable. He is not having side effects.   ------------------------------------------------------------------------------------   Follow up for GERD:  The patient was last seen for this 1 years ago. Changes made at last visit include none.  He reports good compliance with treatment. He feels that condition is Improved.   ------------------------------------------------------------------------------------  Lipid/Cholesterol, Follow-up:   Last seen for this1 years ago.  Management changes since that visit include none. . Last Lipid Panel:    Component Value Date/Time   CHOL 91 (L) 08/17/2017 1051   TRIG 89  08/17/2017 1051   HDL 36 (L) 08/17/2017 1051   CHOLHDL 2.5 08/17/2017 1051   [place] 37 08/17/2017 1051    Risk factors for vascular disease include diabetes mellitus, hypercholesterolemia and hypertension  He reports good compliance with treatment. He is not having side effects.  Current symptoms include none and have been stable. Weight trend: fluctuating a bit Prior visit with dietician: no Current diet: in general, an "unhealthy" diet Current exercise: bicycling  Wt Readings from Last 3 Encounters:  08/18/18 223 lb (101.2 kg)  06/23/18 220 lb 4 oz (99.9 kg)  04/14/18 217 lb 4 oz (98.5 kg)    -------------------------------------------------------------------   Review of Systems  Constitutional: Negative for appetite change, chills, fatigue and fever.  HENT: Negative for congestion, ear pain, hearing loss, nosebleeds and trouble swallowing.   Eyes: Negative for pain and visual disturbance.  Respiratory: Negative for cough, chest tightness and shortness of breath.   Cardiovascular: Negative for chest pain, palpitations and leg swelling.  Gastrointestinal: Negative for abdominal pain, blood in stool, constipation, diarrhea, nausea and vomiting.  Endocrine: Negative for polydipsia, polyphagia and polyuria.  Genitourinary: Negative for dysuria and flank pain.  Musculoskeletal: Negative for arthralgias, back pain, joint swelling, myalgias and neck stiffness.  Skin: Negative for color change, rash and wound.  Neurological: Negative for dizziness, tremors, seizures, speech difficulty, weakness, light-headedness and headaches.  Psychiatric/Behavioral: Negative for behavioral problems, confusion, decreased concentration, dysphoric mood and sleep disturbance. The patient is not nervous/anxious.   All other systems reviewed and are negative.   Social History   Socioeconomic History  . Marital status: Married    Spouse name: Not on file  . Number of children: 0  . Years of  education: Not on file  . Highest education level: Bachelor's degree (e.g., BA, AB, BS)  Occupational History  . Occupation: Retired  Scientific laboratory technician  . Financial resource strain: Not hard at all  . Food insecurity:    Worry: Never true    Inability: Never true  . Transportation needs:    Medical: No    Non-medical: No  Tobacco Use  . Smoking status: Former Smoker    Packs/day: 1.00    Years: 5.00    Pack years: 5.00    Types: Cigarettes    Last attempt to quit: 08/06/1972    Years since quitting: 46.0  . Smokeless tobacco: Never Used  Substance and Sexual Activity  . Alcohol use: No    Alcohol/week: 0.0 standard drinks    Comment: 09/26/2016 "quit 01/30/12"  . Drug use: No  . Sexual activity: Never  Lifestyle  . Physical activity:    Days per week: 5 days    Minutes per session: 60 min  . Stress: Not at all  Relationships  . Social connections:    Talks on phone: Not on file    Gets together: Not on file    Attends religious service: Not on file    Active member of club or organization: Not on file    Attends meetings of clubs or organizations: Not on file    Relationship status: Not on file  . Intimate partner violence:    Fear of current or ex partner: No    Emotionally abused: No    Physically  abused: No    Forced sexual activity: No  Other Topics Concern  . Not on file  Social History Narrative  . Not on file    Past Medical History:  Diagnosis Date  . History of gout    "controlled w/daily RX:" (09/26/2016)  . History of hiatal hernia    S/P OR     Patient Active Problem List   Diagnosis Date Noted  . Type 2 diabetes mellitus with complication, without long-term current use of insulin ([place]) 09/23/2017  . Chronic fatigue 05/20/2017  . Ischemic cardiomyopathy 01/02/2017  . GERD (gastroesophageal reflux disease)   . Paroxysmal atrial fibrillation ([place]) 10/08/2016  . 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 Coronary artery disease 09/24/2016  . Allergic rhinitis  02/22/2015  . BPH (benign prostatic hyperplasia) 02/22/2015  . Dermatitis 02/22/2015  . H/O adenomatous polyp of colon 02/22/2015  . Skin lesion 02/22/2015  . Type II diabetes mellitus with renal manifestations, uncontrolled ([place]) 01/25/2009  . Polyneuropathy 07/18/2008  . Hyperlipidemia LDL goal <70 12/15/2006  . Essential (primary) hypertension 08/04/2004  . 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 ANTERIOR CERVICAL DECOMP/DISCECTOMY FUSION     "titanium disc replacemen; ?C3t"  . BACK SURGERY    . [place], No stent  . CATARACT EXTRACTION W/ INTRAOCULAR LENS  IMPLANT, BILATERAL Bilateral   . COLONOSCOPY WITH PROPOFOL N/A 02/09/2015   Procedure: COLONOSCOPY WITH PROPOFOL;  Surgeon: Manya Silvas, MD;  Location: [facility] ENDOSCOPY;  Service: Endoscopy;  Laterality: N/A;  . CORONARY ARTERY BYPASS GRAFT N/A 09/24/2016   Procedure: CORONARY ARTERY BYPASS GRAFTING (CABG) x four, using left internal mammary artery and right leg greater saphenous vein harvested endoscopically;  Surgeon: Melrose Nakayama, MD;  Location: [facility];  Service: Open Heart Surgery;  Laterality: N/A;  . EXPLORATION POST OPERATIVE OPEN HEART    . HIATAL HERNIA REPAIR  ~ 2003   "laparoscopic"  . KNEE ARTHROSCOPY Left   . LEFT HEART CATH AND CORONARY ANGIOGRAPHY Left 09/16/2016   Procedure: Left Heart Cath and Coronary Angiography;  Surgeon: Nelva Bush, MD;  Location: [facility] CV LAB;  Service: Cardiovascular;  Laterality: Left;  . PATELLAR TENDON REPAIR Right   . TEE WITHOUT CARDIOVERSION N/A 09/24/2016   Procedure: TRANSESOPHAGEAL ECHOCARDIOGRAM (TEE);  Surgeon: Melrose Nakayama, MD;  Location: [facility];  Service: Open Heart Surgery;  Laterality: N/A;    His family history includes Cancer in his father; Dementia in his mother; Diabetes in his brother, mother, and sister; Heart disease in an other family member.      Current  Outpatient Medications:  .  ACCU-CHEK AVIVA PLUS test strip, CHECK BLOOD SUGAR ONCE  DAILY, Disp: 100 each, Rfl: 4 .  allopurinol (ZYLOPRIM) 100 MG tablet, TAKE 1 TABLET DAILY, Disp: 90 tablet, Rfl: 4 .  aspirin EC 81 MG tablet, Take 1 tablet (81 mg total) by mouth daily., Disp: 90 tablet, Rfl: 3 .  atorvastatin (LIPITOR) 40 MG tablet, TAKE 1 TABLET BY MOUTH AT BEDTIME, Disp: 90 tablet, Rfl: 4 .  Blood Glucose Monitoring Suppl (ACCU-CHEK AVIVA PLUS) w/Device KIT, USE TO CHECK BLOOD SUGAR  DAILY AS DIRECTED, Disp: 1 kit, Rfl: 0 .  empagliflozin (JARDIANCE) 10 MG TABS tablet, Take 10 mg by mouth daily., Disp: 30 tablet, Rfl: 12 .  gabapentin (NEURONTIN) 300 MG capsule, TAKE 1 CAPSULE BY MOUTH AT BEDTIME FOR  NEUROPATHY, Disp: 90 capsule, Rfl: 4 .  glipiZIDE (GLUCOTROL) 10 MG tablet, Take 10 mg by mouth daily before breakfast., Disp: , Rfl:  .  ketoconazole (NIZORAL) 2 % shampoo, USE ON SCALP TWICE WEEKLY, Disp: 120 mL, Rfl: 5 .  metFORMIN (GLUCOPHAGE) 1000 MG tablet, TAKE 1 TABLET TWICE A DAY WITH A MEAL, Disp: 180 tablet, Rfl: 4 .  nitroGLYCERIN (NITROSTAT) 0.4 MG SL tablet, Place 1 tablet (0.4 mg total) under the tongue every 5 (five) minutes as needed. Maximum of 3 doses., Disp: 25 tablet, Rfl: 5 .  pioglitazone (ACTOS) 30 MG tablet, TAKE 1 TABLET BY MOUTH ONCE DAILY, Disp: 90 tablet, Rfl: 4  Patient Care Team: Birdie Sons, MD as PCP - General (Family Medicine) End, Harrell Gave, MD as Consulting Physician (Cardiology) Calvert Cantor, MD as Consulting Physician (Ophthalmology)     Objective:   Vitals: BP (!) 150/88 (BP Location: Right Arm, Cuff Size: Large)   Pulse 61   Temp 97.7 F (36.5 C) (Oral)   Resp 16   Ht '6\' 3"'  (1.905 m)   Wt 223 lb (101.2 kg)   SpO2 98% Comment: room air  BMI 27.87 kg/m   Physical Exam   General Appearance:    Alert, cooperative, no distress, appears stated age  Head:    Normocephalic, without obvious abnormality, atraumatic  Eyes:    PERRL,  conjunctiva/corneas clear, EOM's intact, fundi    benign, both eyes       Ears:    Normal TM's and external ear canals, both ears  Nose:   Nares normal, septum midline, mucosa normal, no drainage   or sinus tenderness  Throat:   Lips, mucosa, and tongue normal; teeth and gums normal  Neck:   Supple, symmetrical, trachea midline, no adenopathy;       thyroid:  No enlargement/tenderness/nodules; no carotid   bruit or JVD  Back:     Symmetric, no curvature, ROM normal, no CVA tenderness  Lungs:     Clear to auscultation bilaterally, respirations unlabored  Chest wall:    No tenderness or deformity  Heart:    Regular rate and rhythm, S1 and S2 normal, no murmur, rub   or gallop  Abdomen:     Soft, non-tender, bowel sounds active all four quadrants,    no masses, no organomegaly  Genitalia:    deferred  Rectal:    deferred  Extremities:   Extremities normal, atraumatic, no cyanosis or edema  Pulses:   2+ and symmetric all extremities  Skin:   Skin color, texture, turgor normal, no rashes or lesions  Lymph nodes:   Cervical, supraclavicular, and axillary nodes normal  Neurologic:   CNII-XII intact. Normal strength, sensation and reflexes      throughout    Activities of Daily Living In your present state of health, do you have any difficulty performing the following activities: 08/18/2018  Hearing? N  Vision? N  Difficulty concentrating or making decisions? N  Walking or climbing stairs? N  Dressing or bathing? N  Doing errands, shopping? N  Some recent data might be hidden    Fall Risk Assessment Fall Risk  08/18/2018 08/14/2017 11/14/2016 11/10/2016 05/13/2016  Falls in the past year? 0 No No No No  Number falls in past yr: 0 - - - -  Injury with Fall? 0 - - - -  Follow up Falls evaluation completed - - - -     Depression Screen PHQ 2/9 Scores 08/18/2018 08/14/2017 01/05/2017 11/14/2016  PHQ - 2 Score 0 0 2 0  PHQ- 9 Score 0 - 4 3    6CIT Screen 11/14/2016  What Year? 0 points    What month? 0 points  What time? 0 points  Count back from 20 0 points  Months in reverse 0 points  Repeat phrase 2 points  Total Score 2   Cognitive Testing - 6-CIT  Correct? Score   What year is it? yes 0 0 or 4  What month is it? yes 0 0 or 3  Memorize:    Pia Mau,  44,  [address]  [place],      What time is it? (within 1 hour) yes 0 0 or 3  Count backwards from 20 yes 0 0, 2, or 4  Name the months of the year yes 0 0, 2, or 4  Repeat name & address above yes 0 0, 2, 4, 6, 8, or 10       TOTAL SCORE  0/28   Interpretation:  Normal  Normal (0-7) Abnormal (8-28)    Assessment & Plan:     Annual Wellness Visit  Reviewed patient's Family Medical History Reviewed and updated list of patient's medical providers Assessment of cognitive impairment was done Assessed patient's functional ability Established a written schedule for health screening [place] Completed and Reviewed  Exercise Activities and Dietary recommendations Goals    . Decrease soda consumption     Recommend decreasing soda intake from 32 oz to 16 oz a day.    Marland Kitchen DIET - INCREASE WATER INTAKE     Recommend increasing water intake to 2-4 glasses a day.        Immunization History  Administered Date(s) Administered  . Influenza,inj,Quad PF,6+ Mos 05/02/2016  . Influenza-Unspecified 05/08/2017, 04/13/2018  . Pneumococcal Conjugate-13 03/10/2014  . Pneumococcal Polysaccharide-23 08/31/2006, 03/02/2012  . Td 08/04/2002  . Tdap 03/10/2014  . Zoster 06/16/2011  . Zoster Recombinat (Shingrix) 04/05/2018, 07/31/2018    Health Maintenance  Topic Date Due  . OPHTHALMOLOGY EXAM  01/29/2018  . INFLUENZA VACCINE  03/04/2018  . URINE MICROALBUMIN  06/17/2018  . FOOT EXAM  08/17/2018  . HEMOGLOBIN A1C  09/19/2018  . COLONOSCOPY  02/09/2020  . TETANUS/TDAP  03/10/2024  . PNA vac Low Risk Adult  Completed     Discussed health benefits of physical activity, and encouraged him to  engage in regular exercise appropriate for his age and condition.    ------------------------------------------------------------------------------------------------------------  1. Annual physical exam He states he already had flu vaccine at his pharmacy.   2. Coronary artery disease involving native coronary artery of native heart without angina pectoris Asymptomatic. Compliant with medication.  Continue aggressive risk factor modification.  Continue routine follow up with Dr. Saunders Revel - Comprehensive metabolic panel - Lipid panel  3. Type II diabetes mellitus with renal manifestations, uncontrolled ([place]) He states he received letter from his insurance that Vania Rea is no longer on formulary. Will see if  dapagliflozin propanediol (FARXIGA) 5 MG TABS tablet; Take 5 mg by mouth daily. TAKE IN THE PLACE OF JARDIANCE  Dispense: 90 tablet; Refill: 4  4. Hyperlipidemia LDL goal <70 He is tolerating atorvastatin well with no adverse effects.   - Comprehensive metabolic panel - Lipid panel  5. Prostate cancer screening  - PSA  6.  Gouty arthropathy Well controlled with allopurinol.  - Uric acid    [name], MD  [facility]

## 2018-08-19 ENCOUNTER — Telehealth: Payer: Self-pay

## 2018-08-19 LAB — COMPREHENSIVE METABOLIC PANEL
A/G RATIO: 2.2 (ref 1.2–2.2)
ALBUMIN: 4.6 g/dL (ref 3.5–4.8)
ALT: 17 IU/L (ref 0–44)
AST: 15 IU/L (ref 0–40)
Alkaline Phosphatase: 40 IU/L (ref 39–117)
BUN / CREAT RATIO: 15 (ref 10–24)
BUN: 19 mg/dL (ref 8–27)
Bilirubin Total: 0.8 mg/dL (ref 0.0–1.2)
CALCIUM: 9.6 mg/dL (ref 8.6–10.2)
CO2: 23 mmol/L (ref 20–29)
Chloride: 101 mmol/L (ref 96–106)
Creatinine, Ser: 1.25 mg/dL (ref 0.76–1.27)
GFR calc Af Amer: 65 mL/min/{1.73_m2} (ref >59)
GFR, EST NON AFRICAN AMERICAN: 56 mL/min/{1.73_m2} — AB (ref >59)
Globulin, Total: 2.1 g/dL (ref 1.5–4.5)
Glucose: 137 mg/dL — ABNORMAL HIGH (ref 65–99)
Potassium: 4.7 mmol/L (ref 3.5–5.2)
Sodium: 140 mmol/L (ref 134–144)
TOTAL PROTEIN: 6.7 g/dL (ref 6.0–8.5)

## 2018-08-19 LAB — LIPID PANEL
CHOL/HDL RATIO: 2.9 ratio (ref 0.0–5.0)
Cholesterol, Total: 103 mg/dL (ref 100–199)
HDL: 36 mg/dL — AB (ref >39)
LDL Calculated: 48 mg/dL (ref 0–99)
Triglycerides: 94 mg/dL (ref 0–149)
VLDL CHOLESTEROL CAL: 19 mg/dL (ref 5–40)

## 2018-08-19 LAB — PSA: PROSTATE SPECIFIC AG, SERUM: 0.4 ng/mL (ref 0.0–4.0)

## 2018-08-19 LAB — URIC ACID: URIC ACID: 4.2 mg/dL (ref 3.7–8.6)

## 2018-08-19 NOTE — Telephone Encounter (Signed)
Pt returned missed call.  Please call pt back. ° °Thanks, °TGH °

## 2018-08-19 NOTE — Telephone Encounter (Signed)
-----   Message from Birdie Sons, MD sent at 08/19/2018 12:15 PM EST ----- Cholesterol is very good at 103. Normal PSA, kidney and liver functions. A1c was not done. I must of forgot to add it to his routine labs. See if he can stop by to check a fingerstick A1c at no charge.

## 2018-08-19 NOTE — Telephone Encounter (Signed)
Tried calling patient. Left message to call back. 

## 2018-08-19 NOTE — Telephone Encounter (Signed)
-----   Message from Birdie Sons, MD sent at 08/19/2018  8:14 AM EST ----- Please add [id number] to labs drawn 08/19/2018

## 2018-08-19 NOTE — Telephone Encounter (Signed)
[facility] is unable to add an A1C.  They don't have the lavender top tube for this pt to preform the test.   Thanks,   -Mickel Baas

## 2018-08-20 ENCOUNTER — Telehealth: Payer: Self-pay

## 2018-08-20 NOTE — Telephone Encounter (Signed)
Left message to call back  

## 2018-08-20 NOTE — Telephone Encounter (Signed)
Patient returned your call. KW 

## 2018-08-23 NOTE — Telephone Encounter (Signed)
Pt coming in today to get his A1C completed.   Thanks,   -Mickel Baas

## 2018-08-23 NOTE — Telephone Encounter (Signed)
Pts A1C was 7.6 in the office today.  He states that he still has about two weeks left of Jardiance, and three month supply of [place] after the [place] in finished.  He says the [name] was $500 for the three month supply so he does not want to make any changes at this time if possible.     Thanks,   -Mickel Baas

## 2018-09-24 NOTE — Telephone Encounter (Signed)
Encounter opened in error. KW 

## 2018-10-07 LAB — HM DIABETES EYE EXAM

## 2018-11-22 ENCOUNTER — Telehealth: Payer: Self-pay | Admitting: Internal Medicine

## 2018-11-22 NOTE — Telephone Encounter (Signed)
Virtual Visit Pre-Appointment Phone Call  Steps For Call:  1. Confirm consent - "In the setting of the current Covid19 crisis, you are scheduled for a (phone or video) visit with your provider on (date) at (time).  Just as we do with many in-office visits, in order for you to participate in this visit, we must obtain consent.  If you'd like, I can send this to your mychart (if signed up) or email for you to review.  Otherwise, I can obtain your verbal consent now.  All virtual visits are billed to your insurance company just like a normal visit would be.  By agreeing to a virtual visit, we'd like you to understand that the technology does not allow for your provider to perform an examination, and thus may limit your provider's ability to fully assess your condition. If your provider identifies any concerns that need to be evaluated in person, we will make arrangements to do so.  Finally, though the technology is pretty good, we cannot assure that it will always work on either your or our end, and in the setting of a video visit, we may have to convert it to a phone-only visit.  In either situation, we cannot ensure that we have a secure connection.  Are you willing to proceed?" STAFF: Did the patient verbally acknowledge consent to telehealth visit? Document YES/NO here: YES  2. Confirm the Henry phone number to call the day of the visit by including in appointment notes  3. Give patient instructions for WebEx/MyChart download to smartphone as below or Doximity/Doxy.me if video visit (depending on what platform provider is using)  4. Advise patient to be prepared with their blood pressure, heart rate, weight, any heart rhythm information, their current medicines, and a piece of paper and pen handy for any instructions they may receive the day of their visit  5. Inform patient they will receive a phone call 15 minutes prior to their appointment time (may be from unknown caller ID) so they should be  prepared to answer  6. Confirm that appointment type is correct in Epic appointment notes (VIDEO vs PHONE)     TELEPHONE CALL NOTE  Stuart Henry has been deemed a candidate for a follow-up tele-health visit to limit community exposure during the Covid-19 pandemic. I spoke with the patient via phone to ensure availability of phone/video source, confirm preferred email & phone number, and discuss instructions and expectations.  I reminded Stuart Henry to be prepared with any vital sign and/or heart rhythm information that could potentially be obtained via home monitoring, at the time of his visit. I reminded Stuart Henry to expect a phone call at the time of his visit if his visit.  Clarisse Gouge 11/22/2018 9:09 AM   INSTRUCTIONS FOR DOWNLOADING THE WEBEX APP TO SMARTPHONE  - If Apple, ask patient to go to App Store and type in WebEx in the search bar. [place] [employer], the blue/green circle. If Android, go to [employer] and type in [employer] in the search bar. The app is free but as with any other app downloads, their phone may require them to verify saved payment information or Apple/Android password.  - The patient does NOT have to create an account. - On the day of the visit, the assist will walk the patient through joining the meeting with the meeting number/password.  INSTRUCTIONS FOR DOWNLOADING THE MYCHART APP TO SMARTPHONE  - The patient must first make  sure to have activated MyChart and know their login information - If Apple, go to [employer] and type in MyChart in the search bar and download the app. If Android, ask patient to go to [employer] and type in [place] in the search bar and download the app. The app is free but as with any other app downloads, their phone may require them to verify saved payment information or Apple/Android password.  - The patient will need to then log into the app with their MyChart username and password, and  select [facility] as their healthcare provider to link the account. When it is time for your visit, go to the MyChart app, find appointments, and click Begin Video Visit. Be sure to Select Allow for your device to access the Microphone and Camera for your visit. You will then be connected, and your provider will be with you shortly.  **If they have any issues connecting, or need assistance please contact MyChart service desk [phone])**  **If using a computer, in order to ensure the Henry quality for their visit they will need to use either of the following Internet Browsers: [employer], or Google Chrome**  IF USING DOXIMITY or DOXY.ME - The patient will receive a link just prior to their visit, either by text or email (to be determined day of appointment depending on if it's doxy.me or Doximity).     FULL LENGTH CONSENT FOR TELE-HEALTH VISIT   I hereby voluntarily request, consent and authorize [facility] and its employed or contracted physicians, physician assistants, nurse practitioners or other licensed health care professionals (the Practitioner), to provide me with telemedicine health care services (the Services") as deemed necessary by the treating Practitioner. I acknowledge and consent to receive the Services by the Practitioner via telemedicine. I understand that the telemedicine visit will involve communicating with the Practitioner through live audiovisual communication technology and the disclosure of certain medical information by electronic transmission. I acknowledge that I have been given the opportunity to request an in-person assessment or other available alternative prior to the telemedicine visit and am voluntarily participating in the telemedicine visit.  I understand that I have the right to withhold or withdraw my consent to the use of telemedicine in the course of my care at any time, without affecting my right to future care or treatment, and that  the Practitioner or I may terminate the telemedicine visit at any time. I understand that I have the right to inspect all information obtained and/or recorded in the course of the telemedicine visit and may receive copies of available information for a reasonable fee.  I understand that some of the potential risks of receiving the Services via telemedicine include:   Delay or interruption in medical evaluation due to technological equipment failure or disruption;  Information transmitted may not be sufficient (e.g. poor resolution of images) to allow for appropriate medical decision making by the Practitioner; and/or   In rare instances, security protocols could fail, causing a breach of personal health information.  Furthermore, I acknowledge that it is my responsibility to provide information about my medical history, conditions and care that is complete and accurate to the Henry of my ability. I acknowledge that Practitioner's advice, recommendations, and/or decision may be based on factors not within their control, such as incomplete or inaccurate data provided by me or distortions of diagnostic images or specimens that may result from electronic transmissions. I understand that the practice of medicine is not an exact  science and that Practitioner makes no warranties or guarantees regarding treatment outcomes. I acknowledge that I will receive a copy of this consent concurrently upon execution via email to the email address I last provided but may also request a printed copy by calling the office of [facility].    I understand that my insurance will be billed for this visit.   I have read or had this consent read to me.  I understand the contents of this consent, which adequately explains the benefits and risks of the Services being provided via telemedicine.   I have been provided ample opportunity to ask questions regarding this consent and the Services and have had my questions answered to  my satisfaction.  I give my informed consent for the services to be provided through the use of telemedicine in my medical care  By participating in this telemedicine visit I agree to the above.

## 2018-12-23 NOTE — Progress Notes (Signed)
Virtual Visit via Telephone Note   This visit type was conducted due to national recommendations for restrictions regarding the COVID-19 Pandemic (e.g. social distancing) in an effort to limit this patient's exposure and mitigate transmission in our community.  Due to his co-morbid illnesses, this patient is at least at moderate risk for complications without adequate follow up.  This format is felt to be most appropriate for this patient at this time.  The patient did not have access to video technology/had technical difficulties with video requiring transitioning to audio format only (telephone).  All issues noted in this document were discussed and addressed.  No physical exam could be performed with this format.  Please refer to the patient's chart for his  consent to telehealth for [facility].   Date:  12/24/2018   ID:  Stuart Henry, DOB Feb 21, 1941, MRN [id number]  Patient Location: Home Provider Location: Office  PCP:  Birdie Sons, MD  Cardiologist:  Nelva Bush, MD  Electrophysiologist:  None   Evaluation Performed:  Follow-Up Visit  Chief Complaint:  Follow-up coronary artery disease  History of Present Illness:    Stuart Henry is a 78 y.o. male with history of coronary artery disease status post CABG (03/2504) complicated by postoperative atrial fibrillation, ischemic cardiomyopathy(LVEF normal by echo in 04/2017), type 2 diabetes mellitus, and hypertension.  We are speaking today for follow-up of coronary artery disease.  I last saw Stuart Henry in 06/2018, at which time he was feeling well with less shortness of breath when riding his bike.  We did not make any medication changes.  Today, Stuart Henry reports that he is doing well.  He denies chest pain, shortness of breath, edema, palpitations, and lightheadedness.  He notes that he has gained a little bit of weight and has also noticed an upward trend in his blood sugar.  His most recent hemoglobin A1c was also  up to 7.6.  He attributes this to eating more carbs.  He continues to walk and ride his bike at least 3 days per week without any limitations.  He has not been playing golf during the COVID-19 pandemic.  He is tolerating his medications well.  He does not check his BP regularly, but states that when he does, it is typically lower than today's reading.  The patient does not have symptoms concerning for COVID-19 infection (fever, chills, cough, or new shortness of breath).    Past Medical History:  Diagnosis Date  . History of gout    "controlled w/daily RX:" (09/26/2016)  . History of hiatal hernia    S/P OR   Past Surgical History:  Procedure Laterality Date  . ANTERIOR CERVICAL DECOMP/DISCECTOMY FUSION     "titanium disc replacemen; ?C3t"  . BACK SURGERY    . [place], No stent  . CATARACT EXTRACTION W/ INTRAOCULAR LENS  IMPLANT, BILATERAL Bilateral   . COLONOSCOPY WITH PROPOFOL N/A 02/09/2015   Procedure: COLONOSCOPY WITH PROPOFOL;  Surgeon: Manya Silvas, MD;  Location: [facility] ENDOSCOPY;  Service: Endoscopy;  Laterality: N/A;  . CORONARY ARTERY BYPASS GRAFT N/A 09/24/2016   Procedure: CORONARY ARTERY BYPASS GRAFTING (CABG) x four, using left internal mammary artery and right leg greater saphenous vein harvested endoscopically;  Surgeon: Melrose Nakayama, MD;  Location: [place];  Service: Open Heart Surgery;  Laterality: N/A;  . EXPLORATION POST OPERATIVE OPEN HEART    . HIATAL HERNIA REPAIR  ~ 2003   "laparoscopic"  .  KNEE ARTHROSCOPY Left   . LEFT HEART CATH AND CORONARY ANGIOGRAPHY Left 09/16/2016   Procedure: Left Heart Cath and Coronary Angiography;  Surgeon: Nelva Bush, MD;  Location: [place] CV LAB;  Service: Cardiovascular;  Laterality: Left;  . PATELLAR TENDON REPAIR Right   . TEE WITHOUT CARDIOVERSION N/A 09/24/2016   Procedure: TRANSESOPHAGEAL ECHOCARDIOGRAM (TEE);  Surgeon: Melrose Nakayama, MD;  Location: [place];  Service:  Open Heart Surgery;  Laterality: N/A;     Current Meds  Medication Sig  . ACCU-CHEK AVIVA PLUS test strip CHECK BLOOD SUGAR ONCE  DAILY  . allopurinol (ZYLOPRIM) 100 MG tablet TAKE 1 TABLET DAILY  . aspirin EC 81 MG tablet Take 1 tablet (81 mg total) by mouth daily.  Marland Kitchen atorvastatin (LIPITOR) 40 MG tablet TAKE 1 TABLET BY MOUTH AT BEDTIME  . Blood Glucose Monitoring Suppl (ACCU-CHEK AVIVA PLUS) w/Device KIT USE TO CHECK BLOOD SUGAR  DAILY AS DIRECTED  . dapagliflozin propanediol (FARXIGA) 5 MG TABS tablet Take 5 mg by mouth daily. TAKE IN THE PLACE OF JARDIANCE  . gabapentin (NEURONTIN) 300 MG capsule TAKE 1 CAPSULE BY MOUTH AT BEDTIME FOR  NEUROPATHY  . glipiZIDE (GLUCOTROL) 10 MG tablet Take 10 mg by mouth daily before breakfast.  . ketoconazole (NIZORAL) 2 % shampoo USE ON SCALP TWICE WEEKLY  . metFORMIN (GLUCOPHAGE) 1000 MG tablet TAKE 1 TABLET TWICE A DAY WITH A MEAL  . nitroGLYCERIN (NITROSTAT) 0.4 MG SL tablet Place 1 tablet (0.4 mg total) under the tongue every 5 (five) minutes as needed. Maximum of 3 doses.  . pioglitazone (ACTOS) 30 MG tablet TAKE 1 TABLET BY MOUTH ONCE DAILY     Allergies:   No known allergies   Social History   Tobacco Use  . Smoking status: Former Smoker    Packs/day: 1.00    Years: 5.00    Pack years: 5.00    Types: Cigarettes    Last attempt to quit: 08/06/1972    Years since quitting: 46.4  . Smokeless tobacco: Never Used  Substance Use Topics  . Alcohol use: No    Alcohol/week: 0.0 standard drinks    Comment: 09/26/2016 "quit 01/30/12"  . Drug use: No     Family Hx: The patient's family history includes Cancer in his father; Dementia in his mother; Diabetes in his brother, mother, and sister; Heart disease in an other family member.  ROS:   Please see the history of present illness.   All other systems reviewed and are negative.   Prior CV studies:   The following studies were reviewed today:  Cath/PCI:  LHC (09/16/16): Significant  two-vessel CAD with 95% proximal LAD stenosis involving ostium of D1, 90% ostial ramus intermedius stenosis, and at least 50% lesions involving the ostium and mid section of the LCx. RCA with 25% distal stenosis. LVEF 55-60% with normal LVEDP.  Non-Invasive Evaluation(s):  Exercise MPI (04/07/2018): Interpreted as intermediate risk with possible apical anterior and apical ischemia and small inferolateral infarct. Normal LVEF. Greater than 1 mm ST depression at stress. I personally reviewed the images and believe that the apical defect represents artifact, as there is normal perfusion in this region on the non-attenuation corrected images. Only perfusion abnormality is a small apical inferior/lateral defect that may represent prior infarct, consistent with a low risk study.  Transthoracic echocardiogram (04/23/17): Normal LV size. LVEF 55-60% with anteroseptal hypokinesis (? related to prior cardiac surgery). Normal diastolic function. Borderline dilated aortic root (3.7 cm). Mild MR. Normal RV  size and function. Normal pulmonary artery pressure.  Carotid Doppler (09/23/16): Mild (1-39%) stenoses involving the left and right internal carotid arteries. Vertebral arteries with antegrade flow.  ABIs (09/23/16): Right 1.98 and the left 1.49 suggestive of medial calcification.  Transthoracic echocardiogram (09/19/16): Normal LV size with mildly reduced contraction (EF 45-50%) with basal and mid anterior and anteroseptal hypokinesis. Grade 1 diastolic dysfunction. Mildly dilated left atrium. Normal RV size and function.  Labs/Other Tests and Data Reviewed:    EKG:  No ECG reviewed.  Recent Labs: 08/18/2018: ALT 17; BUN 19; Creatinine, Ser 1.25; Potassium 4.7; Sodium 140   Recent Lipid Panel Lab Results  Component Value Date/Time   CHOL 103 08/18/2018 10:16 AM   TRIG 94 08/18/2018 10:16 AM   HDL 36 (L) 08/18/2018 10:16 AM   CHOLHDL 2.9 08/18/2018 10:16 AM   LDLCALC 48 08/18/2018 10:16 AM    Wt  Readings from Last 3 Encounters:  12/24/18 219 lb (99.3 kg)  08/18/18 223 lb (101.2 kg)  06/23/18 220 lb 4 oz (99.9 kg)     Objective:    Vital Signs:  BP 137/87 (BP Location: Left Arm, Patient Position: Sitting, Cuff Size: Normal)   Pulse 64   Ht _0  (1.93 m)   Wt 219 lb (99.3 kg)   BMI 26.66 kg/m    VITAL SIGNS:  reviewed  ASSESSMENT & PLAN:    Coronary artery disease: Mr. Clayson is doing well without signs or symptoms to suggest worsening coronary insuffiency.  Decreaesed exercise capacity that concerned him last year has resolved and he is back to baseline in regard to walking/biking.  We will continue his current medications for secondary prevention.  Ischemic cardiomyopathy: Mr. Mofield denies signs or symptoms of heart failure (NYHA class I).  We will not make any medication changes at this time.  He is not on a beta blocker due to bradycardia in the past.  If BP remains elevated, addition of an ACEI/ARB will need to be considered.  Hyperlipidemia: LDL at goal on last check in 08/2018.  We will continue atorvastatin 40 mg daily.  Hypertension: BP slightly elevated today (goal < 130/80), though Mr. Lipari states that it is typically lower.  I have asked him to check his BP once or twice a week and to let us know if it is consistently aboe 130/80.  No medication changes at this time.  Type 2 diabetes mellitus: Mr. Weatherall notes that his blood sugar has been trending up, most likely due to dietary indiscretion.  He was also switched from [place] to [place] due to insurance preference.  His out of pocket expense remains quite high.  Given his history of cardiac disease, it would be beneficial to remain on this class of medication, though it could be stopped and alternative diabetes medication used if it proves to be too costly.  Mr. Bacci should continue to follow with Dr. Caryn Section for ongoing management of his diabetes.  I encouraged him to limit his carbohydrate intake, as he has  done in the past.  COVID-19 Education: The signs and symptoms of COVID-19 were discussed with the patient and how to seek care for testing (follow up with PCP or arrange E-visit).  The importance of social distancing was discussed today.  I think it is reasonable for Mr. Mazzocco to begin playing golf if he is able to maintain social distancing standards.  Time:   Today, I have spent 20 minutes with the patient with telehealth technology discussing the above  problems.     Medication Adjustments/Labs and Tests Ordered: Current medicines are reviewed at length with the patient today.  Concerns regarding medicines are outlined above.   Tests Ordered: None.  Medication Changes: None.  Disposition:  Follow up in 6 month(s)  Signed, Nelva Bush, MD  12/24/2018 8:49 AM    [facility]

## 2018-12-24 ENCOUNTER — Telehealth (INDEPENDENT_AMBULATORY_CARE_PROVIDER_SITE_OTHER): Payer: Medicare Other | Admitting: Internal Medicine

## 2018-12-24 ENCOUNTER — Other Ambulatory Visit: Payer: Self-pay

## 2018-12-24 VITALS — BP 137/87 | HR 64 | Ht 76.0 in | Wt 219.0 lb

## 2018-12-24 DIAGNOSIS — I255 Ischemic cardiomyopathy: Secondary | ICD-10-CM

## 2018-12-24 DIAGNOSIS — I2510 Atherosclerotic heart disease of native coronary artery without angina pectoris: Secondary | ICD-10-CM | POA: Diagnosis not present

## 2018-12-24 DIAGNOSIS — I10 Essential (primary) hypertension: Secondary | ICD-10-CM | POA: Diagnosis not present

## 2018-12-24 DIAGNOSIS — E785 Hyperlipidemia, unspecified: Secondary | ICD-10-CM | POA: Diagnosis not present

## 2018-12-24 DIAGNOSIS — E118 Type 2 diabetes mellitus with unspecified complications: Secondary | ICD-10-CM

## 2018-12-24 NOTE — Patient Instructions (Signed)
Any Other Special Instructions Will Be Listed Below (If Applicable).  Dr End recommends you check your blood pressure 1-2 times a week. Please call us if blood pressure runs consistently greater than 130/80.    Medication Instructions:  Your physician recommends that you continue on your current medications as directed. Please refer to the Current Medication list given to you today.  If you need a refill on your cardiac medications before your next appointment, please call your pharmacy.   Lab work: none If you have labs (blood work) drawn today and your tests are completely normal, you will receive your results only by: Marland Kitchen MyChart Message (if you have MyChart) OR . A paper copy in the mail If you have any lab test that is abnormal or we need to change your treatment, we will call you to review the results.  Testing/Procedures: none  Follow-Up: At [facility], you and your health needs are our priority.  As part of our continuing mission to provide you with exceptional heart care, we have created designated Provider Care Teams.  These Care Teams include your primary Cardiologist (physician) and Advanced Practice Providers (APPs -  Physician Assistants and Nurse Practitioners) who all work together to provide you with the care you need, when you need it. You will need a follow up appointment in 6 months.  Please call our office 2 months in advance to schedule this appointment.  You may see Nelva Bush, MD or one of the following Advanced Practice Providers on your designated Care Team:   Murray Hodgkins, NP Christell Faith, PA-C . Marrianne Mood, PA-C

## 2019-01-05 ENCOUNTER — Telehealth: Payer: Self-pay

## 2019-01-05 NOTE — Telephone Encounter (Signed)
Patient is requesting COVID testing. He is asymptomatic. He is requesting the test because his health condition and his niece is a cancer survivor. He states he is unable to go visit her unless he is tested for COVID. Please advise. CB# [phone].

## 2019-01-06 ENCOUNTER — Telehealth: Payer: Self-pay | Admitting: *Deleted

## 2019-01-06 NOTE — Telephone Encounter (Signed)
Message sent to pec

## 2019-01-06 NOTE — Telephone Encounter (Signed)
Attempted to contact pt to schedule COVID testing per Dr [name]; pt is unable to talk; left message for pt to call back.

## 2019-01-19 ENCOUNTER — Ambulatory Visit: Payer: Medicare Other | Admitting: Family Medicine

## 2019-01-25 ENCOUNTER — Other Ambulatory Visit: Payer: Self-pay | Admitting: Family Medicine

## 2019-01-26 ENCOUNTER — Other Ambulatory Visit: Payer: Self-pay

## 2019-01-26 ENCOUNTER — Ambulatory Visit (INDEPENDENT_AMBULATORY_CARE_PROVIDER_SITE_OTHER): Payer: Medicare Other | Admitting: Family Medicine

## 2019-01-26 ENCOUNTER — Encounter: Payer: Self-pay | Admitting: Family Medicine

## 2019-01-26 VITALS — BP 126/72 | HR 64 | Temp 98.2°F | Resp 16 | Wt 224.0 lb

## 2019-01-26 DIAGNOSIS — I10 Essential (primary) hypertension: Secondary | ICD-10-CM | POA: Diagnosis not present

## 2019-01-26 DIAGNOSIS — E118 Type 2 diabetes mellitus with unspecified complications: Secondary | ICD-10-CM

## 2019-01-26 DIAGNOSIS — G629 Polyneuropathy, unspecified: Secondary | ICD-10-CM

## 2019-01-26 LAB — POCT GLYCOSYLATED HEMOGLOBIN (HGB A1C): Hemoglobin A1C: 7.3 % — AB (ref 4.0–5.6)

## 2019-01-26 LAB — POCT UA - MICROALBUMIN: Microalbumin Ur, POC: 20 mg/L

## 2019-01-26 MED ORDER — GABAPENTIN 300 MG PO CAPS: 300.0000 mg | ORAL_CAPSULE | Freq: Every evening | ORAL | 0 refills | Status: DC

## 2019-01-26 NOTE — Progress Notes (Signed)
Patient: Stuart Henry Male    DOB: [birth year]   78 y.o.   MRN: [id number] Visit Date: 01/26/2019  Today's Provider: [name], MD   Chief Complaint  Patient presents with  . Hypertension  . Diabetes   Subjective:     HPI    Diabetes Mellitus Type II, Follow-up:   Lab Results  Component Value Date   HGBA1C 7.1 (A) 03/19/2018   HGBA1C 6.6 11/16/2017   HGBA1C 6.4 (H) 08/17/2017   Last seen for diabetes 6 months ago.  Management since then includes no changes. He reports excellent compliance with treatment. He is not having side effects.  Current symptoms include Numbness and tingling and have been stable. Home blood sugar records: fasting range: 140-145's  Episodes of hypoglycemia? no   Current Insulin Regimen: None Most Recent Eye Exam: UTD Weight trend: stable Prior visit with dietician: yes -  Current diet: in general, a "healthy" diet   Current exercise: regularly  Is still exercising several days a week, playing golf, walking, and biking, but has been eating more and noticed sugars running a little higher.  ------------------------------------------------------------------------   Hypertension, follow-up:  BP Readings from Last 3 Encounters:  01/26/19 126/72  12/24/18 137/87  08/18/18 (!) 150/88    He was last seen for hypertension 6 months ago.  BP at that visit was 120/60. Management since that visit includes No changes He reports excellent compliance with treatment. He is not having side effects.  He is exercising. He is adherent to low salt diet.   Outside blood pressures are 130's/80's. He is experiencing none.  Patient denies chest pain, fatigue, near-syncope, palpitations and syncope.   Cardiovascular risk factors include advanced age (older than 73 for men, 79 for women), diabetes mellitus, hypertension and male gender.  Use of agents associated with hypertension: none.    ------------------------------------------------------------------------    Lipid/Cholesterol, Follow-up:   Last seen for this 6 months ago.  Management since that visit includes no changes.  Last Lipid Panel:    Component Value Date/Time   CHOL 103 08/18/2018 1016   TRIG 94 08/18/2018 1016   HDL 36 (L) 08/18/2018 1016   CHOLHDL 2.9 08/18/2018 1016   LDLCALC 48 08/18/2018 1016    He reports excellent compliance with treatment. He is not having side effects.   Wt Readings from Last 3 Encounters:  01/26/19 224 lb (101.6 kg)  12/24/18 219 lb (99.3 kg)  08/18/18 223 lb (101.2 kg)    ------------------------------------------------------------------------     Allergies  Allergen Reactions  . No Known Allergies      Current Outpatient Medications:  .  ACCU-CHEK AVIVA PLUS test strip, CHECK BLOOD SUGAR ONCE  DAILY, Disp: 100 each, Rfl: 4 .  allopurinol (ZYLOPRIM) 100 MG tablet, TAKE 1 TABLET DAILY, Disp: 90 tablet, Rfl: 4 .  aspirin EC 81 MG tablet, Take 1 tablet (81 mg total) by mouth daily., Disp: 90 tablet, Rfl: 3 .  atorvastatin (LIPITOR) 40 MG tablet, TAKE 1 TABLET BY MOUTH AT BEDTIME, Disp: 90 tablet, Rfl: 4 .  Blood Glucose Monitoring Suppl (ACCU-CHEK AVIVA PLUS) w/Device KIT, USE TO CHECK BLOOD SUGAR  DAILY AS DIRECTED, Disp: 1 kit, Rfl: 0 .  dapagliflozin propanediol (FARXIGA) 5 MG TABS tablet, Take 5 mg by mouth daily. TAKE IN THE PLACE OF JARDIANCE, Disp: 90 tablet, Rfl: 4 .  gabapentin (NEURONTIN) 300 MG capsule, TAKE 1 CAPSULE BY MOUTH AT BEDTIME FOR  NEUROPATHY, Disp: 90 capsule, Rfl: 4 .  glipiZIDE (GLUCOTROL) 10 MG tablet, Take 10 mg by mouth daily before breakfast., Disp: , Rfl:  .  ketoconazole (NIZORAL) 2 % shampoo, USE ON SCALP TWICE WEEKLY, Disp: 120 mL, Rfl: 5 .  metFORMIN (GLUCOPHAGE) 1000 MG tablet, TAKE 1 TABLET TWICE A DAY WITH A MEAL, Disp: 180 tablet, Rfl: 3 .  nitroGLYCERIN (NITROSTAT) 0.4 MG SL tablet, Place 1 tablet (0.4 mg total) under the  tongue every 5 (five) minutes as needed. Maximum of 3 doses., Disp: 25 tablet, Rfl: 5 .  pioglitazone (ACTOS) 30 MG tablet, TAKE 1 TABLET BY MOUTH ONCE DAILY, Disp: 90 tablet, Rfl: 4  Review of Systems  Constitutional: Negative.   Respiratory: Negative.   Cardiovascular: Negative.   Gastrointestinal: Negative.   Endocrine: Negative.   Neurological: Negative for dizziness, light-headedness and headaches.    Social History   Tobacco Use  . Smoking status: Former Smoker    Packs/day: 1.00    Years: 5.00    Pack years: 5.00    Types: Cigarettes    Quit date: 08/06/1972    Years since quitting: 46.5  . Smokeless tobacco: Never Used  Substance Use Topics  . Alcohol use: No    Alcohol/week: 0.0 standard drinks    Comment: 09/26/2016 "quit 01/30/12"      Objective:   BP 126/72 (BP Location: Right Arm, Patient Position: Sitting, Cuff Size: Large)   Pulse 64   Temp 98.2 F (36.8 C) (Oral)   Resp 16   Wt 224 lb (101.6 kg)   BMI 27.27 kg/m  Vitals:   01/26/19 0958  BP: 126/72  Pulse: 64  Resp: 16  Temp: 98.2 F (36.8 C)  TempSrc: Oral  Weight: 224 lb (101.6 kg)     Physical Exam   General Appearance:    Alert, cooperative, no distress  Eyes:    PERRL, conjunctiva/corneas clear, EOM's intact       Lungs:     Clear to auscultation bilaterally, respirations unlabored  Heart:    Regular rate and rhythm  Neurologic:   Awake, alert, oriented x 3. No apparent focal neurological           defect.        Results for orders placed or performed in visit on 01/26/19  POCT glycosylated hemoglobin (Hb A1C)  Result Value Ref Range   Hemoglobin A1C 7.3 (A) 4.0 - 5.6 %  POCT UA - Microalbumin  Result Value Ref Range   Microalbumin Ur, POC 20 mg/L       Assessment & Plan    1. Essential hypertension Well controlled.  Continue current medications.    2. Type 2 diabetes mellitus with complication, without long-term current use of insulin ([place]) Near goal, but a1c is rising.  Continue current medications.  Work on [employer] and continue regular exercise routine.   3. Polyneuropathy Is having more trouble with feet burning in the evening. Is only taking one gabapentin at bedtime. Advised he could take an additional capsule early in the evening if he needs to.  - gabapentin (NEURONTIN) 300 MG capsule; Take 1-2 capsules (300-600 mg total) by mouth every evening.  Dispense: 3 capsule; Refill: 0  Future Appointments  Date Time Provider [place]  05/25/2019  8:00 AM Fisher, Kirstie Peri, MD [facility] None       [name], MD  [facility]

## 2019-01-26 NOTE — Patient Instructions (Signed)
.   Please review the attached list of medications and notify my office if there are any errors.   . Please bring all of your medications to every appointment so we can make sure that our medication list is the same as yours.   

## 2019-01-30 ENCOUNTER — Other Ambulatory Visit: Payer: Self-pay | Admitting: Family Medicine

## 2019-01-30 DIAGNOSIS — E1165 Type 2 diabetes mellitus with hyperglycemia: Secondary | ICD-10-CM

## 2019-01-30 DIAGNOSIS — IMO0002 Reserved for concepts with insufficient information to code with codable children: Secondary | ICD-10-CM

## 2019-04-20 ENCOUNTER — Other Ambulatory Visit: Payer: Self-pay | Admitting: Family Medicine

## 2019-05-06 ENCOUNTER — Encounter: Payer: Self-pay | Admitting: Family Medicine

## 2019-05-25 ENCOUNTER — Ambulatory Visit: Payer: Medicare Other | Admitting: Family Medicine

## 2019-05-27 ENCOUNTER — Ambulatory Visit: Payer: Medicare Other | Admitting: Family Medicine

## 2019-05-27 ENCOUNTER — Encounter: Payer: Self-pay | Admitting: Family Medicine

## 2019-05-27 ENCOUNTER — Other Ambulatory Visit: Payer: Self-pay

## 2019-05-27 VITALS — BP 144/80 | HR 61 | Temp 98.2°F | Wt 230.6 lb

## 2019-05-27 DIAGNOSIS — I10 Essential (primary) hypertension: Secondary | ICD-10-CM

## 2019-05-27 DIAGNOSIS — E1140 Type 2 diabetes mellitus with diabetic neuropathy, unspecified: Secondary | ICD-10-CM | POA: Diagnosis not present

## 2019-05-27 LAB — POCT GLYCOSYLATED HEMOGLOBIN (HGB A1C): Hemoglobin A1C: 6.9 % — AB (ref 4.0–5.6)

## 2019-05-27 NOTE — Progress Notes (Signed)
Patient: Stuart Henry Male    DOB: February 16, 1941   78 y.o.   MRN: [id number] Visit Date: 05/27/2019  Today's Provider: [name], MD   Chief Complaint  Patient presents with  . Hypertension  . Diabetes  . Hyperlipidemia   Subjective:     HPI Diabetes Mellitus Type II, Follow Up:  Lab Results  Component Value Date   HGBA1C 6.9 (A) 05/27/2019   Last seen for diabetes 4 months ago.  Management since then includes no changes. He reports excellent compliance with treatment. He is not having side effects.  Current symptoms include  Home blood sugar records: fasting range: 140-150's  Episodes of hypoglycemia? no              Current Insulin Regimen: None Most Recent Eye Exam: UTD Weight trend: stable Prior visit with dietician: yes  Current diet: in general, a "healthy" diet   Current exercise: regularly  ------------------------------------------------------------------------   Hypertension, follow-up:     BP Readings from Last 3 Encounters:  01/26/19 126/72  12/24/18 137/87  08/18/18 (!) 150/88    He was last seen for hypertension 4 months ago.  BP at that visit was 126/72. Management since that visit includes no changes He reports excellent compliance with treatment. He is not having side effects.  He is exercising. He is adherent to low salt diet.   Outside blood pressures are being checked periodically He is experiencing none.  Patient denies chest pain, fatigue, near-syncope, palpitations and syncope.   Cardiovascular risk factors include advanced age (older than 79 for men, 32 for women), diabetes mellitus, hypertension and male gender.  Use of agents associated with hypertension: none.   ------------------------------------------------------------------------   Lipid/Cholesterol, Follow-up:   Last seen for this 4 months ago.  Management since that visit includes no changes.  Last Lipid Panel: Lab Results  Component  Value Date   CHOL 103 08/18/2018   HDL 36 (L) 08/18/2018   LDLCALC 48 08/18/2018   TRIG 94 08/18/2018   CHOLHDL 2.9 08/18/2018   He reports excellent compliance with treatment. He is not having side effects.      Wt Readings from Last 3 Encounters:  01/26/19 224 lb (101.6 kg)  12/24/18 219 lb (99.3 kg)  08/18/18 223 lb (101.2 kg)    ------------------------------------------------------------------------  Allergies  Allergen Reactions  . No Known Allergies      Current Outpatient Medications:  .  ACCU-CHEK AVIVA PLUS test strip, CHECK BLOOD SUGAR ONCE  DAILY, Disp: 100 each, Rfl: 4 .  allopurinol (ZYLOPRIM) 100 MG tablet, TAKE 1 TABLET DAILY, Disp: 90 tablet, Rfl: 3 .  aspirin EC 81 MG tablet, Take 1 tablet (81 mg total) by mouth daily., Disp: 90 tablet, Rfl: 3 .  atorvastatin (LIPITOR) 40 MG tablet, TAKE 1 TABLET BY MOUTH AT BEDTIME, Disp: 90 tablet, Rfl: 4 .  Blood Glucose Monitoring Suppl (ACCU-CHEK AVIVA PLUS) w/Device KIT, USE TO CHECK BLOOD SUGAR  DAILY AS DIRECTED, Disp: 1 kit, Rfl: 0 .  dapagliflozin propanediol (FARXIGA) 5 MG TABS tablet, Take 5 mg by mouth daily. TAKE IN THE PLACE OF JARDIANCE, Disp: 90 tablet, Rfl: 4 .  gabapentin (NEURONTIN) 300 MG capsule, Take 1-2 capsules (300-600 mg total) by mouth every evening., Disp: 3 capsule, Rfl: 0 .  glipiZIDE (GLUCOTROL XL) 10 MG 24 hr tablet, TAKE 1 TABLET DAILY WITH BREAKFAST, Disp: 90 tablet, Rfl: 4 .  ketoconazole (NIZORAL) 2 % shampoo, USE ON SCALP TWICE WEEKLY, Disp:  120 mL, Rfl: 5 .  metFORMIN (GLUCOPHAGE) 1000 MG tablet, TAKE 1 TABLET TWICE A DAY WITH A MEAL, Disp: 180 tablet, Rfl: 3 .  nitroGLYCERIN (NITROSTAT) 0.4 MG SL tablet, Place 1 tablet (0.4 mg total) under the tongue every 5 (five) minutes as needed. Maximum of 3 doses., Disp: 25 tablet, Rfl: 5 .  pioglitazone (ACTOS) 30 MG tablet, TAKE 1 TABLET BY MOUTH ONCE DAILY, Disp: 90 tablet, Rfl: 4  Review of Systems  Constitutional: Negative for appetite  change, chills and fever.  Respiratory: Negative for chest tightness, shortness of breath and wheezing.   Cardiovascular: Negative for chest pain and palpitations.  Gastrointestinal: Negative for abdominal pain, nausea and vomiting.    Social History   Tobacco Use  . Smoking status: Former Smoker    Packs/day: 1.00    Years: 5.00    Pack years: 5.00    Types: Cigarettes    Quit date: 08/06/1972    Years since quitting: 46.8  . Smokeless tobacco: Never Used  Substance Use Topics  . Alcohol use: No    Alcohol/week: 0.0 standard drinks    Comment: 09/26/2016 "quit 01/30/12"      Objective:    Vitals:   05/27/19 0817 05/27/19 0851  BP: (!) 145/77 (!) 144/80  Pulse: 61   Temp: 98.2 F (36.8 C)   TempSrc: Temporal   Weight: 230 lb 9.6 oz (104.6 kg)   Body mass index is 28.07 kg/m.   Physical Exam  General appearance: Overweight male, cooperative and in no acute distress Head: Normocephalic, without obvious abnormality, atraumatic Respiratory: Respirations even and unlabored, normal respiratory rate Extremities: All extremities are intact.  Skin: Skin color, texture, turgor normal. No rashes seen  Psych: Appropriate mood and affect. Neurologic: Mental status: Alert, oriented to person, place, and time, thought content appropriate.  Results for orders placed or performed in visit on 05/27/19  POCT HgB A1C  Result Value Ref Range   Hemoglobin A1C 6.9 (A) 4.0 - 5.6 %       Assessment & Plan    1. Controlled type 2 diabetes with neuropathy ([place]) Well controlled.  gabapentin remains effective for neuropathy. Continue current medications.  Follow up 4 months   2. Essential hypertension BP elevated today, but usually well controlled, continue current medications for now. Work on avoiding sodium in diet.  .  The entirety of the information documented in the History of Present Illness, Review of Systems and Physical Exam were personally obtained by me. Portions of this  information were initially documented by Idelle Jo, CMA and reviewed by me for thoroughness and accuracy.     [name], MD  [facility]

## 2019-05-27 NOTE — Patient Instructions (Signed)
.   Please review the attached list of medications and notify my office if there are any errors.   . Please bring all of your medications to every appointment so we can make sure that our medication list is the same as yours.   . It is especially important to get the annual flu vaccine this year. If you haven't had it already, please go to your pharmacy or call the office as soon as possible to schedule you flu shot.  

## 2019-06-01 ENCOUNTER — Other Ambulatory Visit: Payer: Self-pay | Admitting: Family Medicine

## 2019-06-01 DIAGNOSIS — L219 Seborrheic dermatitis, unspecified: Secondary | ICD-10-CM

## 2019-06-09 ENCOUNTER — Other Ambulatory Visit: Payer: Self-pay | Admitting: Family Medicine

## 2019-06-27 ENCOUNTER — Other Ambulatory Visit: Payer: Self-pay | Admitting: Family Medicine

## 2019-06-27 DIAGNOSIS — E118 Type 2 diabetes mellitus with unspecified complications: Secondary | ICD-10-CM

## 2019-07-04 ENCOUNTER — Other Ambulatory Visit: Payer: Self-pay | Admitting: Family Medicine

## 2019-07-20 ENCOUNTER — Ambulatory Visit (INDEPENDENT_AMBULATORY_CARE_PROVIDER_SITE_OTHER): Payer: Medicare Other | Admitting: Internal Medicine

## 2019-07-20 ENCOUNTER — Other Ambulatory Visit: Payer: Self-pay

## 2019-07-20 ENCOUNTER — Encounter: Payer: Self-pay | Admitting: Internal Medicine

## 2019-07-20 VITALS — BP 138/80 | HR 68 | Ht 76.0 in | Wt 234.0 lb

## 2019-07-20 DIAGNOSIS — E785 Hyperlipidemia, unspecified: Secondary | ICD-10-CM | POA: Diagnosis not present

## 2019-07-20 DIAGNOSIS — I2510 Atherosclerotic heart disease of native coronary artery without angina pectoris: Secondary | ICD-10-CM | POA: Diagnosis not present

## 2019-07-20 DIAGNOSIS — I255 Ischemic cardiomyopathy: Secondary | ICD-10-CM | POA: Diagnosis not present

## 2019-07-20 DIAGNOSIS — I10 Essential (primary) hypertension: Secondary | ICD-10-CM | POA: Diagnosis not present

## 2019-07-20 MED ORDER — LISINOPRIL 5 MG PO TABS: 5.0000 mg | ORAL_TABLET | Freq: Every day | ORAL | 3 refills | Status: DC

## 2019-07-20 MED ORDER — NITROGLYCERIN 0.4 MG SL SUBL: 0.4000 mg | SUBLINGUAL_TABLET | SUBLINGUAL | 5 refills | Status: DC | PRN

## 2019-07-20 NOTE — Patient Instructions (Signed)
Medication Instructions:  Your physician has recommended you make the following change in your medication:  1- START Lisinopril 5 mg by mouth once a day. 2- DO NOT TAKE Lipitor for 1 month to see if weakness improves.   *If you need a refill on your cardiac medications before your next appointment, please call your pharmacy*  Lab Work: 1- Your physician recommends that you return for lab work in: [place].  2- Your physician recommends that you return for lab work in: [place] August 19, 2019. - CBC, CMP, FASTING LIPID - Please go to the [facility] You will check in at the front desk to the right as you walk into the atrium. Valet Parking is offered if needed. - No appointment needed. You may go any day between 7 am and 6 pm. - You will need to be fasting. Please do not have anything to eat or drink after midnight the morning you have the lab work. You may only have water or black coffee with no cream or sugar.  If you have labs (blood work) drawn today and your tests are completely normal, you will receive your results only by: Marland Kitchen MyChart Message (if you have MyChart) OR . A paper copy in the mail If you have any lab test that is abnormal or we need to change your treatment, we will call you to review the results.  Testing/Procedures: none  Follow-Up: At [facility], you and your health needs are our priority.  As part of our continuing mission to provide you with exceptional heart care, we have created designated Provider Care Teams.  These Care Teams include your primary Cardiologist (physician) and Advanced Practice Providers (APPs -  Physician Assistants and Nurse Practitioners) who all work together to provide you with the care you need, when you need it.  Your next appointment:   6 month(s)  The format for your next appointment:   In Person  Provider:    You may see Nelva Bush, MD or one of the following Advanced Practice Providers on your  designated Care Team:    Murray Hodgkins, NP  Christell Faith, PA-C  Marrianne Mood, PA-C

## 2019-07-20 NOTE — Progress Notes (Signed)
Follow-up Outpatient Visit Date: 07/20/2019  Primary Care Provider: Birdie Sons, MD [address] [place] [address]  Chief Complaint: Follow-up CAD  HPI:  Stuart Henry is a 78 y.o. male with history of coronary artery disease status post CABG (02/7115) complicated by postoperative atrial fibrillation, ischemic cardiomyopathy(LVEF normal by echo in 04/2017), type 2 diabetes mellitus, and hypertension, who presents for follow-up of CAD.  Last spoke in May, which time Mr. Maiden was doing well without chest pain, dyspnea, palpitations, lightheadedness, and edema.  He noted that his blood sugars have been trending up, which she attributed to increasing more carbohydrates.  Blood pressure was mildly elevated at that time.  I encouraged lifestyle modification.  We did not make any medication changes at this time.  Today, Mr. Detter reports that he has been doing relatively well.  He does feel like his legs are not as strong as they used to be.  He has continued to try to bike and walk on a regular basis, though it has been a less frequent than usual over the last few months.  He denies chest pain, shortness of breath, palpitations, lightheadedness, and edema.  He has been eating more bread and other carbohydrates and is concerned that dietary indiscretion has contributed to his weight gain and general upward trend in blood pressure since the summer.  He is also drinking more wine.  --------------------------------------------------------------------------------------------------  Cardiovascular History & Procedures: Cardiovascular Problems:  Coronary artery disease status post CABG (09/2016)  Ischemic cardiomyopathy  Postoperative atrial fibrillation  Risk Factors:  Known coronary artery disease, diabetes mellitus, hypertension, age greater than 40, and male gender  Cath/PCI:  LHC (09/16/16): Significant two-vessel CAD with 95% proximal LAD stenosis involving ostium of  D1, 90% ostial ramus intermedius stenosis, and at least 50% lesions involving the ostium and mid section of the LCx. RCA with 25% distal stenosis. LVEF 55-60% with normal LVEDP.  CV Surgery:  CABG (09/24/16, Dr. Roxan Hockey): LIMA to LAD, SVG to D1, and sequential SVG to first and second branches of ramus intermedius.  EP Procedures and Devices:  None  Non-Invasive Evaluation(s):  Exercise MPI (04/07/2018): Interpreted as intermediate risk with possible apical anterior and apical ischemia and small inferolateral infarct. Normal LVEF. Greater than 1 mm ST depression at stress. I personally reviewed the images and believe that the apical defect represents artifact, as there is normal perfusion in this region on the non-attenuation corrected images. Only perfusion abnormality is a small apical inferior/lateral defect that may represent prior infarct, consistent with a low risk study.  Transthoracic echocardiogram (04/23/17): Normal LV size. LVEF 55-60% with anteroseptal hypokinesis (? related to prior cardiac surgery). Normal diastolic function. Borderline dilated aortic root (3.7 cm). Mild MR. Normal RV size and function. Normal pulmonary artery pressure.  Carotid Doppler (09/23/16): Mild (1-39%) stenoses involving the left and right internal carotid arteries. Vertebral arteries with antegrade flow.  ABIs (09/23/16): Right 1.98 and the left 1.49 suggestive of medial calcification.  Transthoracic echocardiogram (09/19/16): Normal LV size with mildly reduced contraction (EF 45-50%) with basal and mid anterior and anteroseptal hypokinesis. Grade 1 diastolic dysfunction. Mildly dilated left atrium. Normal RV size and function.  Recent CV Pertinent Labs: Lab Results  Component Value Date   CHOL 103 08/18/2018   HDL 36 (L) 08/18/2018   LDLCALC 48 08/18/2018   TRIG 94 08/18/2018   CHOLHDL 2.9 08/18/2018   INR 1.17 09/29/2016   BNP 60.0 11/16/2017   K 4.4 07/20/2019   MG  1.9 09/25/2016   BUN  22 07/20/2019   CREATININE 1.19 07/20/2019    Past medical and surgical history were reviewed and updated in EPIC.  Current Meds  Medication Sig  . ACCU-CHEK AVIVA PLUS test strip CHECK BLOOD SUGAR ONCE  DAILY  . allopurinol (ZYLOPRIM) 100 MG tablet TAKE 1 TABLET DAILY  . aspirin EC 81 MG tablet Take 1 tablet (81 mg total) by mouth daily.  Marland Kitchen atorvastatin (LIPITOR) 40 MG tablet TAKE 1 TABLET BY MOUTH AT BEDTIME  . Blood Glucose Monitoring Suppl (ACCU-CHEK AVIVA PLUS) w/Device KIT USE TO CHECK BLOOD SUGAR  DAILY AS DIRECTED  . dapagliflozin propanediol (FARXIGA) 5 MG TABS tablet Take 5 mg by mouth daily. TAKE IN THE PLACE OF JARDIANCE  . gabapentin (NEURONTIN) 300 MG capsule Take 1-2 capsules (300-600 mg total) by mouth every evening.  Marland Kitchen glipiZIDE (GLUCOTROL XL) 10 MG 24 hr tablet TAKE 1 TABLET DAILY WITH BREAKFAST  . ketoconazole (NIZORAL) 2 % shampoo USE ON SCALP TWICE WEEKLY  . metFORMIN (GLUCOPHAGE) 1000 MG tablet TAKE 1 TABLET TWICE A DAY WITH A MEAL  . nitroGLYCERIN (NITROSTAT) 0.4 MG SL tablet Place 1 tablet (0.4 mg total) under the tongue every 5 (five) minutes as needed. Maximum of 3 doses.  . pioglitazone (ACTOS) 30 MG tablet Take 1 tablet by mouth once daily  . [DISCONTINUED] nitroGLYCERIN (NITROSTAT) 0.4 MG SL tablet Place 1 tablet (0.4 mg total) under the tongue every 5 (five) minutes as needed. Maximum of 3 doses.    Allergies: No known allergies  Social History   Tobacco Use  . Smoking status: Former Smoker    Packs/day: 1.00    Years: 5.00    Pack years: 5.00    Types: Cigarettes    Quit date: 08/06/1972    Years since quitting: 46.9  . Smokeless tobacco: Never Used  Substance Use Topics  . Alcohol use: No    Alcohol/week: 0.0 standard drinks    Comment: 09/26/2016 "quit 01/30/12"  . Drug use: No    Family History  Problem Relation Age of Onset  . Dementia Mother   . Diabetes Mother   . Cancer Father   . Diabetes Sister   . Diabetes Brother   . Heart  disease Other     Review of Systems: A 12-system review of systems was performed and was negative except as noted in the HPI.  --------------------------------------------------------------------------------------------------  Physical Exam: BP 138/80 (BP Location: Left Arm, Patient Position: Sitting, Cuff Size: Normal)   Pulse 68   Ht '6\' 4"'  (1.93 m)   Wt 234 lb (106.1 kg)   SpO2 98%   BMI 28.48 kg/m   General: NAD. HEENT: No conjunctival pallor or scleral icterus. Facemask in place. Neck: Supple without lymphadenopathy, thyromegaly, JVD, or HJR. Lungs: Normal work of breathing. Clear to auscultation bilaterally without wheezes or crackles. Heart: Regular rate and rhythm without murmurs, rubs, or gallops. Non-displaced PMI. Abd: Bowel sounds present. Soft, NT/ND without hepatosplenomegaly Ext: No lower extremity edema. Skin: Warm and dry without rash.  EKG: Normal sinus rhythm with PACs, inferior Q waves, and poor R wave progression.  No significant change since 06/23/2018.  Lab Results  Component Value Date   WBC 4.6 11/16/2017   HGB 13.9 11/16/2017   HCT 42.3 11/16/2017   MCV 92 11/16/2017   PLT 183 11/16/2017    Lab Results  Component Value Date   NA 142 07/20/2019   K 4.4 07/20/2019   CL 104 07/20/2019  CO2 24 07/20/2019   BUN 22 07/20/2019   CREATININE 1.19 07/20/2019   GLUCOSE 138 (H) 07/20/2019   ALT 17 08/18/2018    Lab Results  Component Value Date   CHOL 103 08/18/2018   HDL 36 (L) 08/18/2018   LDLCALC 48 08/18/2018   TRIG 94 08/18/2018   CHOLHDL 2.9 08/18/2018    --------------------------------------------------------------------------------------------------  ASSESSMENT AND PLAN: Coronary artery disease without angina: Mr. Seeling continues to do well status post CABG.  Given some leg fatigue, we will move forward with a 1 month statin holiday to see if that improves his symptoms.  Otherwise, we will continue his current medications for  secondary prevention.  We will plan to check CBC, CMP, and lipid panel in about a month.  Ischemic cardiomyopathy: Mr. Festa appears euvolemic with NYHA class II symptoms.  Given elevated blood pressure, we will add lisinopril today.  He previously had bradycardia with even low-dose beta-blockers.  Hypertension: Blood pressure mildly elevated today and also trending up at home.  We discussed the importance of sodium restriction as well as reduction in alcohol consumption.  We have agreed to restart lisinopril at 5 mg daily.  I will check a BMP today and follow-up labs in 2 to 4 weeks.  Hyperlipidemia: Statin holiday, as outlined above.  If leg fatigue/weakness improves, we will need to consider trial of an alternative statin.  If he does not have any significant improvement, he should restart atorvastatin in about a month.  Follow-up: Return to clinic in 6 months.  Nelva Bush, MD 07/22/2019 7:23 AM

## 2019-07-21 LAB — BASIC METABOLIC PANEL
BUN/Creatinine Ratio: 18 (ref 10–24)
BUN: 22 mg/dL (ref 8–27)
CO2: 24 mmol/L (ref 20–29)
Calcium: 9.3 mg/dL (ref 8.6–10.2)
Chloride: 104 mmol/L (ref 96–106)
Creatinine, Ser: 1.19 mg/dL (ref 0.76–1.27)
GFR calc Af Amer: 68 mL/min/{1.73_m2} (ref >59)
GFR calc non Af Amer: 59 mL/min/{1.73_m2} — ABNORMAL LOW (ref >59)
Glucose: 138 mg/dL — ABNORMAL HIGH (ref 65–99)
Potassium: 4.4 mmol/L (ref 3.5–5.2)
Sodium: 142 mmol/L (ref 134–144)

## 2019-07-26 ENCOUNTER — Ambulatory Visit: Payer: Medicare Other | Attending: Internal Medicine

## 2019-07-26 DIAGNOSIS — Z20822 Contact with and (suspected) exposure to covid-19: Secondary | ICD-10-CM

## 2019-07-28 LAB — NOVEL CORONAVIRUS, NAA: SARS-CoV-2, NAA: NOT DETECTED

## 2019-07-30 ENCOUNTER — Telehealth: Payer: Self-pay | Admitting: Family Medicine

## 2019-07-30 NOTE — Telephone Encounter (Signed)
Negative COVID results given. Patient results "NOT Detected." Caller expressed understanding. ° °

## 2019-08-04 ENCOUNTER — Other Ambulatory Visit
Admission: RE | Admit: 2019-08-04 | Discharge: 2019-08-04 | Disposition: A | Discharge status: Home / Self Care | Payer: Medicare Other | Attending: Internal Medicine | Admitting: Internal Medicine

## 2019-08-04 DIAGNOSIS — I255 Ischemic cardiomyopathy: Secondary | ICD-10-CM | POA: Diagnosis present

## 2019-08-04 DIAGNOSIS — I2510 Atherosclerotic heart disease of native coronary artery without angina pectoris: Secondary | ICD-10-CM

## 2019-08-04 DIAGNOSIS — E785 Hyperlipidemia, unspecified: Secondary | ICD-10-CM | POA: Insufficient documentation

## 2019-08-04 LAB — COMPREHENSIVE METABOLIC PANEL
ALT: 20 U/L (ref 0–44)
AST: 19 U/L (ref 15–41)
Albumin: 4.5 g/dL (ref 3.5–5.0)
Alkaline Phosphatase: 38 U/L (ref 38–126)
Anion gap: 10 (ref 5–15)
BUN: 23 mg/dL (ref 8–23)
CO2: 25 mmol/L (ref 22–32)
Calcium: 9 mg/dL (ref 8.9–10.3)
Chloride: 103 mmol/L (ref 98–111)
Creatinine, Ser: 1.13 mg/dL (ref 0.61–1.24)
GFR calc Af Amer: >60 mL/min (ref >60)
GFR calc non Af Amer: >60 mL/min (ref >60)
Glucose, Bld: 176 mg/dL — ABNORMAL HIGH (ref 70–99)
Potassium: 5 mmol/L (ref 3.5–5.1)
Sodium: 138 mmol/L (ref 135–145)
Total Bilirubin: 1 mg/dL (ref 0.3–1.2)
Total Protein: 7.4 g/dL (ref 6.5–8.1)

## 2019-08-04 LAB — LIPID PANEL
Cholesterol: 209 mg/dL — ABNORMAL HIGH (ref 0–200)
HDL: 47 mg/dL (ref >40)
LDL Cholesterol: 148 mg/dL — ABNORMAL HIGH (ref 0–99)
Total CHOL/HDL Ratio: 4.4 RATIO
Triglycerides: 70 mg/dL (ref <150)
VLDL: 14 mg/dL (ref 0–40)

## 2019-08-04 LAB — CBC WITH DIFFERENTIAL/PLATELET
Abs Immature Granulocytes: 0.01 10*3/uL (ref 0.00–0.07)
Basophils Absolute: 0 10*3/uL (ref 0.0–0.1)
Basophils Relative: 1 %
Eosinophils Absolute: 0.1 10*3/uL (ref 0.0–0.5)
Eosinophils Relative: 3 %
HCT: 44.5 % (ref 39.0–52.0)
Hemoglobin: 14.6 g/dL (ref 13.0–17.0)
Immature Granulocytes: 0 %
Lymphocytes Relative: 18 %
Lymphs Abs: 0.7 10*3/uL (ref 0.7–4.0)
MCH: 31 pg (ref 26.0–34.0)
MCHC: 32.8 g/dL (ref 30.0–36.0)
MCV: 94.5 fL (ref 80.0–100.0)
Monocytes Absolute: 0.3 10*3/uL (ref 0.1–1.0)
Monocytes Relative: 9 %
Neutro Abs: 2.6 10*3/uL (ref 1.7–7.7)
Neutrophils Relative %: 69 %
Platelets: 186 10*3/uL (ref 150–400)
RBC: 4.71 MIL/uL (ref 4.22–5.81)
RDW: 12.7 % (ref 11.5–15.5)
WBC: 3.8 10*3/uL — ABNORMAL LOW (ref 4.0–10.5)
nRBC: 0 % (ref 0.0–0.2)

## 2019-08-09 ENCOUNTER — Telehealth: Payer: Self-pay | Admitting: *Deleted

## 2019-08-09 DIAGNOSIS — E785 Hyperlipidemia, unspecified: Secondary | ICD-10-CM

## 2019-08-09 DIAGNOSIS — Z79899 Other long term (current) drug therapy: Secondary | ICD-10-CM

## 2019-08-09 MED ORDER — ROSUVASTATIN CALCIUM 10 MG PO TABS: 10.0000 mg | ORAL_TABLET | Freq: Every day | ORAL | 3 refills | Status: DC

## 2019-08-09 NOTE — Telephone Encounter (Signed)
I recommend starting rosuvastatin 10 mg daily with lipid panel and ALT in ~3 months.  Thanks.

## 2019-08-09 NOTE — Telephone Encounter (Signed)
No answer. Left message to call back.   

## 2019-08-09 NOTE — Telephone Encounter (Signed)
-----   Message from Nelva Bush, MD sent at 08/04/2019 11:53 AM EST ----- Please let Mr. Hearn know that his kidney function, liver function, and electrolytes are stable.  His cholesterol has risen, which is not surprising given statin holiday.  If his leg fatigue/weakness has improved, he should let us know so that we can consider trying rosuvastatin or non-statin therapy.  If his legs feel about the same, he should restart atorvastatin 40 mg daily with plans for repeal lipid panel and ALT in ~3 months.

## 2019-08-09 NOTE — Telephone Encounter (Signed)
Results called to pt. Pt verbalized understanding. Patient states he has seen improvement with being off the atorvastatin and his legs feel less weak. Advised I will make Dr End aware for further recommendations on lipid therapy.

## 2019-08-09 NOTE — Telephone Encounter (Signed)
Patient calling back. Verbalized understanding to start rosuvastatin 10 mg daily and repeat fasting lab work at [facility] in April. Lab work orders entered and Rx sent to pharmacy.

## 2019-08-19 ENCOUNTER — Other Ambulatory Visit: Payer: Self-pay | Admitting: Family Medicine

## 2019-08-19 DIAGNOSIS — G629 Polyneuropathy, unspecified: Secondary | ICD-10-CM

## 2019-08-19 NOTE — Telephone Encounter (Signed)
Requested Prescriptions  Pending Prescriptions Disp Refills  . gabapentin (NEURONTIN) 300 MG capsule [Pharmacy Med Name: Gabapentin 300 MG Oral Capsule] 90 capsule 0    Sig: TAKE 1 CAPSULE BY MOUTH AT BEDTIME FOR  NEUROPATHY     Neurology: Anticonvulsants - gabapentin Passed - 08/19/2019 11:22 AM      Passed - Valid encounter within last 12 months    Recent Outpatient Visits          2 months ago Controlled type 2 diabetes with neuropathy [facility])   [facility] Birdie Sons, MD   6 months ago Essential hypertension   [facility] Birdie Sons, MD   1 year ago Annual physical exam   [facility] Birdie Sons, MD   1 year ago Type II diabetes mellitus with renal manifestations, uncontrolled ([place])   [facility] Birdie Sons, MD   1 year ago Type II diabetes mellitus with renal manifestations, uncontrolled ([place])   [facility] Birdie Sons, MD      Future Appointments            In 1 month Fisher, Kirstie Peri, MD [facility], [place]   In 5 months End, Harrell Gave, MD [facility], [place]

## 2019-09-03 ENCOUNTER — Ambulatory Visit: Payer: Medicare Other

## 2019-09-10 ENCOUNTER — Ambulatory Visit: Payer: Medicare Other

## 2019-09-21 ENCOUNTER — Telehealth: Payer: Self-pay | Admitting: Family Medicine

## 2019-09-21 NOTE — Telephone Encounter (Signed)
Pt called and stated he feels like he has an ulcer Steele Sizer has burning feeling from his stomach to his throat. Pt wanted a referral if Dr. Caryn Section is not able to treat him for this. Pt has an appt scheduled for 2.24.21 but would like to speak with nurse or Dr. Caryn Section today to see if there is anything he can take or do/or if he can see Dr. Caryn Section sooner/ please advise

## 2019-09-21 NOTE — Telephone Encounter (Signed)
Apt 09/22/2019 with Tawanna Sat  Thanks,   -Mickel Baas

## 2019-09-21 NOTE — Telephone Encounter (Signed)
Patient if Stuart Henry that needs attention before Stuart Henry is back in the office.  Thanks

## 2019-09-21 NOTE — Telephone Encounter (Signed)
Please schedule with anyone with openings today or tomorrow. OK to start virtually.

## 2019-09-21 NOTE — Progress Notes (Signed)
Patient: Stuart Henry Male    DOB: 1940/12/11   79 y.o.   MRN: [id number] Visit Date: 09/21/2019  Today's Provider: Mar Daring, PA-C   No chief complaint on file.  Subjective:    Virtual Visit via Telephone Note  I connected with Stuart Henry on 09/21/19 at  8:20 AM EST by telephone and verified that I am speaking with the correct person using two identifiers.  Location: Patient: Home Provider: [facility]   I discussed the limitations, risks, security and privacy concerns of performing an evaluation and management service by telephone and the availability of in person appointments. I also discussed with the patient that there may be a patient responsible charge related to this service. The patient expressed understanding and agreed to proceed.  Abdominal Pain This is a chronic problem. The problem has been gradually worsening. The pain is located in the generalized abdominal region. The quality of the pain is burning. The abdominal pain radiates to the epigastric region. Pertinent negatives include no anorexia, constipation, diarrhea, fever, frequency, headaches, nausea, vomiting or weight loss. The pain is aggravated by drinking alcohol. He has tried nothing for the symptoms. The treatment provided no relief. His past medical history is significant for abdominal surgery.   Patient reports this pain has been worsening for months. Started as mild and less frequent. Has been under more stress over the last few years. Also reports that he had drank more regularly in the past, but had been quit for many years. However, during the pandemic he became more lonely and started drinking wine more often. Once he started drinking alcohol more regularly he noticed his abdominal pain worsening with a burning sensation radiating up his esophagus with a globus sensation noted. 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 He does not use any other NSAIDs. Denies vomiting. Does have mild  constipation occasionally. Does have h/o hiatal hernia repair.     Allergies  Allergen Reactions  . No Known Allergies      Current Outpatient Medications:  .  ACCU-CHEK AVIVA PLUS test strip, CHECK BLOOD SUGAR ONCE  DAILY, Disp: 100 strip, Rfl: 3 .  allopurinol (ZYLOPRIM) 100 MG tablet, TAKE 1 TABLET DAILY, Disp: 90 tablet, Rfl: 3 .  aspirin EC 81 MG tablet, Take 1 tablet (81 mg total) by mouth daily., Disp: 90 tablet, Rfl: 3 .  Blood Glucose Monitoring Suppl (ACCU-CHEK AVIVA PLUS) w/Device KIT, USE TO CHECK BLOOD SUGAR  DAILY AS DIRECTED, Disp: 1 kit, Rfl: 0 .  dapagliflozin propanediol (FARXIGA) 5 MG TABS tablet, Take 5 mg by mouth daily. TAKE IN THE PLACE OF JARDIANCE, Disp: 90 tablet, Rfl: 4 .  gabapentin (NEURONTIN) 300 MG capsule, TAKE 1 CAPSULE BY MOUTH AT BEDTIME FOR  NEUROPATHY, Disp: 90 capsule, Rfl: 0 .  glipiZIDE (GLUCOTROL XL) 10 MG 24 hr tablet, TAKE 1 TABLET DAILY WITH BREAKFAST, Disp: 90 tablet, Rfl: 4 .  ketoconazole (NIZORAL) 2 % shampoo, USE ON SCALP TWICE WEEKLY, Disp: 120 mL, Rfl: 4 .  lisinopril (ZESTRIL) 5 MG tablet, Take 1 tablet (5 mg total) by mouth daily., Disp: 90 tablet, Rfl: 3 .  metFORMIN (GLUCOPHAGE) 1000 MG tablet, TAKE 1 TABLET TWICE A DAY WITH A MEAL, Disp: 180 tablet, Rfl: 3 .  nitroGLYCERIN (NITROSTAT) 0.4 MG SL tablet, Place 1 tablet (0.4 mg total) under the tongue every 5 (five) minutes as needed. Maximum of 3 doses., Disp: 25 tablet, Rfl: 5 .  pioglitazone (  ACTOS) 30 MG tablet, Take 1 tablet by mouth once daily, Disp: 90 tablet, Rfl: 0 .  rosuvastatin (CRESTOR) 10 MG tablet, Take 1 tablet (10 mg total) by mouth daily., Disp: 90 tablet, Rfl: 3  Review of Systems  Constitutional: Negative.  Negative for fever and weight loss.  HENT: Positive for sore throat.   Respiratory: Negative.   Cardiovascular: Negative.   Gastrointestinal: Positive for abdominal distention and abdominal pain. Negative for anal bleeding, anorexia, blood in stool, constipation,  diarrhea, nausea, rectal pain and vomiting.  Genitourinary: Negative for frequency.  Neurological: Negative for headaches.    Social History   Tobacco Use  . Smoking status: Former Smoker    Packs/day: 1.00    Years: 5.00    Pack years: 5.00    Types: Cigarettes    Quit date: 08/06/1972    Years since quitting: 47.1  . Smokeless tobacco: Never Used  Substance Use Topics  . Alcohol use: No    Alcohol/week: 0.0 standard drinks    Comment: 09/26/2016 "quit 01/30/12"      Objective:   There were no vitals taken for this visit. There were no vitals filed for this visit.There is no height or weight on file to calculate BMI.   Physical Exam Vitals reviewed.  Constitutional:      General: He is not in acute distress. Pulmonary:     Effort: No respiratory distress.  Neurological:     Mental Status: He is alert.  Psychiatric:        Mood and Affect: Mood normal.        Thought Content: Thought content normal.      No results found for any visits on 09/22/19.     Assessment & Plan    1. Gastroesophageal reflux disease with esophagitis without hemorrhage DDx: Gerd, gastritis, PUD, recurrence of hiatal hernia. Will start omeprazole 40mg BID as below x 1 month. If symptoms improving he can decrease to once daily dosing for the next 2 months to maintain and then may discontinue. He is to call if symptoms worsen or fail to improve. - omeprazole (PRILOSEC) 40 MG capsule; Take 1 capsule (40 mg total) by mouth twice daily.  Dispense: 180 capsule; Refill: 0     I discussed the assessment and treatment plan with the patient. The patient was provided an opportunity to ask questions and all were answered. The patient agreed with the plan and demonstrated an understanding of the instructions.   The patient was advised to call back or seek an in-person evaluation if the symptoms worsen or if the condition fails to improve as anticipated.  I provided 20 minutes of non-face-to-face time  during this encounter.   Jennifer M Burnette, PA-C  [facility]  

## 2019-09-22 ENCOUNTER — Telehealth (INDEPENDENT_AMBULATORY_CARE_PROVIDER_SITE_OTHER): Payer: Medicare Other | Admitting: Physician Assistant

## 2019-09-22 ENCOUNTER — Other Ambulatory Visit: Payer: Self-pay

## 2019-09-22 ENCOUNTER — Encounter: Payer: Self-pay | Admitting: Physician Assistant

## 2019-09-22 DIAGNOSIS — K2100 Gastro-esophageal reflux disease with esophagitis, without bleeding: Secondary | ICD-10-CM

## 2019-09-22 MED ORDER — OMEPRAZOLE 40 MG PO CPDR: 40.0000 mg | DELAYED_RELEASE_CAPSULE | Freq: Two times a day (BID) | ORAL | 0 refills | Status: DC

## 2019-09-22 MED ORDER — OMEPRAZOLE 40 MG PO CPDR: 40.0000 mg | DELAYED_RELEASE_CAPSULE | Freq: Every day | ORAL | 0 refills | Status: DC

## 2019-09-22 NOTE — Patient Instructions (Signed)
Gastroesophageal Reflux Disease, Adult Gastroesophageal reflux (GER) happens when acid from the stomach flows up into the tube that connects the mouth and the stomach (esophagus). Normally, food travels down the esophagus and stays in the stomach to be digested. With GER, food and stomach acid sometimes move back up into the esophagus. You may have a disease called gastroesophageal reflux disease (GERD) if the reflux:  Happens often.  Causes frequent or very bad symptoms.  Causes problems such as damage to the esophagus. When this happens, the esophagus becomes sore and swollen (inflamed). Over time, GERD can make small holes (ulcers) in the lining of the esophagus. What are the causes? This condition is caused by a problem with the muscle between the esophagus and the stomach. When this muscle is weak or not normal, it does not close properly to keep food and acid from coming back up from the stomach. The muscle can be weak because of:  Tobacco use.  Pregnancy.  Having a certain type of hernia (hiatal hernia).  Alcohol use.  Certain foods and drinks, such as coffee, chocolate, onions, and peppermint. What increases the risk? You are more likely to develop this condition if you:  Are overweight.  Have a disease that affects your connective tissue.  Use NSAID medicines. What are the signs or symptoms? Symptoms of this condition include:  Heartburn.  Difficult or painful swallowing.  The feeling of having a lump in the throat.  A bitter taste in the mouth.  Bad breath.  Having a lot of saliva.  Having an upset or bloated stomach.  Belching.  Chest pain. Different conditions can cause chest pain. Make sure you see your doctor if you have chest pain.  Shortness of breath or noisy breathing (wheezing).  Ongoing (chronic) cough or a cough at night.  Wearing away of the surface of teeth (tooth enamel).  Weight loss. How is this treated? Treatment will depend on how  bad your symptoms are. Your doctor may suggest:  Changes to your diet.  Medicine.  Surgery. Follow these instructions at home: Eating and drinking   Follow a diet as told by your doctor. You may need to avoid foods and drinks such as: ? Coffee and tea (with or without caffeine). ? Drinks that contain alcohol. ? Energy drinks and sports drinks. ? Bubbly (carbonated) drinks or sodas. ? Chocolate and cocoa. ? Peppermint and mint flavorings. ? Garlic and onions. ? Horseradish. ? Spicy and acidic foods. These include peppers, chili powder, curry powder, vinegar, hot sauces, and BBQ sauce. ? Citrus fruit juices and citrus fruits, such as oranges, lemons, and limes. ? Tomato-based foods. These include red sauce, chili, salsa, and pizza with red sauce. ? Fried and fatty foods. These include donuts, french fries, potato chips, and high-fat dressings. ? High-fat meats. These include hot dogs, rib eye steak, sausage, ham, and bacon. ? High-fat dairy items, such as whole milk, butter, and cream cheese.  Eat small meals often. Avoid eating large meals.  Avoid drinking large amounts of liquid with your meals.  Avoid eating meals during the 2-3 hours before bedtime.  Avoid lying down right after you eat.  Do not exercise right after you eat. Lifestyle   Do not use any products that contain nicotine or tobacco. These include cigarettes, e-cigarettes, and chewing tobacco. If you need help quitting, ask your doctor.  Try to lower your stress. If you need help doing this, ask your doctor.  If you are overweight, lose an amount   of weight that is healthy for you. Ask your doctor about a safe weight loss goal. General instructions  Pay attention to any changes in your symptoms.  Take over-the-counter and prescription medicines only as told by your doctor. Do not take aspirin, ibuprofen, or other NSAIDs unless your doctor says it is okay.  Wear loose clothes. Do not wear anything tight  around your waist.  Raise (elevate) the head of your bed about 6 inches (15 cm).  Avoid bending over if this makes your symptoms worse.  Keep all follow-up visits as told by your doctor. This is important. Contact a doctor if:  You have new symptoms.  You lose weight and you do not know why.  You have trouble swallowing or it hurts to swallow.  You have wheezing or a cough that keeps happening.  Your symptoms do not get better with treatment.  You have a hoarse voice. Get help right away if:  You have pain in your arms, neck, jaw, teeth, or back.  You feel sweaty, dizzy, or light-headed.  You have chest pain or shortness of breath.  You throw up (vomit) and your throw-up looks like blood or coffee grounds.  You pass out (faint).  Your poop (stool) is bloody or black.  You cannot swallow, drink, or eat. Summary  If a person has gastroesophageal reflux disease (GERD), food and stomach acid move back up into the esophagus and cause symptoms or problems such as damage to the esophagus.  Treatment will depend on how bad your symptoms are.  Follow a diet as told by your doctor.  Take all medicines only as told by your doctor. This information is not intended to replace advice given to you by your health care provider. Make sure you discuss any questions you have with your health care provider. Document Revised: 01/27/2018 Document Reviewed: 01/27/2018 Elsevier Patient Education  2020 [employer]  Food Choices for Gastroesophageal Reflux Disease, Adult When you have gastroesophageal reflux disease (GERD), the foods you eat and your eating habits are very important. Choosing the right foods can help ease your discomfort. Think about working with a nutrition specialist (dietitian) to help you make good choices. What are tips for following this plan?  Meals  Choose healthy foods that are low in fat, such as fruits, vegetables, whole grains, low-fat dairy products, and  lean meat, fish, and poultry.  Eat small meals often instead of 3 large meals a day. Eat your meals slowly, and in a place where you are relaxed. Avoid bending over or lying down until 2-3 hours after eating.  Avoid eating meals 2-3 hours before bed.  Avoid drinking a lot of liquid with meals.  Cook foods using methods other than frying. Bake, grill, or broil food instead.  Avoid or limit: ? Chocolate. ? Peppermint or spearmint. ? Alcohol. ? Pepper. ? Black and decaffeinated coffee. ? Black and decaffeinated tea. ? Bubbly (carbonated) soft drinks. ? Caffeinated energy drinks and soft drinks.  Limit high-fat foods such as: ? Fatty meat or fried foods. ? Whole milk, cream, butter, or ice cream. ? Nuts and nut butters. ? Pastries, donuts, and sweets made with butter or shortening.  Avoid foods that cause symptoms. These foods may be different for everyone. Common foods that cause symptoms include: ? Tomatoes. ? Oranges, lemons, and limes. ? Peppers. ? Spicy food. ? Onions and garlic. ? Vinegar. Lifestyle  Maintain a healthy weight. Ask your doctor what weight is healthy for you. If you   need to lose weight, work with your doctor to do so safely.  Exercise for at least 30 minutes for 5 or more days each week, or as told by your doctor.  Wear loose-fitting clothes.  Do not smoke. If you need help quitting, ask your doctor.  Sleep with the head of your bed higher than your feet. Use a wedge under the mattress or blocks under the bed frame to raise the head of the bed. Summary  When you have gastroesophageal reflux disease (GERD), food and lifestyle choices are very important in easing your symptoms.  Eat small meals often instead of 3 large meals a day. Eat your meals slowly, and in a place where you are relaxed.  Limit high-fat foods such as fatty meat or fried foods.  Avoid bending over or lying down until 2-3 hours after eating.  Avoid peppermint and spearmint,  caffeine, alcohol, and chocolate. This information is not intended to replace advice given to you by your health care provider. Make sure you discuss any questions you have with your health care provider. Document Revised: 11/11/2018 Document Reviewed: 08/26/2016 Elsevier Patient Education  2020 [employer] Inc.  

## 2019-09-23 ENCOUNTER — Ambulatory Visit: Payer: Medicare Other | Admitting: Physician Assistant

## 2019-09-27 NOTE — Progress Notes (Signed)
Patient: Stuart Henry Male    DOB: Jul 25, 1941   79 y.o.   MRN: [id number] Visit Date: 09/28/2019  Today's Provider: [name], MD   Chief Complaint  Patient presents with  . Diabetes  . Hypertension   Subjective:     HPI  Diabetes Mellitus Type II, Follow-up:   Lab Results  Component Value Date   HGBA1C 7.5 (A) 09/28/2019   HGBA1C 6.9 (A) 05/27/2019   HGBA1C 7.3 (A) 01/26/2019    Last seen for diabetes 4 months ago.  Management since then includes no changes. He reports good compliance with treatment. He is not having side effects.  Current symptoms include none and have been stable. Home blood sugar records: fasting range: 160-170  Episodes of hypoglycemia? no   Current insulin regiment: Is not on insulin Most Recent Eye Exam: 09/08/2018 Weight trend: fluctuating a bit Prior visit with dietician: No Current exercise: bicycling and walking Current diet habits: well balanced  Pertinent Labs:    Component Value Date/Time   CHOL 209 (H) 08/04/2019 0925   CHOL 103 08/18/2018 1016   TRIG 70 08/04/2019 0925   HDL 47 08/04/2019 0925   HDL 36 (L) 08/18/2018 1016   LDLCALC 148 (H) 08/04/2019 0925   LDLCALC 48 08/18/2018 1016   CREATININE 1.13 08/04/2019 0925    Wt Readings from Last 3 Encounters:  09/28/19 230 lb (104.3 kg)  07/20/19 234 lb (106.1 kg)  05/27/19 230 lb 9.6 oz (104.6 kg)    ------------------------------------------------------------------------  Hypertension, follow-up:  BP Readings from Last 3 Encounters:  09/28/19 (!) 142/80  07/20/19 138/80  05/27/19 (!) 144/80    He was last seen for hypertension 4 months ago.  BP at that visit was 144/80. Management since that visit includes advising patient to work on avoiding sodium in his diet. He reports good compliance with treatment. He is not having side effects.  He is exercising. He is adherent to low salt diet.   Outside blood pressures are 139/80. He is experiencing  none.  Patient denies chest pain, chest pressure/discomfort, claudication, dyspnea, exertional chest pressure/discomfort, fatigue, irregular heart beat, lower extremity edema, near-syncope, orthopnea, palpitations, paroxysmal nocturnal dyspnea, syncope and tachypnea.   Cardiovascular risk factors include advanced age (older than 21 for men, 76 for women), diabetes mellitus and hypertension.  Use of agents associated with hypertension: NSAIDS.     Weight trend: fluctuating a bit Wt Readings from Last 3 Encounters:  09/28/19 230 lb (104.3 kg)  07/20/19 234 lb (106.1 kg)  05/27/19 230 lb 9.6 oz (104.6 kg)    Current diet: well balanced  ------------------------------------------------------------------------  He also complaints of mid abdominal pain around epigastrium for several months, but he started drinking wine again a few months ago and it got much worse. He has virtual visit recently with [facility] and prescribed omeprazole, and has stoppeding drinking alcohol and pain has greatly improved, but not resolved. It gets a little worse when eating. No sensation of food getting stuck in lower esophagus, but does have history of hiatal hernia and occasionally has sensation of food catching in his throat.   Allergies  Allergen Reactions  . No Known Allergies      Current Outpatient Medications:  .  ACCU-CHEK AVIVA PLUS test strip, CHECK BLOOD SUGAR ONCE  DAILY, Disp: 100 strip, Rfl: 3 .  allopurinol (ZYLOPRIM) 100 MG tablet, TAKE 1 TABLET DAILY, Disp: 90 tablet, Rfl: 3 .  aspirin EC 81 MG tablet, Take  1 tablet (81 mg total) by mouth daily., Disp: 90 tablet, Rfl: 3 .  Blood Glucose Monitoring Suppl (ACCU-CHEK AVIVA PLUS) w/Device KIT, USE TO CHECK BLOOD SUGAR  DAILY AS DIRECTED, Disp: 1 kit, Rfl: 0 .  dapagliflozin propanediol (FARXIGA) 5 MG TABS tablet, Take 5 mg by mouth daily. TAKE IN THE PLACE OF JARDIANCE, Disp: 90 tablet, Rfl: 4 .  gabapentin (NEURONTIN) 300 MG capsule, TAKE 1  CAPSULE BY MOUTH AT BEDTIME FOR  NEUROPATHY, Disp: 90 capsule, Rfl: 0 .  glipiZIDE (GLUCOTROL XL) 10 MG 24 hr tablet, TAKE 1 TABLET DAILY WITH BREAKFAST, Disp: 90 tablet, Rfl: 4 .  ketoconazole (NIZORAL) 2 % shampoo, USE ON SCALP TWICE WEEKLY, Disp: 120 mL, Rfl: 4 .  lisinopril (ZESTRIL) 5 MG tablet, Take 1 tablet (5 mg total) by mouth daily., Disp: 90 tablet, Rfl: 3 .  metFORMIN (GLUCOPHAGE) 1000 MG tablet, TAKE 1 TABLET TWICE A DAY WITH A MEAL, Disp: 180 tablet, Rfl: 3 .  nitroGLYCERIN (NITROSTAT) 0.4 MG SL tablet, Place 1 tablet (0.4 mg total) under the tongue every 5 (five) minutes as needed. Maximum of 3 doses., Disp: 25 tablet, Rfl: 5 .  omeprazole (PRILOSEC) 40 MG capsule, Take 1 capsule (40 mg total) by mouth in the morning and at bedtime., Disp: 180 capsule, Rfl: 0 .  pioglitazone (ACTOS) 30 MG tablet, Take 1 tablet by mouth once daily, Disp: 90 tablet, Rfl: 0 .  rosuvastatin (CRESTOR) 10 MG tablet, Take 1 tablet (10 mg total) by mouth daily., Disp: 90 tablet, Rfl: 3  Review of Systems  Constitutional: Negative for appetite change, chills and fever.  Respiratory: Negative for chest tightness, shortness of breath and wheezing.   Cardiovascular: Negative for chest pain and palpitations.  Gastrointestinal: Positive for abdominal pain (started up after drinking alcohol a couple of months ago). Negative for nausea and vomiting.    Social History   Tobacco Use  . Smoking status: Former Smoker    Packs/day: 1.00    Years: 5.00    Pack years: 5.00    Types: Cigarettes    Quit date: 08/06/1972    Years since quitting: 47.1  . Smokeless tobacco: Never Used  Substance Use Topics  . Alcohol use: Not Currently    Alcohol/week: 0.0 standard drinks    Comment: 09/26/2016 "quit 01/30/12"      Objective:   BP (!) 142/80 (BP Location: Right Arm, Patient Position: Sitting, Cuff Size: Large)   Pulse 63   Temp (!) 96.9 F (36.1 C) (Temporal)   Resp 16   Wt 230 lb (104.3 kg)   SpO2 99%  Comment: room air  BMI 28.00 kg/m  Vitals:   09/28/19 0821 09/28/19 0828  BP: (!) 152/78 (!) 142/80  Pulse: 63   Resp: 16   Temp: (!) 96.9 F (36.1 C)   TempSrc: Temporal   SpO2: 99%   Weight: 230 lb (104.3 kg)   Body mass index is 28 kg/m.   Physical Exam   General: Appearance:     Overweight male in no acute distress  Eyes:    PERRL, conjunctiva/corneas clear, EOM's intact       Lungs:     Clear to auscultation bilaterally, respirations unlabored  Heart:    Normal heart rate. Normal rhythm. No murmurs, rubs, or gallops.   MS:   All extremities are intact.   Neurologic:   Awake, alert, oriented x 3. No apparent focal neurological  defect.        Results for orders placed or performed in visit on 09/28/19  POCT HgB A1C  Result Value Ref Range   Hemoglobin A1C 7.5 (A) 4.0 - 5.6 %   Est. average glucose Bld gHb Est-mCnc 169        Assessment & Plan    1. Controlled type 2 diabetes with neuropathy (HCC) A1c is rising. Will increase dapaglizlozin to 86m daily  2. Type II diabetes mellitus with renal manifestations, uncontrolled (HCC)   3. Epigastric pain Improved since stopping alcohol consumption and starting omeprazole.  - Amylase - Lipase - H. pylori breath test  4. Essential hypertension Well controlled.  Continue current medications.    Follow up to check a1c in 3 months.       The entirety of the information documented in the History of Present Illness, Review of Systems and Physical Exam were personally obtained by me. Portions of this information were initially documented by RMeyer Cory CMA and reviewed by me for thoroughness and accuracy.   DLelon Huh MD  [facility]

## 2019-09-28 ENCOUNTER — Ambulatory Visit: Payer: Medicare Other | Admitting: Family Medicine

## 2019-09-28 ENCOUNTER — Encounter: Payer: Self-pay | Admitting: Family Medicine

## 2019-09-28 ENCOUNTER — Other Ambulatory Visit: Payer: Self-pay

## 2019-09-28 VITALS — BP 142/80 | HR 63 | Temp 96.9°F | Resp 16 | Wt 230.0 lb

## 2019-09-28 DIAGNOSIS — E1140 Type 2 diabetes mellitus with diabetic neuropathy, unspecified: Secondary | ICD-10-CM

## 2019-09-28 DIAGNOSIS — E1129 Type 2 diabetes mellitus with other diabetic kidney complication: Secondary | ICD-10-CM

## 2019-09-28 DIAGNOSIS — IMO0002 Reserved for concepts with insufficient information to code with codable children: Secondary | ICD-10-CM

## 2019-09-28 DIAGNOSIS — R1013 Epigastric pain: Secondary | ICD-10-CM | POA: Diagnosis not present

## 2019-09-28 DIAGNOSIS — E1165 Type 2 diabetes mellitus with hyperglycemia: Secondary | ICD-10-CM

## 2019-09-28 DIAGNOSIS — I10 Essential (primary) hypertension: Secondary | ICD-10-CM

## 2019-09-28 LAB — POCT GLYCOSYLATED HEMOGLOBIN (HGB A1C)
Est. average glucose Bld gHb Est-mCnc: 169
Hemoglobin A1C: 7.5 % — AB (ref 4.0–5.6)

## 2019-09-28 MED ORDER — FARXIGA 10 MG PO TABS: 10.0000 mg | ORAL_TABLET | Freq: Every day | ORAL | 3 refills | Status: DC

## 2019-09-28 NOTE — Patient Instructions (Signed)
.   Please review the attached list of medications and notify my office if there are any errors.   . Please bring all of your medications to every appointment so we can make sure that our medication list is the same as yours.   

## 2019-09-29 ENCOUNTER — Other Ambulatory Visit: Payer: Self-pay | Admitting: Family Medicine

## 2019-09-29 DIAGNOSIS — E118 Type 2 diabetes mellitus with unspecified complications: Secondary | ICD-10-CM

## 2019-09-29 LAB — AMYLASE: Amylase: 50 U/L (ref 31–110)

## 2019-09-29 LAB — LIPASE: Lipase: 38 U/L (ref 13–78)

## 2019-09-30 LAB — H. PYLORI BREATH TEST: H pylori Breath Test: NEGATIVE

## 2019-10-13 LAB — HM DIABETES EYE EXAM

## 2019-10-26 ENCOUNTER — Other Ambulatory Visit: Payer: Self-pay | Admitting: Family Medicine

## 2019-10-26 MED ORDER — FARXIGA 10 MG PO TABS: 10.0000 mg | ORAL_TABLET | Freq: Every day | ORAL | 1 refills | Status: DC

## 2019-10-26 NOTE — Telephone Encounter (Signed)
Medication Refill - Medication: dapagliflozin propanediol (FARXIGA) 10 MG TABS tablet  Has the patient contacted their pharmacy? Yes - states that this should have been sent to [facility], but they never received it.  In the meantime pt has changed insurance and needs sent to a new pharmacy.  Pt states that he has been without since 10/03/2019. (Agent: If no, request that the patient contact the pharmacy for the refill.) (Agent: If yes, when and what did the pharmacy advise?)  Preferred Pharmacy (with phone number or street name):  [facility], [place] Phone:  [phone]  Fax:  [phone]     Agent: Please be advised that RX refills may take up to 3 business days. We ask that you follow-up with your pharmacy.

## 2019-11-03 ENCOUNTER — Other Ambulatory Visit: Payer: Self-pay | Admitting: Family Medicine

## 2019-11-03 DIAGNOSIS — G629 Polyneuropathy, unspecified: Secondary | ICD-10-CM

## 2019-11-09 ENCOUNTER — Other Ambulatory Visit
Admission: RE | Admit: 2019-11-09 | Discharge: 2019-11-09 | Disposition: A | Discharge status: Home / Self Care | Payer: Medicare Other | Source: Ambulatory Visit | Attending: Internal Medicine | Admitting: Internal Medicine

## 2019-11-09 DIAGNOSIS — E785 Hyperlipidemia, unspecified: Secondary | ICD-10-CM | POA: Insufficient documentation

## 2019-11-09 DIAGNOSIS — Z79899 Other long term (current) drug therapy: Secondary | ICD-10-CM | POA: Diagnosis present

## 2019-11-09 LAB — ALT: ALT: 20 U/L (ref 0–44)

## 2019-11-09 LAB — LIPID PANEL
Cholesterol: 102 mg/dL (ref 0–200)
HDL: 33 mg/dL — ABNORMAL LOW (ref >40)
LDL Cholesterol: 54 mg/dL (ref 0–99)
Total CHOL/HDL Ratio: 3.1 RATIO
Triglycerides: 75 mg/dL (ref <150)
VLDL: 15 mg/dL (ref 0–40)

## 2019-11-18 ENCOUNTER — Other Ambulatory Visit: Payer: Self-pay | Admitting: Family Medicine

## 2019-11-18 MED ORDER — ALLOPURINOL 100 MG PO TABS: 100.0000 mg | ORAL_TABLET | Freq: Every day | ORAL | 3 refills | Status: DC

## 2019-11-18 NOTE — Telephone Encounter (Signed)
[facility] faxed refill request for the following medications: [address] [employer] [facility] [phone] Phone - [phone]  allopurinol (ZYLOPRIM) 100 MG tablet Pres Qty 90   Please advise.  Thanks, [employer]

## 2019-12-27 NOTE — Progress Notes (Signed)
Established patient visit   Patient: Stuart Henry   DOB: 30-Mar-1941   79 y.o. Male  MRN: [id number] Visit Date: 12/28/2019  Today's healthcare provider: [name], MD   Chief Complaint  Patient presents with  . Diabetes  . Hypertension   [employer] as a scribe for [name], MD.,have documented all relevant documentation on the behalf of [name], MD,as directed by  [name], MD while in the presence of [name], MD.  Subjective    HPI Diabetes Mellitus Type II, Follow-up  Lab Results  Component Value Date   HGBA1C 7.5 (A) 09/28/2019   HGBA1C 6.9 (A) 05/27/2019   HGBA1C 7.3 (A) 01/26/2019   Wt Readings from Last 3 Encounters:  12/28/19 220 lb 9.6 oz (100.1 kg)  09/28/19 230 lb (104.3 kg)  07/20/19 234 lb (106.1 kg)   Last seen for diabetes 3 months ago.  Management since then includes increasing Dapaglizlozin to 41m daily. He reports good compliance with treatment. He is not having side effects.  Symptoms: No fatigue No foot ulcerations  No appetite changes No nausea  No paresthesia of the feet  No polydipsia  No polyuria No visual disturbances   No vomiting     Home blood sugar records: fasting range: low 100's  [facility].   Episodes of hypoglycemia? No    Current insulin regiment: none Most Recent Eye Exam: 10/13/2019 Current exercise: none Current diet habits: in general, a "healthy" diet    Pertinent Labs: Lab Results  Component Value Date   CHOL 102 11/09/2019   HDL 33 (L) 11/09/2019   LDLCALC 54 11/09/2019   TRIG 75 11/09/2019   CHOLHDL 3.1 11/09/2019   Lab Results  Component Value Date   NA 138 08/04/2019   K 5.0 08/04/2019   CREATININE 1.13 08/04/2019   GFRNONAA >60 08/04/2019   GFRAA >60 08/04/2019   GLUCOSE 176 (H) 08/04/2019     ---------------------------------------------------------------------------------------------------  Hypertension,  follow-up  BP Readings from Last 3 Encounters:  12/28/19 117/72  09/28/19 (!) 142/80  07/20/19 138/80   Wt Readings from Last 3 Encounters:  12/28/19 220 lb 9.6 oz (100.1 kg)  09/28/19 230 lb (104.3 kg)  07/20/19 234 lb (106.1 kg)     He was last seen for hypertension 3 months ago.  BP at that visit was 142/80. Management since that visit includes continuing same medication.  He reports good compliance with treatment. He is not having side effects.  He is following a Regular diet. He is not exercising. He does not smoke.  Use of agents associated with hypertension: NSAIDS.   Outside blood pressures are being checked periodically. Symptoms: No chest pain No chest pressure  No palpitations No syncope  No dyspnea No orthopnea  No paroxysmal nocturnal dyspnea No lower extremity edema   Pertinent labs: Lab Results  Component Value Date   CHOL 102 11/09/2019   HDL 33 (L) 11/09/2019   LDLCALC 54 11/09/2019   TRIG 75 11/09/2019   CHOLHDL 3.1 11/09/2019   Lab Results  Component Value Date   NA 138 08/04/2019   K 5.0 08/04/2019   CREATININE 1.13 08/04/2019   GFRNONAA >60 08/04/2019   GFRAA >60 08/04/2019   GLUCOSE 176 (H) 08/04/2019     The ASCVD Risk score (Goff DC Jr., et al., 2013) failed to calculate for the following reasons:   The valid total cholesterol range is  130 to 320 mg/dL   ---------------------------------------------------------------------------------------------------    Medications: Outpatient Medications Prior to Visit  Medication Sig  . ACCU-CHEK AVIVA PLUS test strip CHECK BLOOD SUGAR ONCE  DAILY  . allopurinol (ZYLOPRIM) 100 MG tablet Take 1 tablet (100 mg total) by mouth daily.  Marland Kitchen aspirin EC 81 MG tablet Take 1 tablet (81 mg total) by mouth daily.  . Blood Glucose Monitoring Suppl (ACCU-CHEK AVIVA PLUS) w/Device KIT USE TO CHECK BLOOD SUGAR  DAILY AS DIRECTED  . dapagliflozin propanediol (FARXIGA) 10 MG TABS tablet Take 10 mg by mouth daily  before breakfast.  . gabapentin (NEURONTIN) 300 MG capsule TAKE 1 CAPSULE BY MOUTH AT BEDTIME FOR  NEUROPATHY  . glipiZIDE (GLUCOTROL XL) 10 MG 24 hr tablet TAKE 1 TABLET DAILY WITH BREAKFAST  . ketoconazole (NIZORAL) 2 % shampoo USE ON SCALP TWICE WEEKLY  . metFORMIN (GLUCOPHAGE) 1000 MG tablet TAKE 1 TABLET TWICE A DAY WITH A MEAL  . nitroGLYCERIN (NITROSTAT) 0.4 MG SL tablet Place 1 tablet (0.4 mg total) under the tongue every 5 (five) minutes as needed. Maximum of 3 doses.  Marland Kitchen omeprazole (PRILOSEC) 40 MG capsule Take 1 capsule (40 mg total) by mouth in the morning and at bedtime.  . pioglitazone (ACTOS) 30 MG tablet Take 1 tablet by mouth once daily  . [DISCONTINUED] dapagliflozin propanediol (FARXIGA) 5 MG TABS tablet Take 5 mg by mouth daily. TAKE IN THE PLACE OF JARDIANCE  . lisinopril (ZESTRIL) 5 MG tablet Take 1 tablet (5 mg total) by mouth daily.  . rosuvastatin (CRESTOR) 10 MG tablet Take 1 tablet (10 mg total) by mouth daily.   No facility-administered medications prior to visit.    Review of Systems  Constitutional: Negative for appetite change, chills and fever.  Respiratory: Negative for chest tightness, shortness of breath and wheezing.   Cardiovascular: Negative for chest pain and palpitations.  Gastrointestinal: Negative for abdominal pain, nausea and vomiting.     Objective    BP 117/72 (BP Location: Right Arm, Patient Position: Sitting, Cuff Size: Large)   Pulse 70   Temp (!) 97.1 F (36.2 C) (Temporal)   Wt 220 lb 9.6 oz (100.1 kg)   BMI 26.85 kg/m   Physical Exam   General appearance:  Overweight male, cooperative and in no acute distress Head: Normocephalic, without obvious abnormality, atraumatic Respiratory: Respirations even and unlabored, normal respiratory rate Extremities: All extremities are intact.  Skin: Skin color, texture, turgor normal. No rashes seen  Psych: Appropriate mood and affect. Neurologic: Mental status: Alert, oriented to person,  place, and time, thought content appropriate.    Results for orders placed or performed in visit on 12/28/19  POCT HgB A1C  Result Value Ref Range   Hemoglobin A1C 8.1 (A) 4.0 - 5.6 %   Est. average glucose Bld gHb Est-mCnc 186     Assessment & Plan     1. Controlled type 2 diabetes with neuropathy (HCC) A1c up today, but home blood sugars are very good. Will continue current medications for now and recheck in 4 months.  - pioglitazone (ACTOS) 30 MG tablet; Take 1 tablet (30 mg total) by mouth daily.  Dispense: 90 tablet; Refill: 4 - glipiZIDE (GLUCOTROL XL) 10 MG 24 hr tablet; Take 1 tablet (10 mg total) by mouth daily with breakfast.  Dispense: 90 tablet; Refill: 4 - metFORMIN (GLUCOPHAGE) 1000 MG tablet; TAKE 1 TABLET TWICE A DAY WITH A MEAL  Dispense: 180 tablet; Refill: 4  2. Type II diabetes mellitus  with renal manifestations, uncontrolled ([place])   3. Essential hypertension Well controlled.  Continue current medications.    4. Coronary artery disease involving native coronary artery of native heart with angina pectoris (HCC) Asymptomatic. Compliant with medication.  Continue aggressive risk factor modification.    5. Gastroesophageal reflux disease, unspecified whether esophagitis present Much better since getting back on omeprazole which he has about completed, advised he can try stopping medication, but if sx return he is to call back for refill.  6. Chronic knee pain Flared up after he started exercising again on treadmill. Didn't improve with Voltaren gel. Recommended using OTC knee brace.  No follow-ups on file.      The entirety of the information documented in the History of Present Illness, Review of Systems and Physical Exam were personally obtained by me. Portions of this information were initially documented by the CMA and reviewed by me for thoroughness and accuracy.      [name], MD  [facility] [phone] (phone) [phone]  (fax)  [place]

## 2019-12-28 ENCOUNTER — Ambulatory Visit: Payer: Medicare Other | Admitting: Family Medicine

## 2019-12-28 ENCOUNTER — Other Ambulatory Visit: Payer: Self-pay

## 2019-12-28 ENCOUNTER — Encounter: Payer: Self-pay | Admitting: Family Medicine

## 2019-12-28 VITALS — BP 117/72 | HR 70 | Temp 97.1°F | Wt 220.6 lb

## 2019-12-28 DIAGNOSIS — I25119 Atherosclerotic heart disease of native coronary artery with unspecified angina pectoris: Secondary | ICD-10-CM

## 2019-12-28 DIAGNOSIS — E1165 Type 2 diabetes mellitus with hyperglycemia: Secondary | ICD-10-CM

## 2019-12-28 DIAGNOSIS — I10 Essential (primary) hypertension: Secondary | ICD-10-CM

## 2019-12-28 DIAGNOSIS — E1129 Type 2 diabetes mellitus with other diabetic kidney complication: Secondary | ICD-10-CM | POA: Diagnosis not present

## 2019-12-28 DIAGNOSIS — K219 Gastro-esophageal reflux disease without esophagitis: Secondary | ICD-10-CM

## 2019-12-28 DIAGNOSIS — E1140 Type 2 diabetes mellitus with diabetic neuropathy, unspecified: Secondary | ICD-10-CM | POA: Diagnosis not present

## 2019-12-28 DIAGNOSIS — IMO0002 Reserved for concepts with insufficient information to code with codable children: Secondary | ICD-10-CM

## 2019-12-28 DIAGNOSIS — M25561 Pain in right knee: Secondary | ICD-10-CM

## 2019-12-28 LAB — POCT GLYCOSYLATED HEMOGLOBIN (HGB A1C)
Est. average glucose Bld gHb Est-mCnc: 186
Hemoglobin A1C: 8.1 % — AB (ref 4.0–5.6)

## 2019-12-28 MED ORDER — METFORMIN HCL 1000 MG PO TABS: ORAL_TABLET | ORAL | 4 refills | Status: DC

## 2019-12-28 MED ORDER — GLIPIZIDE ER 10 MG PO TB24: 10.0000 mg | ORAL_TABLET | Freq: Every day | ORAL | 4 refills | Status: DC

## 2019-12-28 MED ORDER — PIOGLITAZONE HCL 30 MG PO TABS: 30.0000 mg | ORAL_TABLET | Freq: Every day | ORAL | 4 refills | Status: DC

## 2019-12-28 NOTE — Patient Instructions (Addendum)
.   Please review the attached list of medications and notify my office if there are any errors.   . Please bring all of your medications to every appointment so we can make sure that our medication list is the same as yours.    You should wear an elastic knee brace whenever you are on your feet

## 2019-12-30 ENCOUNTER — Other Ambulatory Visit: Payer: Self-pay | Admitting: Family Medicine

## 2019-12-30 DIAGNOSIS — E1140 Type 2 diabetes mellitus with diabetic neuropathy, unspecified: Secondary | ICD-10-CM

## 2020-01-23 ENCOUNTER — Other Ambulatory Visit: Payer: Self-pay

## 2020-01-23 ENCOUNTER — Encounter: Payer: Self-pay | Admitting: Internal Medicine

## 2020-01-23 ENCOUNTER — Ambulatory Visit (INDEPENDENT_AMBULATORY_CARE_PROVIDER_SITE_OTHER): Payer: Medicare Other | Admitting: Internal Medicine

## 2020-01-23 VITALS — BP 132/80 | HR 54 | Ht 76.0 in | Wt 217.4 lb

## 2020-01-23 DIAGNOSIS — R5383 Other fatigue: Secondary | ICD-10-CM

## 2020-01-23 DIAGNOSIS — Z79899 Other long term (current) drug therapy: Secondary | ICD-10-CM

## 2020-01-23 DIAGNOSIS — I10 Essential (primary) hypertension: Secondary | ICD-10-CM

## 2020-01-23 DIAGNOSIS — I255 Ischemic cardiomyopathy: Secondary | ICD-10-CM | POA: Diagnosis not present

## 2020-01-23 DIAGNOSIS — E118 Type 2 diabetes mellitus with unspecified complications: Secondary | ICD-10-CM

## 2020-01-23 DIAGNOSIS — I2510 Atherosclerotic heart disease of native coronary artery without angina pectoris: Secondary | ICD-10-CM

## 2020-01-23 DIAGNOSIS — E785 Hyperlipidemia, unspecified: Secondary | ICD-10-CM

## 2020-01-23 MED ORDER — LISINOPRIL 10 MG PO TABS: 10.0000 mg | ORAL_TABLET | Freq: Every day | ORAL | 2 refills | Status: DC

## 2020-01-23 NOTE — Progress Notes (Signed)
Follow-up Outpatient Visit Date: 01/23/2020  Primary Care Provider: Birdie Sons, MD [address] [place] [address]  Chief Complaint: Fatigue  HPI:  [name] is a 79 y.o. male with history of coronary artery disease status post CABG (11/812) complicated by postoperative atrial fibrillation, ischemic cardiomyopathy(LVEF normal by echo in 04/2017), type 2 diabetes mellitus, and hypertension, who presents for follow-up of coronary artery disease and ischemic cardiomyopathy.  I last saw [name] in 07/2019, at which time he was doing relatively well.  He was continued to exercise regularly, but felt as though his legs were not as strong as they used to be.  Blood pressure was noted to be mildly elevated in the office and also on checks at home.  We therefore agreed to restart lisinopril 5 mg daily.  Today, [name] notes that he continues to have some fatigue that is more pronounced compared to a year or two.  He is still able to exercise regularly without any limitations.  He feels like his most strenuous activity is actually playing golf and that he is often fairly worn out by the time he reaches the 14th hole.  He denies chest pain, shortness of breath, palpitations, lightheadedness, and edema.  He notes that his blood sugars were higher a few weeks ago with a recent hemoglobin A1c of 8.1%.  With lifestyle modifications, his sugars seem to be improving.  Home blood pressure has been more normal since lisinopril was added last year.  --------------------------------------------------------------------------------------------------  Cardiovascular History & Procedures: Cardiovascular Problems:  Coronary artery disease status post CABG (09/2016)  Ischemic cardiomyopathy  Postoperative atrial fibrillation  Risk Factors:  Known coronary artery disease, diabetes mellitus, hypertension, age greater than 59, and male gender  Cath/PCI:  LHC (09/16/16): Significant  two-vessel CAD with 95% proximal LAD stenosis involving ostium of D1, 90% ostial ramus intermedius stenosis, and at least 50% lesions involving the ostium and mid section of the LCx. RCA with 25% distal stenosis. LVEF 55-60% with normal LVEDP.  CV Surgery:  CABG (09/24/16, Dr. Roxan Hockey): LIMA to LAD, SVG to D1, and sequential SVG to first and second branches of ramus intermedius.  EP Procedures and Devices:  None  Non-Invasive Evaluation(s):  Exercise MPI (04/07/2018): Interpreted as intermediate risk with possible apical anterior and apical ischemia and small inferolateral infarct. Normal LVEF. Greater than 1 mm ST depression at stress. I personally reviewed the images and believe that the apical defect represents artifact, as there is normal perfusion in this region on the non-attenuation corrected images. Only perfusion abnormality is a small apical inferior/lateral defect that may represent prior infarct, consistent with a low risk study.  Transthoracic echocardiogram (04/23/17): Normal LV size. LVEF 55-60% with anteroseptal hypokinesis (? related to prior cardiac surgery). Normal diastolic function. Borderline dilated aortic root (3.7 cm). Mild MR. Normal RV size and function. Normal pulmonary artery pressure.  Carotid Doppler (09/23/16): Mild (1-39%) stenoses involving the left and right internal carotid arteries. Vertebral arteries with antegrade flow.  ABIs (09/23/16): Right 1.98 and the left 1.49 suggestive of medial calcification.  Transthoracic echocardiogram (09/19/16): Normal LV size with mildly reduced contraction (EF 45-50%) with basal and mid anterior and anteroseptal hypokinesis. Grade 1 diastolic dysfunction. Mildly dilated left atrium. Normal RV size and function.  Recent CV Pertinent Labs: Lab Results  Component Value Date   CHOL 102 11/09/2019   CHOL 103 08/18/2018   HDL 33 (L) 11/09/2019   HDL 36 (L) 08/18/2018   LDLCALC 54 11/09/2019  LDLCALC 48 08/18/2018    TRIG 75 11/09/2019   CHOLHDL 3.1 11/09/2019   INR 1.17 09/29/2016   BNP 60.0 11/16/2017   K 5.0 08/04/2019   MG 1.9 09/25/2016   BUN 23 08/04/2019   BUN 22 07/20/2019   CREATININE 1.13 08/04/2019    Past medical and surgical history were reviewed and updated in EPIC.  Current Meds  Medication Sig  . ACCU-CHEK AVIVA PLUS test strip CHECK BLOOD SUGAR ONCE  DAILY  . allopurinol (ZYLOPRIM) 100 MG tablet Take 1 tablet (100 mg total) by mouth daily.  Marland Kitchen aspirin EC 81 MG tablet Take 1 tablet (81 mg total) by mouth daily.  . Blood Glucose Monitoring Suppl (ACCU-CHEK AVIVA PLUS) w/Device KIT USE TO CHECK BLOOD SUGAR  DAILY AS DIRECTED  . dapagliflozin propanediol (FARXIGA) 10 MG TABS tablet Take 10 mg by mouth daily before breakfast.  . gabapentin (NEURONTIN) 300 MG capsule TAKE 1 CAPSULE BY MOUTH AT BEDTIME FOR  NEUROPATHY  . glipiZIDE (GLUCOTROL XL) 10 MG 24 hr tablet Take 1 tablet (10 mg total) by mouth daily with breakfast.  . ketoconazole (NIZORAL) 2 % shampoo USE ON SCALP TWICE WEEKLY  . lisinopril (ZESTRIL) 5 MG tablet Take 1 tablet (5 mg total) by mouth daily.  . metFORMIN (GLUCOPHAGE) 1000 MG tablet TAKE 1 TABLET TWICE A DAY WITH A MEAL  . nitroGLYCERIN (NITROSTAT) 0.4 MG SL tablet Place 1 tablet (0.4 mg total) under the tongue every 5 (five) minutes as needed. Maximum of 3 doses.  Marland Kitchen omeprazole (PRILOSEC) 40 MG capsule Take 1 capsule (40 mg total) by mouth in the morning and at bedtime.  . pioglitazone (ACTOS) 30 MG tablet Take 1 tablet by mouth once daily  . rosuvastatin (CRESTOR) 10 MG tablet Take 1 tablet (10 mg total) by mouth daily.    Allergies: No known allergies  Social History   Tobacco Use  . Smoking status: Former Smoker    Packs/day: 1.00    Years: 5.00    Pack years: 5.00    Types: Cigarettes    Quit date: 08/06/1972    Years since quitting: 47.4  . Smokeless tobacco: Never Used  Vaping Use  . Vaping Use: Never used  Substance Use Topics  . Alcohol use: Not  Currently    Alcohol/week: 0.0 standard drinks    Comment: 09/26/2016 "quit 01/30/12"  . Drug use: No    Family History  Problem Relation Age of Onset  . Dementia Mother   . Diabetes Mother   . Cancer Father   . Diabetes Sister   . Diabetes Brother   . Heart disease Other     Review of Systems: Mr. Sackmann notes significant dyspepsia in January, which he attributes to drinking wine.  This has resolved with a 38-monthcourse of omeprazole as well as abstinence from alcohol.  Otherwise, a 12-system review of systems was performed and was negative except as noted in the HPI.  --------------------------------------------------------------------------------------------------  Physical Exam: BP 132/80 (BP Location: Left Arm, Patient Position: Sitting, Cuff Size: Normal)   Pulse (!) 54   Ht '6\' 4"'  (1.93 m)   Wt 217 lb 6 oz (98.6 kg)   SpO2 98%   BMI 26.46 kg/m   General: NAD. HEENT: No conjunctival pallor or scleral icterus. Facemask in place. Neck: No JVD or HJR. Lungs: Normal work of breathing. Clear to auscultation bilaterally without wheezes or crackles. Heart: Bradycardic but regular without murmurs, rubs, or gallops. Abd: Bowel sounds present. Soft, NT/ND. Ext:  No lower extremity edema.  EKG: Sinus bradycardia with first-degree AV block (PR interval 218 ms) and inferior Q waves.  Heart rate has decreased since 07/20/2019.  Otherwise, there has been no significant interval change.  Lab Results  Component Value Date   WBC 3.8 (L) 08/04/2019   HGB 14.6 08/04/2019   HCT 44.5 08/04/2019   MCV 94.5 08/04/2019   PLT 186 08/04/2019    Lab Results  Component Value Date   NA 138 08/04/2019   K 5.0 08/04/2019   CL 103 08/04/2019   CO2 25 08/04/2019   BUN 23 08/04/2019   CREATININE 1.13 08/04/2019   GLUCOSE 176 (H) 08/04/2019   ALT 20 11/09/2019    Lab Results  Component Value Date   CHOL 102 11/09/2019   HDL 33 (L) 11/09/2019   LDLCALC 54 11/09/2019   TRIG 75  11/09/2019   CHOLHDL 3.1 11/09/2019    --------------------------------------------------------------------------------------------------  ASSESSMENT AND PLAN: Coronary artery disease: Mr. Appleby denies chest pain and shortness of breath, though it should be noted that fatigue was his anginal equivalent prior to CABG.  He has noticed some progressive fatigue over the last year.  We have therefore agreed to perform an exercise myocardial perfusion stress test for further evaluation.  We will continue aspirin and rosuvastatin for secondary prevention.  Ischemic cardiomyopathy: Mr. Kehoe appears euvolemic with NYHA class II symptoms.  I encouraged him to continue exercising regularly.  Due to suboptimal blood pressure control today, we will increase lisinopril to 10 mg daily, with BMP today and again in about 2 weeks.  Beta-blockade is precluded given baseline bradycardia and first-degree AV block.  Fatigue: Longstanding but more pronounced over the last year.  As above, we will obtain an exercise myocardial perfusion stress test to exclude worsening ischemia as well as to assess chronotropic response.  If stress test is unrevealing and symptoms persist, referral for polysomnography will need to be considered.  Hypertension: Blood pressure slightly above goal today (goal less than 130/80).  We have agreed to increase lisinopril to 10 mg daily, with BMP today and again in 2 weeks.  Hyperlipidemia: LDL at goal on last check in 11/2019.  Continue rosuvastatin 10 mg daily.  Type 2 diabetes mellitus: Recent hemoglobin A1c was above goal at 8.1%.  Encourage Mr. Ganaway to keep working on lifestyle modifications and to continue his current medications.  He should follow-up closely with Dr. Caryn Section.  Follow-up: Return to clinic in 6 months.  Nelva Bush, MD 01/23/2020 8:13 AM

## 2020-01-23 NOTE — Patient Instructions (Addendum)
Medication Instructions:  Your physician has recommended you make the following change in your medication:  1- INCREASE Lisinopril to 10 mg by mouth once a day.  *If you need a refill on your cardiac medications before your next appointment, please call your pharmacy*   Lab Work: 1- Your physician recommends that you return for lab work in: [place].   2- Your physician recommends that you return for lab work in: 2 weeks for BMET.  ~ July 5th. - Please go to the [facility]. You will check in at the front desk to the right as you walk into the atrium. Valet Parking is offered if needed. - No appointment needed. You may go any day between 7 am and 6 pm.  If you have labs (blood work) drawn today and your tests are completely normal, you will receive your results only by: Marland Kitchen MyChart Message (if you have MyChart) OR . A paper copy in the mail If you have any lab test that is abnormal or we need to change your treatment, we will call you to review the results.   Testing/Procedures: Your physician has requested that you have en exercise stress myoview. For further information please visit [email or web address]. Please follow instruction sheet, as given.  [place]  Your caregiver has ordered a Stress Test with nuclear imaging. The purpose of this test is to evaluate the blood supply to your heart muscle. This procedure is referred to as a "Non-Invasive Stress Test." This is because other than having an IV started in your vein, nothing is inserted or "invades" your body. Cardiac stress tests are done to find areas of poor blood flow to the heart by determining the extent of coronary artery disease (CAD). Some patients exercise on a treadmill, which naturally increases the blood flow to your heart, while others who are  unable to walk on a treadmill due to physical limitations have a pharmacologic/chemical stress agent called Lexiscan . This medicine will mimic walking on a treadmill by  temporarily increasing your coronary blood flow.   Please note: these test may take anywhere between 2-4 hours to complete  PLEASE REPORT TO [place] AT THE FIRST DESK WILL DIRECT YOU WHERE TO GO  Date of Procedure:_____________________________________  Arrival Time for Procedure:______________________________  Instructions regarding medication:   __xx_ : Hold diabetes medication morning of procedure - GLIPIZIDE, METFORMIN, ACTOS   PLEASE NOTIFY THE OFFICE AT LEAST 24 HOURS IN ADVANCE IF YOU ARE UNABLE TO KEEP YOUR APPOINTMENT.  [phone] AND  PLEASE NOTIFY NUCLEAR MEDICINE AT [facility] AT LEAST 24 HOURS IN ADVANCE IF YOU ARE UNABLE TO KEEP YOUR APPOINTMENT. [phone]  How to prepare for your Myoview test:  1. Do not eat or drink after midnight 2. No caffeine for 24 hours prior to test 3. No smoking 24 hours prior to test. 4. Your medication may be taken with water.  If your doctor stopped a medication because of this test, do not take that medication. 5. Pants are appropriate. Please wear a short sleeve shirt. 6. No perfume, cologne or lotion. 7. Wear comfortable walking shoes.   Follow-Up: At [facility], you and your health needs are our priority.  As part of our continuing mission to provide you with exceptional heart care, we have created designated Provider Care Teams.  These Care Teams include your primary Cardiologist (physician) and Advanced Practice Providers (APPs -  Physician Assistants and Nurse Practitioners) who all work together to  provide you with the care you need, when you need it.  We recommend signing up for the patient portal called "MyChart".  Sign up information is provided on this After Visit Summary.  MyChart is used to connect with patients for Virtual Visits (Telemedicine).  Patients are able to view lab/test results, encounter notes, upcoming appointments, etc.  Non-urgent messages can be sent to your provider as  well.   To learn more about what you can do with MyChart, go to [email or web address].    Your next appointment:   6 month(s)  The format for your next appointment:   In Person  Provider:    You may see Nelva Bush, MD or one of the following Advanced Practice Providers on your designated Care Team:    Murray Hodgkins, NP  Christell Faith, PA-C  Marrianne Mood, PA-C    Cardiac Nuclear Scan A cardiac nuclear scan is a test that measures blood flow to the heart when a person is resting and when he or she is exercising. The test looks for problems such as:  Not enough blood reaching a portion of the heart.  The heart muscle not working normally. You may need this test if:  You have heart disease.  You have had abnormal lab results.  You have had heart surgery or a balloon procedure to open up blocked arteries (angioplasty).  You have chest pain.  You have shortness of breath. In this test, a radioactive dye (tracer) is injected into your bloodstream. After the tracer has traveled to your heart, an imaging device is used to measure how much of the tracer is absorbed by or distributed to various areas of your heart. This procedure is usually done at a hospital and takes 2-4 hours. Tell a health care provider about:  Any allergies you have.  All medicines you are taking, including vitamins, herbs, eye drops, creams, and over-the-counter medicines.  Any problems you or family members have had with anesthetic medicines.  Any blood disorders you have.  Any surgeries you have had.  Any medical conditions you have.  Whether you are pregnant or may be pregnant. What are the risks? Generally, this is a safe procedure. However, problems may occur, including:  Serious chest pain and heart attack. This is only a risk if the stress portion of the test is done.  Rapid heartbeat.  Sensation of warmth in your chest. This usually passes quickly.  Allergic reaction to the  tracer. What happens before the procedure?  Ask your health care provider about changing or stopping your regular medicines. This is especially important if you are taking diabetes medicines or blood thinners.  Follow instructions from your health care provider about eating or drinking restrictions.  Remove your jewelry on the day of the procedure. What happens during the procedure?  An IV will be inserted into one of your veins.  Your health care provider will inject a small amount of radioactive tracer through the IV.  You will wait for 20-40 minutes while the tracer travels through your bloodstream.  Your heart activity will be monitored with an electrocardiogram (ECG).  You will lie down on an exam table.  Images of your heart will be taken for about 15-20 minutes.  You may also have a stress test. For this test, one of the following may be done: ? You will exercise on a treadmill or stationary bike. While you exercise, your heart's activity will be monitored with an ECG, and your blood pressure will be  checked. ? You will be given medicines that will increase blood flow to parts of your heart. This is done if you are unable to exercise.  When blood flow to your heart has peaked, a tracer will again be injected through the IV.  After 20-40 minutes, you will get back on the exam table and have more images taken of your heart.  Depending on the type of tracer used, scans may need to be repeated 3-4 hours later.  Your IV line will be removed when the procedure is over. The procedure may vary among health care providers and hospitals. What happens after the procedure?  Unless your health care provider tells you otherwise, you may return to your normal schedule, including diet, activities, and medicines.  Unless your health care provider tells you otherwise, you may increase your fluid intake. This will help to flush the contrast dye from your body. Drink enough fluid to keep  your urine pale yellow.  Ask your health care provider, or the department that is doing the test: ? When will my results be ready? ? How will I get my results? Summary  A cardiac nuclear scan measures the blood flow to the heart when a person is resting and when he or she is exercising.  Tell your health care provider if you are pregnant.  Before the procedure, ask your health care provider about changing or stopping your regular medicines. This is especially important if you are taking diabetes medicines or blood thinners.  After the procedure, unless your health care provider tells you otherwise, increase your fluid intake. This will help flush the contrast dye from your body.  After the procedure, unless your health care provider tells you otherwise, you may return to your normal schedule, including diet, activities, and medicines. This information is not intended to replace advice given to you by your health care provider. Make sure you discuss any questions you have with your health care provider. Document Revised: 01/04/2018 Document Reviewed: 01/04/2018 Elsevier Patient Education  [place].

## 2020-01-24 LAB — BASIC METABOLIC PANEL
BUN/Creatinine Ratio: 13 (ref 10–24)
BUN: 15 mg/dL (ref 8–27)
CO2: 22 mmol/L (ref 20–29)
Calcium: 9.8 mg/dL (ref 8.6–10.2)
Chloride: 101 mmol/L (ref 96–106)
Creatinine, Ser: 1.2 mg/dL (ref 0.76–1.27)
GFR calc Af Amer: 67 mL/min/{1.73_m2} (ref >59)
GFR calc non Af Amer: 58 mL/min/{1.73_m2} — ABNORMAL LOW (ref >59)
Glucose: 128 mg/dL — ABNORMAL HIGH (ref 65–99)
Potassium: 5.1 mmol/L (ref 3.5–5.2)
Sodium: 139 mmol/L (ref 134–144)

## 2020-01-30 ENCOUNTER — Other Ambulatory Visit: Payer: Self-pay | Admitting: Family Medicine

## 2020-01-30 DIAGNOSIS — G629 Polyneuropathy, unspecified: Secondary | ICD-10-CM

## 2020-02-01 ENCOUNTER — Telehealth: Payer: Self-pay | Admitting: Internal Medicine

## 2020-02-01 NOTE — Telephone Encounter (Signed)
Patient concerned about the cost of the myoview is going to be $1100 and is it medically necessary per Dr End. He stated he appreciated my knowledge but needs to know what Dr ENd thinks.  Transferred phone call to Dr End.

## 2020-02-01 NOTE — Telephone Encounter (Signed)
I spoke with Stuart Henry regarding his concerns about the cost of upcoming Myoview.  Given his recent fatigue, which was his anginal equivalent prior to CABG, I think it would be important to exclude new ischemia.  We discussed GXT as an alternative, but given some difficult with walking due to knee problems as well as baseline EKG abnormalities, I think Myoview would be more sensitive and specific.  I deferred decision regarding stress testing versus close monitoring without testing to Stuart Henry and he has elected to proceed with tomorrow's Myoview.  Nelva Bush, MD [facility]

## 2020-02-01 NOTE — Telephone Encounter (Signed)
Patient calling  Has a stress test tomorrow morning - has some questions and concerns regarding test Transferred to [facility]

## 2020-02-02 ENCOUNTER — Other Ambulatory Visit
Admission: RE | Admit: 2020-02-02 | Discharge: 2020-02-02 | Disposition: A | Discharge status: Home / Self Care | Payer: Medicare Other | Source: Ambulatory Visit | Attending: Internal Medicine | Admitting: Internal Medicine

## 2020-02-02 ENCOUNTER — Other Ambulatory Visit: Payer: Self-pay

## 2020-02-02 ENCOUNTER — Encounter
Admission: RE | Admit: 2020-02-02 | Discharge: 2020-02-02 | Disposition: A | Discharge status: Home / Self Care | Payer: Medicare Other | Source: Ambulatory Visit | Attending: Internal Medicine | Admitting: Internal Medicine

## 2020-02-02 DIAGNOSIS — R5383 Other fatigue: Secondary | ICD-10-CM | POA: Insufficient documentation

## 2020-02-02 DIAGNOSIS — I2510 Atherosclerotic heart disease of native coronary artery without angina pectoris: Secondary | ICD-10-CM | POA: Insufficient documentation

## 2020-02-02 DIAGNOSIS — I10 Essential (primary) hypertension: Secondary | ICD-10-CM | POA: Insufficient documentation

## 2020-02-02 DIAGNOSIS — Z79899 Other long term (current) drug therapy: Secondary | ICD-10-CM | POA: Diagnosis present

## 2020-02-02 LAB — BASIC METABOLIC PANEL
Anion gap: 11 (ref 5–15)
BUN: 26 mg/dL — ABNORMAL HIGH (ref 8–23)
CO2: 26 mmol/L (ref 22–32)
Calcium: 9.2 mg/dL (ref 8.9–10.3)
Chloride: 99 mmol/L (ref 98–111)
Creatinine, Ser: 1.39 mg/dL — ABNORMAL HIGH (ref 0.61–1.24)
GFR calc Af Amer: 56 mL/min — ABNORMAL LOW (ref >60)
GFR calc non Af Amer: 49 mL/min — ABNORMAL LOW (ref >60)
Glucose, Bld: 235 mg/dL — ABNORMAL HIGH (ref 70–99)
Potassium: 4.9 mmol/L (ref 3.5–5.1)
Sodium: 136 mmol/L (ref 135–145)

## 2020-02-02 MED ORDER — TECHNETIUM TC 99M TETROFOSMIN IV KIT
10.0000 | PACK | Freq: Once | INTRAVENOUS | Status: AC | PRN
  Administered 2020-02-02: 10.668 via INTRAVENOUS

## 2020-02-02 MED ORDER — TECHNETIUM TC 99M TETROFOSMIN IV KIT
30.0000 | PACK | Freq: Once | INTRAVENOUS | Status: AC | PRN
  Administered 2020-02-02: 30.978 via INTRAVENOUS

## 2020-02-02 MED ORDER — REGADENOSON 0.4 MG/5ML IV SOLN: 0.4000 mg | Freq: Once | INTRAVENOUS | Status: DC

## 2020-02-03 LAB — NM MYOCAR MULTI W/SPECT W/WALL MOTION / EF
Estimated workload: 10.1 METS
Exercise duration (min): 8 min
Exercise duration (sec): 25 s
LV dias vol: 89 mL (ref 62–150)
LV sys vol: 37 mL
Peak HR: 134 {beats}/min
Percent HR: 93 %
Rest HR: 72 {beats}/min
SDS: 0
SRS: 2
SSS: 0
TID: 1

## 2020-04-05 ENCOUNTER — Other Ambulatory Visit: Payer: Self-pay | Admitting: Family Medicine

## 2020-04-05 DIAGNOSIS — E1140 Type 2 diabetes mellitus with diabetic neuropathy, unspecified: Secondary | ICD-10-CM

## 2020-04-16 ENCOUNTER — Ambulatory Visit: Payer: Medicare Other | Attending: Critical Care Medicine

## 2020-04-16 ENCOUNTER — Ambulatory Visit: Payer: Self-pay

## 2020-04-16 DIAGNOSIS — Z23 Encounter for immunization: Secondary | ICD-10-CM

## 2020-04-16 NOTE — Progress Notes (Signed)
   Covid-19 Vaccination Clinic  Name:  Stuart Henry    MRN: [id number] DOB: November 09, 1940  04/16/2020  [name] was observed post Covid-19 immunization for 15 minutes without incident. He was provided with Vaccine Information Sheet and instruction to access the V-Safe system.   [name] was instructed to call 911 with any severe reactions post vaccine: Marland Kitchen Difficulty breathing  . Swelling of face and throat  . A fast heartbeat  . A bad rash all over body  . Dizziness and weakness

## 2020-04-18 ENCOUNTER — Other Ambulatory Visit: Payer: Self-pay | Admitting: Family Medicine

## 2020-04-23 ENCOUNTER — Ambulatory Visit: Payer: Medicare Other

## 2020-04-25 ENCOUNTER — Other Ambulatory Visit: Payer: Self-pay | Admitting: Family Medicine

## 2020-04-25 DIAGNOSIS — G629 Polyneuropathy, unspecified: Secondary | ICD-10-CM

## 2020-04-30 ENCOUNTER — Other Ambulatory Visit: Payer: Self-pay

## 2020-04-30 ENCOUNTER — Ambulatory Visit: Payer: Medicare Other | Admitting: Family Medicine

## 2020-04-30 ENCOUNTER — Encounter: Payer: Self-pay | Admitting: Family Medicine

## 2020-04-30 VITALS — BP 124/70 | HR 67 | Temp 97.8°F | Resp 16 | Wt 215.6 lb

## 2020-04-30 DIAGNOSIS — I10 Essential (primary) hypertension: Secondary | ICD-10-CM

## 2020-04-30 DIAGNOSIS — E1165 Type 2 diabetes mellitus with hyperglycemia: Secondary | ICD-10-CM | POA: Diagnosis not present

## 2020-04-30 DIAGNOSIS — E1129 Type 2 diabetes mellitus with other diabetic kidney complication: Secondary | ICD-10-CM | POA: Diagnosis not present

## 2020-04-30 DIAGNOSIS — R350 Frequency of micturition: Secondary | ICD-10-CM | POA: Diagnosis not present

## 2020-04-30 DIAGNOSIS — IMO0002 Reserved for concepts with insufficient information to code with codable children: Secondary | ICD-10-CM

## 2020-04-30 LAB — POCT GLYCOSYLATED HEMOGLOBIN (HGB A1C): Hemoglobin A1C: 7.6 % — AB (ref 4.0–5.6)

## 2020-04-30 MED ORDER — TAMSULOSIN HCL 0.4 MG PO CAPS: 0.4000 mg | ORAL_CAPSULE | Freq: Every evening | ORAL | 3 refills | Status: DC

## 2020-04-30 NOTE — Progress Notes (Signed)
Established patient visit   Patient: Stuart Henry   DOB: August 25, 1940   79 y.o. Male  MRN: [id number] Visit Date: 08/31/2020  Today's healthcare provider: [name], MD   Chief Complaint  Patient presents with  . Diabetes  . Hypertension  . Urinary Frequency        Subjective    HPI HPI    Urinary Frequency    Comments:         Last edited by [name], CMA on 08/31/2020  8:15 AM. (History)      Diabetes Mellitus Type II, Follow-up  Lab Results  Component Value Date   HGBA1C 9.3 (A) 08/31/2020   HGBA1C 7.6 (A) 04/30/2020   HGBA1C 8.1 (A) 12/28/2019   Wt Readings from Last 3 Encounters:  08/31/20 219 lb (99.3 kg)  08/02/20 222 lb (100.7 kg)  04/30/20 215 lb 9.6 oz (97.8 kg)   Last seen for diabetes 04-30-2020.  Management since then includes continue the same medication and counseled on hygiene to reduce or prevent genital yeast infections. He reports good compliance with treatment. However his wife fell and fractured c-spine a few months ago so Mr Bugaj has not been able to follow his regular exercise routine and not eating as healthy as he usually does.  He is not having side effects.  Symptoms: No fatigue No foot ulcerations  No appetite changes No nausea  No paresthesia of the feet  No polydipsia  Yes polyuria No visual disturbances   No vomiting     Home blood sugar records: 160s  Episodes of hypoglycemia? No    Current insulin regiment: none Most Recent Eye Exam: 10/2019 Current exercise: no regular exercise Current diet habits: well balanced  Pertinent Labs: Lab Results  Component Value Date   CHOL 102 11/09/2019   HDL 33 (L) 11/09/2019   LDLCALC 54 11/09/2019   TRIG 75 11/09/2019   CHOLHDL 3.1 11/09/2019   Lab Results  Component Value Date   NA 136 02/02/2020   K 4.9 02/02/2020   CREATININE 1.39 (H) 02/02/2020   GFRNONAA 49 (L) 02/02/2020   GFRAA 56 (L) 02/02/2020   GLUCOSE 235 (H) 02/02/2020       Hypertension, follow-up  BP Readings from Last 3 Encounters:  08/31/20 122/62  08/02/20 132/70  04/30/20 124/70   Wt Readings from Last 3 Encounters:  08/31/20 219 lb (99.3 kg)  08/02/20 222 lb (100.7 kg)  04/30/20 215 lb 9.6 oz (97.8 kg)     He was last seen for hypertension 04-30-2020.  BP at that visit was 124/70. Management since that visit includes continuing the same medications, tamsulosin was added due to urinary hesitancy.  He reports good compliance with treatment. He is not having side effects.  He is following a Regular diet. He is not exercising. He does not smoke.  Use of agents associated with hypertension: none.   Outside blood pressures are not being checked. Symptoms: No chest pain No chest pressure  No palpitations No syncope  No dyspnea No orthopnea  No paroxysmal nocturnal dyspnea No lower extremity edema    The ASCVD Risk score ([place] et al., 2013) failed to calculate for the following reasons:   The valid total cholesterol range is 130 to 320 mg/dL    Follow up for urinary frequency and nocturia  The patient was last seen for this 04-30-2020. Changes made at last visit include adding tamsulosin.  He reports good compliance with treatment. He  feels that condition is Improved. He is not having side effects.       Medications: Outpatient Medications Prior to Visit  Medication Sig  . allopurinol (ZYLOPRIM) 100 MG tablet Take 1 tablet (100 mg total) by mouth daily.  Marland Kitchen aspirin EC 81 MG tablet Take 1 tablet (81 mg total) by mouth daily.  . Blood Glucose Monitoring Suppl (ACCU-CHEK AVIVA PLUS) w/Device KIT USE TO CHECK BLOOD SUGAR  DAILY AS DIRECTED  . FARXIGA 10 MG TABS tablet TAKE 1 TABLET BY MOUTH ONCE DAILY IN THE MORNING BEFORE BREAKFAST  . gabapentin (NEURONTIN) 300 MG capsule TAKE 1 CAPSULE BY MOUTH AT BEDTIME FOR  NEUROPATHY  . glipiZIDE (GLUCOTROL XL) 10 MG 24 hr tablet Take 1 tablet (10 mg total) by mouth daily with breakfast.   . glucose blood (ACCU-CHEK AVIVA PLUS) test strip Use as instructed to check blood sugar once every day for type 2 diabetes E11.9  . ketoconazole (NIZORAL) 2 % shampoo USE ON SCALP TWICE WEEKLY  . metFORMIN (GLUCOPHAGE) 1000 MG tablet TAKE 1 TABLET TWICE A DAY WITH A MEAL  . nitroGLYCERIN (NITROSTAT) 0.4 MG SL tablet Place 1 tablet (0.4 mg total) under the tongue every 5 (five) minutes as needed. Maximum of 3 doses.  Marland Kitchen omeprazole (PRILOSEC) 40 MG capsule Take 1 capsule (40 mg total) by mouth in the morning and at bedtime.  . pioglitazone (ACTOS) 30 MG tablet Take 1 tablet by mouth once daily  . rosuvastatin (CRESTOR) 10 MG tablet Take 1 tablet (10 mg total) by mouth daily.  . tamsulosin (FLOMAX) 0.4 MG CAPS capsule Take 1 capsule (0.4 mg total) by mouth every evening.  Marland Kitchen lisinopril (ZESTRIL) 10 MG tablet Take 1 tablet (10 mg total) by mouth daily.   No facility-administered medications prior to visit.    Review of Systems  Constitutional: Negative.   Respiratory: Negative.   Cardiovascular: Negative.   Gastrointestinal: Negative.   Genitourinary: Positive for frequency.  Neurological: Negative.   Psychiatric/Behavioral: Negative.       Objective    BP 122/62   Pulse (!) 59   Temp 97.7 F (36.5 C)   Resp 16   Ht '6\' 4"'  (1.93 m)   Wt 219 lb (99.3 kg)   BMI 26.66 kg/m    Physical Exam   General appearance:  Overweight male, cooperative and in no acute distress Head: Normocephalic, without obvious abnormality, atraumatic Respiratory: Respirations even and unlabored, normal respiratory rate Extremities: All extremities are intact.  Skin: Skin color, texture, turgor normal. No rashes seen  Psych: Appropriate mood and affect. Neurologic: Mental status: Alert, oriented to person, place, and time, thought content appropriate.  Results for orders placed or performed in visit on 08/31/20  POCT glycosylated hemoglobin (Hb A1C)  Result Value Ref Range   Hemoglobin A1C 9.3 (A)  4.0 - 5.6 %   Est. average glucose Bld gHb Est-mCnc 220   POCT UA - Microalbumin  Result Value Ref Range   Microalbumin Ur, POC 20 mg/L   Creatinine, POC     Albumin/Creatinine Ratio, Urine, POC      Assessment & Plan     1. Type II diabetes mellitus with renal manifestations, uncontrolled (HCC) Much worse due to inability to exercise and poor snacking habits having to care for his wife who is recovering from cervical spine injury. She has now improved to the point that he can leave her by herself to go exercise, and he going to start working on avoiding unhealthy snacks.  Discussed adding GLP-1 agonist if not improved at follow up in about 3 months. Given 6 weeks samples of farxiga 4m today.   No follow-ups on file.      The entirety of the information documented in the History of Present Illness, Review of Systems and Physical Exam were personally obtained by me. Portions of this information were initially documented by the CMA and reviewed by me for thoroughness and accuracy.      DLelon Huh MD  [phone](phone) [phone](fax)  [place]

## 2020-04-30 NOTE — Progress Notes (Signed)
Established patient visit   Patient: Stuart Henry   DOB: 03-Jan-1941   79 y.o. Male  MRN: [id number] Visit Date: 04/30/2020  Today's healthcare provider: [name], MD   Chief Complaint  Patient presents with  . Diabetes  . Hypertension   Subjective    HPI  Diabetes Mellitus Type II, Follow-up  Lab Results  Component Value Date   HGBA1C 8.1 (A) 12/28/2019   HGBA1C 7.5 (A) 09/28/2019   HGBA1C 6.9 (A) 05/27/2019   Wt Readings from Last 3 Encounters:  04/30/20 215 lb 9.6 oz (97.8 kg)  01/23/20 217 lb 6 oz (98.6 kg)  12/28/19 220 lb 9.6 oz (100.1 kg)   Last seen for diabetes 4 months ago.  Management since then includes A1c up today, but home blood sugars are very good. Will continue current medications for now and recheck in 4 months.  . He reports excellent compliance with treatment. He is having side effects. He states he has yeast infection of genitals twice. He is uncircumcised. He states the first infection cleared up quickly with OTC antifungals, but the second infection took much longer, but is just about cleared up now. He also complains of polyuria, nocturia, urinary hesitancy and incomplete bladder emptying.    Home blood sugar records: fasting range: 110-154  Episodes of hypoglycemia? No    Current insulin regiment: none Most Recent Eye Exam: 10/13/19 Current exercise: cardiovascular workout on exercise equipment riding bicycle up to 74miles weekly playing golf.  Current diet habits: working on improving  Pertinent Labs: Lab Results  Component Value Date   CHOL 102 11/09/2019   HDL 33 (L) 11/09/2019   LDLCALC 54 11/09/2019   TRIG 75 11/09/2019   CHOLHDL 3.1 11/09/2019   Lab Results  Component Value Date   NA 136 02/02/2020   K 4.9 02/02/2020   CREATININE 1.39 (H) 02/02/2020   GFRNONAA 49 (L) 02/02/2020   GFRAA 56 (L) 02/02/2020   GLUCOSE 235 (H) 02/02/2020      --------------------------------------------------------------------------------------------------- Hypertension, follow-up  BP Readings from Last 3 Encounters:  04/30/20 124/70  01/23/20 132/80  12/28/19 117/72   Wt Readings from Last 3 Encounters:  04/30/20 215 lb 9.6 oz (97.8 kg)  01/23/20 217 lb 6 oz (98.6 kg)  12/28/19 220 lb 9.6 oz (100.1 kg)     He was last seen for hypertension 4 months ago.  BP at that visit was 117/72. Management since that visit includes no changes, controlled.  He reports excellent compliance with treatment. He is not having side effects.  He is following a Regular diet. He is exercising. He does not smoke.  Use of agents associated with hypertension: NSAIDS.   Outside blood pressures are sytstolic ranging from 329-924 and diastolic in the 26S.  Pertinent labs: Lab Results  Component Value Date   CHOL 102 11/09/2019   HDL 33 (L) 11/09/2019   LDLCALC 54 11/09/2019   TRIG 75 11/09/2019   CHOLHDL 3.1 11/09/2019   Lab Results  Component Value Date   NA 136 02/02/2020   K 4.9 02/02/2020   CREATININE 1.39 (H) 02/02/2020   GFRNONAA 49 (L) 02/02/2020   GFRAA 56 (L) 02/02/2020   GLUCOSE 235 (H) 02/02/2020     The ASCVD Risk score Mikey Bussing DC Jr., et al., 2013) failed to calculate for the following reasons:   The valid total cholesterol range is 130 to 320 mg/dL   ---------------------------------------------------------------------------------------------------     Medications: Outpatient Medications Prior to Visit  Medication  Sig  . ACCU-CHEK AVIVA PLUS test strip CHECK BLOOD SUGAR ONCE  DAILY  . allopurinol (ZYLOPRIM) 100 MG tablet Take 1 tablet (100 mg total) by mouth daily.  Marland Kitchen aspirin EC 81 MG tablet Take 1 tablet (81 mg total) by mouth daily.  . Blood Glucose Monitoring Suppl (ACCU-CHEK AVIVA PLUS) w/Device KIT USE TO CHECK BLOOD SUGAR  DAILY AS DIRECTED  . FARXIGA 10 MG TABS tablet TAKE 1 TABLET BY MOUTH ONCE DAILY IN THE MORNING  BEFORE BREAKFAST  . gabapentin (NEURONTIN) 300 MG capsule TAKE 1 CAPSULE BY MOUTH AT BEDTIME FOR  NEUROPATHY  . glipiZIDE (GLUCOTROL XL) 10 MG 24 hr tablet Take 1 tablet (10 mg total) by mouth daily with breakfast.  . ketoconazole (NIZORAL) 2 % shampoo USE ON SCALP TWICE WEEKLY  . metFORMIN (GLUCOPHAGE) 1000 MG tablet TAKE 1 TABLET TWICE A DAY WITH A MEAL  . nitroGLYCERIN (NITROSTAT) 0.4 MG SL tablet Place 1 tablet (0.4 mg total) under the tongue every 5 (five) minutes as needed. Maximum of 3 doses.  Marland Kitchen omeprazole (PRILOSEC) 40 MG capsule Take 1 capsule (40 mg total) by mouth in the morning and at bedtime.  . pioglitazone (ACTOS) 30 MG tablet Take 1 tablet by mouth once daily  . lisinopril (ZESTRIL) 10 MG tablet Take 1 tablet (10 mg total) by mouth daily.  . rosuvastatin (CRESTOR) 10 MG tablet Take 1 tablet (10 mg total) by mouth daily.   No facility-administered medications prior to visit.    Review of Systems    Objective    BP 124/70   Pulse 67   Temp 97.8 F (36.6 C) (Oral)   Resp 16   Wt 215 lb 9.6 oz (97.8 kg)   SpO2 100%   BMI 26.24 kg/m    Physical Exam  General appearance:  Overweight male, cooperative and in no acute distress Head: Normocephalic, without obvious abnormality, atraumatic Respiratory: Respirations even and unlabored, normal respiratory rate Extremities: All extremities are intact.  Skin: Skin color, texture, turgor normal. No rashes seen  Psych: Appropriate mood and affect. Neurologic: Mental status: Alert, oriented to person, place, and time, thought content appropriate.   No results found for any visits on 04/30/20.  Assessment & Plan     1. Type II diabetes mellitus with renal manifestations ([place]) Much better on current medications which will be continued unchanged.   2. Essential hypertension Well controlled.  Continue current medications.    3. Urinary frequency Likely BPH - tamsulosin (FLOMAX) 0.4 MG CAPS capsule; Take 1 capsule (0.4 mg  total) by mouth every evening.  Dispense: 30 capsule; Refill: 3   He does report two genital yeast infections. He is uncircumcised. Counseled to completely dry foreskin after urinating and may need to apply OTC antifungal creams a few times every week for suppression. Suspect dribbling from BPH may be contributing factor which will hopefulle improve with addition of tamsulosin.    He states he had flu vaccine at [facility].   Future Appointments  Date Time Provider [place]  08/02/2020  8:20 AM End, Harrell Gave, MD [id number]  08/31/2020  8:00 AM Caryn Section Kirstie Peri, MD [facility]         The entirety of the information documented in the History of Present Illness, Review of Systems and Physical Exam were personally obtained by me. Portions of this information were initially documented by the CMA and reviewed by me for thoroughness and accuracy.      [name], MD  [facility]  Practice [phone] (phone) [phone] (fax)  [place]

## 2020-05-02 ENCOUNTER — Other Ambulatory Visit: Payer: Self-pay | Admitting: Family Medicine

## 2020-05-02 DIAGNOSIS — IMO0002 Reserved for concepts with insufficient information to code with codable children: Secondary | ICD-10-CM

## 2020-05-03 MED ORDER — ACCU-CHEK AVIVA PLUS VI STRP: ORAL_STRIP | 3 refills | Status: DC

## 2020-05-03 NOTE — Addendum Note (Signed)
Addended by: Birdie Sons on: 05/03/2020 08:19 AM   Modules accepted: Orders

## 2020-07-04 ENCOUNTER — Other Ambulatory Visit: Payer: Self-pay | Admitting: Family Medicine

## 2020-07-04 DIAGNOSIS — E1140 Type 2 diabetes mellitus with diabetic neuropathy, unspecified: Secondary | ICD-10-CM

## 2020-07-18 ENCOUNTER — Other Ambulatory Visit: Payer: Self-pay | Admitting: Family Medicine

## 2020-07-31 ENCOUNTER — Other Ambulatory Visit: Payer: Self-pay | Admitting: Internal Medicine

## 2020-08-02 ENCOUNTER — Ambulatory Visit (INDEPENDENT_AMBULATORY_CARE_PROVIDER_SITE_OTHER): Payer: Medicare Other | Admitting: Internal Medicine

## 2020-08-02 ENCOUNTER — Other Ambulatory Visit: Payer: Self-pay

## 2020-08-02 ENCOUNTER — Encounter: Payer: Self-pay | Admitting: Internal Medicine

## 2020-08-02 VITALS — BP 132/70 | HR 65 | Ht 76.0 in | Wt 222.0 lb

## 2020-08-02 DIAGNOSIS — I255 Ischemic cardiomyopathy: Secondary | ICD-10-CM | POA: Diagnosis not present

## 2020-08-02 DIAGNOSIS — I10 Essential (primary) hypertension: Secondary | ICD-10-CM | POA: Diagnosis not present

## 2020-08-02 DIAGNOSIS — E785 Hyperlipidemia, unspecified: Secondary | ICD-10-CM

## 2020-08-02 DIAGNOSIS — I2510 Atherosclerotic heart disease of native coronary artery without angina pectoris: Secondary | ICD-10-CM | POA: Diagnosis not present

## 2020-08-02 MED ORDER — ROSUVASTATIN CALCIUM 10 MG PO TABS: 10.0000 mg | ORAL_TABLET | Freq: Every day | ORAL | 0 refills | Status: DC

## 2020-08-02 MED ORDER — TAMSULOSIN HCL 0.4 MG PO CAPS: 0.4000 mg | ORAL_CAPSULE | Freq: Every evening | ORAL | 0 refills | Status: DC

## 2020-08-02 NOTE — Patient Instructions (Signed)
Medication Instructions:  Your physician recommends that you continue on your current medications as directed. Please refer to the Current Medication list given to you today.  *If you need a refill on your cardiac medications before your next appointment, please call your pharmacy*   Lab Work:  None ordered  Testing/Procedures:  None ordered   Follow-Up: At [facility], you and your health needs are our priority.  As part of our continuing mission to provide you with exceptional heart care, we have created designated Provider Care Teams.  These Care Teams include your primary Cardiologist (physician) and Advanced Practice Providers (APPs -  Physician Assistants and Nurse Practitioners) who all work together to provide you with the care you need, when you need it.  We recommend signing up for the patient portal called "MyChart".  Sign up information is provided on this After Visit Summary.  MyChart is used to connect with patients for Virtual Visits (Telemedicine).  Patients are able to view lab/test results, encounter notes, upcoming appointments, etc.  Non-urgent messages can be sent to your provider as well.   To learn more about what you can do with MyChart, go to [email or web address].    Your next appointment:   6 month(s)  The format for your next appointment:   In Person  Provider:   You may see Christopher End, MD or one of the following Advanced Practice Providers on your designated Care Team:   Christopher Berge, NP Ryan Dunn, PA-C Jacquelyn Visser, PA-C Cadence Furth, PA-C Caitlin Walker, NP   

## 2020-08-02 NOTE — Progress Notes (Signed)
Follow-up Outpatient Visit Date: 08/02/2020  Primary Care Provider: Birdie Sons, MD [address] [place] [address]  Chief Complaint: Follow-up CAD  HPI:  Mr. Mcdaniel is a 79 y.o. male with history of coronary artery disease status post CABG (03/3661) complicated by postoperative atrial fibrillation, ischemic cardiomyopathy(LVEF normal by echo in 04/2017), type 2 diabetes mellitus, and hypertension, who presents for follow-up of coronary artery disease and ischemic cardiomyopathy.  I last saw Mr. Elsayed in late June, at which time he reported increased fatigue.  Subsequent myocardial perfusion stress test was low risk without evidence of ischemia or scar.  LVEF was normal.  Today, Mr. Kusek reports that he has been feeling relatively well though he still has some fatigue.  He notes that he has not been as active over the last few months due to health issues with his wife.  Specifically, she was hospitalized at [facility] with heart problems and was found to have a 60% narrowing in one of her coronary arteries and a moderately reduced LVEF.  Stent was not placed at that time.  She subsequently tripped and fell about a month ago and suffered C-spine fractures that required cervical fusion at [facility].  She continues to recover from this.  Mr. Aquilino has not had any chest pain, shortness of breath, palpitations, lightheadedness, or edema.  He rode his bike last weekend and felt fine, though he notes he is not writing as fast as he has in the past.  Home blood pressures are typically less than 130/80.  He was rushing to get to the office this morning and got disoriented in the hospital; he attributes his mildly elevated blood pressure today to this.  --------------------------------------------------------------------------------------------------  Cardiovascular History & Procedures: Cardiovascular Problems:  Coronary artery disease status post CABG (09/2016)  Ischemic  cardiomyopathy  Postoperative atrial fibrillation  Risk Factors:  Known coronary artery disease, diabetes mellitus, hypertension, age greater than 64, and male gender  Cath/PCI:  LHC (09/16/16): Significant two-vessel CAD with 95% proximal LAD stenosis involving ostium of D1, 90% ostial ramus intermedius stenosis, and at least 50% lesions involving the ostium and mid section of the LCx. RCA with 25% distal stenosis. LVEF 55-60% with normal LVEDP.  CV Surgery:  CABG (09/24/16, Dr. Roxan Hockey): LIMA to LAD, SVG to D1, and sequential SVG to first and second branches of ramus intermedius.  EP Procedures and Devices:  None  Non-Invasive Evaluation(s):  Exercise MPI (02/02/2020): Low risk study without ischemia or scar.  LVEF 64%.  Exercise MPI (04/07/2018): Interpreted as intermediate risk with possible apical anterior and apical ischemia and small inferolateral infarct. Normal LVEF. Greater than 1 mm ST depression at stress. I personally reviewed the images and believe that the apical defect represents artifact, as there is normal perfusion in this region on the non-attenuation corrected images. Only perfusion abnormality is a small apical inferior/lateral defect that may represent prior infarct, consistent with a low risk study.  Transthoracic echocardiogram (04/23/17): Normal LV size. LVEF 55-60% with anteroseptal hypokinesis (? related to prior cardiac surgery). Normal diastolic function. Borderline dilated aortic root (3.7 cm). Mild MR. Normal RV size and function. Normal pulmonary artery pressure.  Carotid Doppler (09/23/16): Mild (1-39%) stenoses involving the left and right internal carotid arteries. Vertebral arteries with antegrade flow.  ABIs (09/23/16): Right 1.98 and the left 1.49 suggestive of medial calcification.  Transthoracic echocardiogram (09/19/16): Normal LV size with mildly reduced contraction (EF 45-50%) with basal and mid anterior and anteroseptal hypokinesis.  Grade 1  diastolic dysfunction. Mildly dilated left atrium. Normal RV size and function.   Recent CV Pertinent Labs: Lab Results  Component Value Date   CHOL 102 11/09/2019   CHOL 103 08/18/2018   HDL 33 (L) 11/09/2019   HDL 36 (L) 08/18/2018   LDLCALC 54 11/09/2019   LDLCALC 48 08/18/2018   TRIG 75 11/09/2019   CHOLHDL 3.1 11/09/2019   INR 1.17 09/29/2016   BNP 60.0 11/16/2017   K 4.9 02/02/2020   MG 1.9 09/25/2016   BUN 26 (H) 02/02/2020   BUN 15 01/23/2020   CREATININE 1.39 (H) 02/02/2020    Past medical and surgical history were reviewed and updated in EPIC.  Current Meds  Medication Sig  . allopurinol (ZYLOPRIM) 100 MG tablet Take 1 tablet (100 mg total) by mouth daily.  Marland Kitchen aspirin EC 81 MG tablet Take 1 tablet (81 mg total) by mouth daily.  . Blood Glucose Monitoring Suppl (ACCU-CHEK AVIVA PLUS) w/Device KIT USE TO CHECK BLOOD SUGAR  DAILY AS DIRECTED  . FARXIGA 10 MG TABS tablet TAKE 1 TABLET BY MOUTH ONCE DAILY IN THE MORNING BEFORE BREAKFAST  . gabapentin (NEURONTIN) 300 MG capsule TAKE 1 CAPSULE BY MOUTH AT BEDTIME FOR  NEUROPATHY  . glipiZIDE (GLUCOTROL XL) 10 MG 24 hr tablet Take 1 tablet (10 mg total) by mouth daily with breakfast.  . glucose blood (ACCU-CHEK AVIVA PLUS) test strip Use as instructed to check blood sugar once every day for type 2 diabetes E11.9  . ketoconazole (NIZORAL) 2 % shampoo USE ON SCALP TWICE WEEKLY  . lisinopril (ZESTRIL) 10 MG tablet Take 1 tablet (10 mg total) by mouth daily.  . metFORMIN (GLUCOPHAGE) 1000 MG tablet TAKE 1 TABLET TWICE A DAY WITH A MEAL  . nitroGLYCERIN (NITROSTAT) 0.4 MG SL tablet Place 1 tablet (0.4 mg total) under the tongue every 5 (five) minutes as needed. Maximum of 3 doses.  Marland Kitchen omeprazole (PRILOSEC) 40 MG capsule Take 1 capsule (40 mg total) by mouth in the morning and at bedtime.  . pioglitazone (ACTOS) 30 MG tablet Take 1 tablet by mouth once daily  . [DISCONTINUED] rosuvastatin (CRESTOR) 10 MG tablet Take 1 tablet  by mouth once daily  . [DISCONTINUED] tamsulosin (FLOMAX) 0.4 MG CAPS capsule Take 1 capsule (0.4 mg total) by mouth every evening.    Allergies: No known allergies  Social History   Tobacco Use  . Smoking status: Former Smoker    Packs/day: 1.00    Years: 5.00    Pack years: 5.00    Types: Cigarettes    Quit date: 08/06/1972    Years since quitting: 48.0  . Smokeless tobacco: Never Used  Vaping Use  . Vaping Use: Never used  Substance Use Topics  . Alcohol use: Not Currently    Alcohol/week: 0.0 standard drinks    Comment: 09/26/2016 "quit 01/30/12"  . Drug use: No    Family History  Problem Relation Age of Onset  . Dementia Mother   . Diabetes Mother   . Cancer Father   . Diabetes Sister   . Diabetes Brother   . Heart disease Other     Review of Systems: A 12-system review of systems was performed and was negative except as noted in the HPI.  --------------------------------------------------------------------------------------------------  Physical Exam: BP 132/70 (BP Location: Left Arm, Patient Position: Sitting, Cuff Size: Normal)   Pulse 65   Ht '6\' 4"'  (1.93 m)   Wt 222 lb (100.7 kg)   SpO2 98%   BMI  27.02 kg/m   General: NAD. Neck: No JVD or HJR. Lungs: Clear to auscultation bilateral without wheeze or crackles. Heart: Regular rate and rhythm without murmurs, rubs, or gallops. Abdomen: Soft, nontender, nondistended. Extremities: No lower extremity edema.  EKG: Normal sinus rhythm with first-degree AV block (PR interval 222 ms) and inferior infarct.  Heart rate has increased in 01/23/2020.  Otherwise, there has been no significant interval change.  Lab Results  Component Value Date   WBC 3.8 (L) 08/04/2019   HGB 14.6 08/04/2019   HCT 44.5 08/04/2019   MCV 94.5 08/04/2019   PLT 186 08/04/2019    Lab Results  Component Value Date   NA 136 02/02/2020   K 4.9 02/02/2020   CL 99 02/02/2020   CO2 26 02/02/2020   BUN 26 (H) 02/02/2020   CREATININE  1.39 (H) 02/02/2020   GLUCOSE 235 (H) 02/02/2020   ALT 20 11/09/2019    Lab Results  Component Value Date   CHOL 102 11/09/2019   HDL 33 (L) 11/09/2019   LDLCALC 54 11/09/2019   TRIG 75 11/09/2019   CHOLHDL 3.1 11/09/2019    --------------------------------------------------------------------------------------------------  ASSESSMENT AND PLAN: Coronary artery disease: Mr. Gervasi continues to do well without angina.  His chronic fatigue is stable and not limiting.  Myocardial perfusion stress test this summer was reassuring without evidence of ischemia or scar.  We will plan to continue his current medications for secondary prevention.  Ischemic cardiomyopathy: LVEF has normalized.  Mr. Nolon Bussing has stable NYHA class II symptoms and appears euvolemic on examination today.  We will continue his current dose of lisinopril, though escalation may be necessary if his blood pressure remains elevated at future visits.  We will defer adding a beta-blocker given history of bradycardia and fatigue as well as baseline first-degree AV block.  Hypertension: Blood pressure mildly elevated today (goal less than 130/80).  Home readings are typically better.  I have asked Mr. Nolon Bussing to keep monitoring his blood pressure.  If it is consistently above 130/80, we will need to consider escalation of lisinopril.  Hyperlipidemia: LDL well controlled on last check in April.  We will plan to continue rosuvastatin 10 mg daily.  Repeat lipid panel should be checked with annual labs when Mr. Mckimmy follows up with Dr. Caryn Section.  Follow-up: Return to clinic in 6 months.  Nelva Bush, MD 08/02/2020 8:44 AM

## 2020-08-31 ENCOUNTER — Ambulatory Visit: Payer: Medicare Other | Admitting: Family Medicine

## 2020-08-31 ENCOUNTER — Encounter: Payer: Self-pay | Admitting: Family Medicine

## 2020-08-31 ENCOUNTER — Other Ambulatory Visit: Payer: Self-pay

## 2020-08-31 VITALS — BP 122/62 | HR 59 | Temp 97.7°F | Resp 16 | Ht 76.0 in | Wt 219.0 lb

## 2020-08-31 DIAGNOSIS — IMO0002 Reserved for concepts with insufficient information to code with codable children: Secondary | ICD-10-CM

## 2020-08-31 DIAGNOSIS — E1129 Type 2 diabetes mellitus with other diabetic kidney complication: Secondary | ICD-10-CM | POA: Diagnosis not present

## 2020-08-31 DIAGNOSIS — E1165 Type 2 diabetes mellitus with hyperglycemia: Secondary | ICD-10-CM

## 2020-08-31 LAB — POCT UA - MICROALBUMIN: Microalbumin Ur, POC: 20 mg/L

## 2020-08-31 LAB — POCT GLYCOSYLATED HEMOGLOBIN (HGB A1C)
Est. average glucose Bld gHb Est-mCnc: 220
Hemoglobin A1C: 9.3 % — AB (ref 4.0–5.6)

## 2020-10-05 ENCOUNTER — Other Ambulatory Visit: Payer: Self-pay | Admitting: Family Medicine

## 2020-10-05 DIAGNOSIS — E1140 Type 2 diabetes mellitus with diabetic neuropathy, unspecified: Secondary | ICD-10-CM

## 2020-10-05 NOTE — Telephone Encounter (Signed)
Requested Prescriptions  Pending Prescriptions Disp Refills  . pioglitazone (ACTOS) 30 MG tablet [Pharmacy Med Name: Pioglitazone HCl 30 MG Oral Tablet] 90 tablet 0    Sig: Take 1 tablet by mouth once daily     Endocrinology:  Diabetes - Glitazones - pioglitazone Failed - 10/05/2020  9:38 AM      Failed - HBA1C is between 0 and 7.9 and within 180 days    Hemoglobin A1C  Date Value Ref Range Status  08/31/2020 9.3 (A) 4.0 - 5.6 % Final   Hgb A1c MFr Bld  Date Value Ref Range Status  08/17/2017 6.4 (H) 4.8 - 5.6 % Final    Comment:             Prediabetes: 5.7 - 6.4          Diabetes: >6.4          Glycemic control for adults with diabetes: <7.0          Passed - Valid encounter within last 6 months    Recent Outpatient Visits          1 month ago Type II diabetes mellitus with renal manifestations, uncontrolled ([place])   [facility] Birdie Sons, MD   5 months ago Type II diabetes mellitus with renal manifestations, uncontrolled ([place])   [facility] Birdie Sons, MD   9 months ago Controlled type 2 diabetes with neuropathy [facility])   [facility] Birdie Sons, MD   1 year ago Controlled type 2 diabetes with neuropathy [facility])   [facility] Birdie Sons, MD   1 year ago Gastroesophageal reflux disease with esophagitis without hemorrhage   [place], Clearnce Sorrel, [place]      Future Appointments            In 2 months Fisher, Kirstie Peri, MD [facility], [place]

## 2020-10-15 ENCOUNTER — Other Ambulatory Visit: Payer: Self-pay | Admitting: Family Medicine

## 2020-10-15 DIAGNOSIS — K2100 Gastro-esophageal reflux disease with esophagitis, without bleeding: Secondary | ICD-10-CM

## 2020-10-15 DIAGNOSIS — E790 Hyperuricemia without signs of inflammatory arthritis and tophaceous disease: Secondary | ICD-10-CM

## 2020-10-15 DIAGNOSIS — E1140 Type 2 diabetes mellitus with diabetic neuropathy, unspecified: Secondary | ICD-10-CM

## 2020-10-15 MED ORDER — METFORMIN HCL 1000 MG PO TABS
1000.0000 mg | ORAL_TABLET | Freq: Two times a day (BID) | ORAL | 4 refills | Status: DC
Start: 2020-10-15 — End: 2021-10-21

## 2020-10-15 MED ORDER — OMEPRAZOLE 40 MG PO CPDR
40.0000 mg | DELAYED_RELEASE_CAPSULE | Freq: Two times a day (BID) | ORAL | 4 refills | Status: DC
Start: 2020-10-15 — End: 2020-12-05

## 2020-10-15 MED ORDER — GLIPIZIDE ER 10 MG PO TB24: 10.0000 mg | ORAL_TABLET | Freq: Every day | ORAL | 4 refills | Status: DC

## 2020-10-15 MED ORDER — ALLOPURINOL 100 MG PO TABS: 100.0000 mg | ORAL_TABLET | Freq: Every day | ORAL | 4 refills | Status: DC

## 2020-10-18 ENCOUNTER — Other Ambulatory Visit: Payer: Self-pay | Admitting: Internal Medicine

## 2020-10-23 ENCOUNTER — Ambulatory Visit (INDEPENDENT_AMBULATORY_CARE_PROVIDER_SITE_OTHER): Payer: Medicare Other | Admitting: Family Medicine

## 2020-10-23 ENCOUNTER — Encounter: Payer: Self-pay | Admitting: Family Medicine

## 2020-10-23 DIAGNOSIS — J309 Allergic rhinitis, unspecified: Secondary | ICD-10-CM

## 2020-10-23 DIAGNOSIS — J3489 Other specified disorders of nose and nasal sinuses: Secondary | ICD-10-CM

## 2020-10-23 DIAGNOSIS — R0982 Postnasal drip: Secondary | ICD-10-CM

## 2020-10-23 NOTE — Progress Notes (Signed)
Virtual telephone visit    Virtual Visit via Telephone Note   This visit type was conducted due to national recommendations for restrictions regarding the COVID-19 Pandemic (e.g. social distancing) in an effort to limit this patient's exposure and mitigate transmission in our community. Due to his co-morbid illnesses, this patient is at least at moderate risk for complications without adequate follow up. This format is felt to be most appropriate for this patient at this time. The patient did not have access to video technology or had technical difficulties with video requiring transitioning to audio format only (telephone). Physical exam was limited to content and character of the telephone converstion.     Patient location: home Provider location: [place] involved in the visit: patient, provider  I discussed the limitations of evaluation and management by telemedicine and the availability of in person appointments. The patient expressed understanding and agreed to proceed.   Visit Date: 10/23/2020  Today's healthcare provider: Lavon Paganini, MD   Chief Complaint  Patient presents with  . Sinus Problem  I,Ariana Cavenaugh,acting as a scribe for Lavon Paganini, MD.,have documented all relevant documentation on the behalf of Lavon Paganini, MD,as directed by  Lavon Paganini, MD while in the presence of Lavon Paganini, MD.  Subjective    Sinus Problem This is a new problem. The current episode started in the past 7 days. The problem is unchanged. There has been no fever. His pain is at a severity of 4/10. The pain is mild. Associated symptoms include congestion, a hoarse voice, sinus pressure, sneezing and a sore throat. Pertinent negatives include no chills, coughing, ear pain, headaches or shortness of breath. Past treatments include acetaminophen. The treatment provided mild relief.    x3-4d Sinus pain and pressure, rhinorrhea and  postnasal drip Feels like it may be going into his chest this morning and that worries him Tried sudafed and tylenol (no BP elevation with sudafed) No h/o COPD/asthma Finds that he gets similar symptoms every year around this time, except last 2 years.   Medications: Outpatient Medications Prior to Visit  Medication Sig  . allopurinol (ZYLOPRIM) 100 MG tablet Take 1 tablet (100 mg total) by mouth daily.  Marland Kitchen aspirin EC 81 MG tablet Take 1 tablet (81 mg total) by mouth daily.  . Blood Glucose Monitoring Suppl (ACCU-CHEK AVIVA PLUS) w/Device KIT USE TO CHECK BLOOD SUGAR  DAILY AS DIRECTED  . FARXIGA 10 MG TABS tablet TAKE 1 TABLET BY MOUTH ONCE DAILY IN THE MORNING BEFORE BREAKFAST  . gabapentin (NEURONTIN) 300 MG capsule TAKE 1 CAPSULE BY MOUTH AT BEDTIME FOR  NEUROPATHY  . glipiZIDE (GLUCOTROL XL) 10 MG 24 hr tablet Take 1 tablet (10 mg total) by mouth daily with breakfast.  . glucose blood (ACCU-CHEK AVIVA PLUS) test strip Use as instructed to check blood sugar once every day for type 2 diabetes E11.9  . ketoconazole (NIZORAL) 2 % shampoo USE ON SCALP TWICE WEEKLY  . metFORMIN (GLUCOPHAGE) 1000 MG tablet Take 1 tablet (1,000 mg total) by mouth 2 (two) times daily with a meal.  . nitroGLYCERIN (NITROSTAT) 0.4 MG SL tablet DISSOLVE ONE TABLET UNDER THE TONGUE EVERY 5 MINUTES AS NEEDED FOR CHEST PAIN.  DO NOT EXCEED A TOTAL OF 3 DOSES IN 15 MINUTES  . omeprazole (PRILOSEC) 40 MG capsule Take 1 capsule (40 mg total) by mouth in the morning and at bedtime.  . pioglitazone (ACTOS) 30 MG tablet Take 1 tablet by mouth once daily  .  rosuvastatin (CRESTOR) 10 MG tablet Take 1 tablet (10 mg total) by mouth daily.  . tamsulosin (FLOMAX) 0.4 MG CAPS capsule Take 1 capsule (0.4 mg total) by mouth every evening.  Marland Kitchen lisinopril (ZESTRIL) 10 MG tablet Take 1 tablet (10 mg total) by mouth daily.   No facility-administered medications prior to visit.    Review of Systems  Constitutional: Negative for  appetite change, chills, fatigue and fever.  HENT: Positive for congestion, hoarse voice, postnasal drip, rhinorrhea, sinus pressure, sinus pain, sneezing and sore throat. Negative for ear pain.   Respiratory: Positive for chest tightness. Negative for cough, shortness of breath and wheezing.   Cardiovascular: Negative for chest pain.  Gastrointestinal: Negative for diarrhea, nausea and vomiting.  Neurological: Negative for weakness and headaches.      Objective    There were no vitals taken for this visit.   Talks in full sentences in NAD   Assessment & Plan     1. Allergic rhinitis with postnasal drip - symptoms of rhinorrhea, postnasal drip and sinus pressure that occur annually in the spring - sounds like allergic rhinitis - start H2 blocker without sudafed and flonase  2. Sinus pressure - discussed that this is not bacterial in this time frame and more likely allergic rhinitis as above - cannot r/o early viral infection, however, so recommend Covid testing -If is Covid positive, she will call us back and let us know as he would qualify for outpatient treatment given his advanced age and comorbidities -If Covid negative and taking treatment for allergic rhinitis as above and still symptomatic with having sinus pressure in 1 week, can call back and we will consider antibiotics at that time for possible bacterial sinusitis  Return if symptoms worsen or fail to improve.    I discussed the assessment and treatment plan with the patient. The patient was provided an opportunity to ask questions and all were answered. The patient agreed with the plan and demonstrated an understanding of the instructions.   The patient was advised to call back or seek an in-person evaluation if the symptoms worsen or if the condition fails to improve as anticipated.  I provided 12 minutes of non-face-to-face time during this encounter.  I, Lavon Paganini, MD, have reviewed all documentation for  this visit. The documentation on 10/23/20 for the exam, diagnosis, procedures, and orders are all accurate and complete.   Cathren Sween, Dionne Bucy, MD, MPH [facility]

## 2020-10-31 LAB — HM DIABETES EYE EXAM

## 2020-11-08 ENCOUNTER — Other Ambulatory Visit: Payer: Self-pay | Admitting: Internal Medicine

## 2020-11-09 ENCOUNTER — Other Ambulatory Visit: Payer: Self-pay | Admitting: Internal Medicine

## 2020-11-09 NOTE — Telephone Encounter (Signed)
Rx request sent to pharmacy.  

## 2020-12-03 ENCOUNTER — Other Ambulatory Visit: Payer: Self-pay | Admitting: Family Medicine

## 2020-12-03 NOTE — Telephone Encounter (Signed)
Notes to clinic: Review for refill Script has expired Patient has appt on 12/05/2020   Requested Prescriptions  Pending Prescriptions Disp Refills   FARXIGA 10 MG TABS tablet [Pharmacy Med Name: Farxiga 10 MG Oral Tablet] 90 tablet 0    Sig: TAKE 1 TABLET BY MOUTH ONCE DAILY IN THE MORNING BEFORE BREAKFAST      Endocrinology:  Diabetes - SGLT2 Inhibitors Failed - 12/03/2020 11:20 AM      Failed - Cr in normal range and within 360 days    Creatinine, Ser  Date Value Ref Range Status  02/02/2020 1.39 (H) 0.61 - 1.24 mg/dL Final   Creatinine, POC  Date Value Ref Range Status  11/30/2015 n/a mg/dL Final          Failed - LDL in normal range and within 360 days    LDL Calculated  Date Value Ref Range Status  08/18/2018 48 0 - 99 mg/dL Final   LDL Cholesterol  Date Value Ref Range Status  11/09/2019 54 0 - 99 mg/dL Final    Comment:           Total Cholesterol/HDL:CHD Risk Coronary Heart Disease Risk Table                     Men   Women  1/2 Average Risk   3.4   3.3  Average Risk       5.0   4.4  2 X Average Risk   9.6   7.1  3 X Average Risk  23.4   11.0        Use the calculated Patient Ratio above and the CHD Risk Table to determine the patient's CHD Risk.        ATP III CLASSIFICATION (LDL):  <100     mg/dL   Optimal  100-129  mg/dL   Near or Above                    Optimal  130-159  mg/dL   Borderline  160-189  mg/dL   High  >190     mg/dL   Very High Performed at [facility], [address] [place], [place] [address]           Failed - HBA1C is between 0 and 7.9 and within 180 days    Hemoglobin A1C  Date Value Ref Range Status  08/31/2020 9.3 (A) 4.0 - 5.6 % Final   Hgb A1c MFr Bld  Date Value Ref Range Status  08/17/2017 6.4 (H) 4.8 - 5.6 % Final    Comment:             Prediabetes: 5.7 - 6.4          Diabetes: >6.4          Glycemic control for adults with diabetes: <7.0           Failed - eGFR in normal range and within 360 days     GFR calc Af Amer  Date Value Ref Range Status  02/02/2020 56 (L) >60 mL/min Final   GFR calc non Af Amer  Date Value Ref Range Status  02/02/2020 49 (L) >60 mL/min Final          Passed - Valid encounter within last 6 months    Recent Outpatient Visits           1 month ago Allergic rhinitis with postnasal drip   [facility] [place], [place],  [place]   3 months ago Type II diabetes mellitus with renal manifestations, uncontrolled ([place])   [facility] Birdie Sons, MD   7 months ago Type II diabetes mellitus with renal manifestations, uncontrolled [facility])   [facility] Birdie Sons, MD   11 months ago Controlled type 2 diabetes with neuropathy [facility])   [facility] Birdie Sons, MD   1 year ago Controlled type 2 diabetes with neuropathy [facility])   [facility] Birdie Sons, MD       Future Appointments             In 2 days Fisher, Kirstie Peri, MD [facility], [place]

## 2020-12-04 NOTE — Progress Notes (Signed)
Established patient visit   Patient: Stuart Henry   DOB: July 20, 1941   80 y.o. Male  MRN: [id number] Visit Date: 12/05/2020  Today's healthcare provider: [name], MD   Chief Complaint  Patient presents with  . Back Pain  . Diabetes   Subjective    Back Pain This is a new problem. The current episode started 1 to 4 weeks ago. The problem occurs constantly. The problem has been gradually improving since onset. The pain is present in the lumbar spine. Pertinent negatives include no bladder incontinence, bowel incontinence, dysuria, headaches or weakness. He has tried NSAIDs (Pt was prescribed Methocarbam 560m and Ketorolac 123mat [facility]. ) for the symptoms. The treatment provided mild relief.    Diabetes Mellitus Type II, Follow-up  Lab Results  Component Value Date   HGBA1C 9.3 (A) 08/31/2020   HGBA1C 7.6 (A) 04/30/2020   HGBA1C 8.1 (A) 12/28/2019   Wt Readings from Last 3 Encounters:  12/05/20 229 lb (103.9 kg)  08/31/20 219 lb (99.3 kg)  08/02/20 222 lb (100.7 kg)   Last seen for diabetes 3 months ago.  Management since then includes:Much worse due to inability to exercise and poor snacking habits having to care for his wife who is recovering from cervical spine injury. She has now improved to the point that he can leave her by herself to go exercise, and he going to start working on avoiding unhealthy snacks. Discussed adding GLP-1 agonist if not improved at follow up in about 3 months. Given 6 weeks samples of farxiga [id number]. He reports excellent compliance with treatment. He is not having side effects.  Symptoms: No fatigue No foot ulcerations  No appetite changes No nausea  No paresthesia of the feet  No polydipsia  No polyuria No visual disturbances   No vomiting     Home blood sugar records: fasting range: 130-140's  Episodes of hypoglycemia? No    Current insulin regiment: none Most Recent Eye Exam: within the last year.  Current  exercise: yard work Current diet habits: in general, a "healthy" diet    Pertinent Labs: Lab Results  Component Value Date   CHOL 102 11/09/2019   HDL 33 (L) 11/09/2019   LDLCALC 54 11/09/2019   TRIG 75 11/09/2019   CHOLHDL 3.1 11/09/2019   Lab Results  Component Value Date   NA 136 02/02/2020   K 4.9 02/02/2020   CREATININE 1.39 (H) 02/02/2020   GFRNONAA 49 (L) 02/02/2020   GFRAA 56 (L) 02/02/2020   GLUCOSE 235 (H) 02/02/2020     ---------------------------------------------------------------------------------------------------   Medications: Outpatient Medications Prior to Visit  Medication Sig  . allopurinol (ZYLOPRIM) 100 MG tablet Take 1 tablet (100 mg total) by mouth daily.  . aMarland Kitchenpirin EC 81 MG tablet Take 1 tablet (81 mg total) by mouth daily.  . Blood Glucose Monitoring Suppl (ACCU-CHEK AVIVA PLUS) w/Device KIT USE TO CHECK BLOOD SUGAR  DAILY AS DIRECTED  . FARXIGA 10 MG TABS tablet TAKE 1 TABLET BY MOUTH ONCE DAILY IN THE MORNING BEFORE BREAKFAST  . gabapentin (NEURONTIN) 300 MG capsule TAKE 1 CAPSULE BY MOUTH AT BEDTIME FOR  NEUROPATHY  . glipiZIDE (GLUCOTROL XL) 10 MG 24 hr tablet Take 1 tablet (10 mg total) by mouth daily with breakfast.  . glucose blood (ACCU-CHEK AVIVA PLUS) test strip Use as instructed to check blood sugar once every day for type 2 diabetes E11.9  . ketoconazole (NIZORAL) 2 % shampoo USE ON SCALP  TWICE WEEKLY  . lisinopril (ZESTRIL) 10 MG tablet Take 1 tablet by mouth once daily  . metFORMIN (GLUCOPHAGE) 1000 MG tablet Take 1 tablet (1,000 mg total) by mouth 2 (two) times daily with a meal.  . nitroGLYCERIN (NITROSTAT) 0.4 MG SL tablet DISSOLVE ONE TABLET UNDER THE TONGUE EVERY 5 MINUTES AS NEEDED FOR CHEST PAIN.  DO NOT EXCEED A TOTAL OF 3 DOSES IN 15 MINUTES  . omeprazole (PRILOSEC) 40 MG capsule Take 1 capsule (40 mg total) by mouth in the morning and at bedtime.  . pioglitazone (ACTOS) 30 MG tablet Take 1 tablet by mouth once daily  .  rosuvastatin (CRESTOR) 10 MG tablet Take 1 tablet (10 mg total) by mouth daily.  . tamsulosin (FLOMAX) 0.4 MG CAPS capsule Take 1 capsule (0.4 mg total) by mouth every evening.   No facility-administered medications prior to visit.    Review of Systems  Constitutional: Negative.   Respiratory: Negative.   Cardiovascular: Negative.   Gastrointestinal: Negative.  Negative for bowel incontinence.  Endocrine: Positive for polyuria. Negative for cold intolerance, heat intolerance, polydipsia and polyphagia.  Genitourinary: Negative for bladder incontinence and dysuria.  Musculoskeletal: Positive for back pain. Negative for arthralgias, gait problem, joint swelling, myalgias, neck pain and neck stiffness.  Neurological: Negative for dizziness, weakness, light-headedness and headaches.       Objective    BP (!) 160/80   Pulse (!) 56   Wt 229 lb (103.9 kg)   SpO2 100%   BMI 27.87 kg/m     Physical Exam  General appearance:  Well developed, well nourished male, cooperative and in no acute distress Head: Normocephalic, without obvious abnormality, atraumatic Respiratory: Respirations even and unlabored, normal respiratory rate Extremities: All extremities are intact.  Skin: Skin color, texture, turgor normal. No rashes seen  Psych: Appropriate mood and affect. Neurologic: Mental status: Alert, oriented to person, place, and time, thought content appropriate.   Results for orders placed or performed in visit on 12/05/20  POCT glycosylated hemoglobin (Hb A1C)  Result Value Ref Range   Hemoglobin A1C 7.4 (A) 4.0 - 5.6 %    Assessment & Plan     1. Type 2 diabetes mellitus with complication, without long-term current use of insulin (HCC) Stable. Continue current medications.    2. Ischemic cardiomyopathy Asymptomatic. Compliant with medication.  Continue aggressive risk factor modification.  Follow up Dr. Saunders Revel Q 6 months.   3. Essential hypertension  - Comprehensive metabolic  panel  4. Hyperlipidemia LDL goal <70 He is tolerating rosuvastatin well with no adverse effects.   - Lipid panel  5. Gastroesophageal reflux disease with esophagitis without hemorrhage Was prescribed omeprazole last year. This was refilled lat month, but he states he intended to get tamsulosin refilled and not this medication which he rarely takes. Counseled that this for reflux and heartburn only.   6. Prostate cancer screening  - PSA Total (Reflex To Free) ([facility] only)  7. Benign prostatic hyperplasia with lower urinary tract symptoms, symptom details unspecified refill- tamsulosin (FLOMAX) 0.4 MG CAPS capsule; Take 1 capsule (0.4 mg total) by mouth every evening.  Dispense: 90 capsule; Refill: 4   Future Appointments  Date Time Provider [place]  04/12/2021  8:40 AM Caryn Section Kirstie Peri, MD [facility]         The entirety of the information documented in the History of Present Illness, Review of Systems and Physical Exam were personally obtained by me. Portions of this information were initially documented by  the CMA and reviewed by me for thoroughness and accuracy.      [name], MD  [facility] [phone] (phone) [phone] (fax)  [place]

## 2020-12-05 ENCOUNTER — Other Ambulatory Visit: Payer: Self-pay

## 2020-12-05 ENCOUNTER — Ambulatory Visit: Payer: Medicare Other | Admitting: Family Medicine

## 2020-12-05 ENCOUNTER — Encounter: Payer: Self-pay | Admitting: Family Medicine

## 2020-12-05 VITALS — BP 160/80 | HR 56 | Wt 229.0 lb

## 2020-12-05 DIAGNOSIS — Z125 Encounter for screening for malignant neoplasm of prostate: Secondary | ICD-10-CM

## 2020-12-05 DIAGNOSIS — I10 Essential (primary) hypertension: Secondary | ICD-10-CM

## 2020-12-05 DIAGNOSIS — I255 Ischemic cardiomyopathy: Secondary | ICD-10-CM | POA: Diagnosis not present

## 2020-12-05 DIAGNOSIS — E785 Hyperlipidemia, unspecified: Secondary | ICD-10-CM

## 2020-12-05 DIAGNOSIS — N401 Enlarged prostate with lower urinary tract symptoms: Secondary | ICD-10-CM

## 2020-12-05 DIAGNOSIS — E118 Type 2 diabetes mellitus with unspecified complications: Secondary | ICD-10-CM | POA: Diagnosis not present

## 2020-12-05 DIAGNOSIS — K2100 Gastro-esophageal reflux disease with esophagitis, without bleeding: Secondary | ICD-10-CM

## 2020-12-05 LAB — POCT GLYCOSYLATED HEMOGLOBIN (HGB A1C): Hemoglobin A1C: 7.4 % — AB (ref 4.0–5.6)

## 2020-12-05 MED ORDER — OMEPRAZOLE 40 MG PO CPDR: 40.0000 mg | DELAYED_RELEASE_CAPSULE | Freq: Two times a day (BID) | ORAL | Status: DC

## 2020-12-05 MED ORDER — TAMSULOSIN HCL 0.4 MG PO CAPS: 0.4000 mg | ORAL_CAPSULE | Freq: Every evening | ORAL | 4 refills | Status: DC

## 2020-12-05 NOTE — Patient Instructions (Signed)
.   Please review the attached list of medications and notify my office if there are any errors.   . Please bring all of your medications to every appointment so we can make sure that our medication list is the same as yours.   

## 2020-12-06 LAB — LIPID PANEL
Chol/HDL Ratio: 2.8 ratio (ref 0.0–5.0)
Cholesterol, Total: 113 mg/dL (ref 100–199)
HDL: 41 mg/dL (ref >39)
LDL Chol Calc (NIH): 53 mg/dL (ref 0–99)
Triglycerides: 103 mg/dL (ref 0–149)
VLDL Cholesterol Cal: 19 mg/dL (ref 5–40)

## 2020-12-06 LAB — PSA TOTAL (REFLEX TO FREE): Prostate Specific Ag, Serum: 0.5 ng/mL (ref 0.0–4.0)

## 2020-12-06 LAB — COMPREHENSIVE METABOLIC PANEL
ALT: 16 IU/L (ref 0–44)
AST: 18 IU/L (ref 0–40)
Albumin/Globulin Ratio: 2.1 (ref 1.2–2.2)
Albumin: 4.6 g/dL (ref 3.7–4.7)
Alkaline Phosphatase: 40 IU/L — ABNORMAL LOW (ref 44–121)
BUN/Creatinine Ratio: 18 (ref 10–24)
BUN: 25 mg/dL (ref 8–27)
Bilirubin Total: 0.6 mg/dL (ref 0.0–1.2)
CO2: 24 mmol/L (ref 20–29)
Calcium: 9.1 mg/dL (ref 8.6–10.2)
Chloride: 98 mmol/L (ref 96–106)
Creatinine, Ser: 1.36 mg/dL — ABNORMAL HIGH (ref 0.76–1.27)
Globulin, Total: 2.2 g/dL (ref 1.5–4.5)
Glucose: 161 mg/dL — ABNORMAL HIGH (ref 65–99)
Potassium: 4.7 mmol/L (ref 3.5–5.2)
Sodium: 138 mmol/L (ref 134–144)
Total Protein: 6.8 g/dL (ref 6.0–8.5)
eGFR: 54 mL/min/{1.73_m2} — ABNORMAL LOW (ref >59)

## 2021-01-04 ENCOUNTER — Other Ambulatory Visit: Payer: Self-pay | Admitting: Family Medicine

## 2021-01-04 DIAGNOSIS — E1140 Type 2 diabetes mellitus with diabetic neuropathy, unspecified: Secondary | ICD-10-CM

## 2021-02-01 ENCOUNTER — Ambulatory Visit (INDEPENDENT_AMBULATORY_CARE_PROVIDER_SITE_OTHER): Payer: Medicare Other | Admitting: Internal Medicine

## 2021-02-01 ENCOUNTER — Other Ambulatory Visit: Payer: Self-pay

## 2021-02-01 ENCOUNTER — Encounter: Payer: Self-pay | Admitting: Internal Medicine

## 2021-02-01 VITALS — BP 110/70 | HR 73 | Ht 76.0 in | Wt 223.0 lb

## 2021-02-01 DIAGNOSIS — E785 Hyperlipidemia, unspecified: Secondary | ICD-10-CM | POA: Diagnosis not present

## 2021-02-01 DIAGNOSIS — I255 Ischemic cardiomyopathy: Secondary | ICD-10-CM | POA: Diagnosis not present

## 2021-02-01 DIAGNOSIS — I10 Essential (primary) hypertension: Secondary | ICD-10-CM

## 2021-02-01 DIAGNOSIS — I2510 Atherosclerotic heart disease of native coronary artery without angina pectoris: Secondary | ICD-10-CM | POA: Diagnosis not present

## 2021-02-01 DIAGNOSIS — E118 Type 2 diabetes mellitus with unspecified complications: Secondary | ICD-10-CM

## 2021-02-01 MED ORDER — LISINOPRIL 10 MG PO TABS: 10.0000 mg | ORAL_TABLET | Freq: Every day | ORAL | 3 refills | Status: DC

## 2021-02-01 MED ORDER — ROSUVASTATIN CALCIUM 10 MG PO TABS: 10.0000 mg | ORAL_TABLET | Freq: Every day | ORAL | 3 refills | Status: DC

## 2021-02-01 NOTE — Patient Instructions (Signed)
Medication Instructions:  Your physician recommends that you continue on your current medications as directed. Please refer to the Current Medication list given to you today.  Refills sent in today for your Lisinopril and Rosuvastatin (Crestor) to [facility].   Patient assistance application given today for Faxiga. Please fill out and bring back to our office and we will send in completed form for you.   *If you need a refill on your cardiac medications before your next appointment, please call your pharmacy*   Lab Work:  None ordered  Testing/Procedures:  None ordered   Follow-Up: At [facility], you and your health needs are our priority.  As part of our continuing mission to provide you with exceptional heart care, we have created designated Provider Care Teams.  These Care Teams include your primary Cardiologist (physician) and Advanced Practice Providers (APPs -  Physician Assistants and Nurse Practitioners) who all work together to provide you with the care you need, when you need it.  We recommend signing up for the patient portal called "MyChart".  Sign up information is provided on this After Visit Summary.  MyChart is used to connect with patients for Virtual Visits (Telemedicine).  Patients are able to view lab/test results, encounter notes, upcoming appointments, etc.  Non-urgent messages can be sent to your provider as well.   To learn more about what you can do with MyChart, go to [email or web address].    Your next appointment:   6 month(s)  The format for your next appointment:   In Person  Provider:   You may see Nelva Bush, MD or one of the following Advanced Practice Providers on your designated Care Team:   Murray Hodgkins, NP Christell Faith, PA-C Marrianne Mood, PA-C [place]ence [place], [place] Laurann Montana, NP

## 2021-02-01 NOTE — Progress Notes (Signed)
Follow-up Outpatient Visit Date: 02/01/2021  Primary Care Provider: Birdie Sons, MD [address] [place] [address]  Chief Complaint: Follow-up coronary artery disease and ischemic cardiomyopathy  HPI:  Stuart Henry is a 80 y.o. male with history of coronary artery disease status post CABG (08/6382) complicated by postoperative atrial fibrillation, ischemic cardiomyopathy (LVEF normal by echo in 04/2017), type 2 diabetes mellitus, and hypertension, who presents for follow-up of coronary artery disease and ischemic cardiomyopathy.  I last saw [name] in 07/2020, at which time he noted some continued fatigue but otherwise felt relatively well.  He noted quite a bit of stress surrounding his wife's health problem.  We agreed to defer medication changes or pursue additional testing at that time.  Today, [name] continues to feel well, denying chest pain, shortness of breath, and lightheadedness.  He notes occasional swelling in his calves, usually present when he wears socks that come above his ankles.  He continues to bike regularly without any decline in his functional status.  He mentions today that [name] is quite expensive and wonders if there is a less expensive alternative and if this medication is absolutely necessary.  Hemoglobin A1c had risen to 9.3 in January, which [name] attributes to inactivity and poor diet while he was caring for his wife around the time of her neck issues.  He has improved his diet a lot, with most recent A1c in May having improved to 7.4.  --------------------------------------------------------------------------------------------------  Cardiovascular History & Procedures: Cardiovascular Problems: Coronary artery disease status post CABG (09/2016) Ischemic cardiomyopathy Postoperative atrial fibrillation   Risk Factors: Known coronary artery disease, diabetes mellitus, hypertension, age greater than 59, and male gender    Cath/PCI: LHC (09/16/16): Significant two-vessel CAD with 95% proximal LAD stenosis involving ostium of D1, 90% ostial ramus intermedius stenosis, and at least 50% lesions involving the ostium and mid section of the LCx. RCA with 25% distal stenosis. LVEF 55-60% with normal LVEDP.   CV Surgery: CABG (09/24/16, Dr. Roxan Hockey): LIMA to LAD, SVG to D1, and sequential SVG to first and second branches of ramus intermedius.   EP Procedures and Devices: None   Non-Invasive Evaluation(s): Exercise MPI (02/02/2020): Low risk study without ischemia or scar.  LVEF 64%. Exercise MPI (04/07/2018): Interpreted as intermediate risk with possible apical anterior and apical ischemia and small inferolateral infarct.  Normal LVEF.  Greater than 1 mm ST depression at stress.  I personally reviewed the images and believe that the apical defect represents artifact, as there is normal perfusion in this region on the non-attenuation corrected images.  Only perfusion abnormality is a small apical inferior/lateral defect that may represent prior infarct, consistent with a low risk study. Transthoracic echocardiogram (04/23/17): Normal LV size. LVEF 55-60% with anteroseptal hypokinesis (? related to prior cardiac surgery). Normal diastolic function. Borderline dilated aortic root (3.7 cm). Mild MR. Normal RV size and function. Normal pulmonary artery pressure. Carotid Doppler (09/23/16): Mild (1-39%) stenoses involving the left and right internal carotid arteries. Vertebral arteries with antegrade flow. ABIs (09/23/16): Right 1.98 and the left 1.49 suggestive of medial calcification. Transthoracic echocardiogram (09/19/16): Normal LV size with mildly reduced contraction (EF 45-50%) with basal and mid anterior and anteroseptal hypokinesis. Grade 1 diastolic dysfunction. Mildly dilated left atrium. Normal RV size and function.  Recent CV Pertinent Labs: Lab Results  Component Value Date   CHOL 113 12/05/2020   HDL 41 12/05/2020    LDLCALC 53 12/05/2020   TRIG 103 12/05/2020  CHOLHDL 2.8 12/05/2020   CHOLHDL 3.1 11/09/2019   INR 1.17 09/29/2016   BNP 60.0 11/16/2017   K 4.7 12/05/2020   MG 1.9 09/25/2016   BUN 25 12/05/2020   CREATININE 1.36 (H) 12/05/2020    Past medical and surgical history were reviewed and updated in EPIC.  Current Meds  Medication Sig   allopurinol (ZYLOPRIM) 100 MG tablet Take 1 tablet (100 mg total) by mouth daily.   aspirin EC 81 MG tablet Take 1 tablet (81 mg total) by mouth daily.   Blood Glucose Monitoring Suppl (ACCU-CHEK AVIVA PLUS) w/Device KIT USE TO CHECK BLOOD SUGAR  DAILY AS DIRECTED   FARXIGA 10 MG TABS tablet TAKE 1 TABLET BY MOUTH ONCE DAILY IN THE MORNING BEFORE BREAKFAST   gabapentin (NEURONTIN) 300 MG capsule TAKE 1 CAPSULE BY MOUTH AT BEDTIME FOR  NEUROPATHY   glipiZIDE (GLUCOTROL XL) 10 MG 24 hr tablet Take 1 tablet (10 mg total) by mouth daily with breakfast.   glucose blood (ACCU-CHEK AVIVA PLUS) test strip Use as instructed to check blood sugar once every day for type 2 diabetes E11.9   ketoconazole (NIZORAL) 2 % shampoo USE ON SCALP TWICE WEEKLY   lisinopril (ZESTRIL) 10 MG tablet Take 1 tablet by mouth once daily   metFORMIN (GLUCOPHAGE) 1000 MG tablet Take 1 tablet (1,000 mg total) by mouth 2 (two) times daily with a meal.   nitroGLYCERIN (NITROSTAT) 0.4 MG SL tablet DISSOLVE ONE TABLET UNDER THE TONGUE EVERY 5 MINUTES AS NEEDED FOR CHEST PAIN.  DO NOT EXCEED A TOTAL OF 3 DOSES IN 15 MINUTES   omeprazole (PRILOSEC) 40 MG capsule Take 1 capsule (40 mg total) by mouth in the morning and at bedtime. Take only as needed for heartburn and acid refux   pioglitazone (ACTOS) 30 MG tablet Take 1 tablet by mouth once daily   rosuvastatin (CRESTOR) 10 MG tablet Take 1 tablet (10 mg total) by mouth daily.   tamsulosin (FLOMAX) 0.4 MG CAPS capsule Take 1 capsule (0.4 mg total) by mouth every evening.    Allergies: No known allergies  Social History   Tobacco Use    Smoking status: Former    Packs/day: 1.00    Years: 5.00    Pack years: 5.00    Types: Cigarettes    Quit date: 08/06/1972    Years since quitting: 48.5   Smokeless tobacco: Never  Vaping Use   Vaping Use: Never used  Substance Use Topics   Alcohol use: Yes    Alcohol/week: 2.0 - 3.0 standard drinks    Types: 2 - 3 Glasses of wine per week    Comment: wine in the evenings 4 days per week   Drug use: No    Family History  Problem Relation Age of Onset   Dementia Mother    Diabetes Mother    Cancer Father    Diabetes Sister    Dementia Sister    Diabetes Brother    Heart disease Other     Review of Systems: A 12-system review of systems was performed and was negative except as noted in the HPI.  --------------------------------------------------------------------------------------------------  Physical Exam: BP 110/70 (BP Location: Left Arm, Patient Position: Sitting, Cuff Size: Normal)   Pulse 73   Ht '6\' 4"'  (1.93 m)   Wt 223 lb (101.2 kg)   SpO2 96%   BMI 27.14 kg/m   General:  NAD. Neck: No JVD or HJR. Lungs: Clear to auscultation bilaterally without wheezes or crackles. Heart:  Regular rate and rhythm without murmurs, rubs, or gallops. Abdomen: Soft, nontender, nondistended. Extremities: No lower extremity edema.  EKG:  Normal sinus rhythm with 1st degree AV block (PR interval 224 ms), left axis deviation, and inferior infarct.  QRS axis has shifted to the left since 08/02/2020.  Otherwise, there has been no significant interval change.  Lab Results  Component Value Date   WBC 3.8 (L) 08/04/2019   HGB 14.6 08/04/2019   HCT 44.5 08/04/2019   MCV 94.5 08/04/2019   PLT 186 08/04/2019    Lab Results  Component Value Date   NA 138 12/05/2020   K 4.7 12/05/2020   CL 98 12/05/2020   CO2 24 12/05/2020   BUN 25 12/05/2020   CREATININE 1.36 (H) 12/05/2020   GLUCOSE 161 (H) 12/05/2020   ALT 16 12/05/2020    Lab Results  Component Value Date   CHOL 113  12/05/2020   HDL 41 12/05/2020   LDLCALC 53 12/05/2020   TRIG 103 12/05/2020   CHOLHDL 2.8 12/05/2020    --------------------------------------------------------------------------------------------------  ASSESSMENT AND PLAN: Coronary artery disease: No angina reported with stable functional status (CCS class I).  Continue secondary prevention with aspirin and rosuvastatin.  Ischemic cardiomyopathy: Other than intermittent trace calf edema when wearing longer socks, Mr. Hackman has not had any signs for symptoms or heart failure.  He appears euvolemic today with NYHA class I-II symptoms.  We will continue lisinopril 10 mg daily and Farxiga 10 mg daily.  We have provided him with an application for assistance with his [place] prescription.  If he does not qualify for assistance, I think it is reasonable from a heart failure standpoint to forego treatment with an SGLT-2 inhibitor given preserved LVEF and well-compensated HF.  Hypertension: Blood pressure well-controlled.  Hyperlipidemia: Lipids at goal on last check in May.  Continue current medications.  Type 2 diabetes mellitus: Hemoglobin A1c improved on last check in May and just above goal at 7.4.  Mr. Rostro is concerned about the cost of Farxiga.  As above, we have provided him with the patient assistance application.  He should continue his current medications in the meantime.  If [name] remains cost-prohibitive, he should speak with Dr. Caryn Section about alternative therapies that may be less costly versus consultation with an endocrinologist.  Follow-up: Return to clinic in 6 months.  Nelva Bush, MD 02/01/2021 9:19 AM

## 2021-02-02 ENCOUNTER — Encounter: Payer: Self-pay | Admitting: Internal Medicine

## 2021-03-07 ENCOUNTER — Other Ambulatory Visit: Payer: Self-pay | Admitting: Family Medicine

## 2021-03-27 ENCOUNTER — Other Ambulatory Visit: Payer: Self-pay | Admitting: Family Medicine

## 2021-03-27 DIAGNOSIS — E1140 Type 2 diabetes mellitus with diabetic neuropathy, unspecified: Secondary | ICD-10-CM

## 2021-04-03 ENCOUNTER — Other Ambulatory Visit: Payer: Self-pay | Admitting: Family Medicine

## 2021-04-03 DIAGNOSIS — E1165 Type 2 diabetes mellitus with hyperglycemia: Secondary | ICD-10-CM

## 2021-04-03 DIAGNOSIS — IMO0002 Reserved for concepts with insufficient information to code with codable children: Secondary | ICD-10-CM

## 2021-04-10 ENCOUNTER — Other Ambulatory Visit: Payer: Self-pay

## 2021-04-10 ENCOUNTER — Encounter: Payer: Self-pay | Admitting: Family Medicine

## 2021-04-10 ENCOUNTER — Ambulatory Visit (INDEPENDENT_AMBULATORY_CARE_PROVIDER_SITE_OTHER): Payer: Medicare Other | Admitting: Family Medicine

## 2021-04-10 VITALS — BP 130/66 | HR 58 | Ht 76.0 in | Wt 221.0 lb

## 2021-04-10 DIAGNOSIS — Z23 Encounter for immunization: Secondary | ICD-10-CM

## 2021-04-10 DIAGNOSIS — Z0000 Encounter for general adult medical examination without abnormal findings: Secondary | ICD-10-CM | POA: Diagnosis not present

## 2021-04-10 DIAGNOSIS — E118 Type 2 diabetes mellitus with unspecified complications: Secondary | ICD-10-CM | POA: Diagnosis not present

## 2021-04-10 LAB — POCT GLYCOSYLATED HEMOGLOBIN (HGB A1C): Hemoglobin A1C: 7.6 % — AB (ref 4.0–5.6)

## 2021-04-10 NOTE — Progress Notes (Signed)
Annual Wellness Visit     Patient: Stuart Henry, Male    DOB: 1941/05/10, 80 y.o.   MRN: [id number] Visit Date: 04/10/2021  Today's Provider: [name], MD    Subjective    Stuart Henry is a 80 y.o. male who presents today for his Annual Wellness Visit.   Medications: Outpatient Medications Prior to Visit  Medication Sig   allopurinol (ZYLOPRIM) 100 MG tablet Take 1 tablet (100 mg total) by mouth daily.   aspirin EC 81 MG tablet Take 1 tablet (81 mg total) by mouth daily.   Blood Glucose Monitoring Suppl (ACCU-CHEK AVIVA PLUS) w/Device KIT USE TO CHECK BLOOD SUGAR  DAILY AS DIRECTED   FARXIGA 10 MG TABS tablet TAKE 1 TABLET BY MOUTH ONCE DAILY IN THE MORNING BEFORE BREAKFAST   gabapentin (NEURONTIN) 300 MG capsule TAKE 1 CAPSULE BY MOUTH AT BEDTIME FOR  NEUROPATHY   glipiZIDE (GLUCOTROL XL) 10 MG 24 hr tablet Take 1 tablet (10 mg total) by mouth daily with breakfast.   glucose blood (ACCU-CHEK AVIVA PLUS) test strip USE AS INSTRUCTED TO CHECK  BLOOD SUGAR ONCE DAILY FOR  TYPE 2 DIABETES   ketoconazole (NIZORAL) 2 % shampoo USE ON SCALP TWICE WEEKLY   lisinopril (ZESTRIL) 10 MG tablet Take 1 tablet (10 mg total) by mouth daily.   metFORMIN (GLUCOPHAGE) 1000 MG tablet Take 1 tablet (1,000 mg total) by mouth 2 (two) times daily with a meal.   nitroGLYCERIN (NITROSTAT) 0.4 MG SL tablet DISSOLVE ONE TABLET UNDER THE TONGUE EVERY 5 MINUTES AS NEEDED FOR CHEST PAIN.  DO NOT EXCEED A TOTAL OF 3 DOSES IN 15 MINUTES   omeprazole (PRILOSEC) 40 MG capsule Take 1 capsule (40 mg total) by mouth in the morning and at bedtime. Take only as needed for heartburn and acid refux   pioglitazone (ACTOS) 30 MG tablet Take 1 tablet by mouth once daily   rosuvastatin (CRESTOR) 10 MG tablet Take 1 tablet (10 mg total) by mouth daily.   tamsulosin (FLOMAX) 0.4 MG CAPS capsule Take 1 capsule (0.4 mg total) by mouth every evening.   No facility-administered medications prior to visit.     Allergies  Allergen Reactions   No Known Allergies     Patient Care Team: Birdie Sons, MD as PCP - General (Family Medicine) End, Harrell Gave, MD as PCP - Cardiology (Cardiology) End, Harrell Gave, MD as Consulting Physician (Cardiology) Calvert Cantor, MD as Consulting Physician (Ophthalmology) Lonia Skinner, MD as Consulting Physician (Ophthalmology)    Objective     Most recent functional status assessment: In your present state of health, do you have any difficulty performing the following activities: 04/10/2021  Hearing? N  Vision? N  Difficulty concentrating or making decisions? N  Walking or climbing stairs? N  Dressing or bathing? N  Doing errands, shopping? N  Some recent data might be hidden   Most recent fall risk assessment: Fall Risk  04/10/2021  Falls in the past year? 0  Number falls in past yr: 0  Injury with Fall? 0  Risk for fall due to : No Fall Risks  Follow up Falls evaluation completed    Most recent depression screenings: PHQ 2/9 Scores 04/10/2021 12/05/2020  PHQ - 2 Score 0 0  PHQ- 9 Score 1 1   Most recent cognitive screening: 6CIT Screen 11/14/2016  What Year? 0 points  What month? 0 points  What time? 0 points  Count back from 20 0 points  Months in reverse 0 points  Repeat phrase 2 points  Total Score 2   Most recent Audit-C alcohol use screening Alcohol Use Disorder Test (AUDIT) 04/10/2021  1. How often do you have a drink containing alcohol? 2  2. How many drinks containing alcohol do you have on a typical day when you are drinking? 1  3. How often do you have six or more drinks on one occasion? 1  AUDIT-C Score 4  Alcohol Brief Interventions/Follow-up -   A score of 3 or more in women, and 4 or more in men indicates increased risk for alcohol abuse, EXCEPT if all of the points are from question 1   No results found for any visits on 04/10/21.  Assessment & Plan     Annual wellness visit done today including the all of the  following: Reviewed patient's Family Medical History Reviewed and updated list of patient's medical providers Assessment of cognitive impairment was done Assessed patient's functional ability Established a written schedule for health screening [place] Completed and Reviewed  Exercise Activities and Dietary recommendations  Goals      Decrease soda consumption     Recommend decreasing soda intake from 32 oz to 16 oz a day.     DIET - INCREASE WATER INTAKE     Recommend increasing water intake to 2-4 glasses a day.         Immunization History  Administered Date(s) Administered   Influenza,inj,Quad PF,6+ Mos 05/02/2016   Influenza-Unspecified 05/08/2017, 04/13/2018, 05/04/2019   Moderna Sars-Covid-2 Vaccination 09/06/2019, 10/06/2019, 04/16/2020   Pneumococcal Conjugate-13 03/10/2014   Pneumococcal Polysaccharide-23 08/31/2006, 03/02/2012   Td 08/04/2002   Tdap 03/10/2014   Zoster Recombinat (Shingrix) 04/05/2018, 07/31/2018   Zoster, Live 06/16/2011    Health Maintenance  Topic Date Due   Hepatitis C Screening  Never done   FOOT EXAM  08/17/2018   COVID-19 Vaccine (4 - Booster for Moderna series) 07/16/2020   INFLUENZA VACCINE  11/01/2021 (Originally 03/04/2021)   HEMOGLOBIN A1C  06/07/2021   OPHTHALMOLOGY EXAM  10/31/2021   COLONOSCOPY (Pts 45-40yr Insurance coverage will need to be confirmed)  02/08/2022   TETANUS/TDAP  03/10/2024   PNA vac Low Risk Adult  Completed   Zoster Vaccines- Shingrix  Completed   HPV VACCINES  Aged Out     Discussed health benefits of physical activity, and encouraged him to engage in regular exercise appropriate for his age and condition.        The entirety of the information documented in the History of Present Illness, Review of Systems and Physical Exam were personally obtained by me. Portions of this information were initially documented by the CMA and reviewed by me for thoroughness and accuracy.      DLelon Huh MD  [phone](phone) [phone](fax)  [place]

## 2021-04-10 NOTE — Progress Notes (Signed)
Complete Physical Exam      Patient: Stuart Henry, Male    DOB: 06/29/1941, 80 y.o.   MRN: [id number] Visit Date: 04/10/2021  Today's Provider: [name], MD   Chief Complaint  Patient presents with   Annual Exam   Subjective    Stuart Henry is a 80 y.o. male who presents today for his complete physical examination  He reports consuming a general diet.  Exercises regularly.  He generally feels well. He reports sleeping well. He does have additional problems to discuss today.   He is also here to follow up for diabetes. Last A1c was 7.2. He had some URI since then and felt like sugars were running higher, but not checking regularly. He is tolerating medications well without adverse effects. He would like to change Farxiga to something less expensive.    Medications: Outpatient Medications Prior to Visit  Medication Sig   allopurinol (ZYLOPRIM) 100 MG tablet Take 1 tablet (100 mg total) by mouth daily.   aspirin EC 81 MG tablet Take 1 tablet (81 mg total) by mouth daily.   Blood Glucose Monitoring Suppl (ACCU-CHEK AVIVA PLUS) w/Device KIT USE TO CHECK BLOOD SUGAR  DAILY AS DIRECTED   FARXIGA 10 MG TABS tablet TAKE 1 TABLET BY MOUTH ONCE DAILY IN THE MORNING BEFORE BREAKFAST   gabapentin (NEURONTIN) 300 MG capsule TAKE 1 CAPSULE BY MOUTH AT BEDTIME FOR  NEUROPATHY   glipiZIDE (GLUCOTROL XL) 10 MG 24 hr tablet Take 1 tablet (10 mg total) by mouth daily with breakfast.   glucose blood (ACCU-CHEK AVIVA PLUS) test strip USE AS INSTRUCTED TO CHECK  BLOOD SUGAR ONCE DAILY FOR  TYPE 2 DIABETES   ketoconazole (NIZORAL) 2 % shampoo USE ON SCALP TWICE WEEKLY   lisinopril (ZESTRIL) 10 MG tablet Take 1 tablet (10 mg total) by mouth daily.   metFORMIN (GLUCOPHAGE) 1000 MG tablet Take 1 tablet (1,000 mg total) by mouth 2 (two) times daily with a meal.   nitroGLYCERIN (NITROSTAT) 0.4 MG SL tablet DISSOLVE ONE TABLET UNDER THE TONGUE EVERY 5 MINUTES AS NEEDED FOR CHEST PAIN.  DO NOT  EXCEED A TOTAL OF 3 DOSES IN 15 MINUTES   omeprazole (PRILOSEC) 40 MG capsule Take 1 capsule (40 mg total) by mouth in the morning and at bedtime. Take only as needed for heartburn and acid refux   pioglitazone (ACTOS) 30 MG tablet Take 1 tablet by mouth once daily   rosuvastatin (CRESTOR) 10 MG tablet Take 1 tablet (10 mg total) by mouth daily.   tamsulosin (FLOMAX) 0.4 MG CAPS capsule Take 1 capsule (0.4 mg total) by mouth every evening.   No facility-administered medications prior to visit.    Allergies  Allergen Reactions   No Known Allergies     Patient Care Team: Birdie Sons, MD as PCP - General (Family Medicine) End, Harrell Gave, MD as PCP - Cardiology (Cardiology) End, Harrell Gave, MD as Consulting Physician (Cardiology) Calvert Cantor, MD as Consulting Physician (Ophthalmology) Lonia Skinner, MD as Consulting Physician (Ophthalmology)  Review of Systems  Constitutional: Negative.   HENT: Negative.    Eyes: Negative.   Respiratory: Negative.    Cardiovascular: Negative.   Gastrointestinal: Negative.   Endocrine: Negative.   Genitourinary: Negative.   Musculoskeletal: Negative.   Skin: Negative.   Allergic/Immunologic: Negative.   Neurological: Negative.   Hematological: Negative.   Psychiatric/Behavioral: Negative.        Objective    Vitals: BP 130/66 (BP Location: Right  Arm, Patient Position: Sitting, Cuff Size: Large)   Pulse (!) 58   Ht '6\' 4"'  (1.93 m)   Wt 221 lb (100.2 kg)   BMI 26.90 kg/m    Physical Exam   General Appearance:     Well developed, well nourished male. Alert, cooperative, in no acute distress, appears stated age  Head:    Normocephalic, without obvious abnormality, atraumatic  Eyes:    PERRL, conjunctiva/corneas clear, EOM's intact, fundi    benign, both eyes       Ears:    Normal TM's and external ear canals, both ears  Neck:   Supple, symmetrical, trachea midline, no adenopathy;       thyroid:  No  enlargement/tenderness/nodules; no carotid   bruit or JVD  Back:     Symmetric, no curvature, ROM normal, no CVA tenderness  Lungs:     Clear to auscultation bilaterally, respirations unlabored  Chest wall:    No tenderness or deformity  Heart:    Bradycardic. Normal rhythm. No murmurs, rubs, or gallops.  S1 and S2 normal  Abdomen:     Soft, non-tender, bowel sounds active all four quadrants,    no masses, no organomegaly  Genitalia:    deferred  Rectal:    deferred  Extremities:   All extremities are intact. No cyanosis or edema  Pulses:   2+ and symmetric all extremities  Skin:   Skin color, texture, turgor normal, no rashes or lesions  Lymph nodes:   Cervical, supraclavicular, and axillary nodes normal  Neurologic:   CNII-XII intact. Normal strength, sensation and reflexes      throughout    Results for orders placed or performed in visit on 04/10/21  POCT glycosylated hemoglobin (Hb A1C)  Result Value Ref Range   Hemoglobin A1C 7.6 (A) 4.0 - 5.6 %      Assessment & Plan      1. Annual physical exam Unremarkable exam.   2. Type 2 diabetes mellitus with complication, without long-term current use of insulin ([place]) Stable on current medication regiment. Counseled about lack of less expensive recommended alternatives. He is having to pay over $400 for 3 mos supply of Farxiga, but advised we could supply him with several months of samples over the course of the year. Given 8 weeks supply of samples today.   Future Appointments  Date Time Provider [place]  07/18/2021  9:00 AM End, Harrell Gave, MD [facility]  08/12/2021  8:40 AM Caryn Section, Kirstie Peri, MD [facility]    3. Need for influenza vaccination  - Flu Vaccine QUAD High Dose IM (Fluad)   The entirety of the information documented in the History of Present Illness, Review of Systems and Physical Exam were personally obtained by me. Portions of this information were initially documented by the CMA and reviewed  by me for thoroughness and accuracy.     [name], MD  [facility] [phone] (phone) [phone] (fax)  [place]

## 2021-04-12 ENCOUNTER — Ambulatory Visit: Payer: Medicare Other | Admitting: Family Medicine

## 2021-06-15 ENCOUNTER — Other Ambulatory Visit: Payer: Self-pay | Admitting: Family Medicine

## 2021-06-15 DIAGNOSIS — G629 Polyneuropathy, unspecified: Secondary | ICD-10-CM

## 2021-06-16 NOTE — Telephone Encounter (Signed)
Requested Prescriptions  Pending Prescriptions Disp Refills  . gabapentin (NEURONTIN) 300 MG capsule [Pharmacy Med Name: Gabapentin 300 MG Oral Capsule] 90 capsule 0    Sig: TAKE 1 CAPSULE BY MOUTH AT BEDTIME FOR  NEUROPATHY     Neurology: Anticonvulsants - gabapentin Passed - 06/15/2021  1:24 PM      Passed - Valid encounter within last 12 months    Recent Outpatient Visits          2 months ago Annual physical exam   [facility] Birdie Sons, MD   6 months ago Type 2 diabetes mellitus with complication, without long-term current use of insulin [facility])   [facility] Birdie Sons, MD   7 months ago Allergic rhinitis with postnasal drip   [facility] [place], Dionne Bucy, MD   9 months ago Type II diabetes mellitus with renal manifestations, uncontrolled ([place])   [facility] Birdie Sons, MD   1 year ago Type II diabetes mellitus with renal manifestations, uncontrolled ([place])   [facility] Birdie Sons, MD      Future Appointments            In 1 month End, Harrell Gave, MD [facility]   In 1 month Fisher, Kirstie Peri, MD [facility], [place]

## 2021-07-18 ENCOUNTER — Ambulatory Visit (INDEPENDENT_AMBULATORY_CARE_PROVIDER_SITE_OTHER): Payer: Medicare Other | Admitting: Internal Medicine

## 2021-07-18 ENCOUNTER — Other Ambulatory Visit: Payer: Self-pay

## 2021-07-18 ENCOUNTER — Encounter: Payer: Self-pay | Admitting: Internal Medicine

## 2021-07-18 VITALS — BP 136/86 | HR 72 | Ht 76.0 in | Wt 217.0 lb

## 2021-07-18 DIAGNOSIS — I10 Essential (primary) hypertension: Secondary | ICD-10-CM | POA: Diagnosis not present

## 2021-07-18 DIAGNOSIS — I255 Ischemic cardiomyopathy: Secondary | ICD-10-CM

## 2021-07-18 DIAGNOSIS — E1169 Type 2 diabetes mellitus with other specified complication: Secondary | ICD-10-CM | POA: Diagnosis not present

## 2021-07-18 DIAGNOSIS — E785 Hyperlipidemia, unspecified: Secondary | ICD-10-CM

## 2021-07-18 DIAGNOSIS — I2510 Atherosclerotic heart disease of native coronary artery without angina pectoris: Secondary | ICD-10-CM

## 2021-07-18 MED ORDER — NITROGLYCERIN 0.4 MG SL SUBL: SUBLINGUAL_TABLET | SUBLINGUAL | 1 refills | Status: DC

## 2021-07-18 NOTE — Progress Notes (Signed)
Follow-up Outpatient Visit Date: 07/18/2021  Primary Care Provider: Birdie Sons, MD [address] [place] [address]  Chief Complaint: Follow-up CAD and cardiomyopathy  HPI:  Mr. Broady is a 80 y.o. male with history of coronary artery disease status post CABG (02/6282) complicated by postoperative atrial fibrillation, ischemic cardiomyopathy (LVEF normal by echo in 04/2017), type 2 diabetes mellitus, and hypertension, who presents for follow-up of coronary artery disease and ischemic cardiomyopathy.  I last saw Mr. Schertzer in July, which time he was feeling well.  We did not make any medication changes or pursue additional testing.  Today, Mr. Freilich feels fairly well though he reports having less interest in doing things.  He does not feel frankly depressed but has not been as interested in his usual activities.  He is not able to ride his bike as much due to cold/wet weather.  He is also going to the gym less often.  He continues to play golf.  He recently had to stop playing golf mid round because he felt too cold and stiff.  He has not had frank dyspnea nor chest pain, palpitations, lightheadedness, or edema.  His blood sugars have been running high the last few weeks, though they seem to be improving.  He remains on dapagliflozin despite not having applied for patient assistance yet (he has been getting samples from Dr. Caryn Section to help defray the cost).  --------------------------------------------------------------------------------------------------  Cardiovascular History & Procedures: Cardiovascular Problems: Coronary artery disease status post CABG (09/2016) Ischemic cardiomyopathy Postoperative atrial fibrillation   Risk Factors: Known coronary artery disease, diabetes mellitus, hypertension, age greater than 80, and male gender   Cath/PCI: LHC (09/16/16): Significant two-vessel CAD with 95% proximal LAD stenosis involving ostium of D1, 90% ostial ramus  intermedius stenosis, and at least 50% lesions involving the ostium and mid section of the LCx. RCA with 25% distal stenosis. LVEF 55-60% with normal LVEDP.   CV Surgery: CABG (09/24/16, Dr. Roxan Hockey): LIMA to LAD, SVG to D1, and sequential SVG to first and second branches of ramus intermedius.   EP Procedures and Devices: None   Non-Invasive Evaluation(s): Exercise MPI (02/02/2020): Low risk study without ischemia or scar.  LVEF 64%. Exercise MPI (04/07/2018): Interpreted as intermediate risk with possible apical anterior and apical ischemia and small inferolateral infarct.  Normal LVEF.  Greater than 1 mm ST depression at stress.  I personally reviewed the images and believe that the apical defect represents artifact, as there is normal perfusion in this region on the non-attenuation corrected images.  Only perfusion abnormality is a small apical inferior/lateral defect that may represent prior infarct, consistent with a low risk study. Transthoracic echocardiogram (04/23/17): Normal LV size. LVEF 55-60% with anteroseptal hypokinesis (? related to prior cardiac surgery). Normal diastolic function. Borderline dilated aortic root (3.7 cm). Mild MR. Normal RV size and function. Normal pulmonary artery pressure. Carotid Doppler (09/23/16): Mild (1-39%) stenoses involving the left and right internal carotid arteries. Vertebral arteries with antegrade flow. ABIs (09/23/16): Right 1.98 and the left 1.49 suggestive of medial calcification. Transthoracic echocardiogram (09/19/16): Normal LV size with mildly reduced contraction (EF 45-50%) with basal and mid anterior and anteroseptal hypokinesis. Grade 1 diastolic dysfunction. Mildly dilated left atrium. Normal RV size and function.  Recent CV Pertinent Labs: Lab Results  Component Value Date   CHOL 113 12/05/2020   HDL 41 12/05/2020   LDLCALC 53 12/05/2020   TRIG 103 12/05/2020   CHOLHDL 2.8 12/05/2020   CHOLHDL 3.1 11/09/2019  INR 1.17 09/29/2016    BNP 60.0 11/16/2017   K 4.7 12/05/2020   MG 1.9 09/25/2016   BUN 25 12/05/2020   CREATININE 1.36 (H) 12/05/2020    Past medical and surgical history were reviewed and updated in EPIC.  Current Meds  Medication Sig   allopurinol (ZYLOPRIM) 100 MG tablet Take 1 tablet (100 mg total) by mouth daily.   aspirin EC 81 MG tablet Take 1 tablet (81 mg total) by mouth daily.   Blood Glucose Monitoring Suppl (ACCU-CHEK AVIVA PLUS) w/Device KIT USE TO CHECK BLOOD SUGAR  DAILY AS DIRECTED   FARXIGA 10 MG TABS tablet TAKE 1 TABLET BY MOUTH ONCE DAILY IN THE MORNING BEFORE BREAKFAST   gabapentin (NEURONTIN) 300 MG capsule TAKE 1 CAPSULE BY MOUTH AT BEDTIME FOR  NEUROPATHY   glipiZIDE (GLUCOTROL XL) 10 MG 24 hr tablet Take 1 tablet (10 mg total) by mouth daily with breakfast.   glucose blood (ACCU-CHEK AVIVA PLUS) test strip USE AS INSTRUCTED TO CHECK  BLOOD SUGAR ONCE DAILY FOR  TYPE 2 DIABETES   ketoconazole (NIZORAL) 2 % shampoo USE ON SCALP TWICE WEEKLY   lisinopril (ZESTRIL) 10 MG tablet Take 1 tablet (10 mg total) by mouth daily.   metFORMIN (GLUCOPHAGE) 1000 MG tablet Take 1 tablet (1,000 mg total) by mouth 2 (two) times daily with a meal.   nitroGLYCERIN (NITROSTAT) 0.4 MG SL tablet DISSOLVE ONE TABLET UNDER THE TONGUE EVERY 5 MINUTES AS NEEDED FOR CHEST PAIN.  DO NOT EXCEED A TOTAL OF 3 DOSES IN 15 MINUTES   omeprazole (PRILOSEC) 40 MG capsule Take 1 capsule (40 mg total) by mouth in the morning and at bedtime. Take only as needed for heartburn and acid refux   pioglitazone (ACTOS) 30 MG tablet Take 1 tablet by mouth once daily   rosuvastatin (CRESTOR) 10 MG tablet Take 1 tablet (10 mg total) by mouth daily.   tamsulosin (FLOMAX) 0.4 MG CAPS capsule Take 1 capsule (0.4 mg total) by mouth every evening.    Allergies: No known allergies  Social History   Tobacco Use   Smoking status: Former    Packs/day: 1.00    Years: 5.00    Pack years: 5.00    Types: Cigarettes    Quit date: 08/06/1972     Years since quitting: 48.9   Smokeless tobacco: Never  Vaping Use   Vaping Use: Never used  Substance Use Topics   Alcohol use: Yes    Alcohol/week: 2.0 - 3.0 standard drinks    Types: 2 - 3 Glasses of wine per week    Comment: wine in the evenings 4 days per week   Drug use: No    Family History  Problem Relation Age of Onset   Dementia Mother    Diabetes Mother    Cancer Father    Diabetes Sister    Dementia Sister    Diabetes Brother    Heart disease Other     Review of Systems: A 12-system review of systems was performed and was negative except as noted in the HPI.  --------------------------------------------------------------------------------------------------  Physical Exam: BP 136/86 (BP Location: Left Arm, Patient Position: Sitting, Cuff Size: Normal)    Pulse 72    Ht _0  (1.93 m)    Wt 217 lb (98.4 kg)    SpO2 98%    BMI 26.41 kg/m   General:  NAD. Neck: No JVD or HJR. Lungs: Clear to auscultation bilaterally without wheezes or crackles. Heart: Regular rate  and rhythm without murmurs, rubs, or gallops. Abdomen: Soft, nontender, nondistended. Extremities: No lower extremity edema.  EKG: Normal sinus rhythm with first-degree AV block and inferior infarct.  No significant change from prior tracing on 02/01/2021.  Lab Results  Component Value Date   WBC 3.8 (L) 08/04/2019   HGB 14.6 08/04/2019   HCT 44.5 08/04/2019   MCV 94.5 08/04/2019   PLT 186 08/04/2019    Lab Results  Component Value Date   NA 138 12/05/2020   K 4.7 12/05/2020   CL 98 12/05/2020   CO2 24 12/05/2020   BUN 25 12/05/2020   CREATININE 1.36 (H) 12/05/2020   GLUCOSE 161 (H) 12/05/2020   ALT 16 12/05/2020    Lab Results  Component Value Date   CHOL 113 12/05/2020   HDL 41 12/05/2020   LDLCALC 53 12/05/2020   TRIG 103 12/05/2020   CHOLHDL 2.8 12/05/2020    --------------------------------------------------------------------------------------------------  ASSESSMENT AND  PLAN: Coronary artery disease: No frank anginal symptoms though Mr. Nolon Bussing seems to be slowing down a little bit.  He attributes this largely to being less interested in his usual activities.  His examination today is unremarkable.  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  We will defer further testing and medication changes at this time.  I have encouraged Mr. Nolon Bussing to try exercising more regularly.  Ischemic cardiomyopathy: LVEF preserved by prior echoes and stress tests following CABG.  He appears euvolemic on exam today.  We will continue his current medications.  Hypertension: Blood pressure mildly elevated today but typically better at home, though Mr. Schoeppner does not check his blood pressure on a frequent basis.  We have agreed to defer medication changes at this time, continuing with lisinopril 10 mg daily.  I have encouraged Mr. Nolon Bussing to check his blood pressure at least once a week and to alert [place] if it is consistently above 130/80.  Hyperlipidemia associated with type 2 diabetes mellitus: Lipids well controlled on last check in May.  Continue current dose of rosuvastatin and ongoing management of diabetes mellitus per Dr. Caryn Section.  Follow-up: Return to clinic in 6 months.  Nelva Bush, MD 07/18/2021 9:35 AM

## 2021-07-18 NOTE — Patient Instructions (Signed)
Medication Instructions:  Your physician recommends that you continue on your current medications as directed. Please refer to the Current Medication list given to you today.    *If you need a refill on your cardiac medications before your next appointment, please call your pharmacy*   Lab Work: None ordered  If you have labs (blood work) drawn today and your tests are completely normal, you will receive your results only by: [place] (if you have MyChart) OR A paper copy in the mail If you have any lab test that is abnormal or we need to change your treatment, we will call you to review the results.   Testing/Procedures: None ordered  Follow-Up: At [facility], you and your health needs are our priority.  As part of our continuing mission to provide you with exceptional heart care, we have created designated Provider Care Teams.  These Care Teams include your primary Cardiologist (physician) and Advanced Practice Providers (APPs -  Physician Assistants and Nurse Practitioners) who all work together to provide you with the care you need, when you need it.  We recommend signing up for the patient portal called "MyChart".  Sign up information is provided on this After Visit Summary.  MyChart is used to connect with patients for Virtual Visits (Telemedicine).  Patients are able to view lab/test results, encounter notes, upcoming appointments, etc.  Non-urgent messages can be sent to your provider as well.   To learn more about what you can do with MyChart, go to [email or web address].    Your next appointment:   6 month(s)  The format for your next appointment:   In Person  Provider:   You may see Nelva Bush, MD or one of the following Advanced Practice Providers on your designated Care Team:   Murray Hodgkins, NP Christell Faith, PA-C Cadence Kathlen Mody, [place]

## 2021-08-05 ENCOUNTER — Other Ambulatory Visit: Payer: Self-pay | Admitting: Family Medicine

## 2021-08-12 ENCOUNTER — Ambulatory Visit: Payer: Medicare Other | Admitting: Family Medicine

## 2021-08-12 ENCOUNTER — Encounter: Payer: Self-pay | Admitting: Family Medicine

## 2021-08-12 ENCOUNTER — Other Ambulatory Visit: Payer: Self-pay

## 2021-08-12 VITALS — BP 170/71 | HR 58 | Temp 97.7°F | Wt 223.0 lb

## 2021-08-12 DIAGNOSIS — I2510 Atherosclerotic heart disease of native coronary artery without angina pectoris: Secondary | ICD-10-CM | POA: Diagnosis not present

## 2021-08-12 DIAGNOSIS — E118 Type 2 diabetes mellitus with unspecified complications: Secondary | ICD-10-CM

## 2021-08-12 DIAGNOSIS — I10 Essential (primary) hypertension: Secondary | ICD-10-CM

## 2021-08-12 LAB — POCT GLYCOSYLATED HEMOGLOBIN (HGB A1C): Hemoglobin A1C: 8.7 % — AB (ref 4.0–5.6)

## 2021-08-12 NOTE — Progress Notes (Signed)
°  ° ° °Established patient visit ° ° °Patient: Stuart Henry   DOB: 01/06/1941   80 y.o. Male  MRN: [id number] °Visit Date: 08/12/2021 ° °Today's healthcare provider: Donald Fisher, MD  ° °Chief Complaint  °Patient presents with  ° Hypertension  ° Diabetes  ° °Subjective  °  °HPI  °Diabetes Mellitus Type II, follow-up ° °Lab Results  °Component Value Date  ° HGBA1C 7.6 (A) 04/10/2021  ° HGBA1C 7.4 (A) 12/05/2020  ° HGBA1C 9.3 (A) 08/31/2020  ° °Last seen for diabetes 4 months ago.  °Management since then includes continuing the same treatment. °He reports excellent compliance with treatment. °He is not having side effects.  ° °Home blood sugar records: fasting range: are elevated. ° °Episodes of hypoglycemia? No  °  °Current insulin regiment: None °Most Recent Eye Exam: 10/31/2020 ° °--------------------------------------------------------------------------------------------------- °Hypertension, follow-up ° °BP Readings from Last 3 Encounters:  °08/12/21 (!) 170/71  °07/18/21 136/86  °04/10/21 130/66  ° Wt Readings from Last 3 Encounters:  °08/12/21 223 lb (101.2 kg)  °07/18/21 217 lb (98.4 kg)  °04/10/21 221 lb (100.2 kg)  °  ° °He was last seen for hypertension 7 months ago.  °BP at that visit was 160/80. °Management since that visit includes no changes. °He reports excellent compliance with treatment. °He is not having side effects.  °He is exercising. °He is adherent to low salt diet.   °Outside blood pressures are not being checked. ° °He does not smoke. ° °Use of agents associated with hypertension: none.  ° °--------------------------------------------------------------------------------------------------- °Lipid/Cholesterol, follow-up ° °Last Lipid Panel: °Lab Results  °Component Value Date  ° CHOL 113 12/05/2020  ° LDLCALC 53 12/05/2020  ° HDL 41 12/05/2020  ° TRIG 103 12/05/2020  ° ° °He was last seen for this 7 months ago.  °Management since that visit includes no changes. ° °He reports excellent  compliance with treatment. °He is not having side effects.  ° °Symptoms: °No appetite changes No foot ulcerations  °No chest pain No chest pressure/discomfort  °No dyspnea No orthopnea  °No fatigue No lower extremity edema  °No palpitations No paroxysmal nocturnal dyspnea  °No nausea No numbness or tingling of extremity  °No polydipsia No polyuria  °No speech difficulty No syncope  ° ° °Last metabolic panel °Lab Results  °Component Value Date  ° GLUCOSE 161 (H) 12/05/2020  ° NA 138 12/05/2020  ° K 4.7 12/05/2020  ° BUN 25 12/05/2020  ° CREATININE 1.36 (H) 12/05/2020  ° EGFR 54 (L) 12/05/2020  ° GFRNONAA 49 (L) 02/02/2020  ° CALCIUM 9.1 12/05/2020  ° AST 18 12/05/2020  ° ALT 16 12/05/2020  ° °The ASCVD Risk score (Arnett DK, et al., 2019) failed to calculate for the following reasons: °  The valid total cholesterol range is 130 to 320 mg/dL ° °---------------------------------------------------------------------------------------------------  ° °Medications: °Outpatient Medications Prior to Visit  °Medication Sig  ° allopurinol (ZYLOPRIM) 100 MG tablet Take 1 tablet (100 mg total) by mouth daily.  ° aspirin EC 81 MG tablet Take 1 tablet (81 mg total) by mouth daily.  ° Blood Glucose Monitoring Suppl (ACCU-CHEK AVIVA PLUS) w/Device KIT USE TO CHECK BLOOD SUGAR  DAILY AS DIRECTED  ° FARXIGA 10 MG TABS tablet TAKE 1 TABLET BY MOUTH ONCE DAILY IN THE MORNING BEFORE BREAKFAST  ° gabapentin (NEURONTIN) 300 MG capsule TAKE 1 CAPSULE BY MOUTH AT BEDTIME FOR  NEUROPATHY  ° glipiZIDE (GLUCOTROL XL) 10 MG 24 hr tablet Take 1 tablet (10 mg total)   total) by mouth daily with breakfast.   glucose blood (ACCU-CHEK AVIVA PLUS) test strip USE AS INSTRUCTED TO CHECK  BLOOD SUGAR ONCE DAILY FOR  TYPE 2 DIABETES   ketoconazole (NIZORAL) 2 % shampoo USE ON SCALP TWICE WEEKLY   lisinopril (ZESTRIL) 10 MG tablet Take 1 tablet (10 mg total) by mouth daily.   metFORMIN (GLUCOPHAGE) 1000 MG tablet Take 1 tablet (1,000 mg total) by mouth 2 (two)  times daily with a meal.   nitroGLYCERIN (NITROSTAT) 0.4 MG SL tablet DISSOLVE ONE TABLET UNDER THE TONGUE EVERY 5 MINUTES AS NEEDED FOR CHEST PAIN.  DO NOT EXCEED A TOTAL OF 3 DOSES IN 15 MINUTES   omeprazole (PRILOSEC) 40 MG capsule Take 1 capsule (40 mg total) by mouth in the morning and at bedtime. Take only as needed for heartburn and acid refux   pioglitazone (ACTOS) 30 MG tablet Take 1 tablet by mouth once daily   rosuvastatin (CRESTOR) 10 MG tablet Take 1 tablet (10 mg total) by mouth daily.   tamsulosin (FLOMAX) 0.4 MG CAPS capsule Take 1 capsule (0.4 mg total) by mouth every evening.   No facility-administered medications prior to visit.    Review of Systems  Constitutional: Negative.   Respiratory: Negative.    Cardiovascular: Negative.   Gastrointestinal: Negative.   Endocrine: Negative.   Neurological:  Negative for dizziness, light-headedness and headaches.      Objective    BP (!) 170/71 (BP Location: Right Arm, Patient Position: Sitting, Cuff Size: Large)    Pulse (!) 58    Temp 97.7 F (36.5 C) (Oral)    Wt 223 lb (101.2 kg)    SpO2 100%    BMI 27.14 kg/m    Physical Exam  General appearance:  Overweight male, cooperative and in no acute distress Head: Normocephalic, without obvious abnormality, atraumatic Respiratory: Respirations even and unlabored, normal respiratory rate Extremities: All extremities are intact.  Skin: Skin color, texture, turgor normal. No rashes seen  Psych: Appropriate mood and affect. Neurologic: Mental status: Alert, oriented to person, place, and time, thought content appropriate.   Results for orders placed or performed in visit on 08/12/21  POCT glycosylated hemoglobin (Hb A1C)  Result Value Ref Range   Hemoglobin A1C 8.7 (A) 4.0 - 5.6 %    Assessment & Plan     1. Type 2 diabetes mellitus with complication, without long-term current use of insulin (HCC) A1c up today likely due to not following his usual diet and exercise  regiments over the holidays. Continue current medications.  Encouraged to start back on his usual diet and exercise regiment. Given 6 weeks samples [place].   2. Essential hypertension Usually much better controlled. He is just getting over URI and has not been following diet very well. Expect this to improve with the next follow up in 3 months.   3. Coronary artery disease involving native coronary artery of native heart without angina pectoris Asymptomatic. Compliant with medication.  Continue aggressive risk factor modification.  Continue routine follow up with Dr.  Saunders Revel  Future Appointments  Date Time Provider [place]  11/22/2021  9:40 AM Birdie Sons, MD [facility]  01/17/2022  9:20 AM End, Harrell Gave, MD [id number]         The entirety of the information documented in the History of Present Illness, Review of Systems and Physical Exam were personally obtained by me. Portions of this information were initially documented by the CMA and reviewed by me for thoroughness  accuracy.   ° ° °Donald Fisher, MD  °[facility] °[phone] (phone) °[phone] (fax) ° °[facility] ° °

## 2021-08-29 ENCOUNTER — Telehealth: Payer: Self-pay | Admitting: Internal Medicine

## 2021-08-29 NOTE — Telephone Encounter (Signed)
Patient dropped PAF placed in box 

## 2021-09-05 NOTE — Telephone Encounter (Signed)
MD portion completed and signed.  °Faxed completed PAF form for Farxiga to [employer] @ [phone].  °Confirmation received.  °Paperwork placed in designated file cabinet.  °

## 2021-09-09 ENCOUNTER — Other Ambulatory Visit: Payer: Self-pay | Admitting: Family Medicine

## 2021-09-09 DIAGNOSIS — L219 Seborrheic dermatitis, unspecified: Secondary | ICD-10-CM

## 2021-09-09 NOTE — Telephone Encounter (Signed)
Copied from [place] [id number]. Topic: Quick Communication - Rx Refill/Question >> Sep 09, 2021  8:52 AM Loma Boston wrote: Medication: ketoconazole (NIZORAL) 2 % shampoo 120 mL 4 06/01/2019   Sig: USE ON SCALP TWICE WEEKLY  Sent to pharmacy as: ketoconazole (NIZORAL) 2 % shampoo  E-Prescribing Status: Receipt confirmed by pharmacy (06/01/2019 11:57 AM EDT)     Has the patient contacted their pharmacy? Yes.   (Agent: If no, request that the patient contact the pharmacy for the refill. If patient does not wish to contact the pharmacy document the reason why and proceed with request.) (Agent: If yes, when and what did the pharmacy advise?) call [facility] [address] [place] [address] Phone: [phone] Fax: [phone] Hours: Not open 24 hours   Has the patient been seen for an appointment in the last year OR does the patient have an upcoming appointment? Yes.  08/12/21  Agent: Please be advised that RX refills may take up to 3 business days. We ask that you follow-up with your pharmacy.

## 2021-09-10 MED ORDER — KETOCONAZOLE 2 % EX SHAM: MEDICATED_SHAMPOO | CUTANEOUS | 4 refills | Status: DC

## 2021-09-10 NOTE — Telephone Encounter (Signed)
Requested medications are due for refill today.  Unsure  Requested medications are on the active medications list.  yes  Last refill. 06/01/2019  Future visit scheduled.   yes  Notes to clinic.  Medication not delegated. Last refilled 06/01/2019    Requested Prescriptions  Pending Prescriptions Disp Refills   ketoconazole (NIZORAL) 2 % shampoo 120 mL 4     Not Delegated - Over the Counter: OTC 2 Failed - 09/09/2021 10:57 AM      Failed - This refill cannot be delegated      Passed - Valid encounter within last 12 months    Recent Outpatient Visits           4 weeks ago Type 2 diabetes mellitus with complication, without long-term current use of insulin [facility])   [facility] Birdie Sons, MD   5 months ago Annual physical exam   [facility] Birdie Sons, MD   9 months ago Type 2 diabetes mellitus with complication, without long-term current use of insulin [facility])   [facility] Birdie Sons, MD   10 months ago Allergic rhinitis with postnasal drip   [facility] [place], Dionne Bucy, MD   1 year ago Type II diabetes mellitus with renal manifestations, uncontrolled [facility])   [facility] Birdie Sons, MD       Future Appointments             In 2 months Fisher, Kirstie Peri, MD [facility], [place]   In 4 months End, Harrell Gave, MD [facility], [place]

## 2021-09-13 NOTE — Telephone Encounter (Signed)
Called and spoke to pt to make aware we received fax that his [name] PAF application was denied.  Denial reason was that pt's insurance may cover medication.  Pt's PCP prescribes [name] for DM management.   Pt states that he has already spoken with [employer] re denial.  Pt does know that his insurance covers medications but his OOP expense the first two months are >$400 and then ~$300. Then pt's cost decreases.   Notified pt I cannot do anything further since his PCP would handle prior auth. And pt confirms that prior auth has been completed by PCP.   Pt appreciative of attempt for PAF. Pt has no further needs at this time.  Pt will continue Farxiga as prescribed.

## 2021-10-19 ENCOUNTER — Other Ambulatory Visit: Payer: Self-pay | Admitting: Family Medicine

## 2021-10-19 DIAGNOSIS — E1140 Type 2 diabetes mellitus with diabetic neuropathy, unspecified: Secondary | ICD-10-CM

## 2021-11-06 LAB — HM DIABETES EYE EXAM

## 2021-11-08 ENCOUNTER — Telehealth: Payer: Self-pay | Admitting: Family Medicine

## 2021-11-08 NOTE — Telephone Encounter (Signed)
Patient advised.

## 2021-11-08 NOTE — Telephone Encounter (Signed)
Patient lost his rosuvastatin (CRESTOR) 10 MG tablet and looked everyone. Pharmacy states he can pay out of pocket cost of 100 plus dollars or he would have to wait until 11/27/2021 to refill. Patient would like to know if PCP has samples, please advise ?

## 2021-11-08 NOTE — Telephone Encounter (Signed)
No, we don't have any samples. ?

## 2021-11-22 ENCOUNTER — Encounter: Payer: Self-pay | Admitting: Family Medicine

## 2021-11-22 ENCOUNTER — Ambulatory Visit: Payer: Medicare Other | Admitting: Family Medicine

## 2021-11-22 VITALS — BP 128/73 | HR 69 | Temp 98.4°F | Resp 16 | Wt 214.8 lb

## 2021-11-22 DIAGNOSIS — E785 Hyperlipidemia, unspecified: Secondary | ICD-10-CM

## 2021-11-22 DIAGNOSIS — E118 Type 2 diabetes mellitus with unspecified complications: Secondary | ICD-10-CM

## 2021-11-22 DIAGNOSIS — E1169 Type 2 diabetes mellitus with other specified complication: Secondary | ICD-10-CM

## 2021-11-22 NOTE — Progress Notes (Signed)
?  ? ? ?Established patient visit ? ? ?Patient: Stuart Henry   DOB: [birth year]   81 y.o. Male  MRN: [id number] ?Visit Date: 11/22/2021 ? ?Today's healthcare provider: [name], MD  ? ?Chief Complaint  ?Patient presents with  ? follow-up DM and HTN  ? ?Subjective  ?  ?HPI  ?Diabetes Mellitus Type II, Follow-up ? ?Lab Results  ?Component Value Date  ? HGBA1C 8.7 (A) 08/12/2021  ? HGBA1C 7.6 (A) 04/10/2021  ? HGBA1C 7.4 (A) 12/05/2020  ? ?Wt Readings from Last 3 Encounters:  ?11/22/21 214 lb 12.8 oz (97.4 kg)  ?08/12/21 223 lb (101.2 kg)  ?07/18/21 217 lb (98.4 kg)  ? ?Last seen for diabetes 3 months ago.  ?Management since then includes  advising patient to continue current medications.  Encouraged to start back on his usual diet and exercise regiment. Given 6 weeks samples [place].  ?He reports excellent compliance with treatment. ?He is not having side effects.  ?Symptoms: ?No fatigue No foot ulcerations  ?No appetite changes No nausea  ?Yes paresthesia of the feet  No polydipsia  ?No polyuria No visual disturbances   ?No vomiting   ? ? ?Home blood sugar records: fasting range: 100-160  in March it was 160-190's ? ?Episodes of hypoglycemia? No  ?  ?Current insulin regiment: none ?Most Recent Eye Exam: 11/06/2021 ?Current exercise: golf and goes to the [employer] ?Current diet habits: well balanced ? ?Pertinent Labs: ?Lab Results  ?Component Value Date  ? CHOL 113 12/05/2020  ? HDL 41 12/05/2020  ? LDLCALC 53 12/05/2020  ? TRIG 103 12/05/2020  ? CHOLHDL 2.8 12/05/2020  ? Lab Results  ?Component Value Date  ? NA 138 12/05/2020  ? K 4.7 12/05/2020  ? CREATININE 1.36 (H) 12/05/2020  ? EGFR 54 (L) 12/05/2020  ? MICROALBUR 20 08/31/2020  ?  ? ?---------------------------------------------------------------------------------------------------  ? ?Hypertension, follow-up ? ?BP Readings from Last 3 Encounters:  ?11/22/21 128/73  ?08/12/21 (!) 170/71  ?07/18/21 136/86  ? Wt Readings from Last 3 Encounters:  ?11/22/21  214 lb 12.8 oz (97.4 kg)  ?08/12/21 223 lb (101.2 kg)  ?07/18/21 217 lb (98.4 kg)  ?  ? ?He was last seen for hypertension 3 months ago.  ?BP at that visit was 170/71. Management since that visit includes continue same medications. ? ?He reports excellent compliance with treatment. ?He is not having side effects.  ?He is following a Regular/well balanced diet. ?He is exercising. ?He does not smoke. ? ?Use of agents associated with hypertension: NSAIDS.  ? ?Outside blood pressures are not being checked. ?Symptoms: ?No chest pain No chest pressure  ?No palpitations No syncope  ?No dyspnea No orthopnea  ?No paroxysmal nocturnal dyspnea No lower extremity edema  ? ? ?---------------------------------------------------------------------------------------------------  ? ?Medications: ?Outpatient Medications Prior to Visit  ?Medication Sig  ? allopurinol (ZYLOPRIM) 100 MG tablet Take 1 tablet (100 mg total) by mouth daily.  ? aspirin EC 81 MG tablet Take 1 tablet (81 mg total) by mouth daily.  ? Blood Glucose Monitoring Suppl (ACCU-CHEK AVIVA PLUS) w/Device KIT USE TO CHECK BLOOD SUGAR  DAILY AS DIRECTED  ? FARXIGA 10 MG TABS tablet TAKE 1 TABLET BY MOUTH ONCE DAILY IN THE MORNING BEFORE BREAKFAST  ? gabapentin (NEURONTIN) 300 MG capsule TAKE 1 CAPSULE BY MOUTH AT BEDTIME FOR  NEUROPATHY  ? glipiZIDE (GLUCOTROL XL) 10 MG 24 hr tablet Take 1 tablet (10 mg total) by mouth daily with breakfast.  ? glucose blood (ACCU-CHEK AVIVA PLUS) test  strip USE AS INSTRUCTED TO CHECK  BLOOD SUGAR ONCE DAILY FOR  TYPE 2 DIABETES  ? ketoconazole (NIZORAL) 2 % shampoo USE ON SCALP TWICE WEEKLY  ? lisinopril (ZESTRIL) 10 MG tablet Take 1 tablet (10 mg total) by mouth daily.  ? metFORMIN (GLUCOPHAGE) 1000 MG tablet TAKE 1 TABLET BY MOUTH TWICE DAILY WITH MEALS  ? nitroGLYCERIN (NITROSTAT) 0.4 MG SL tablet DISSOLVE ONE TABLET UNDER THE TONGUE EVERY 5 MINUTES AS NEEDED FOR CHEST PAIN.  DO NOT EXCEED A TOTAL OF 3 DOSES IN 15 MINUTES  ? omeprazole  (PRILOSEC) 40 MG capsule Take 1 capsule (40 mg total) by mouth in the morning and at bedtime. Take only as needed for heartburn and acid refux  ? pioglitazone (ACTOS) 30 MG tablet Take 1 tablet by mouth once daily  ? rosuvastatin (CRESTOR) 10 MG tablet Take 1 tablet (10 mg total) by mouth daily.  ? tamsulosin (FLOMAX) 0.4 MG CAPS capsule Take 1 capsule (0.4 mg total) by mouth every evening.  ? ?No facility-administered medications prior to visit.  ? ? ? ? ?  Objective  ?  ?BP 128/73 (BP Location: Right Arm, Patient Position: Sitting, Cuff Size: Large)   Pulse 69   Temp 98.4 ?F (36.9 ?C) (Oral)   Resp 16   Wt 214 lb 12.8 oz (97.4 kg)   BMI 26.15 kg/m?  ? ? ?Physical Exam  ? ?General: Appearance:     ?Well developed, well nourished male in no acute distress  ?Eyes:    PERRL, conjunctiva/corneas clear, EOM's intact       ?Lungs:     Clear to auscultation bilaterally, respirations unlabored  ?Heart:    Normal heart rate. Normal rhythm. No murmurs, rubs, or gallops.    ?MS:   All extremities are intact.    ?Neurologic:   Awake, alert, oriented x 3. No apparent focal neurological defect.   ?   ?  ? ?No results found for any visits on 11/22/21. ? Assessment & Plan  ?  ? ?1. Type 2 diabetes mellitus with complication, without long-term current use of insulin ([place]) ?Doing well current medications, although not exercising as much. Anticipates doing more with milder spring weather.  ?- Urine Albumin-Creatinine with uACR ?- Hemoglobin A1c ?- Renal function panel ? ?2. Hyperlipidemia associated with type 2 diabetes mellitus ([place]) ?He is tolerating rosuvastatin well with no adverse effects.   ?- Lipid panel  ?   ? ?The entirety of the information documented in the History of Present Illness, Review of Systems and Physical Exam were personally obtained by me. Portions of this information were initially documented by the CMA and reviewed by me for thoroughness and accuracy.   ? ? ?[name], MD  ?[facility] ?[phone] (phone) ?[phone] (fax) ? ?[facility]  ?

## 2021-11-24 LAB — MICROALBUMIN / CREATININE URINE RATIO
Creatinine, Urine: 58.3 mg/dL
Microalb/Creat Ratio: 50 mg/g creat — ABNORMAL HIGH (ref 0–29)
Microalbumin, Urine: 29.3 ug/mL

## 2021-11-24 LAB — RENAL FUNCTION PANEL
Albumin: 4.7 g/dL (ref 3.7–4.7)
BUN/Creatinine Ratio: 14 (ref 10–24)
BUN: 16 mg/dL (ref 8–27)
CO2: 21 mmol/L (ref 20–29)
Calcium: 9.7 mg/dL (ref 8.6–10.2)
Chloride: 107 mmol/L — ABNORMAL HIGH (ref 96–106)
Creatinine, Ser: 1.18 mg/dL (ref 0.76–1.27)
Glucose: 147 mg/dL — ABNORMAL HIGH (ref 70–99)
Phosphorus: 3.6 mg/dL (ref 2.8–4.1)
Potassium: 4.7 mmol/L (ref 3.5–5.2)
Sodium: 144 mmol/L (ref 134–144)
eGFR: 63 mL/min/{1.73_m2} (ref >59)

## 2021-11-24 LAB — LIPID PANEL
Chol/HDL Ratio: 2.1 ratio (ref 0.0–5.0)
Cholesterol, Total: 102 mg/dL (ref 100–199)
HDL: 48 mg/dL (ref >39)
LDL Chol Calc (NIH): 37 mg/dL (ref 0–99)
Triglycerides: 84 mg/dL (ref 0–149)
VLDL Cholesterol Cal: 17 mg/dL (ref 5–40)

## 2021-11-24 LAB — HEMOGLOBIN A1C
Est. average glucose Bld gHb Est-mCnc: 186 mg/dL
Hgb A1c MFr Bld: 8.1 % — ABNORMAL HIGH (ref 4.8–5.6)

## 2021-11-26 ENCOUNTER — Other Ambulatory Visit: Payer: Self-pay | Admitting: Family Medicine

## 2021-11-26 DIAGNOSIS — E790 Hyperuricemia without signs of inflammatory arthritis and tophaceous disease: Secondary | ICD-10-CM

## 2021-12-21 ENCOUNTER — Other Ambulatory Visit: Payer: Self-pay | Admitting: Internal Medicine

## 2021-12-21 ENCOUNTER — Other Ambulatory Visit: Payer: Self-pay | Admitting: Family Medicine

## 2021-12-21 DIAGNOSIS — E1140 Type 2 diabetes mellitus with diabetic neuropathy, unspecified: Secondary | ICD-10-CM

## 2022-01-17 ENCOUNTER — Ambulatory Visit (INDEPENDENT_AMBULATORY_CARE_PROVIDER_SITE_OTHER): Payer: Medicare Other | Admitting: Internal Medicine

## 2022-01-17 ENCOUNTER — Other Ambulatory Visit: Payer: Self-pay | Admitting: Internal Medicine

## 2022-01-17 ENCOUNTER — Encounter: Payer: Self-pay | Admitting: Internal Medicine

## 2022-01-17 VITALS — BP 120/68 | HR 69 | Ht 76.0 in | Wt 211.0 lb

## 2022-01-17 DIAGNOSIS — I255 Ischemic cardiomyopathy: Secondary | ICD-10-CM

## 2022-01-17 DIAGNOSIS — I2510 Atherosclerotic heart disease of native coronary artery without angina pectoris: Secondary | ICD-10-CM | POA: Diagnosis not present

## 2022-01-17 DIAGNOSIS — I10 Essential (primary) hypertension: Secondary | ICD-10-CM | POA: Diagnosis not present

## 2022-01-17 DIAGNOSIS — E1169 Type 2 diabetes mellitus with other specified complication: Secondary | ICD-10-CM

## 2022-01-17 DIAGNOSIS — E785 Hyperlipidemia, unspecified: Secondary | ICD-10-CM

## 2022-01-17 NOTE — Progress Notes (Unsigned)
Follow-up Outpatient Visit Date: 01/17/2022  Primary Care Provider: Birdie Sons, MD [address] [place] [address]  Chief Complaint: Follow-up CAD  HPI:  [name] is a 81 y.o. male with history of coronary artery disease status post CABG (10/863) complicated by postoperative atrial fibrillation, ischemic cardiomyopathy (LVEF normal by echo in 04/2017), type 2 diabetes mellitus, and hypertension, who presents for follow-up of coronary artery disease and ischemic cardiomyopathy.  I last saw him in 07/2021, at which time Mr. Punt was feeling fairly well though he endorses some anhedonia but denied frank depression.  Blood pressure was mildly elevated at that visit, though we agreed to defer medication changes in favor of lifestyle modifications.  Today, Mr. Harker reports that he feels about the same as at our last visit.  He is not cycling regularly but has been working quite a bit in his garden.  He has been using an elliptical trainer more often.  He denies chest pain, shortness of breath, palpitations, lightheadedness, and edema.  He notes chronic numbness in his feet, that is unchanged.  His blood sugars remain above goal (most recent A1c 8.1%), which he attributes to dietary indiscretion.  --------------------------------------------------------------------------------------------------  Cardiovascular History & Procedures: Cardiovascular Problems: Coronary artery disease status post CABG (09/2016) Ischemic cardiomyopathy Postoperative atrial fibrillation   Risk Factors: Known coronary artery disease, diabetes mellitus, hypertension, age greater than 5, and male gender   Cath/PCI: LHC (09/16/16): Significant two-vessel CAD with 95% proximal LAD stenosis involving ostium of D1, 90% ostial ramus intermedius stenosis, and at least 50% lesions involving the ostium and mid section of the LCx. RCA with 25% distal stenosis. LVEF 55-60% with normal LVEDP.   CV  Surgery: CABG (09/24/16, Dr. Roxan Hockey): LIMA to LAD, SVG to D1, and sequential SVG to first and second branches of ramus intermedius.   EP Procedures and Devices: None   Non-Invasive Evaluation(s): Exercise MPI (02/02/2020): Low risk study without ischemia or scar.  LVEF 64%. Exercise MPI (04/07/2018): Interpreted as intermediate risk with possible apical anterior and apical ischemia and small inferolateral infarct.  Normal LVEF.  Greater than 1 mm ST depression at stress.  I personally reviewed the images and believe that the apical defect represents artifact, as there is normal perfusion in this region on the non-attenuation corrected images.  Only perfusion abnormality is a small apical inferior/lateral defect that may represent prior infarct, consistent with a low risk study. Transthoracic echocardiogram (04/23/17): Normal LV size. LVEF 55-60% with anteroseptal hypokinesis (? related to prior cardiac surgery). Normal diastolic function. Borderline dilated aortic root (3.7 cm). Mild MR. Normal RV size and function. Normal pulmonary artery pressure. Carotid Doppler (09/23/16): Mild (1-39%) stenoses involving the left and right internal carotid arteries. Vertebral arteries with antegrade flow. ABIs (09/23/16): Right 1.98 and the left 1.49 suggestive of medial calcification. Transthoracic echocardiogram (09/19/16): Normal LV size with mildly reduced contraction (EF 45-50%) with basal and mid anterior and anteroseptal hypokinesis. Grade 1 diastolic dysfunction. Mildly dilated left atrium. Normal RV size and function.  Recent CV Pertinent Labs: Lab Results  Component Value Date   CHOL 102 11/22/2021   HDL 48 11/22/2021   LDLCALC 37 11/22/2021   TRIG 84 11/22/2021   CHOLHDL 2.1 11/22/2021   CHOLHDL 3.1 11/09/2019   INR 1.17 09/29/2016   BNP 60.0 11/16/2017   K 4.7 11/22/2021   MG 1.9 09/25/2016   BUN 16 11/22/2021   CREATININE 1.18 11/22/2021    Past medical and surgical history were reviewed  and updated in EPIC.  Current Meds  Medication Sig   allopurinol (ZYLOPRIM) 100 MG tablet Take 1 tablet by mouth once daily   aspirin EC 81 MG tablet Take 1 tablet (81 mg total) by mouth daily.   Blood Glucose Monitoring Suppl (ACCU-CHEK AVIVA PLUS) w/Device KIT USE TO CHECK BLOOD SUGAR  DAILY AS DIRECTED   FARXIGA 10 MG TABS tablet TAKE 1 TABLET BY MOUTH ONCE DAILY IN THE MORNING BEFORE BREAKFAST   gabapentin (NEURONTIN) 300 MG capsule TAKE 1 CAPSULE BY MOUTH AT BEDTIME FOR  NEUROPATHY   glipiZIDE (GLUCOTROL XL) 10 MG 24 hr tablet Take 1 tablet by mouth once daily with breakfast   glucose blood (ACCU-CHEK AVIVA PLUS) test strip USE AS INSTRUCTED TO CHECK  BLOOD SUGAR ONCE DAILY FOR  TYPE 2 DIABETES   ketoconazole (NIZORAL) 2 % shampoo USE ON SCALP TWICE WEEKLY   metFORMIN (GLUCOPHAGE) 1000 MG tablet TAKE 1 TABLET BY MOUTH TWICE DAILY WITH MEALS   nitroGLYCERIN (NITROSTAT) 0.4 MG SL tablet DISSOLVE ONE TABLET UNDER THE TONGUE EVERY 5 MINUTES AS NEEDED FOR CHEST PAIN.  DO NOT EXCEED A TOTAL OF 3 DOSES IN 15 MINUTES   omeprazole (PRILOSEC) 40 MG capsule Take 1 capsule (40 mg total) by mouth in the morning and at bedtime. Take only as needed for heartburn and acid refux   pioglitazone (ACTOS) 30 MG tablet Take 1 tablet by mouth once daily   rosuvastatin (CRESTOR) 10 MG tablet Take 1 tablet (10 mg total) by mouth daily.   tamsulosin (FLOMAX) 0.4 MG CAPS capsule Take 1 capsule (0.4 mg total) by mouth every evening.   [DISCONTINUED] lisinopril (ZESTRIL) 10 MG tablet Take 1 tablet by mouth once daily    Allergies: No known allergies  Social History   Tobacco Use   Smoking status: Former    Packs/day: 1.00    Years: 5.00    Total pack years: 5.00    Types: Cigarettes    Quit date: 08/06/1972    Years since quitting: 49.4   Smokeless tobacco: Never  Vaping Use   Vaping Use: Never used  Substance Use Topics   Alcohol use: Yes    Alcohol/week: 2.0 - 3.0 standard drinks of alcohol    Types:  2 - 3 Glasses of wine per week    Comment: [place] in the evenings 4 days per week   Drug use: No    Family History  Problem Relation Age of Onset   Dementia Mother    Diabetes Mother    Cancer Father    Diabetes Sister    Dementia Sister    Diabetes Brother    Heart disease Other     Review of Systems: A 12-system review of systems was performed and was negative except as noted in the HPI.  --------------------------------------------------------------------------------------------------  Physical Exam: BP 120/68 (BP Location: Left Arm, Patient Position: Sitting, Cuff Size: Large)   Pulse 69   Ht _0  (1.93 m)   Wt 211 lb (95.7 kg)   SpO2 95%   BMI 25.68 kg/m   General:  NAD. Accompanied by his wife. Neck: No JVD or HJR. Lungs: Clear to auscultation bilaterally without wheezes or crackles. Heart: Regular rate and rhythm without murmurs, rubs, or gallops. Abdomen: Soft, nontender, nondistended. Extremities: No lower extremity edema.  EKG:  NSR with sinus arrhythmia, left axis deviation, and inferior MI.  PR interval has shortened since 07/18/2021.  Otherwise, there has been no significant interval change.  Lab Results  Component Value Date   WBC 3.8 (L) 08/04/2019   HGB 14.6 08/04/2019   HCT 44.5 08/04/2019   MCV 94.5 08/04/2019   PLT 186 08/04/2019    Lab Results  Component Value Date   NA 144 11/22/2021   K 4.7 11/22/2021   CL 107 (H) 11/22/2021   CO2 21 11/22/2021   BUN 16 11/22/2021   CREATININE 1.18 11/22/2021   GLUCOSE 147 (H) 11/22/2021   ALT 16 12/05/2020    Lab Results  Component Value Date   CHOL 102 11/22/2021   HDL 48 11/22/2021   LDLCALC 37 11/22/2021   TRIG 84 11/22/2021   CHOLHDL 2.1 11/22/2021    --------------------------------------------------------------------------------------------------  ASSESSMENT AND PLAN: CAD: No angina reported.  Continue current medications for secondary prevention.  I encouraged Mr. Beaumier to remain  active and to resume cycling, which he has always enjoyed.  Ischemic cardiomyopathy: Mr. Bushway appears euvolemic with NYHA class I symptoms.   Defer medication changes at this time.  Hypertension: BP well-controlled.  No medication changes today.  Hyperlipidemia associated with type 2 diabetes mellitus: Lipids very well-controlled on last check.  Continue rosuvastatin 10 mg daily.  Ongoing management of suboptimally controlled DM per Dr. Caryn Section.  Follow-up: Return to clinic in 6 months.  Nelva Bush, MD 01/18/2022 1:56 PM

## 2022-01-17 NOTE — Patient Instructions (Signed)

## 2022-01-18 ENCOUNTER — Encounter: Payer: Self-pay | Admitting: Internal Medicine

## 2022-02-05 ENCOUNTER — Other Ambulatory Visit: Payer: Self-pay

## 2022-02-05 MED ORDER — ROSUVASTATIN CALCIUM 10 MG PO TABS: 10.0000 mg | ORAL_TABLET | Freq: Every day | ORAL | 1 refills | Status: DC

## 2022-02-28 ENCOUNTER — Other Ambulatory Visit: Payer: Self-pay | Admitting: Family Medicine

## 2022-02-28 DIAGNOSIS — G629 Polyneuropathy, unspecified: Secondary | ICD-10-CM

## 2022-02-28 DIAGNOSIS — N401 Enlarged prostate with lower urinary tract symptoms: Secondary | ICD-10-CM

## 2022-03-06 ENCOUNTER — Other Ambulatory Visit: Payer: Self-pay | Admitting: Family Medicine

## 2022-04-21 ENCOUNTER — Ambulatory Visit (INDEPENDENT_AMBULATORY_CARE_PROVIDER_SITE_OTHER): Payer: Medicare Other | Admitting: Family Medicine

## 2022-04-21 ENCOUNTER — Encounter: Payer: Self-pay | Admitting: Family Medicine

## 2022-04-21 VITALS — BP 137/74 | HR 66 | Temp 97.8°F | Ht 76.0 in | Wt 209.0 lb

## 2022-04-21 DIAGNOSIS — Z125 Encounter for screening for malignant neoplasm of prostate: Secondary | ICD-10-CM

## 2022-04-21 DIAGNOSIS — Z0000 Encounter for general adult medical examination without abnormal findings: Secondary | ICD-10-CM | POA: Diagnosis not present

## 2022-04-21 DIAGNOSIS — E118 Type 2 diabetes mellitus with unspecified complications: Secondary | ICD-10-CM | POA: Diagnosis not present

## 2022-04-21 DIAGNOSIS — G629 Polyneuropathy, unspecified: Secondary | ICD-10-CM | POA: Diagnosis not present

## 2022-04-21 DIAGNOSIS — Z23 Encounter for immunization: Secondary | ICD-10-CM

## 2022-04-21 LAB — POCT GLYCOSYLATED HEMOGLOBIN (HGB A1C)
Est. average glucose Bld gHb Est-mCnc: 203
Hemoglobin A1C: 8.7 % — AB (ref 4.0–5.6)

## 2022-04-21 NOTE — Progress Notes (Signed)
I,Stuart Henry,acting as a scribe for Stuart Huh, MD.,have documented all relevant documentation on the behalf of Stuart Huh, MD,as directed by  Stuart Huh, MD while in the presence of Stuart Huh, MD.   Complete Physical Exam      Patient: Stuart Henry, Male    DOB: August 30, 1940, 81 y.o.   MRN: [id number] Visit Date: 04/21/2022  Today's Provider: [name], MD   Chief Complaint  Patient presents with   Medicare Wellness   Diabetes   Subjective    Stuart Henry is a 81 y.o. male who presents today for his complete physical examination. He reports consuming a general diet. Gym/ health club routine includes cardio, light weights, and golfing. He generally feels fairly well. He reports sleeping fairly well. He does not have additional problems to discuss today.   HPI Diabetes Mellitus Type II, Follow-up  Lab Results  Component Value Date   HGBA1C 8.1 (H) 11/22/2021   HGBA1C 8.7 (A) 08/12/2021   HGBA1C 7.6 (A) 04/10/2021   Wt Readings from Last 3 Encounters:  04/21/22 209 lb (94.8 kg)  01/17/22 211 lb (95.7 kg)  11/22/21 214 lb 12.8 oz (97.4 kg)   Last seen for diabetes 4 months ago.  Management since then includes advising patient to work on improving diet and regular exercise. He reports fair compliance with treatment. He is not having side effects.  Symptoms: No fatigue No foot ulcerations  No appetite changes No nausea  No paresthesia of the feet  No polydipsia  No polyuria No visual disturbances   No vomiting     Home blood sugar records: fasting range: 140-170  Episodes of hypoglycemia? No    Current insulin regiment: none Most Recent Eye Exam: 11/06/2021 Current exercise: cardiovascular workout on exercise equipment and golfing Current diet habits: in general, an "unhealthy" diet  Pertinent Labs: Lab Results  Component Value Date   CHOL 102 11/22/2021   HDL 48 11/22/2021   LDLCALC 37 11/22/2021   TRIG 84 11/22/2021    CHOLHDL 2.1 11/22/2021   Lab Results  Component Value Date   NA 144 11/22/2021   K 4.7 11/22/2021   CREATININE 1.18 11/22/2021   EGFR 63 11/22/2021   MICROALBUR 20 08/31/2020   LABMICR 29.3 11/22/2021     ---------------------------------------------------------------------------------------------------    Medications: Outpatient Medications Prior to Visit  Medication Sig   ACCU-CHEK AVIVA PLUS test strip USE AS INSTRUCTED TO CHECK  BLOOD SUGAR ONCE DAILY FOR  TYPE 2 DIABETES   allopurinol (ZYLOPRIM) 100 MG tablet Take 1 tablet by mouth once daily   aspirin EC 81 MG tablet Take 1 tablet (81 mg total) by mouth daily.   Blood Glucose Monitoring Suppl (ACCU-CHEK AVIVA PLUS) w/Device KIT USE TO CHECK BLOOD SUGAR  DAILY AS DIRECTED   FARXIGA 10 MG TABS tablet TAKE 1 TABLET BY MOUTH ONCE DAILY IN THE MORNING BEFORE BREAKFAST   gabapentin (NEURONTIN) 300 MG capsule TAKE 1 CAPSULE BY MOUTH AT BEDTIME FOR  NEUROPATHY   glipiZIDE (GLUCOTROL XL) 10 MG 24 hr tablet Take 1 tablet by mouth once daily with breakfast   ketoconazole (NIZORAL) 2 % shampoo USE ON SCALP TWICE WEEKLY   lisinopril (ZESTRIL) 10 MG tablet Take 1 tablet by mouth once daily   metFORMIN (GLUCOPHAGE) 1000 MG tablet TAKE 1 TABLET BY MOUTH TWICE DAILY WITH MEALS   nitroGLYCERIN (NITROSTAT) 0.4 MG SL tablet DISSOLVE ONE TABLET UNDER THE TONGUE EVERY 5 MINUTES AS NEEDED FOR CHEST PAIN.  DO NOT EXCEED A TOTAL OF 3 DOSES IN 15 MINUTES   omeprazole (PRILOSEC) 40 MG capsule Take 1 capsule (40 mg total) by mouth in the morning and at bedtime. Take only as needed for heartburn and acid refux   pioglitazone (ACTOS) 30 MG tablet Take 1 tablet by mouth once daily   rosuvastatin (CRESTOR) 10 MG tablet Take 1 tablet (10 mg total) by mouth daily.   tamsulosin (FLOMAX) 0.4 MG CAPS capsule TAKE 1 CAPSULE BY MOUTH IN THE EVENING   No facility-administered medications prior to visit.    Allergies  Allergen Reactions   No Known Allergies      Patient Care Team: Birdie Sons, MD as PCP - General (Family Medicine) End, Harrell Gave, MD as PCP - Cardiology (Cardiology) End, Harrell Gave, MD as Consulting Physician (Cardiology) Calvert Cantor, MD as Consulting Physician (Ophthalmology) Lonia Skinner, MD as Consulting Physician (Ophthalmology)  Review of Systems  Constitutional:  Negative for appetite change, chills, fatigue and fever.  HENT:  Negative for congestion, ear pain, hearing loss, nosebleeds and trouble swallowing.   Eyes:  Negative for pain and visual disturbance.  Respiratory:  Negative for cough, chest tightness and shortness of breath.   Cardiovascular:  Negative for chest pain, palpitations and leg swelling.  Gastrointestinal:  Negative for abdominal pain, blood in stool, constipation, diarrhea, nausea and vomiting.  Endocrine: Negative for polydipsia, polyphagia and polyuria.  Genitourinary:  Negative for dysuria and flank pain.  Musculoskeletal:  Negative for arthralgias, back pain, joint swelling, myalgias and neck stiffness.  Skin:  Negative for color change, rash and wound.  Neurological:  Negative for dizziness, tremors, seizures, speech difficulty, weakness, light-headedness and headaches.  Hematological:  Bruises/bleeds easily.  Psychiatric/Behavioral:  Negative for behavioral problems, confusion, decreased concentration, dysphoric mood and sleep disturbance. The patient is not nervous/anxious.   All other systems reviewed and are negative.       Objective    Vitals: BP 137/74 (BP Location: Left Arm, Patient Position: Sitting, Cuff Size: Large)   Pulse 66   Temp 97.8 F (36.6 C) (Oral)   Ht _0  (1.93 m)   Wt 209 lb (94.8 kg)   SpO2 100% Comment: room air  BMI 25.44 kg/m     Physical Exam  General Appearance:    Well developed, well nourished male. Alert, cooperative, in no acute distress, appears stated age  Head:    Normocephalic, without obvious abnormality, atraumatic  Eyes:     PERRL, conjunctiva/corneas clear, EOM's intact, fundi    benign, both eyes       Ears:    Normal TM's and external ear canals, both ears  Nose:   Nares normal, septum midline, mucosa normal, no drainage   or sinus tenderness  Throat:   Lips, mucosa, and tongue normal; teeth and gums normal  Neck:   Supple, symmetrical, trachea midline, no adenopathy;       thyroid:  No enlargement/tenderness/nodules; no carotid   bruit or JVD  Back:     Symmetric, no curvature, ROM normal, no CVA tenderness  Lungs:     Clear to auscultation bilaterally, respirations unlabored  Chest wall:    No tenderness or deformity  Heart:    Normal heart rate. Normal rhythm. No murmurs, rubs, or gallops.  S1 and S2 normal  Abdomen:     Soft, non-tender, bowel sounds active all four quadrants,    no masses, no organomegaly  Genitalia:    deferred  Rectal:    deferred  Extremities:   All extremities are intact. No cyanosis or edema  Pulses:   2+ and symmetric all extremities  Skin:   Skin color, texture, turgor normal, no rashes or lesions  Lymph nodes:   Cervical, supraclavicular, and axillary nodes normal  Neurologic:   CNII-XII intact. Normal strength, sensation and reflexes      throughout   Results for orders placed or performed in visit on 04/21/22  POCT HgB A1C  Result Value Ref Range   Hemoglobin A1C 8.7 (A) 4.0 - 5.6 %   Est. average glucose Bld gHb Est-mCnc 203      Assessment & Plan     1. Annual physical exam   2. Need for influenza vaccination  - Flu Vaccine QUAD High Dose IM (Fluad)  3. Prostate cancer screening  - PSA Total (Reflex To Free) ([facility] only)  4. Polyneuropathy  - Vitamin B12  5. Type 2 diabetes mellitus with complication, without long-term current use of insulin ([place]) Not at goal. He is going to work on portion control. Is maxed out on current medications. If not improving at follow up may need to consider addition of GLP-a agonist.      The entirety of the  information documented in the History of Present Illness, Review of Systems and Physical Exam were personally obtained by me. Portions of this information were initially documented by the CMA and reviewed by me for thoroughness and accuracy.     Stuart Huh, MD  [facility] [phone] (phone) [phone] (fax)  [place]

## 2022-04-21 NOTE — Progress Notes (Signed)
Annual Wellness Visit     Patient: Stuart Henry, Male    DOB: 01-20-1941, 81 y.o.   MRN: [id number] Visit Date: 04/21/2022  Today's Provider: [name], MD   Chief Complaint  Patient presents with   Medicare Wellness   Diabetes   Subjective    Stuart Henry is a 81 y.o. male who presents today for his Annual Wellness Visit.   Medications: Outpatient Medications Prior to Visit  Medication Sig   ACCU-CHEK AVIVA PLUS test strip USE AS INSTRUCTED TO CHECK  BLOOD SUGAR ONCE DAILY FOR  TYPE 2 DIABETES   allopurinol (ZYLOPRIM) 100 MG tablet Take 1 tablet by mouth once daily   aspirin EC 81 MG tablet Take 1 tablet (81 mg total) by mouth daily.   Blood Glucose Monitoring Suppl (ACCU-CHEK AVIVA PLUS) w/Device KIT USE TO CHECK BLOOD SUGAR  DAILY AS DIRECTED   FARXIGA 10 MG TABS tablet TAKE 1 TABLET BY MOUTH ONCE DAILY IN THE MORNING BEFORE BREAKFAST   gabapentin (NEURONTIN) 300 MG capsule TAKE 1 CAPSULE BY MOUTH AT BEDTIME FOR  NEUROPATHY   glipiZIDE (GLUCOTROL XL) 10 MG 24 hr tablet Take 1 tablet by mouth once daily with breakfast   ketoconazole (NIZORAL) 2 % shampoo USE ON SCALP TWICE WEEKLY   lisinopril (ZESTRIL) 10 MG tablet Take 1 tablet by mouth once daily   metFORMIN (GLUCOPHAGE) 1000 MG tablet TAKE 1 TABLET BY MOUTH TWICE DAILY WITH MEALS   nitroGLYCERIN (NITROSTAT) 0.4 MG SL tablet DISSOLVE ONE TABLET UNDER THE TONGUE EVERY 5 MINUTES AS NEEDED FOR CHEST PAIN.  DO NOT EXCEED A TOTAL OF 3 DOSES IN 15 MINUTES   omeprazole (PRILOSEC) 40 MG capsule Take 1 capsule (40 mg total) by mouth in the morning and at bedtime. Take only as needed for heartburn and acid refux   pioglitazone (ACTOS) 30 MG tablet Take 1 tablet by mouth once daily   rosuvastatin (CRESTOR) 10 MG tablet Take 1 tablet (10 mg total) by mouth daily.   tamsulosin (FLOMAX) 0.4 MG CAPS capsule TAKE 1 CAPSULE BY MOUTH IN THE EVENING   No facility-administered medications prior to visit.    Allergies   Allergen Reactions   No Known Allergies     Patient Care Team: Birdie Sons, MD as PCP - General (Family Medicine) End, Harrell Gave, MD as PCP - Cardiology (Cardiology) End, Harrell Gave, MD as Consulting Physician (Cardiology) Calvert Cantor, MD as Consulting Physician (Ophthalmology) Lonia Skinner, MD as Consulting Physician (Ophthalmology)        Objective      Most recent functional status assessment:    04/21/2022    2:06 PM  In your present state of health, do you have any difficulty performing the following activities:  Hearing? 0  Vision? 0  Difficulty concentrating or making decisions? 1  Walking or climbing stairs? 0  Dressing or bathing? 0  Doing errands, shopping? 0   Most recent fall risk assessment:    04/21/2022    2:07 PM  Fall Risk   Falls in the past year? 0  Number falls in past yr: 0  Injury with Fall? 0  Risk for fall due to : No Fall Risks  Follow up Falls evaluation completed    Most recent depression screenings:    04/21/2022    2:06 PM 11/22/2021    9:51 AM  PHQ 2/9 Scores  PHQ - 2 Score 0 0  PHQ- 9 Score 0    Most recent  cognitive screening:    11/14/2016   10:52 AM  6CIT Screen  What Year? 0 points  What month? 0 points  What time? 0 points  Count back from 20 0 points  Months in reverse 0 points  Repeat phrase 2 points  Total Score 2 points   Most recent Audit-C alcohol use screening    04/21/2022    2:07 PM  Alcohol Use Disorder Test (AUDIT)  1. How often do you have a drink containing alcohol? 1  2. How many drinks containing alcohol do you have on a typical day when you are drinking? 1  3. How often do you have six or more drinks on one occasion? 0  AUDIT-C Score 2   A score of 3 or more in women, and 4 or more in men indicates increased risk for alcohol abuse, EXCEPT if all of the points are from question 1   Results for orders placed or performed in visit on 04/21/22  POCT HgB A1C  Result Value Ref Range    Hemoglobin A1C 8.7 (A) 4.0 - 5.6 %   Est. average glucose Bld gHb Est-mCnc 203     Assessment & Plan     Annual wellness visit done today including the all of the following: Reviewed patient's Family Medical History Reviewed and updated list of patient's medical providers Assessment of cognitive impairment was done Assessed patient's functional ability Established a written schedule for health screening [place] Completed and Reviewed  Exercise Activities and Dietary recommendations  Goals      Decrease soda consumption     Recommend decreasing soda intake from 32 oz to 16 oz a day.      DIET - INCREASE WATER INTAKE     Recommend increasing water intake to 2-4 glasses a day.          Immunization History  Administered Date(s) Administered   Fluad Quad(high Dose 65+) 04/10/2021, 04/21/2022   Influenza,inj,Quad PF,6+ Mos 05/02/2016   Influenza-Unspecified 05/08/2017, 04/13/2018, 05/04/2019   Moderna Sars-Covid-2 Vaccination 09/06/2019, 10/06/2019, 04/16/2020   Pneumococcal Conjugate-13 03/10/2014   Pneumococcal Polysaccharide-23 08/31/2006, 03/02/2012   Td 08/04/2002   Tdap 03/10/2014   Zoster Recombinat (Shingrix) 04/05/2018, 07/31/2018   Zoster, Live 06/16/2011    Health Maintenance  Topic Date Due   Hepatitis C Screening  Never done   COVID-19 Vaccine (4 - Moderna risk series) 06/11/2020   COLONOSCOPY (Pts 45-57yr Insurance coverage will need to be confirmed)  02/08/2022   HEMOGLOBIN A1C  10/20/2022   OPHTHALMOLOGY EXAM  11/07/2022   Diabetic kidney evaluation - GFR measurement  11/23/2022   Diabetic kidney evaluation - Urine ACR  11/23/2022   TETANUS/TDAP  03/10/2024   Pneumonia Vaccine 81 Years old  Completed   INFLUENZA VACCINE  Completed   Zoster Vaccines- Shingrix  Completed   HPV VACCINES  Aged Out     Discussed health benefits of physical activity, and encouraged him to engage in regular exercise appropriate for his age and  condition.              DLelon Huh MD  [phone](phone) [phone](fax)  [place]

## 2022-04-22 ENCOUNTER — Telehealth: Payer: Self-pay

## 2022-04-22 LAB — PSA TOTAL (REFLEX TO FREE): Prostate Specific Ag, Serum: 0.4 ng/mL (ref 0.0–4.0)

## 2022-04-22 LAB — VITAMIN B12: Vitamin B-12: 295 pg/mL (ref 232–1245)

## 2022-04-22 NOTE — Telephone Encounter (Signed)
Pt called for lab results. Shared provider's note. Pt will pick up Vit B12 and start taking.  Birdie Sons, MD  04/22/2022  8:30 AM EDT     PSA is normal.  Vitamin B12 level is a little which is probably aggravating his neuropathy. Recommend he start taking OTC vitamin B12 1,'000mg'$  once every day. Follow up in January as scheduled.

## 2022-05-19 ENCOUNTER — Other Ambulatory Visit: Payer: Self-pay | Admitting: Family Medicine

## 2022-05-19 DIAGNOSIS — G629 Polyneuropathy, unspecified: Secondary | ICD-10-CM

## 2022-05-22 ENCOUNTER — Other Ambulatory Visit: Payer: Self-pay | Admitting: Family Medicine

## 2022-05-22 DIAGNOSIS — N401 Enlarged prostate with lower urinary tract symptoms: Secondary | ICD-10-CM

## 2022-05-22 NOTE — Telephone Encounter (Signed)
Requested Prescriptions  Pending Prescriptions Disp Refills  . tamsulosin (FLOMAX) 0.4 MG CAPS capsule [Pharmacy Med Name: Tamsulosin HCl 0.4 MG Oral Capsule] 90 capsule 0    Sig: TAKE 1 CAPSULE BY MOUTH IN THE EVENING     Urology: Alpha-Adrenergic Blocker Passed - 05/22/2022 11:47 AM      Passed - PSA in normal range and within 360 days    PSA  Date Value Ref Range Status  03/10/2014 0.5  Final   Prostate Specific Ag, Serum  Date Value Ref Range Status  04/21/2022 0.4 0.0 - 4.0 ng/mL Final    Comment:    Roche ECLIA methodology. According to the American Urological Association, Serum PSA should decrease and remain at undetectable levels after radical prostatectomy. The AUA defines biochemical recurrence as an initial PSA value 0.2 ng/mL or greater followed by a subsequent confirmatory PSA value 0.2 ng/mL or greater. Values obtained with different assay methods or kits cannot be used interchangeably. Results cannot be interpreted as absolute evidence of the presence or absence of malignant disease.          Passed - Last BP in normal range    BP Readings from Last 1 Encounters:  04/21/22 137/74         Passed - Valid encounter within last 12 months    Recent Outpatient Visits          1 month ago Annual physical exam   [facility] Birdie Sons, MD   6 months ago Type 2 diabetes mellitus with complication, without long-term current use of insulin [facility])   [facility] Birdie Sons, MD   9 months ago Type 2 diabetes mellitus with complication, without long-term current use of insulin [facility])   [facility] Birdie Sons, MD   1 year ago Annual physical exam   [facility] Birdie Sons, MD   1 year ago Type 2 diabetes mellitus with complication, without long-term current use of insulin [facility])   [facility] Birdie Sons, MD      Future Appointments            In 2 months End,  Harrell Gave, MD [place]. [place]   In 3 months Fisher, Kirstie Peri, MD [facility], [place]

## 2022-06-13 ENCOUNTER — Other Ambulatory Visit: Payer: Self-pay | Admitting: Family Medicine

## 2022-06-13 DIAGNOSIS — L219 Seborrheic dermatitis, unspecified: Secondary | ICD-10-CM

## 2022-06-16 ENCOUNTER — Other Ambulatory Visit: Payer: Self-pay | Admitting: Family Medicine

## 2022-06-16 ENCOUNTER — Telehealth: Payer: Self-pay

## 2022-06-16 DIAGNOSIS — E1140 Type 2 diabetes mellitus with diabetic neuropathy, unspecified: Secondary | ICD-10-CM

## 2022-06-16 NOTE — Telephone Encounter (Signed)
Copied from [place] [id number]. Topic: Referral - Request for Referral >> Jun 16, 2022  9:57 AM Chapman Fitch wrote: Has patient seen PCP for this complaint? No  *If NO, is insurance requiring patient see PCP for this issue before PCP can refer them? Referral for which specialty: Dermatologist  Preferred provider/office:  Reason for referral: bump or hard knot on neck (size of a penny, raised 8th of an inch) / pt had this about a month

## 2022-06-19 NOTE — Progress Notes (Signed)
I,[name],acting as a scribe for [name], MD.,have documented all relevant documentation on the behalf of [name], MD,as directed by  [name], MD while in the presence of [name], MD.    Established patient visit   Patient: Stuart Henry   DOB: 03-28-1941   81 y.o. Male  MRN: [id number] Visit Date: 06/20/2022  Today's healthcare provider: [name], MD   Chief Complaint  Patient presents with   Lesion   Sinus Problem   Subjective    Sinus Problem This is a new problem. Episode onset: 10 days ago. The problem has been gradually improving since onset. There has been no fever. Associated symptoms include coughing (productive with yellow sputum) and sinus pressure. Pertinent negatives include no chills, ear pain, headaches, shortness of breath or sore throat. Treatments tried: Coricidin HBP.    Skin Lesion: Patient reports having a sore on the left side of his neck. The lesion appeared 2 months ago. The He is requesting a dermatology referral.     Medications: Outpatient Medications Prior to Visit  Medication Sig   ACCU-CHEK AVIVA PLUS test strip USE AS INSTRUCTED TO CHECK  BLOOD SUGAR ONCE DAILY FOR  TYPE 2 DIABETES   allopurinol (ZYLOPRIM) 100 MG tablet Take 1 tablet by mouth once daily   aspirin EC 81 MG tablet Take 1 tablet (81 mg total) by mouth daily.   Blood Glucose Monitoring Suppl (ACCU-CHEK AVIVA PLUS) w/Device KIT USE TO CHECK BLOOD SUGAR  DAILY AS DIRECTED   FARXIGA 10 MG TABS tablet TAKE 1 TABLET BY MOUTH ONCE DAILY IN THE MORNING BEFORE BREAKFAST   gabapentin (NEURONTIN) 300 MG capsule TAKE 1 CAPSULE BY MOUTH AT BEDTIME FOR  NEUROPATHY   glipiZIDE (GLUCOTROL XL) 10 MG 24 hr tablet Take 1 tablet by mouth once daily with breakfast   ketoconazole (NIZORAL) 2 % shampoo USE ON SCALP TWICE A WEEK   lisinopril (ZESTRIL) 10 MG tablet Take 1 tablet by mouth once daily   metFORMIN (GLUCOPHAGE) 1000 MG tablet TAKE 1 TABLET BY MOUTH  TWICE DAILY WITH MEALS   nitroGLYCERIN (NITROSTAT) 0.4 MG SL tablet DISSOLVE ONE TABLET UNDER THE TONGUE EVERY 5 MINUTES AS NEEDED FOR CHEST PAIN.  DO NOT EXCEED A TOTAL OF 3 DOSES IN 15 MINUTES   omeprazole (PRILOSEC) 40 MG capsule Take 1 capsule (40 mg total) by mouth in the morning and at bedtime. Take only as needed for heartburn and acid refux   pioglitazone (ACTOS) 30 MG tablet Take 1 tablet by mouth once daily   rosuvastatin (CRESTOR) 10 MG tablet Take 1 tablet (10 mg total) by mouth daily.   tamsulosin (FLOMAX) 0.4 MG CAPS capsule TAKE 1 CAPSULE BY MOUTH IN THE EVENING   No facility-administered medications prior to visit.    Review of Systems  Constitutional:  Negative for appetite change, chills and fever.  HENT:  Positive for postnasal drip and sinus pressure. Negative for ear pain and sore throat.   Respiratory:  Positive for cough (productive with yellow sputum). Negative for chest tightness, shortness of breath and wheezing.   Cardiovascular:  Negative for chest pain and palpitations.  Gastrointestinal:  Negative for abdominal pain, nausea and vomiting.  Skin:  Positive for wound.  Neurological:  Negative for headaches.       Objective    BP (!) 142/79 (BP Location: Left Arm, Patient Position: Sitting, Cuff Size: Large)   Pulse 66   Temp 97.8 F (36.6 C) (Oral)  Resp 18   Wt 211 lb (95.7 kg)   SpO2 97% Comment: room air  BMI 25.68 kg/m    Today's Vitals   06/20/22 0932 06/20/22 0943  BP: (!) 143/75 (!) 142/79  Pulse: 62 66  Resp: 18   Temp: 97.8 F (36.6 C)   TempSrc: Oral   SpO2: 97%   Weight: 211 lb (95.7 kg)    Body mass index is 25.68 kg/m.   Physical Exam   General: Appearance:    Well developed, well nourished male in no acute distress  Eyes:    PERRL, conjunctiva/corneas clear, EOM's intact       HEENT:   Tender frontal and maxillary sinuses  Lungs:     Expiratory wheeze left lung fields, respirations unlabored  Heart:    Normal heart rate.  Normal rhythm. No murmurs, rubs, or gallops.    MS:   All extremities are intact.    Neurologic:   Awake, alert, oriented x 3. No apparent focal neurological defect.         Assessment & Plan     1. Skin lesion of neck  - Ambulatory referral to Dermatology  2. Bronchitis   3. Acute non-recurrent frontal sinusitis  - doxycycline (VIBRA-TABS) 100 MG tablet; Take 1 tablet (100 mg total) by mouth 2 (two) times daily for 10 days.  Dispense: 20 tablet; Refill: 0   Call if symptoms change or if not rapidly improving.         The entirety of the information documented in the History of Present Illness, Review of Systems and Physical Exam were personally obtained by me. Portions of this information were initially documented by the CMA and reviewed by me for thoroughness and accuracy.     [name], MD  [facility] [phone] (phone) [phone] (fax)  [place]

## 2022-06-20 ENCOUNTER — Encounter: Payer: Self-pay | Admitting: Family Medicine

## 2022-06-20 ENCOUNTER — Ambulatory Visit: Payer: Medicare Other | Admitting: Family Medicine

## 2022-06-20 VITALS — BP 142/79 | HR 66 | Temp 97.8°F | Resp 18 | Wt 211.0 lb

## 2022-06-20 DIAGNOSIS — L989 Disorder of the skin and subcutaneous tissue, unspecified: Secondary | ICD-10-CM

## 2022-06-20 DIAGNOSIS — J0110 Acute frontal sinusitis, unspecified: Secondary | ICD-10-CM | POA: Diagnosis not present

## 2022-06-20 DIAGNOSIS — J40 Bronchitis, not specified as acute or chronic: Secondary | ICD-10-CM | POA: Diagnosis not present

## 2022-06-20 MED ORDER — DOXYCYCLINE HYCLATE 100 MG PO TABS: 100.0000 mg | ORAL_TABLET | Freq: Two times a day (BID) | ORAL | 0 refills | Status: AC

## 2022-07-22 NOTE — Progress Notes (Unsigned)
Follow-up Outpatient Visit Date: 07/23/2022  Primary Care Provider: Birdie Sons, MD [address] [place] [address]  Chief Complaint: Follow-up coronary artery disease  HPI:  [name] is a 81 y.o. 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  I last saw him in June, at which time he was feeling fairly well and exercising more regularly.  He complained of chronic numbness in his feet but was otherwise without symptoms.  He noted that his hemoglobin A1c was still above goal at 8.1%.  We did not make any medication changes or pursue additional testing.  Today, [name] reports that he has been feeling fairly well.  Both he and his wife have noticed that his stamina has continued to gradually decrease, though he also notes that he is less active than he used to be.  He still tries to go to the gym some and play golf, though this has not been as regular as in the past.  He is no longer biking either.  He denies any specific cardiac symptoms, not having experienced any chest pain, shortness of breath, palpitations, lightheadedness, or edema.  He continues to have some balance problems that he attributes to his neuropathy.  He has not fallen.  He does not monitor his blood pressure regularly at home.  He notes that his blood sugars have been more elevated the last few weeks, which she attributes to some dietary indiscretion.  His last hemoglobin A1c in September was still fairly elevated at 8.7%.  --------------------------------------------------------------------------------------------------  Cardiovascular History & Procedures: Cardiovascular Problems: Coronary artery disease status post CABG (09/2016) Ischemic  cardiomyopathy Postoperative atrial fibrillation   Risk Factors: Known coronary artery disease, diabetes mellitus, hypertension, age greater than 58, and male gender   Cath/PCI: LHC (09/16/16): Significant two-vessel CAD with 95% proximal LAD stenosis involving ostium of D1, 90% ostial ramus intermedius stenosis, and at least 50% lesions involving the ostium and mid section of the LCx. RCA with 25% distal stenosis. LVEF 55-60% with normal LVEDP.   CV Surgery: CABG (09/24/16, Dr. Roxan Hockey): LIMA to LAD, SVG to D1, and sequential SVG to first and second branches of ramus intermedius.   EP Procedures and Devices: None   Non-Invasive Evaluation(s): Exercise MPI (02/02/2020): Low risk study without ischemia or scar.  LVEF 64%. Exercise MPI (04/07/2018): Interpreted as intermediate risk with possible apical anterior and apical ischemia and small inferolateral infarct.  Normal LVEF.  Greater than 1 mm ST depression at stress.  I personally reviewed the images and believe that the apical defect represents artifact, as there is normal perfusion in this region on the non-attenuation corrected images.  Only perfusion abnormality is a small apical inferior/lateral defect that may represent prior infarct, consistent with a low risk study. Transthoracic echocardiogram (04/23/17): Normal LV size. LVEF 55-60% with anteroseptal hypokinesis (? related to prior cardiac surgery). Normal diastolic function. Borderline dilated aortic root (3.7 cm). Mild MR. Normal RV size and function. Normal pulmonary artery pressure. Carotid Doppler (09/23/16): Mild (1-39%) stenoses involving the left and right internal carotid arteries. Vertebral arteries with antegrade flow. ABIs (09/23/16): Right 1.98 and the left 1.49 suggestive of medial calcification. Transthoracic echocardiogram (09/19/16): Normal LV size with mildly reduced contraction (EF 45-50%) with basal and mid anterior and anteroseptal hypokinesis. Grade 1 diastolic  dysfunction. Mildly dilated left atrium. Normal RV size and function.  Recent CV Pertinent Labs: Lab Results  Component Value Date   CHOL 102 11/22/2021   HDL 48 11/22/2021   LDLCALC 37 11/22/2021   TRIG 84 11/22/2021   CHOLHDL 2.1 11/22/2021   CHOLHDL 3.1 11/09/2019   INR 1.17 09/29/2016   BNP 60.0 11/16/2017   K 4.7 11/22/2021   MG 1.9 09/25/2016   BUN 16 11/22/2021   CREATININE 1.18 11/22/2021    Past medical and surgical history were reviewed and updated in EPIC.  Current Meds  Medication Sig   ACCU-CHEK AVIVA PLUS test strip USE AS INSTRUCTED TO CHECK  BLOOD SUGAR ONCE DAILY FOR  TYPE 2 DIABETES   allopurinol (ZYLOPRIM) 100 MG tablet Take 1 tablet by mouth once daily   aspirin EC 81 MG tablet Take 1 tablet (81 mg total) by mouth daily.   Blood Glucose Monitoring Suppl (ACCU-CHEK AVIVA PLUS) w/Device KIT USE TO CHECK BLOOD SUGAR  DAILY AS DIRECTED   FARXIGA 10 MG TABS tablet TAKE 1 TABLET BY MOUTH ONCE DAILY IN THE MORNING BEFORE BREAKFAST   gabapentin (NEURONTIN) 300 MG capsule TAKE 1 CAPSULE BY MOUTH AT BEDTIME FOR  NEUROPATHY   glipiZIDE (GLUCOTROL XL) 10 MG 24 hr tablet Take 1 tablet by mouth once daily with breakfast   ketoconazole (NIZORAL) 2 % shampoo USE ON SCALP TWICE A WEEK   lisinopril (ZESTRIL) 10 MG tablet Take 1 tablet by mouth once daily   metFORMIN (GLUCOPHAGE) 1000 MG tablet TAKE 1 TABLET BY MOUTH TWICE DAILY WITH MEALS   nitroGLYCERIN (NITROSTAT) 0.4 MG SL tablet DISSOLVE ONE TABLET UNDER THE TONGUE EVERY 5 MINUTES AS NEEDED FOR CHEST PAIN.  DO NOT EXCEED A TOTAL OF 3 DOSES IN 15 MINUTES   omeprazole (PRILOSEC) 40 MG capsule Take 1 capsule (40 mg total) by mouth in the morning and at bedtime. Take only as needed for heartburn and acid refux   pioglitazone (ACTOS) 30 MG tablet Take 1 tablet by mouth once daily   rosuvastatin (CRESTOR) 10 MG tablet Take 1 tablet (10 mg total) by mouth daily.   tamsulosin (FLOMAX) 0.4 MG CAPS capsule TAKE 1 CAPSULE BY MOUTH IN  THE EVENING    Allergies: No known allergies  Social History   Tobacco Use   Smoking status: Former    Packs/day: 1.00    Years: 5.00    Total pack years: 5.00    Types: Cigarettes    Quit date: 08/06/1972    Years since quitting: 49.9   Smokeless tobacco: Never  Vaping Use   Vaping Use: Never used  Substance Use Topics   Alcohol use: Yes    Alcohol/week: 2.0 - 3.0 standard drinks of alcohol    Types: 2 - 3 Glasses of wine per week    Comment: [place] in the evenings 4 days per week   Drug use: No    Family History  Problem Relation Age of Onset   Dementia Mother    Diabetes Mother    Cancer Father    Diabetes Sister    Stroke Sister    Dementia Sister    Diabetes Brother    Heart disease Other     Review of Systems: A 12-system review of systems was performed and was negative except as noted in the HPI.  --------------------------------------------------------------------------------------------------  Physical Exam: BP (!) 140/80 (BP Location: Left Arm)   Pulse 78   Ht _0  (1.93 m)   Wt 212  lb (96.2 kg)   SpO2 97%   BMI 25.81 kg/m  Repeat BP: 140/80  General:  NAD. Neck: No JVD or HJR. Lungs: Clear to auscultation bilaterally without wheezes or crackles. Heart: Regular rate and rhythm without murmurs, rubs, or gallops. Abdomen: Soft, nontender, nondistended. Extremities: No lower extremity edema.  EKG: Normal sinus rhythm with isolated PVC, left axis deviation, inferior infarct, and lateral T wave inversions.  Compared to prior tracing from 01/17/2022, PVC is now present.  Lateral T wave inversions are also more pronounced.  Lab Results  Component Value Date   WBC 3.8 (L) 08/04/2019   HGB 14.6 08/04/2019   HCT 44.5 08/04/2019   MCV 94.5 08/04/2019   PLT 186 08/04/2019    Lab Results  Component Value Date   NA 144 11/22/2021   K 4.7 11/22/2021   CL 107 (H) 11/22/2021   CO2 21 11/22/2021   BUN 16 11/22/2021   CREATININE 1.18 11/22/2021    GLUCOSE 147 (H) 11/22/2021   ALT 16 12/05/2020    Lab Results  Component Value Date   CHOL 102 11/22/2021   HDL 48 11/22/2021   LDLCALC 37 11/22/2021   TRIG 84 11/22/2021   CHOLHDL 2.1 11/22/2021    --------------------------------------------------------------------------------------------------  ASSESSMENT AND PLAN: Coronary artery disease: Stuart Henry continues to feel well without angina though he has noticed progressive decline in his stamina over the last several years.  He has been less active, which likely contributes to this.  He has not had any frank angina.  His EKG today shows more pronounced lateral T wave inversions as well as an isolated PVC but is otherwise unchanged from prior tracings.  Given his lack of symptoms and reassuring MPI in 2021, we will work on medical therapy and lifestyle patient's to help improve his blood pressure, glycemic control, and functional capacity.  Continue secondary prevention with aspirin and rosuvastatin.  LDL at goal on last check.  Hypertension: Blood pressure suboptimally controlled today.  We discussed the importance of sodium restriction and exercise.  We have agreed to increase lisinopril to 20 mg daily.  I will check a BMP today and again in 2 weeks.  Hyperlipidemia associated with type 2 diabetes mellitus: LDL and triglycerides well-controlled on last check in 11/2021.  Given intolerance of high intensity statin therapy in the past and excellent lipid values on last check, we will defer escalation of rosuvastatin.  Continue diabetes management per Dr. Caryn Section.  Follow-up: Return to clinic in 3 months.  Nelva Bush, MD 07/23/2022 10:11 AM

## 2022-07-23 ENCOUNTER — Other Ambulatory Visit
Admission: RE | Admit: 2022-07-23 | Discharge: 2022-07-23 | Disposition: A | Discharge status: Home / Self Care | Payer: Medicare Other | Source: Ambulatory Visit | Attending: Internal Medicine | Admitting: Internal Medicine

## 2022-07-23 ENCOUNTER — Ambulatory Visit: Payer: Medicare Other | Attending: Internal Medicine | Admitting: Internal Medicine

## 2022-07-23 ENCOUNTER — Encounter: Payer: Self-pay | Admitting: Internal Medicine

## 2022-07-23 VITALS — BP 140/80 | HR 78 | Ht 76.0 in | Wt 212.0 lb

## 2022-07-23 DIAGNOSIS — I10 Essential (primary) hypertension: Secondary | ICD-10-CM

## 2022-07-23 DIAGNOSIS — Z79899 Other long term (current) drug therapy: Secondary | ICD-10-CM | POA: Insufficient documentation

## 2022-07-23 DIAGNOSIS — I2510 Atherosclerotic heart disease of native coronary artery without angina pectoris: Secondary | ICD-10-CM | POA: Diagnosis not present

## 2022-07-23 DIAGNOSIS — E785 Hyperlipidemia, unspecified: Secondary | ICD-10-CM

## 2022-07-23 DIAGNOSIS — E1169 Type 2 diabetes mellitus with other specified complication: Secondary | ICD-10-CM

## 2022-07-23 LAB — BASIC METABOLIC PANEL
Anion gap: 12 (ref 5–15)
BUN: 27 mg/dL — ABNORMAL HIGH (ref 8–23)
CO2: 23 mmol/L (ref 22–32)
Calcium: 9.8 mg/dL (ref 8.9–10.3)
Chloride: 102 mmol/L (ref 98–111)
Creatinine, Ser: 1.21 mg/dL (ref 0.61–1.24)
GFR, Estimated: >60 mL/min (ref >60)
Glucose, Bld: 207 mg/dL — ABNORMAL HIGH (ref 70–99)
Potassium: 4.5 mmol/L (ref 3.5–5.1)
Sodium: 137 mmol/L (ref 135–145)

## 2022-07-23 MED ORDER — LISINOPRIL 20 MG PO TABS: 20.0000 mg | ORAL_TABLET | Freq: Every day | ORAL | 3 refills | Status: DC

## 2022-07-23 NOTE — Patient Instructions (Signed)
Medication Instructions:  Your physician recommends the following medication changes.  INCREASE: Lisinopril to 20 mg by mouth daily  *If you need a refill on your cardiac medications before your next appointment, please call your pharmacy*   Lab Work: Your provider would like for you to have labs today (BMP) then return in 2 weeks to have the following labs drawn: (BMP).   Please go to the [facility] entrance and check in at the front desk.  You do not need an appointment.  They are open from 7am-6 pm.  You will not need to be fasting.  If you have labs (blood work) drawn today and your tests are completely normal, you will receive your results only by: [place] (if you have MyChart) OR A paper copy in the mail If you have any lab test that is abnormal or we need to change your treatment, we will call you to review the results.   Testing/Procedures: None ordered today   Follow-Up: At [facility], you and your health needs are our priority.  As part of our continuing mission to provide you with exceptional heart care, we have created designated Provider Care Teams.  These Care Teams include your primary Cardiologist (physician) and Advanced Practice Providers (APPs -  Physician Assistants and Nurse Practitioners) who all work together to provide you with the care you need, when you need it.  We recommend signing up for the patient portal called "MyChart".  Sign up information is provided on this After Visit Summary.  MyChart is used to connect with patients for Virtual Visits (Telemedicine).  Patients are able to view lab/test results, encounter notes, upcoming appointments, etc.  Non-urgent messages can be sent to your provider as well.   To learn more about what you can do with MyChart, go to [email or web address].    Your next appointment:   3 month(s)  The format for your next appointment:   In Person  Provider:   You may see Nelva Bush, MD or  one of the following Advanced Practice Providers on your designated Care Team:   Murray Hodgkins, NP Christell Faith, PA-C Cadence Kathlen Mody, PA-C Gerrie Nordmann, NP

## 2022-08-01 ENCOUNTER — Other Ambulatory Visit: Payer: Self-pay | Admitting: Internal Medicine

## 2022-08-06 ENCOUNTER — Other Ambulatory Visit
Admission: RE | Admit: 2022-08-06 | Discharge: 2022-08-06 | Disposition: A | Discharge status: Home / Self Care | Payer: Medicare Other | Attending: Internal Medicine | Admitting: Internal Medicine

## 2022-08-06 DIAGNOSIS — Z79899 Other long term (current) drug therapy: Secondary | ICD-10-CM | POA: Diagnosis present

## 2022-08-06 LAB — BASIC METABOLIC PANEL
Anion gap: 9 (ref 5–15)
BUN: 23 mg/dL (ref 8–23)
CO2: 25 mmol/L (ref 22–32)
Calcium: 8.5 mg/dL — ABNORMAL LOW (ref 8.9–10.3)
Chloride: 103 mmol/L (ref 98–111)
Creatinine, Ser: 1.31 mg/dL — ABNORMAL HIGH (ref 0.61–1.24)
GFR, Estimated: 55 mL/min — ABNORMAL LOW (ref >60)
Glucose, Bld: 252 mg/dL — ABNORMAL HIGH (ref 70–99)
Potassium: 4.4 mmol/L (ref 3.5–5.1)
Sodium: 137 mmol/L (ref 135–145)

## 2022-08-07 ENCOUNTER — Other Ambulatory Visit: Payer: Self-pay | Admitting: Internal Medicine

## 2022-08-25 ENCOUNTER — Encounter: Payer: Self-pay | Admitting: Family Medicine

## 2022-08-25 ENCOUNTER — Ambulatory Visit: Payer: Medicare Other | Admitting: Family Medicine

## 2022-08-25 VITALS — BP 177/80 | HR 64 | Temp 98.2°F | Ht 76.0 in | Wt 220.0 lb

## 2022-08-25 DIAGNOSIS — E785 Hyperlipidemia, unspecified: Secondary | ICD-10-CM

## 2022-08-25 DIAGNOSIS — I2510 Atherosclerotic heart disease of native coronary artery without angina pectoris: Secondary | ICD-10-CM | POA: Diagnosis not present

## 2022-08-25 DIAGNOSIS — E1129 Type 2 diabetes mellitus with other diabetic kidney complication: Secondary | ICD-10-CM | POA: Diagnosis not present

## 2022-08-25 DIAGNOSIS — E1169 Type 2 diabetes mellitus with other specified complication: Secondary | ICD-10-CM

## 2022-08-25 DIAGNOSIS — R809 Proteinuria, unspecified: Secondary | ICD-10-CM | POA: Diagnosis not present

## 2022-08-25 DIAGNOSIS — I10 Essential (primary) hypertension: Secondary | ICD-10-CM | POA: Diagnosis not present

## 2022-08-25 LAB — POCT GLYCOSYLATED HEMOGLOBIN (HGB A1C)
Est. average glucose Bld gHb Est-mCnc: 203
Hemoglobin A1C: 8.7 % — AB (ref 4.0–5.6)

## 2022-08-25 MED ORDER — OZEMPIC (0.25 OR 0.5 MG/DOSE) 2 MG/3ML ~~LOC~~ SOPN: PEN_INJECTOR | SUBCUTANEOUS | 0 refills | Status: DC

## 2022-08-25 NOTE — Progress Notes (Signed)
Established patient visit   Patient: Stuart Henry   DOB: September 13, 1940   82 y.o. Male  MRN: [id number] Visit Date: 08/25/2022  Today's healthcare provider: [name], MD   Chief Complaint  Patient presents with   Diabetes   Hyperlipidemia   Hypertension   Subjective      Here to follow up on diabetes.  Lab Results  Component Value Date   HGBA1C 8.7 (A) 04/21/2022   HGBA1C 8.1 (H) 11/22/2021   HGBA1C 8.7 (A) 08/12/2021   States had been improving diet after last visit and sugars had been doing much better, mostly in the 120s and 130s, but has been has been in the upper 100s the last several    Dr End increased his dose of lisinopril in December BP Readings from Last 5 Encounters:  08/25/22 (!) 177/80  07/23/22 (!) 140/80  06/20/22 (!) 142/79  04/21/22 137/74  01/17/22 120/68   He is tolerating well and follow up labs in January with slight increase in his creatinine.  Lab Results  Component Value Date   NA 137 08/06/2022   K 4.4 08/06/2022   CO2 25 08/06/2022   GLUCOSE 252 (H) 08/06/2022   BUN 23 08/06/2022   CREATININE 1.31 (H) 08/06/2022   CALCIUM 8.5 (L) 08/06/2022   EGFR 63 11/22/2021   GFRNONAA 55 (L) 08/06/2022   d Medications: Outpatient Medications Prior to Visit  Medication Sig   ACCU-CHEK AVIVA PLUS test strip USE AS INSTRUCTED TO CHECK  BLOOD SUGAR ONCE DAILY FOR  TYPE 2 DIABETES   allopurinol (ZYLOPRIM) 100 MG tablet Take 1 tablet by mouth once daily   aspirin EC 81 MG tablet Take 1 tablet (81 mg total) by mouth daily.   Blood Glucose Monitoring Suppl (ACCU-CHEK AVIVA PLUS) w/Device KIT USE TO CHECK BLOOD SUGAR  DAILY AS DIRECTED   FARXIGA 10 MG TABS tablet TAKE 1 TABLET BY MOUTH ONCE DAILY IN THE MORNING BEFORE BREAKFAST   gabapentin (NEURONTIN) 300 MG capsule TAKE 1 CAPSULE BY MOUTH AT BEDTIME FOR  NEUROPATHY   glipiZIDE (GLUCOTROL XL) 10 MG 24 hr tablet Take 1 tablet by mouth once daily with breakfast   ketoconazole (NIZORAL) 2 %  shampoo USE ON SCALP TWICE A WEEK   lisinopril (ZESTRIL) 20 MG tablet Take 1 tablet (20 mg total) by mouth daily.   metFORMIN (GLUCOPHAGE) 1000 MG tablet TAKE 1 TABLET BY MOUTH TWICE DAILY WITH MEALS   nitroGLYCERIN (NITROSTAT) 0.4 MG SL tablet DISSOLVE ONE TABLET UNDER THE TONGUE EVERY 5 MINUTES AS NEEDED FOR CHEST PAIN.  DO NOT EXCEED A TOTAL OF 3 DOSES IN 15 MINUTES   omeprazole (PRILOSEC) 40 MG capsule Take 1 capsule (40 mg total) by mouth in the morning and at bedtime. Take only as needed for heartburn and acid refux   pioglitazone (ACTOS) 30 MG tablet Take 1 tablet by mouth once daily   rosuvastatin (CRESTOR) 10 MG tablet Take 1 tablet by mouth once daily   tamsulosin (FLOMAX) 0.4 MG CAPS capsule TAKE 1 CAPSULE BY MOUTH IN THE EVENING   No facility-administered medications prior to visit.    Review of Systems  Constitutional:  Negative for appetite change, chills and fever.  Respiratory:  Negative for chest tightness, shortness of breath and wheezing.   Cardiovascular:  Negative for chest pain and palpitations.  Gastrointestinal:  Negative for abdominal pain, nausea and vomiting.       Objective    BP (!) 177/80 (BP Location:  Left Arm, Patient Position: Sitting, Cuff Size: Large)   Pulse 64   Temp 98.2 F (36.8 C)   Ht '6\' 4"'$  (1.93 m)   Wt 220 lb (99.8 kg)   SpO2 98%   BMI 26.78 kg/m    Physical Exam  General appearance: Well developed, well nourished male, cooperative and in no acute distress Head: Normocephalic, without obvious abnormality, atraumatic Respiratory: Respirations even and unlabored, normal respiratory rate Extremities: All extremities are intact.  Skin: Skin color, texture, turgor normal. No rashes seen  Psych: Appropriate mood and affect. Neurologic: Mental status: Alert, oriented to person, place, and time, thought content appropriate.   Results for orders placed or performed in visit on 08/25/22  POCT glycosylated hemoglobin (Hb A1C)  Result Value  Ref Range   Hemoglobin A1C 8.7 (A) 4.0 - 5.6 %   Est. average glucose Bld gHb Est-mCnc 203     Assessment & Plan     1. Type 2 diabetes mellitus with microalbuminuria, without long-term current use of insulin (HCC) Worsening A1c due to less exercise, weight gain, and not following strict diabetic diet. Extensively counseled on risk/benefits of various medications to improve glycemic control. He is interested in insulin due to it's affordability, but counseled that certain GLP1 agonist are more effective and reducing CV events. He is will to try samples and will start Semaglutide,0.25 or 0.'5MG'$ /DOS, (OZEMPIC, 0.25 OR 0.5 MG/DOSE,) 2 MG/3ML SOPN;  Start 0.'25mg'$  once a week  Dispense: 3 mL; Refill: 0  Counseled on common side effects. If tolerating well will increase to 0.'5mg'$  sample pen when current pen is out. He has CAD but no h/o CHF so he would like to d/c [place] due to cost of semaglutide does a better job at controlling blood sugar.   2. Primary hypertension Lisinopril dose was doubled by his cardiologist in December. Bps are usually better controlled than today's readings. Continue current medications.  Consider adding amlodipine or hctz if not improving at follow up.   3. Hyperlipidemia associated with type 2 diabetes mellitus ([place]) He is tolerating rosuvastatin well with no adverse effects.    4. Coronary artery disease involving native coronary artery of native heart without angina pectoris Asymptomatic. Compliant with medication.  Continue aggressive risk factor modification.  Continue routine follow up with Dr. Saunders Revel      The entirety of the information documented in the History of Present Illness, Review of Systems and Physical Exam were personally obtained by me. Portions of this information were initially documented by the CMA and reviewed by me for thoroughness and accuracy.     [name], MD  [place] [phone] (phone) [phone]  (fax)  [place]

## 2022-09-02 ENCOUNTER — Other Ambulatory Visit: Payer: Self-pay | Admitting: Family Medicine

## 2022-09-02 DIAGNOSIS — N401 Enlarged prostate with lower urinary tract symptoms: Secondary | ICD-10-CM

## 2022-09-02 NOTE — Telephone Encounter (Signed)
Requested Prescriptions  Pending Prescriptions Disp Refills   tamsulosin (FLOMAX) 0.4 MG CAPS capsule [Pharmacy Med Name: Tamsulosin HCl 0.4 MG Oral Capsule] 90 capsule 2    Sig: TAKE 1 CAPSULE BY MOUTH IN THE EVENING     Urology: Alpha-Adrenergic Blocker Failed - 09/02/2022 10:32 AM      Failed - Last BP in normal range    BP Readings from Last 1 Encounters:  08/25/22 (!) 177/80         Passed - PSA in normal range and within 360 days    PSA  Date Value Ref Range Status  03/10/2014 0.5  Final   Prostate Specific Ag, Serum  Date Value Ref Range Status  04/21/2022 0.4 0.0 - 4.0 ng/mL Final    Comment:    Roche ECLIA methodology. According to the American Urological Association, Serum PSA should decrease and remain at undetectable levels after radical prostatectomy. The AUA defines biochemical recurrence as an initial PSA value 0.2 ng/mL or greater followed by a subsequent confirmatory PSA value 0.2 ng/mL or greater. Values obtained with different assay methods or kits cannot be used interchangeably. Results cannot be interpreted as absolute evidence of the presence or absence of malignant disease.          Passed - Valid encounter within last 12 months    Recent Outpatient Visits           1 week ago Type 2 diabetes mellitus with microalbuminuria, without long-term current use of insulin ([place])   [place] Birdie Sons, MD   2 months ago Skin lesion of neck   [place], Donald E, MD   4 months ago Annual physical exam   [facility] Birdie Sons, MD   9 months ago Type 2 diabetes mellitus with complication, without long-term current use of insulin ([place])   [place], Donald E, MD   1 year ago Type 2 diabetes mellitus with complication, without long-term current use of insulin ([place])   [place], Kirstie Peri, MD        Future Appointments             In 1 month End, Harrell Gave, MD [place] at [place]   In 2 months Fisher, Kirstie Peri, MD [facility], [place]

## 2022-09-22 ENCOUNTER — Telehealth: Payer: Self-pay | Admitting: Family Medicine

## 2022-09-22 DIAGNOSIS — E1129 Type 2 diabetes mellitus with other diabetic kidney complication: Secondary | ICD-10-CM

## 2022-09-22 MED ORDER — OZEMPIC (0.25 OR 0.5 MG/DOSE) 2 MG/3ML ~~LOC~~ SOPN: 0.5000 mg | PEN_INJECTOR | SUBCUTANEOUS | 1 refills | Status: DC

## 2022-09-22 NOTE — Telephone Encounter (Signed)
Have sent prescription for the 0.1m dose to [facility]

## 2022-09-22 NOTE — Telephone Encounter (Signed)
Advised 

## 2022-09-22 NOTE — Telephone Encounter (Signed)
The patient called in stating the [place] has worked wonders. He took the 0.25 and has one dose left still to take. He states Dr Caryn Section says he would give him another pen at the office. If the provider thinks it is best for him to stay on the 0.25 and get the same pen that is fine as he just wants to do what the provider thinks is best. He is requesting as long as the provider believes it is best to go to the 0.5. He states the pen he has does say it is good for the 0.25 and 0.5 He does prefer his meds to go to   [facility] ([place], [place] Phone: [phone]  Fax: [phone]     Please assist patient further.

## 2022-10-08 ENCOUNTER — Ambulatory Visit: Payer: Medicare Other | Admitting: Internal Medicine

## 2022-11-04 ENCOUNTER — Other Ambulatory Visit: Payer: Self-pay | Admitting: Internal Medicine

## 2022-11-10 LAB — HM DIABETES EYE EXAM

## 2022-11-24 ENCOUNTER — Encounter: Payer: Self-pay | Admitting: Family Medicine

## 2022-11-24 ENCOUNTER — Ambulatory Visit: Payer: Medicare Other | Admitting: Family Medicine

## 2022-11-24 VITALS — BP 148/88 | HR 89 | Ht 75.0 in | Wt 215.0 lb

## 2022-11-24 DIAGNOSIS — R809 Proteinuria, unspecified: Secondary | ICD-10-CM | POA: Diagnosis not present

## 2022-11-24 DIAGNOSIS — E1129 Type 2 diabetes mellitus with other diabetic kidney complication: Secondary | ICD-10-CM | POA: Diagnosis not present

## 2022-11-24 DIAGNOSIS — I10 Essential (primary) hypertension: Secondary | ICD-10-CM | POA: Diagnosis not present

## 2022-11-24 LAB — POCT GLYCOSYLATED HEMOGLOBIN (HGB A1C)
Est. average glucose Bld gHb Est-mCnc: 174
Hemoglobin A1C: 7.7 % — AB (ref 4.0–5.6)

## 2022-11-24 NOTE — Patient Instructions (Signed)
.   Please review the attached list of medications and notify my office if there are any errors.   . Please bring all of your medications to every appointment so we can make sure that our medication list is the same as yours.   

## 2022-11-24 NOTE — Progress Notes (Signed)
Established patient visit   Patient: Stuart Henry   DOB: 1940-08-17   82 y.o. Male  MRN: [id number] Visit Date: 11/24/2022  Today's healthcare provider: Mila Merry, MD   Chief Complaint  Patient presents with   Diabetes   Hypertension   Subjective    Here today to follow up diabetes and hypertension since starting Ozempic at last visit in January when his A1c was 8.7 and blood pressure was 177/80. He feels well. He did stop taking [place] about the same time due to expense. His sugars have been labile, but no hypoglycemic. Denies constipation or diarrhea, but has had more gas pains since starting Ozempic. States alcohol doesn't taste good anymore and is drinking much less.   Medications: Outpatient Medications Prior to Visit  Medication Sig   ACCU-CHEK AVIVA PLUS test strip USE AS INSTRUCTED TO CHECK  BLOOD SUGAR ONCE DAILY FOR  TYPE 2 DIABETES   allopurinol (ZYLOPRIM) 100 MG tablet Take 1 tablet by mouth once daily   aspirin EC 81 MG tablet Take 1 tablet (81 mg total) by mouth daily.   Blood Glucose Monitoring Suppl (ACCU-CHEK AVIVA PLUS) w/Device KIT USE TO CHECK BLOOD SUGAR  DAILY AS DIRECTED   gabapentin (NEURONTIN) 300 MG capsule TAKE 1 CAPSULE BY MOUTH AT BEDTIME FOR  NEUROPATHY   glipiZIDE (GLUCOTROL XL) 10 MG 24 hr tablet Take 1 tablet by mouth once daily with breakfast   ketoconazole (NIZORAL) 2 % shampoo USE ON SCALP TWICE A WEEK   lisinopril (ZESTRIL) 20 MG tablet Take 1 tablet (20 mg total) by mouth daily.   metFORMIN (GLUCOPHAGE) 1000 MG tablet TAKE 1 TABLET BY MOUTH TWICE DAILY WITH MEALS   nitroGLYCERIN (NITROSTAT) 0.4 MG SL tablet DISSOLVE ONE TABLET UNDER THE TONGUE EVERY 5 MINUTES AS NEEDED FOR CHEST PAIN.  DO NOT EXCEED A TOTAL OF 3 DOSES IN 15 MINUTES   omeprazole (PRILOSEC) 40 MG capsule Take 1 capsule (40 mg total) by mouth in the morning and at bedtime. Take only as needed for heartburn and acid refux   pioglitazone (ACTOS) 30 MG tablet Take 1  tablet by mouth once daily   rosuvastatin (CRESTOR) 10 MG tablet Take 1 tablet by mouth once daily   Semaglutide,0.25 or 0.5MG /DOS, (OZEMPIC, 0.25 OR 0.5 MG/DOSE,) 2 MG/3ML SOPN Inject 0.5 mg as directed once a week.   tamsulosin (FLOMAX) 0.4 MG CAPS capsule TAKE 1 CAPSULE BY MOUTH IN THE EVENING   FARXIGA 10 MG TABS tablet TAKE 1 TABLET BY MOUTH ONCE DAILY IN THE MORNING BEFORE BREAKFAST (Patient not taking: Reported on 11/24/2022)   No facility-administered medications prior to visit.    Review of Systems  Constitutional:  Negative for appetite change, chills and fever.  Respiratory:  Negative for chest tightness, shortness of breath and wheezing.   Cardiovascular:  Negative for chest pain and palpitations.  Gastrointestinal:  Positive for abdominal distention. Negative for constipation, diarrhea, nausea and vomiting.       Objective    BP (!) 148/88   Pulse 89   Ht 6\' 3"  (1.905 m)   Wt 215 lb (97.5 kg)   SpO2 97%   BMI 26.87 kg/m      Results for orders placed or performed in visit on 11/24/22  POCT HgB A1C  Result Value Ref Range   Hemoglobin A1C 7.7 (A) 4.0 - 5.6 %   Est. average glucose Bld gHb Est-mCnc 174     Assessment & Plan  1. Type 2 diabetes mellitus with diabetic microalbuminuria, without long-term current use of insulin Doing well since starting Ozempic and tolerating 0.5mg  with some mild gast pains. Aic drop 1 point since last visit and has discontinued [place] due to expense.   2. Primary hypertension Not quite at goal. May see some weight loss and BP improvement with Ozempic. Consider diltiazem or spironolactone if not improved at follow up .      Future Appointments  Date Time Provider Department Center  11/27/2022  8:20 AM End, Cristal Deer, MD [facility] None  04/27/2023  8:40 AM Sherrie Mustache, Demetrios Isaacs, MD [facility]    The entirety of the information documented in the History of Present Illness, Review of Systems and Physical Exam were personally  obtained by me. Portions of this information were initially documented by the CMA and reviewed by me for thoroughness and accuracy.     Mila Merry, MD  [facility] [phone] (phone) [phone] (fax)  [facility]

## 2022-11-27 ENCOUNTER — Encounter: Payer: Self-pay | Admitting: Internal Medicine

## 2022-11-27 ENCOUNTER — Ambulatory Visit: Payer: Medicare Other | Attending: Internal Medicine | Admitting: Internal Medicine

## 2022-11-27 VITALS — BP 170/90 | HR 64 | Ht 76.0 in | Wt 214.1 lb

## 2022-11-27 DIAGNOSIS — E1169 Type 2 diabetes mellitus with other specified complication: Secondary | ICD-10-CM

## 2022-11-27 DIAGNOSIS — I10 Essential (primary) hypertension: Secondary | ICD-10-CM | POA: Diagnosis not present

## 2022-11-27 DIAGNOSIS — I2510 Atherosclerotic heart disease of native coronary artery without angina pectoris: Secondary | ICD-10-CM

## 2022-11-27 DIAGNOSIS — Z79899 Other long term (current) drug therapy: Secondary | ICD-10-CM

## 2022-11-27 DIAGNOSIS — I255 Ischemic cardiomyopathy: Secondary | ICD-10-CM

## 2022-11-27 DIAGNOSIS — E785 Hyperlipidemia, unspecified: Secondary | ICD-10-CM

## 2022-11-27 MED ORDER — LISINOPRIL 40 MG PO TABS: 40.0000 mg | ORAL_TABLET | Freq: Every day | ORAL | 3 refills | Status: DC

## 2022-11-27 NOTE — Progress Notes (Signed)
Follow-up Outpatient Visit Date: 11/27/2022  Primary Care Provider: Malva Limes, MD [address] [place] [address]  Chief Complaint: Follow-up coronary artery disease and ischemic cardiomyopathy  HPI:  Mr. Enck is a 82 y.o. 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  I last saw him in December, at which time he was concerned by continued decline in his stamina.  We agreed to defer additional testing in favor of increasing his exercise.  Due to suboptimal blood pressure control, lisinopril was increased to 20 mg daily.  Today, Mr. Ashmead reports that he is feeling fairly well.  He still has some fatigue especially in his legs after playing 18 holes of golf, though overall he is feeling a little better.  He has not had any chest pain, shortness of breath, palpitations, lightheadedness, or edema.  He notes that he has been switched from dapagliflozin to semaglutide by Dr. Sherrie Mustache with improvement in his hemoglobin A1c (most recently 7.7%).  He is trying to exercise regularly.  Home blood pressures are usually in the 140s systolic.  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 RCA with 25% distal stenosis. LVEF 55-60% with normal LVEDP.   CV Surgery: CABG (09/24/16, Dr. Dorris Fetch): LIMA to LAD, SVG to D1, and sequential SVG to first and second branches of ramus intermedius.   EP Procedures and Devices: None   Non-Invasive Evaluation(s): Exercise MPI (02/02/2020): Low risk study without ischemia or scar.  LVEF 64%. Exercise MPI (04/07/2018): Interpreted as intermediate risk with possible apical anterior and apical ischemia and small inferolateral infarct.  Normal LVEF.  Greater than 1 mm ST depression at stress.  I personally reviewed the images and believe that the apical defect represents artifact, as there is normal perfusion in this region on the non-attenuation corrected images.  Only perfusion abnormality is a small apical inferior/lateral defect that may represent prior infarct, consistent with a low risk study. Transthoracic echocardiogram (04/23/17): Normal LV size. LVEF 55-60% with anteroseptal hypokinesis (? related to prior cardiac surgery). Normal diastolic function. Borderline dilated aortic root (3.7 cm). Mild MR. Normal RV size and function. Normal pulmonary artery pressure. Carotid Doppler (09/23/16): Mild (1-39%) stenoses involving the left and right internal carotid arteries. Vertebral arteries with antegrade flow. ABIs (09/23/16): Right 1.98 and the left 1.49 suggestive of medial calcification. Transthoracic echocardiogram (09/19/16): Normal LV size with mildly reduced contraction (EF 45-50%) with basal and mid anterior and anteroseptal hypokinesis. Grade 1 diastolic dysfunction. Mildly dilated left atrium. Normal RV size and function.  Recent CV Pertinent Labs: Lab Results  Component Value Date   CHOL 102 11/22/2021   HDL 48 11/22/2021   LDLCALC 37 11/22/2021   TRIG 84 11/22/2021   CHOLHDL 2.1 11/22/2021   CHOLHDL 3.1 11/09/2019   INR 1.17 09/29/2016   BNP 60.0 11/16/2017   K 4.4 08/06/2022   MG 1.9 09/25/2016   BUN 23  08/06/2022  BUN 16 11/22/2021   CREATININE 1.31 (H) 08/06/2022    Past medical and surgical history were reviewed and updated in EPIC.  Current Meds  Medication Sig   ACCU-CHEK AVIVA PLUS test strip USE AS INSTRUCTED TO CHECK  BLOOD SUGAR ONCE DAILY FOR  TYPE 2 DIABETES   allopurinol (ZYLOPRIM) 100 MG tablet Take 1 tablet by mouth once daily   aspirin EC 81 MG tablet Take 1 tablet (81 mg total) by mouth daily.   Blood Glucose Monitoring Suppl (ACCU-CHEK AVIVA PLUS) w/Device KIT USE TO CHECK BLOOD SUGAR  DAILY AS DIRECTED   gabapentin (NEURONTIN) 300 MG capsule TAKE 1 CAPSULE BY MOUTH AT BEDTIME FOR  NEUROPATHY   glipiZIDE (GLUCOTROL XL) 10 MG 24 hr tablet Take 1 tablet by mouth once daily with breakfast   ketoconazole (NIZORAL) 2 % shampoo USE ON SCALP TWICE A WEEK   lisinopril (ZESTRIL) 20 MG tablet Take 1 tablet (20 mg total) by mouth daily.   metFORMIN (GLUCOPHAGE) 1000 MG tablet TAKE 1 TABLET BY MOUTH TWICE DAILY WITH MEALS   nitroGLYCERIN (NITROSTAT) 0.4 MG SL tablet DISSOLVE ONE TABLET UNDER THE TONGUE EVERY 5 MINUTES AS NEEDED FOR CHEST PAIN.  DO NOT EXCEED A TOTAL OF 3 DOSES IN 15 MINUTES   omeprazole (PRILOSEC) 40 MG capsule Take 1 capsule (40 mg total) by mouth in the morning and at bedtime. Take only as needed for heartburn and acid refux   pioglitazone (ACTOS) 30 MG tablet Take 1 tablet by mouth once daily   rosuvastatin (CRESTOR) 10 MG tablet Take 1 tablet by mouth once daily   Semaglutide,0.25 or 0.5MG /DOS, (OZEMPIC, 0.25 OR 0.5 MG/DOSE,) 2 MG/3ML SOPN Inject 0.5 mg as directed once a week.   tamsulosin (FLOMAX) 0.4 MG CAPS capsule TAKE 1 CAPSULE BY MOUTH IN THE EVENING    Allergies: No known allergies  Social History   Tobacco Use   Smoking status: Former    Packs/day: 1.00    Years: 5.00    Additional pack years: 0.00    Total pack years: 5.00    Types: Cigarettes    Quit date: 08/06/1972    Years since quitting: 50.3   Smokeless tobacco: Never  Vaping Use    Vaping Use: Never used  Substance Use Topics   Alcohol use: Yes    Alcohol/week: 2.0 - 3.0 standard drinks of alcohol    Types: 2 - 3 Glasses of wine per week    Comment: [place] in the evenings 4 days per week   Drug use: No    Family History  Problem Relation Age of Onset   Dementia Mother    Diabetes Mother    Cancer Father    Diabetes Sister    Stroke Sister    Dementia Sister    Diabetes Brother    Heart disease Other     Review of Systems: A 12-system review of systems was performed and was negative except as noted in the HPI.  --------------------------------------------------------------------------------------------------  Physical Exam: BP (!) 164/80 (BP Location: Left Arm, Patient Position: Sitting, Cuff Size: Normal)   Pulse 64   Ht  (1.93 m)   Wt 214 lb 2 oz (97.1 kg)   SpO2 98%   BMI 26.06 kg/m  Repeat BP: 170/90  General:  NAD. Neck: No JVD or HJR. Lungs: Clear to auscultation bilaterally without wheezes or crackles. Heart: Regular rate and rhythm with 1/6 systolic murmur.  No rubs or gallops. Abdomen: Soft, nontender, nondistended. Extremities: No lower  extremity edema.  EKG: Normal sinus rhythm with first-degree AV block and inferior infarct.  Compared to prior tracing from 07/23/2022, lateral T wave inversions are less pronounced.  PR interval has lengthened.  PVCs are no longer present.  Lab Results  Component Value Date   WBC 3.8 (L) 08/04/2019   HGB 14.6 08/04/2019   HCT 44.5 08/04/2019   MCV 94.5 08/04/2019   PLT 186 08/04/2019    Lab Results  Component Value Date   NA 137 08/06/2022   K 4.4 08/06/2022   CL 103 08/06/2022   CO2 25 08/06/2022   BUN 23 08/06/2022   CREATININE 1.31 (H) 08/06/2022   GLUCOSE 252 (H) 08/06/2022   ALT 16 12/05/2020    Lab Results  Component Value Date   CHOL 102 11/22/2021   HDL 48 11/22/2021   LDLCALC 37 11/22/2021   TRIG 84 11/22/2021   CHOLHDL 2.1 11/22/2021     --------------------------------------------------------------------------------------------------  ASSESSMENT AND PLAN: Coronary artery disease: No angina reported.  Continue current medications for secondary prevention.  Ischemic cardiomyopathy: Mr. Santerre appears euvolemic with NYHA class II symptoms and normalized LVEF on last echo in 2018 and MPI in 2021.  Increase lisinopril to 40 mg daily due to suboptimal blood pressure control.  Defer beta-blocker in the setting of low normal resting heart rate and first-degree AV block.  Hypertension: Blood pressure suboptimally controlled again today.  We have agreed to increase lisinopril to 40 mg daily with CMP in 2 weeks to ensure stable renal function and electrolytes.  I encouraged Mr. Menter to alert [place] if his blood pressure remains consistently above 130/80.  Hyperlipidemia associated with type 2 diabetes mellitus: Lipids well-controlled on last check a year ago.  Will plan to repeat a fasting lipid panel Mr. Turnage returns for his CMP and 2 weeks.  Ongoing management of diabetes mellitus per Dr. Sherrie Mustache, which has improved following recent transition from dapagliflozin to semaglutide.  Follow-up: Return to clinic in 3 months to reevaluate blood pressure.  Yvonne Kendall, MD 11/27/2022 8:20 AM

## 2022-11-27 NOTE — Patient Instructions (Signed)
Medication Instructions:  Your physician recommends the following medication changes.  INCREASE: Increase your Lisinopril to 40 mg daily.    *If you need a refill on your cardiac medications before your next appointment, please call your pharmacy*   Lab Work: Your provider would like for you to return in 2 weeks to have the following labs drawn: CMP, Lipid Panel.   Please go to the [facility] entrance and check in at the front desk.  You do not need an appointment.  They are open from 7am-6 pm.  You will need to be fasting.  If you have labs (blood work) drawn today and your tests are completely normal, you will receive your results only by: MyChart Message (if you have MyChart) OR A paper copy in the mail If you have any lab test that is abnormal or we need to change your treatment, we will call you to review the results.     Follow-Up: At [facility], you and your health needs are our priority.  As part of our continuing mission to provide you with exceptional heart care, we have created designated Provider Care Teams.  These Care Teams include your primary Cardiologist (physician) and Advanced Practice Providers (APPs -  Physician Assistants and Nurse Practitioners) who all work together to provide you with the care you need, when you need it.  We recommend signing up for the patient portal called "MyChart".  Sign up information is provided on this After Visit Summary.  MyChart is used to connect with patients for Virtual Visits (Telemedicine).  Patients are able to view lab/test results, encounter notes, upcoming appointments, etc.  Non-urgent messages can be sent to your provider as well.   To learn more about what you can do with MyChart, go to [email or web address].    Your next appointment:   3 month(s)  Provider:   You may see Yvonne Kendall, MD or one of the following Advanced Practice Providers on your designated Care Team:   Nicolasa Ducking,  NP Eula Listen, PA-C Cadence Fransico Michael, PA-C Charlsie Quest, NP

## 2022-12-19 ENCOUNTER — Other Ambulatory Visit
Admission: RE | Admit: 2022-12-19 | Discharge: 2022-12-19 | Disposition: A | Discharge status: Home / Self Care | Payer: Medicare Other | Attending: Internal Medicine | Admitting: Internal Medicine

## 2022-12-19 DIAGNOSIS — Z79899 Other long term (current) drug therapy: Secondary | ICD-10-CM | POA: Diagnosis present

## 2022-12-19 LAB — COMPREHENSIVE METABOLIC PANEL
ALT: 24 U/L (ref 0–44)
AST: 20 U/L (ref 15–41)
Albumin: 4.4 g/dL (ref 3.5–5.0)
Alkaline Phosphatase: 44 U/L (ref 38–126)
Anion gap: 11 (ref 5–15)
BUN: 16 mg/dL (ref 8–23)
CO2: 23 mmol/L (ref 22–32)
Calcium: 9.2 mg/dL (ref 8.9–10.3)
Chloride: 101 mmol/L (ref 98–111)
Creatinine, Ser: 1.13 mg/dL (ref 0.61–1.24)
GFR, Estimated: >60 mL/min (ref >60)
Glucose, Bld: 281 mg/dL — ABNORMAL HIGH (ref 70–99)
Potassium: 4.2 mmol/L (ref 3.5–5.1)
Sodium: 135 mmol/L (ref 135–145)
Total Bilirubin: 1.1 mg/dL (ref 0.3–1.2)
Total Protein: 6.8 g/dL (ref 6.5–8.1)

## 2022-12-19 LAB — LIPID PANEL
Cholesterol: 96 mg/dL (ref 0–200)
HDL: 35 mg/dL — ABNORMAL LOW (ref >40)
LDL Cholesterol: 30 mg/dL (ref 0–99)
Total CHOL/HDL Ratio: 2.7 RATIO
Triglycerides: 156 mg/dL — ABNORMAL HIGH (ref <150)
VLDL: 31 mg/dL (ref 0–40)

## 2022-12-27 ENCOUNTER — Other Ambulatory Visit: Payer: Self-pay | Admitting: Family Medicine

## 2022-12-27 ENCOUNTER — Other Ambulatory Visit: Payer: Self-pay | Admitting: Internal Medicine

## 2022-12-27 DIAGNOSIS — E1140 Type 2 diabetes mellitus with diabetic neuropathy, unspecified: Secondary | ICD-10-CM

## 2023-01-01 ENCOUNTER — Other Ambulatory Visit: Payer: Self-pay | Admitting: Family Medicine

## 2023-01-01 DIAGNOSIS — E1129 Type 2 diabetes mellitus with other diabetic kidney complication: Secondary | ICD-10-CM

## 2023-03-02 ENCOUNTER — Other Ambulatory Visit: Payer: Self-pay | Admitting: Family Medicine

## 2023-03-02 DIAGNOSIS — N401 Enlarged prostate with lower urinary tract symptoms: Secondary | ICD-10-CM

## 2023-03-03 NOTE — Telephone Encounter (Signed)
Requested by interface surescripts. Last refill 02/25/23. Too soon for another refill. # 90 2 refills.  Requested Prescriptions  Refused Prescriptions Disp Refills   tamsulosin (FLOMAX) 0.4 MG CAPS capsule [Pharmacy Med Name: Tamsulosin HCl 0.4 MG Oral Capsule] 90 capsule 0    Sig: TAKE 1 CAPSULE BY MOUTH IN THE EVENING     Urology: Alpha-Adrenergic Blocker Failed - 03/02/2023  8:57 AM      Failed - Last BP in normal range    BP Readings from Last 1 Encounters:  11/27/22 (!) 170/90         Passed - PSA in normal range and within 360 days    PSA  Date Value Ref Range Status  03/10/2014 0.5  Final   Prostate Specific Ag, Serum  Date Value Ref Range Status  04/21/2022 0.4 0.0 - 4.0 ng/mL Final    Comment:    Roche ECLIA methodology. According to the American Urological Association, Serum PSA should decrease and remain at undetectable levels after radical prostatectomy. The AUA defines biochemical recurrence as an initial PSA value 0.2 ng/mL or greater followed by a subsequent confirmatory PSA value 0.2 ng/mL or greater. Values obtained with different assay methods or kits cannot be used interchangeably. Results cannot be interpreted as absolute evidence of the presence or absence of malignant disease.          Passed - Valid encounter within last 12 months    Recent Outpatient Visits           3 months ago Type 2 diabetes mellitus with diabetic microalbuminuria, without long-term current use of insulin (HCC)   [place] [facility] Malva Limes, MD   6 months ago Type 2 diabetes mellitus with microalbuminuria, without long-term current use of insulin (HCC)   [place] Malva Limes, MD   8 months ago Skin lesion of neck   [facility] Malva Limes, MD   10 months ago Annual physical exam   [facility] Malva Limes, MD   1 year ago Type 2 diabetes mellitus with  complication, without long-term current use of insulin (HCC)    [facility] Sherrie Mustache, Demetrios Isaacs, MD       Future Appointments             In 1 week End, Cristal Deer, MD [facility] [place]   In 1 month Fisher, Demetrios Isaacs, MD [facility]

## 2023-03-11 ENCOUNTER — Encounter: Payer: Self-pay | Admitting: Internal Medicine

## 2023-03-11 ENCOUNTER — Ambulatory Visit: Payer: Medicare Other | Attending: Internal Medicine | Admitting: Internal Medicine

## 2023-03-11 VITALS — BP 128/78 | HR 71 | Ht 76.0 in | Wt 208.6 lb

## 2023-03-11 DIAGNOSIS — Z7984 Long term (current) use of oral hypoglycemic drugs: Secondary | ICD-10-CM

## 2023-03-11 DIAGNOSIS — I2510 Atherosclerotic heart disease of native coronary artery without angina pectoris: Secondary | ICD-10-CM | POA: Diagnosis not present

## 2023-03-11 DIAGNOSIS — I255 Ischemic cardiomyopathy: Secondary | ICD-10-CM | POA: Diagnosis not present

## 2023-03-11 DIAGNOSIS — E785 Hyperlipidemia, unspecified: Secondary | ICD-10-CM

## 2023-03-11 DIAGNOSIS — I10 Essential (primary) hypertension: Secondary | ICD-10-CM

## 2023-03-11 DIAGNOSIS — E1169 Type 2 diabetes mellitus with other specified complication: Secondary | ICD-10-CM | POA: Diagnosis not present

## 2023-03-11 NOTE — Patient Instructions (Signed)
Medication Instructions:  Your physician recommends that you continue on your current medications as directed. Please refer to the Current Medication list given to you today.  *If you need a refill on your cardiac medications before your next appointment, please call your pharmacy*   Lab Work: NONE ORDERED   If you have labs (blood work) drawn today and your tests are completely normal, you will receive your results only by: MyChart Message (if you have MyChart) OR A paper copy in the mail If you have any lab test that is abnormal or we need to change your treatment, we will call you to review the results.   Testing/Procedures: NONE ORDERED   Follow-Up: At [facility], you and your health needs are our priority.  As part of our continuing mission to provide you with exceptional heart care, we have created designated Provider Care Teams.  These Care Teams include your primary Cardiologist (physician) and Advanced Practice Providers (APPs -  Physician Assistants and Nurse Practitioners) who all work together to provide you with the care you need, when you need it.  We recommend signing up for the patient portal called "MyChart".  Sign up information is provided on this After Visit Summary.  MyChart is used to connect with patients for Virtual Visits (Telemedicine).  Patients are able to view lab/test results, encounter notes, upcoming appointments, etc.  Non-urgent messages can be sent to your provider as well.   To learn more about what you can do with MyChart, go to [email or web address].    Your next appointment:   6 month(s)  Provider:   You may see Yvonne Kendall, MD or one of the following Advanced Practice Providers on your designated Care Team:   Nicolasa Ducking, NP Eula Listen, PA-C Cadence Fransico Michael, PA-C Charlsie Quest, NP

## 2023-03-11 NOTE — Progress Notes (Signed)
Cardiology Office Note:  .   Date:  03/11/2023  ID:  Stuart Henry, DOB 23-Jun-1941, MRN [id number] PCP: Malva Limes, MD  [facility] Providers Cardiologist:  Yvonne Kall, MD     History of Present Illness: .   Stuart Henry is a 82 y.o. male with history of coronary artery disease status post CABG (09/2016) complicated by postoperative atrial fibrillation, ischemic cardiomyopathy (LVEF normal by echo in 04/2017), type 2 diabetes mellitus, and hypertension, who returns for follow-up of coronary artery disease, ischemic cardiomyopathy, and hypertension.  I last saw him in April, at which time he was feeling a little better but still noticed some fatigue towards the  of playing 18 holes of golf.  He noted improved glycemic control after switching from dapagliflozin to semaglutide.  Blood pressure was moderately elevated at our last visit, prompting [place] to increase lisinopril.  Today, [name] reports that he has been doing fairly well.  He has been caring for his wife following hip surgery about 3 months ago, which has significantly reduced his physical activity.  He notes that his blood sugars have tred up during this time.  He just started exercising again and played his first round of golf in several months yesterday.  He notes that he seemed less fatigued at the  of his round of golf and then a few months ago.  He denies chest pain, shortness of breath, palpitations, and lightheadedness.  He notes some muscle fatigue at times, which is stable.  He is tolerating his medications well.  ROS: See HPI  Studies Reviewed: .       Cath/PCI: LHC (09/16/16): Significant two-vessel CAD with 95% proximal LAD stenosis involving ostium of D1, 90% ostial ramus intermedius stenosis, and at least 50% lesions involving the ostium and mid section of the LCx. RCA with 25% distal stenosis. LVEF 55-60% with normal LVEDP.   Non-Invasive Evaluation(s): Exercise MPI (02/02/2020): Low risk  study without ischemia or scar.  LVEF 64%. Exercise MPI (04/07/2018): Interpreted as intermediate risk with possible apical anterior and apical ischemia and small inferolateral infarct.  Normal LVEF.  Greater than 1 mm ST depression at stress.  I personally reviewed the images and believe that the apical defect represents artifact, as there is normal perfusion in this region on the non-attenuation corrected images.  Only perfusion abnormality is a small apical inferior/lateral defect that may represent prior infarct, consistent with a low risk study. Transthoracic echocardiogram (04/23/17): Normal LV size. LVEF 55-60% with anteroseptal hypokinesis (? related to prior cardiac surgery). Normal diastolic function. Borderline dilated aortic root (3.7 cm). Mild MR. Normal RV size and function. Normal pulmonary artery pressure. Carotid Doppler (09/23/16): Mild (1-39%) stenoses involving the left and right internal carotid arteries. Vertebral arteries with antegrade flow. ABIs (09/23/16): Right 1.98 and the left 1.49 suggestive of medial calcification. Transthoracic echocardiogram (09/19/16): Normal LV size with mildly reduced contraction (EF 45-50%) with basal and mid anterior and anteroseptal hypokinesis. Grade 1 diastolic dysfunction. Mildly dilated left atrium. Normal RV size and function.  Risk Assessment/Calculations:             Physical Exam:   VS:  BP 128/78 (BP Location: Left Arm, Patient Position: Sitting, Cuff Size: Normal)   Pulse 71   Ht 6\' 4"  (1.93 m)   Wt 208 lb 9.6 oz (94.6 kg)   SpO2 98%   BMI 25.39 kg/m    Wt Readings from Last 3 Encounters:  03/11/23 208 lb 9.6 oz (94.6  kg)  11/27/22 214 lb 2 oz (97.1 kg)  11/24/22 215 lb (97.5 kg)    General:  NAD. Neck: No JVD or HJR. Lungs: Clear to auscultation bilaterally without wheezes or crackles. Heart: Regular rate and rhythm without murmurs, rubs, or gallops. Abdomen: Soft, nontender, nondistended. Extremities: No lower extremity  edema.  ASSESSMENT AND PLAN: .    Coronary artery disease: Mr. Specker continues to do well without recurrent angina.  Continue secondary prevention with aspirin and statin therapy.  Ischemic cardiomyopathy: Mr. Gable appears euvolemic with NYHA class II symptoms.  LVEF was normal by echo in 2018 and MPI in 2021.  Continue lisinopril 40 mg daily.  Defer beta-blocker in the setting of her degree AV block and low normal heart rates in the past.  Hyperlipidemia associated with type 2 diabetes mellitus: LDL well-controlled at 30 on last check in May.  Triglycerides were mildly elevated albeit with nonfasting sample.  Continue rosuvastatin 10 mg daily.  Ongoing management of DM per Dr. Sherrie Mustache.  Hypertension: Blood pressure significantly improved today over our last visit.  We will continue with lisinopril 40 mg daily.    Dispo: Return to clinic in 6 months.  Signed, Yvonne Kendall, MD

## 2023-03-16 ENCOUNTER — Other Ambulatory Visit: Payer: Self-pay | Admitting: Family Medicine

## 2023-03-16 DIAGNOSIS — E790 Hyperuricemia without signs of inflammatory arthritis and tophaceous disease: Secondary | ICD-10-CM

## 2023-04-27 ENCOUNTER — Ambulatory Visit: Payer: Medicare Other | Admitting: Family Medicine

## 2023-04-27 ENCOUNTER — Encounter: Payer: Self-pay | Admitting: Family Medicine

## 2023-04-27 VITALS — BP 146/75 | HR 66 | Ht 76.0 in | Wt 209.3 lb

## 2023-04-27 DIAGNOSIS — Z0000 Encounter for general adult medical examination without abnormal findings: Secondary | ICD-10-CM

## 2023-04-27 DIAGNOSIS — E785 Hyperlipidemia, unspecified: Secondary | ICD-10-CM

## 2023-04-27 DIAGNOSIS — I2510 Atherosclerotic heart disease of native coronary artery without angina pectoris: Secondary | ICD-10-CM

## 2023-04-27 DIAGNOSIS — Z125 Encounter for screening for malignant neoplasm of prostate: Secondary | ICD-10-CM

## 2023-04-27 DIAGNOSIS — Z8679 Personal history of other diseases of the circulatory system: Secondary | ICD-10-CM

## 2023-04-27 DIAGNOSIS — I10 Essential (primary) hypertension: Secondary | ICD-10-CM

## 2023-04-27 DIAGNOSIS — R809 Proteinuria, unspecified: Secondary | ICD-10-CM

## 2023-04-27 MED ORDER — NITROGLYCERIN 0.4 MG SL SUBL: SUBLINGUAL_TABLET | SUBLINGUAL | 0 refills | Status: DC

## 2023-04-27 NOTE — Progress Notes (Signed)
Annual Wellness Visit     Patient: Stuart Henry, Male    DOB: 1941/04/14, 82 y.o.   MRN: [id number] Visit Date: 04/27/2023  Today's Provider: Mila Merry, MD   Chief Complaint  Patient presents with   Annual Exam    No new concerns today    Diabetes   Hypertension   Hyperlipidemia   Subjective    Stuart Henry is a 82 y.o. male who presents today for his Annual Wellness Visit.   Medications: Outpatient Medications Prior to Visit  Medication Sig   ACCU-CHEK AVIVA PLUS test strip USE AS INSTRUCTED TO CHECK BLOOD SUGAR ONCE DAILY FOR TYPE 2  DIABETES   allopurinol (ZYLOPRIM) 100 MG tablet Take 1 tablet by mouth once daily   aspirin EC 81 MG tablet Take 1 tablet (81 mg total) by mouth daily.   Blood Glucose Monitoring Suppl (ACCU-CHEK AVIVA PLUS) w/Device KIT USE TO CHECK BLOOD SUGAR  DAILY AS DIRECTED   gabapentin (NEURONTIN) 300 MG capsule TAKE 1 CAPSULE BY MOUTH AT BEDTIME FOR  NEUROPATHY   glipiZIDE (GLUCOTROL XL) 10 MG 24 hr tablet Take 1 tablet by mouth once daily with breakfast   ketoconazole (NIZORAL) 2 % shampoo USE ON SCALP TWICE A WEEK   lisinopril (ZESTRIL) 40 MG tablet Take 1 tablet (40 mg total) by mouth daily.   metFORMIN (GLUCOPHAGE) 1000 MG tablet TAKE 1 TABLET BY MOUTH TWICE DAILY WITH MEALS   omeprazole (PRILOSEC) 40 MG capsule Take 1 capsule (40 mg total) by mouth in the morning and at bedtime. Take only as needed for heartburn and acid refux   OZEMPIC, 0.25 OR 0.5 MG/DOSE, 2 MG/3ML SOPN INJECT SUBCUTANEOUSLY 0.5 MG AS  DIRECTED ONCE WEEKLY   pioglitazone (ACTOS) 30 MG tablet Take 1 tablet by mouth once daily   rosuvastatin (CRESTOR) 10 MG tablet Take 1 tablet by mouth once daily   tamsulosin (FLOMAX) 0.4 MG CAPS capsule TAKE 1 CAPSULE BY MOUTH IN THE EVENING   [DISCONTINUED] nitroGLYCERIN (NITROSTAT) 0.4 MG SL tablet DISSOLVE ONE TABLET UNDER THE TONGUE EVERY 5 MINUTES AS NEEDED FOR CHEST PAIN.  DO NOT EXCEED A TOTAL OF 3 DOSES IN 15 MINUTES    No facility-administered medications prior to visit.    Allergies  Allergen Reactions   No Known Allergies     Patient Care Team: Malva Limes, MD as PCP - General (Family Medicine) End, Cristal Deer, MD as PCP - Cardiology (Cardiology) End, Cristal Deer, MD as Consulting Physician (Cardiology) Shon Millet, MD as Consulting Physician (Ophthalmology)      Objective      Most recent functional status assessment:    08/25/2022    8:28 AM  In your present state of health, do you have any difficulty performing the following activities:  Hearing? 0  Vision? 0  Difficulty concentrating or making decisions? 0  Walking or climbing stairs? 0  Dressing or bathing? 0  Doing errands, shopping? 0   Most recent fall risk assessment:    11/24/2022    9:39 AM  Fall Risk   Falls in the past year? 0  Number falls in past yr: 0  Injury with Fall? 0    Most recent depression screenings:    04/27/2023    8:45 AM 08/25/2022    8:27 AM  PHQ 2/9 Scores  PHQ - 2 Score 0 0  PHQ- 9 Score 0 0   Most recent cognitive screening:    11/14/2016   10:52 AM  6CIT Screen  What Year? 0 points  What month? 0 points  What time? 0 points  Count back from 20 0 points  Months in reverse 0 points  Repeat phrase 2 points  Total Score 2 points   Most recent Audit-C alcohol use screening    08/25/2022    8:28 AM  Alcohol Use Disorder Test (AUDIT)  1. How often do you have a drink containing alcohol? 4  2. How many drinks containing alcohol do you have on a typical day when you are drinking? 0  3. How often do you have six or more drinks on one occasion? 0  AUDIT-C Score 4   A score of 3 or more in women, and 4 or more in men indicates increased risk for alcohol abuse, EXCEPT if all of the points are from question 1   No results found for any visits on 04/27/23.  Assessment & Plan     Annual wellness visit done today including the all of the following: Reviewed patient's Family  Medical History Reviewed and updated list of patient's medical providers Assessment of cognitive impairment was done Assessed patient's functional ability Established a written schedule for health screening services Health Risk Assessent Completed and Reviewed  Exercise Activities and Dietary recommendations  Goals      Decrease soda consumption     Recommend decreasing soda intake from 32 oz to 16 oz a day.      DIET - INCREASE WATER INTAKE     Recommend increasing water intake to 2-4 glasses a day.          Immunization History  Administered Date(s) Administered   Fluad Quad(high Dose 65+) 04/10/2021, 04/21/2022   Influenza,inj,Quad PF,6+ Mos 05/02/2016   Influenza-Unspecified 05/08/2017, 04/13/2018, 05/04/2019   Moderna Sars-Covid-2 Vaccination 09/06/2019, 10/06/2019, 04/16/2020   Pneumococcal Conjugate-13 03/10/2014   Pneumococcal Polysaccharide-23 08/31/2006, 03/02/2012   Td 08/04/2002   Tdap 03/10/2014   Zoster Recombinant(Shingrix) 04/05/2018, 07/31/2018   Zoster, Live 06/16/2011    Health Maintenance  Topic Date Due   Diabetic kidney evaluation - Urine ACR  11/23/2022   INFLUENZA VACCINE  03/05/2023   COVID-19 Vaccine (4 - 2023-24 season) 04/05/2023   HEMOGLOBIN A1C  05/26/2023   OPHTHALMOLOGY EXAM  11/10/2023   Diabetic kidney evaluation - eGFR measurement  12/19/2023   DTaP/Tdap/Td (3 - Td or Tdap) 03/10/2024   Medicare Annual Wellness (AWV)  04/26/2024   Pneumonia Vaccine 50+ Years old  Completed   Zoster Vaccines- Shingrix  Completed   HPV VACCINES  Aged Out   Colonoscopy  Discontinued     Discussed health benefits of physical activity, and encouraged him to engage in regular exercise appropriate for his age and condition.          Mila Merry, MD  [facility] [phone] (phone) [phone] (fax)  [facility]

## 2023-04-27 NOTE — Progress Notes (Signed)
Complete physical exam   Patient: Stuart Henry   DOB: Jul 14, 1941   82 y.o. Male  MRN: [id number] Visit Date: 04/27/2023  Today's healthcare provider: Mila Merry, MD   Chief Complaint  Patient presents with   Annual Exam    No new concerns today    Diabetes   Hypertension   Hyperlipidemia   Subjective    Discussed the use of AI scribe software for clinical note transcription with the patient, who gave verbal consent to proceed.  History of Present Illness   The patient, with a history of diabetes, presents with poorly controlled blood sugars over the past five months. He attributes this to a lack of exercise and inconsistent diet due to caring for his wife after a hip replacement and his sister after a shoulder replacement. He reports that he has been unable to leave the house for exercise or maintain a consistent diet. He has noticed that eating after 6pm, regardless of the food, results in high blood sugars. He has been taking Ozempic 0.5, metformin, and glipizide for his diabetes. He reports occasional gas pain with Ozempic, but it is not severe or long-lasting. He has recently resumed playing golf and going to the gym.         Past Medical History:  Diagnosis Date   History of gout    "controlled w/daily RX:" (09/26/2016)   History of hiatal hernia    S/P OR   Past Surgical History:  Procedure Laterality Date   ANTERIOR CERVICAL DECOMP/DISCECTOMY FUSION     "titanium disc replacemen; ?C3t"   BACK SURGERY     CARDIAC CATHETERIZATION  1996   [place], No stent   CATARACT EXTRACTION W/ INTRAOCULAR LENS  IMPLANT, BILATERAL Bilateral    COLONOSCOPY WITH PROPOFOL N/A 02/09/2015   Procedure: COLONOSCOPY WITH PROPOFOL;  Surgeon: Scot Jun, MD;  Location: [facility] ENDOSCOPY;  Service: Endoscopy;  Laterality: N/A;   CORONARY ARTERY BYPASS GRAFT N/A 09/24/2016   Procedure: CORONARY ARTERY BYPASS GRAFTING (CABG) x four, using left internal mammary artery and right  leg greater saphenous vein harvested endoscopically;  Surgeon: Loreli Slot, MD;  Location: [facility] OR;  Service: Open Heart Surgery;  Laterality: N/A;   EXPLORATION POST OPERATIVE OPEN HEART     HIATAL HERNIA REPAIR  ~ 2003   "laparoscopic"   KNEE ARTHROSCOPY Left    LEFT HEART CATH AND CORONARY ANGIOGRAPHY Left 09/16/2016   Procedure: Left Heart Cath and Coronary Angiography;  Surgeon: Yvonne Kendall, MD;  Location: [facility] INVASIVE CV LAB;  Service: Cardiovascular;  Laterality: Left;   PATELLAR TENDON REPAIR Right    TEE WITHOUT CARDIOVERSION N/A 09/24/2016   Procedure: TRANSESOPHAGEAL ECHOCARDIOGRAM (TEE);  Surgeon: Loreli Slot, MD;  Location: [facility] OR;  Service: Open Heart Surgery;  Laterality: N/A;   TOOTH EXTRACTION     two teeth-07/04/2021   Social History   Socioeconomic History   Marital status: Married    Spouse name: Not on file   Number of children: 0   Years of education: Not on file   Highest education level: Bachelor's degree (e.g., BA, AB, BS)  Occupational History   Occupation: Retired  Tobacco Use   Smoking status: Former    Current packs/day: 0.00    Average packs/day: 1 pack/day for 5.0 years (5.0 ttl pk-yrs)    Types: Cigarettes    Start date: 08/07/1967    Quit date: 08/06/1972    Years since quitting: 50.7   Smokeless tobacco:  Never  Vaping Use   Vaping status: Never Used  Substance and Sexual Activity   Alcohol use: Yes    Alcohol/week: 2.0 - 3.0 standard drinks of alcohol    Types: 2 - 3 Glasses of wine per week    Comment: [place] in the evenings 4 days per week   Drug use: No   Sexual activity: Never  Other Topics Concern   Not on file  Social History Narrative   Not on file   Social Determinants of Health   Financial Resource Strain: Low Risk  (08/14/2017)   Overall Financial Resource Strain (CARDIA)    Difficulty of Paying Living Expenses: Not hard at all  Food Insecurity: No Food Insecurity (08/14/2017)   Hunger Vital Sign    Worried  About Running Out of Food in the Last Year: Never true    Ran Out of Food in the Last Year: Never true  Transportation Needs: No Transportation Needs (08/14/2017)   PRAPARE - Administrator, Civil Service (Medical): No    Lack of Transportation (Non-Medical): No  Physical Activity: Sufficiently Active (08/17/2017)   Exercise Vital Sign    Days of Exercise per Week: 5 days    Minutes of Exercise per Session: 60 min  Stress: No Stress Concern Present (08/14/2017)   [employer] of Occupational Health - Occupational Stress Questionnaire    Feeling of Stress : Not at all  Social Connections: Not on file  Intimate Partner Violence: Not At Risk (08/14/2017)   Humiliation, Afraid, Rape, and Kick questionnaire    Fear of Current or Ex-Partner: No    Emotionally Abused: No    Physically Abused: No    Sexually Abused: No   Family Status  Relation Name Status   Mother  Deceased   Father  Deceased       Cancer of the jaw and throat   Sister Eber Jones Alive   Sister  Alive   Sister  Alive   Sister Development worker, community Deceased   Brother  Alive   Other Gen Family hx (Not Specified)  No partnership data on file   Family History  Problem Relation Age of Onset   Dementia Mother    Diabetes Mother    Cancer Father    Diabetes Sister    Stroke Sister    Dementia Sister    Diabetes Brother    Heart disease Other    Allergies  Allergen Reactions   No Known Allergies     Patient Care Team: Malva Limes, MD as PCP - General (Family Medicine) End, Cristal Deer, MD as PCP - Cardiology (Cardiology) End, Cristal Deer, MD as Consulting Physician (Cardiology) Shon Millet, MD as Consulting Physician (Ophthalmology)   Medications: Outpatient Medications Prior to Visit  Medication Sig   ACCU-CHEK AVIVA PLUS test strip USE AS INSTRUCTED TO CHECK BLOOD SUGAR ONCE DAILY FOR TYPE 2  DIABETES   allopurinol (ZYLOPRIM) 100 MG tablet Take 1 tablet by mouth once daily   aspirin EC 81 MG tablet  Take 1 tablet (81 mg total) by mouth daily.   Blood Glucose Monitoring Suppl (ACCU-CHEK AVIVA PLUS) w/Device KIT USE TO CHECK BLOOD SUGAR  DAILY AS DIRECTED   gabapentin (NEURONTIN) 300 MG capsule TAKE 1 CAPSULE BY MOUTH AT BEDTIME FOR  NEUROPATHY   glipiZIDE (GLUCOTROL XL) 10 MG 24 hr tablet Take 1 tablet by mouth once daily with breakfast   ketoconazole (NIZORAL) 2 % shampoo USE ON SCALP TWICE A WEEK   lisinopril (  ZESTRIL) 40 MG tablet Take 1 tablet (40 mg total) by mouth daily.   metFORMIN (GLUCOPHAGE) 1000 MG tablet TAKE 1 TABLET BY MOUTH TWICE DAILY WITH MEALS   omeprazole (PRILOSEC) 40 MG capsule Take 1 capsule (40 mg total) by mouth in the morning and at bedtime. Take only as needed for heartburn and acid refux   OZEMPIC, 0.25 OR 0.5 MG/DOSE, 2 MG/3ML SOPN INJECT SUBCUTANEOUSLY 0.5 MG AS  DIRECTED ONCE WEEKLY   pioglitazone (ACTOS) 30 MG tablet Take 1 tablet by mouth once daily   rosuvastatin (CRESTOR) 10 MG tablet Take 1 tablet by mouth once daily   tamsulosin (FLOMAX) 0.4 MG CAPS capsule TAKE 1 CAPSULE BY MOUTH IN THE EVENING   [DISCONTINUED] nitroGLYCERIN (NITROSTAT) 0.4 MG SL tablet DISSOLVE ONE TABLET UNDER THE TONGUE EVERY 5 MINUTES AS NEEDED FOR CHEST PAIN.  DO NOT EXCEED A TOTAL OF 3 DOSES IN 15 MINUTES   No facility-administered medications prior to visit.    Review of Systems    Objective    BP (!) 146/75 (BP Location: Right Arm, Patient Position: Sitting, Cuff Size: Large)   Pulse 66   Ht 6\' 4"  (1.93 m)   Wt 209 lb 4.8 oz (94.9 kg)   SpO2 100%   BMI 25.48 kg/m    Physical Exam  General Appearance:    Well developed, well nourished male. Alert, cooperative, in no acute distress, appears stated age  Head:    Normocephalic, without obvious abnormality, atraumatic  Eyes:    PERRL, conjunctiva/corneas clear, EOM's intact, fundi    benign, both eyes       Ears:    Normal TM's and external ear canals, both ears  Nose:   Nares normal, septum midline, mucosa normal, no  drainage   or sinus tenderness  Throat:   Lips, mucosa, and tongue normal; teeth and gums normal  Neck:   Supple, symmetrical, trachea midline, no adenopathy;       thyroid:  No enlargement/tenderness/nodules; no carotid   bruit or JVD  Back:     Symmetric, no curvature, ROM normal, no CVA tenderness  Lungs:     Clear to auscultation bilaterally, respirations unlabored  Chest wall:    No tenderness or deformity  Heart:    Normal heart rate. Normal rhythm. No murmurs, rubs, or gallops.  S1 and S2 normal  Abdomen:     Soft, non-tender, bowel sounds active all four quadrants,    no masses, no organomegaly  Genitalia:    deferred  Rectal:    deferred  Extremities:   All extremities are intact. No cyanosis or edema  Pulses:   2+ and symmetric all extremities  Skin:   Skin color, texture, turgor normal, no rashes or lesions  Lymph nodes:   Cervical, supraclavicular, and axillary nodes normal  Neurologic:   CNII-XII intact. Normal strength, sensation and reflexes      throughout      Assessment & Plan    Routine Complete Physical Exam    Type 2 Diabetes Mellitus Poor glycemic control due to lifestyle changes related to caring for family members post-surgery. Currently on Ozempic 0.5mg  weekly, Metformin, and Glipizide. Unclear if still taking pioglitazone. No adverse effects from Ozempic reported. -Anticipate to increase Ozempic to 1mg  weekly after reviewing A1c and other blood work. -Consider reducing Glipizide dose after increasing Ozempic. -Check A1c and other blood work.  Hypertension Patient reports blood pressure readings have been in the 120s-130s/80s at home. -Continue current antihypertensive regimen. -Check  blood pressure during next visit.  General Health Maintenance -All vaccinations up to date. -Refill Nitroglycerin prescription at [facility]. -Continue regular exercise as able (golf, gym).             Mila Merry, MD   [facility] [phone] (phone) [phone] (fax)  [facility]

## 2023-04-28 LAB — RENAL FUNCTION PANEL
Albumin: 4.4 g/dL (ref 3.8–4.8)
BUN/Creatinine Ratio: 13 (ref 10–24)
BUN: 14 mg/dL (ref 8–27)
CO2: 22 mmol/L (ref 20–29)
Calcium: 8.9 mg/dL (ref 8.6–10.2)
Chloride: 101 mmol/L (ref 96–106)
Creatinine, Ser: 1.08 mg/dL (ref 0.76–1.27)
Glucose: 181 mg/dL — ABNORMAL HIGH (ref 70–99)
Phosphorus: 3.4 mg/dL (ref 2.8–4.1)
Potassium: 4.6 mmol/L (ref 3.5–5.2)
Sodium: 140 mmol/L (ref 134–144)
eGFR: 69 mL/min/{1.73_m2} (ref >59)

## 2023-04-28 LAB — HEMOGLOBIN A1C
Est. average glucose Bld gHb Est-mCnc: 209 mg/dL
Hgb A1c MFr Bld: 8.9 % — ABNORMAL HIGH (ref 4.8–5.6)

## 2023-04-28 LAB — MICROALBUMIN / CREATININE URINE RATIO
Creatinine, Urine: 86.9 mg/dL
Microalb/Creat Ratio: 30 mg/g creat — ABNORMAL HIGH (ref 0–29)
Microalbumin, Urine: 25.9 ug/mL

## 2023-04-28 LAB — PSA: Prostate Specific Ag, Serum: 0.5 ng/mL (ref 0.0–4.0)

## 2023-05-04 ENCOUNTER — Other Ambulatory Visit: Payer: Self-pay | Admitting: Internal Medicine

## 2023-05-26 ENCOUNTER — Other Ambulatory Visit: Payer: Self-pay | Admitting: Family Medicine

## 2023-05-26 DIAGNOSIS — N401 Enlarged prostate with lower urinary tract symptoms: Secondary | ICD-10-CM

## 2023-06-01 ENCOUNTER — Other Ambulatory Visit: Payer: Self-pay | Admitting: Family Medicine

## 2023-06-01 DIAGNOSIS — E1129 Type 2 diabetes mellitus with other diabetic kidney complication: Secondary | ICD-10-CM

## 2023-06-01 MED ORDER — SEMAGLUTIDE (1 MG/DOSE) 4 MG/3ML ~~LOC~~ SOPN: 1.0000 mg | PEN_INJECTOR | SUBCUTANEOUS | 1 refills | Status: DC

## 2023-06-16 ENCOUNTER — Other Ambulatory Visit: Payer: Self-pay | Admitting: Family Medicine

## 2023-06-16 DIAGNOSIS — E790 Hyperuricemia without signs of inflammatory arthritis and tophaceous disease: Secondary | ICD-10-CM

## 2023-07-19 ENCOUNTER — Other Ambulatory Visit: Payer: Self-pay | Admitting: Family Medicine

## 2023-07-19 DIAGNOSIS — E1140 Type 2 diabetes mellitus with diabetic neuropathy, unspecified: Secondary | ICD-10-CM

## 2023-08-04 ENCOUNTER — Other Ambulatory Visit: Payer: Self-pay | Admitting: Family Medicine

## 2023-08-04 ENCOUNTER — Other Ambulatory Visit: Payer: Self-pay | Admitting: Internal Medicine

## 2023-08-04 DIAGNOSIS — G629 Polyneuropathy, unspecified: Secondary | ICD-10-CM

## 2023-08-18 ENCOUNTER — Other Ambulatory Visit: Payer: Self-pay | Admitting: Family Medicine

## 2023-08-18 DIAGNOSIS — L219 Seborrheic dermatitis, unspecified: Secondary | ICD-10-CM

## 2023-08-31 ENCOUNTER — Ambulatory Visit: Payer: Medicare Other | Admitting: Family Medicine

## 2023-08-31 VITALS — BP 171/94 | HR 65 | Temp 97.5°F | Ht 76.0 in | Wt 204.0 lb

## 2023-08-31 DIAGNOSIS — Z7985 Long-term (current) use of injectable non-insulin antidiabetic drugs: Secondary | ICD-10-CM | POA: Diagnosis not present

## 2023-08-31 DIAGNOSIS — N401 Enlarged prostate with lower urinary tract symptoms: Secondary | ICD-10-CM | POA: Diagnosis not present

## 2023-08-31 DIAGNOSIS — E1129 Type 2 diabetes mellitus with other diabetic kidney complication: Secondary | ICD-10-CM

## 2023-08-31 DIAGNOSIS — R809 Proteinuria, unspecified: Secondary | ICD-10-CM

## 2023-08-31 LAB — POCT GLYCOSYLATED HEMOGLOBIN (HGB A1C): Hemoglobin A1C: 9 % — AB (ref 4.0–5.6)

## 2023-08-31 MED ORDER — GLIPIZIDE ER 10 MG PO TB24: 10.0000 mg | ORAL_TABLET | Freq: Every day | ORAL | 4 refills | Status: DC

## 2023-08-31 MED ORDER — DAPAGLIFLOZIN PROPANEDIOL 10 MG PO TABS: 10.0000 mg | ORAL_TABLET | Freq: Every day | ORAL | 2 refills | Status: DC

## 2023-08-31 MED ORDER — NITROGLYCERIN 0.4 MG SL SUBL: SUBLINGUAL_TABLET | SUBLINGUAL | 0 refills | Status: DC

## 2023-08-31 MED ORDER — TAMSULOSIN HCL 0.4 MG PO CAPS: 0.4000 mg | ORAL_CAPSULE | Freq: Every day | ORAL | 4 refills | Status: AC

## 2023-08-31 NOTE — Patient Instructions (Signed)
Marland Kitchen  Please review the attached list of medications and notify my office if there are any errors.   . Please bring all of your medications to every appointment so we can make sure that our medication list is the same as yours.

## 2023-08-31 NOTE — Progress Notes (Signed)
Established patient visit   Patient: Stuart Henry   DOB: April 18, 1941   83 y.o. Male  MRN: [id number] Visit Date: 08/31/2023  Today's healthcare provider: Mila Merry, MD   Chief Complaint  Patient presents with   Diabetes    Patient reports his glucose levels have been high, running about 160-210.  He states he is taking the Ozempic and it does not seem to be helping.  He states he is having to pay about $700 every 3 months.  He states he can't afford to keep taking it at that price, especially since it does not seem to help.   Subjective    Diabetes Pertinent negatives for diabetes include no chest pain.   Follow up DM2. Changed from [place] to Ozempic last year due to cost, but now Ozempic is cost prohibitive. Is taking consistently with no perceived side effects, but sugar consistently in the 200s.   Lab Results  Component Value Date   HGBA1C 9.0 (A) 08/31/2023   HGBA1C 8.9 (H) 04/27/2023   HGBA1C 7.7 (A) 11/24/2022     Medications: Outpatient Medications Prior to Visit  Medication Sig Note   ACCU-CHEK AVIVA PLUS test strip USE AS INSTRUCTED TO CHECK BLOOD SUGAR ONCE DAILY FOR TYPE 2  DIABETES    allopurinol (ZYLOPRIM) 100 MG tablet Take 1 tablet by mouth once daily    aspirin EC 81 MG tablet Take 1 tablet (81 mg total) by mouth daily.    Blood Glucose Monitoring Suppl (ACCU-CHEK AVIVA PLUS) w/Device KIT USE TO CHECK BLOOD SUGAR  DAILY AS DIRECTED    gabapentin (NEURONTIN) 300 MG capsule TAKE 1 CAPSULE BY MOUTH AT BEDTIME FOR  NEUROPATHY    ketoconazole (NIZORAL) 2 % shampoo SHAMPOO   TOPICALLY TWICE A WEEK    lisinopril (ZESTRIL) 40 MG tablet Take 1 tablet (40 mg total) by mouth daily.    metFORMIN (GLUCOPHAGE) 1000 MG tablet TAKE 1 TABLET BY MOUTH TWICE DAILY WITH MEALS    omeprazole (PRILOSEC) 40 MG capsule Take 1 capsule (40 mg total) by mouth in the morning and at bedtime. Take only as needed for heartburn and acid refux    pioglitazone (ACTOS) 30 MG tablet  Take 1 tablet by mouth once daily    rosuvastatin (CRESTOR) 10 MG tablet Take 1 tablet by mouth once daily    [DISCONTINUED] glipiZIDE (GLUCOTROL XL) 10 MG 24 hr tablet Take 1 tablet by mouth once daily with breakfast    [DISCONTINUED] nitroGLYCERIN (NITROSTAT) 0.4 MG SL tablet DISSOLVE ONE TABLET UNDER THE TONGUE EVERY 5 MINUTES AS NEEDED FOR CHEST PAIN.  DO NOT EXCEED A TOTAL OF 3 DOSES IN 15 MINUTES    [DISCONTINUED] Semaglutide, 1 MG/DOSE, 4 MG/3ML SOPN Inject 1 mg as directed once a week. 08/31/2023: TOO EXPENSIVE AND NOT AS EFFECTIVE AS FARXIGA WAS   [DISCONTINUED] tamsulosin (FLOMAX) 0.4 MG CAPS capsule TAKE 1 CAPSULE BY MOUTH IN THE EVENING    No facility-administered medications prior to visit.    Review of Systems  Constitutional:  Negative for appetite change, chills and fever.  Respiratory:  Negative for chest tightness, shortness of breath and wheezing.   Cardiovascular:  Negative for chest pain and palpitations.  Gastrointestinal:  Negative for abdominal pain, nausea and vomiting.       Objective    BP (!) 171/94   Pulse 65   Temp (!) 97.5 F (36.4 C) (Oral)   Ht 6\' 4"  (1.93 m)   Wt 204 lb (  92.5 kg)   SpO2 100%   BMI 24.83 kg/m    Physical Exam  Awake, alert, oriented x 3. In no apparent distress   Results for orders placed or performed in visit on 08/31/23  POCT glycosylated hemoglobin (Hb A1C)  Result Value Ref Range   Hemoglobin A1C 9.0 (A) 4.0 - 5.6 %    Assessment & Plan     1. 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   Change Ozempic back to dapagliflozin propanediol (FARXIGA) 10 MG TABS tablet; Take 1 tablet (10 mg total) by mouth daily before breakfast.  Dispense: 90 tablet; Refill: 2   refill glipiZIDE (GLUCOTROL XL) 10 MG 24 hr tablet; Take 1 tablet (10 mg total) by mouth daily with breakfast.  Dispense: 90 tablet;  Refill: 4  Follow up to check A1c and routine labs including kidney functions and electrolytes in 3 months.    2. Benign prostatic hyperplasia with lower urinary tract symptoms, symptom details unspecified refill tamsulosin (FLOMAX) 0.4 MG CAPS capsule; Take 1 capsule (0.4 mg total) by mouth daily after supper.  Dispense: 90 capsule; Refill: 4   Return in about 3 months (around 11/29/2023) for Diabetes.         Mila Merry, MD  [facility] [phone] (phone) [phone] (fax)  [facility]

## 2023-09-14 ENCOUNTER — Other Ambulatory Visit: Payer: Self-pay | Admitting: Family Medicine

## 2023-09-14 DIAGNOSIS — E790 Hyperuricemia without signs of inflammatory arthritis and tophaceous disease: Secondary | ICD-10-CM

## 2023-11-11 LAB — HM DIABETES EYE EXAM

## 2023-11-16 NOTE — Progress Notes (Signed)
 [facility] Quality Team Note  Name: Stuart Henry Date of Birth: 04/19/1941 MRN: [id number] Date: 11/16/2023  [facility] Quality Team has reviewed this patient's chart, please see recommendations below:  [facility] Quality Other; (Patient due for KED labs. Patient needs URINE Microalbumin/Creatinine Ratio as well as eGFR completed for gap closure. Please address at upcoming appt with PCP 11/30/2023.)

## 2023-11-27 ENCOUNTER — Other Ambulatory Visit: Payer: Self-pay | Admitting: Internal Medicine

## 2023-11-30 ENCOUNTER — Ambulatory Visit: Payer: Medicare Other | Admitting: Family Medicine

## 2023-11-30 ENCOUNTER — Encounter: Payer: Self-pay | Admitting: Family Medicine

## 2023-11-30 VITALS — BP 162/73 | HR 52 | Temp 97.9°F | Resp 16 | Ht 75.0 in | Wt 204.9 lb

## 2023-11-30 DIAGNOSIS — G8929 Other chronic pain: Secondary | ICD-10-CM | POA: Diagnosis not present

## 2023-11-30 DIAGNOSIS — R809 Proteinuria, unspecified: Secondary | ICD-10-CM | POA: Diagnosis not present

## 2023-11-30 DIAGNOSIS — M25512 Pain in left shoulder: Secondary | ICD-10-CM | POA: Diagnosis not present

## 2023-11-30 DIAGNOSIS — E1129 Type 2 diabetes mellitus with other diabetic kidney complication: Secondary | ICD-10-CM

## 2023-11-30 DIAGNOSIS — Z7984 Long term (current) use of oral hypoglycemic drugs: Secondary | ICD-10-CM

## 2023-11-30 LAB — POCT GLYCOSYLATED HEMOGLOBIN (HGB A1C)
Est. average glucose Bld gHb Est-mCnc: 237
Hemoglobin A1C: 9.9 % — AB (ref 4.0–5.6)

## 2023-11-30 MED ORDER — DAPAGLIFLOZIN PROPANEDIOL 10 MG PO TABS
10.0000 mg | ORAL_TABLET | Freq: Every day | ORAL | 1 refills | Status: DC
Start: 2023-11-30 — End: 2024-05-13

## 2023-11-30 MED ORDER — FLUCONAZOLE 150 MG PO TABS: 150.0000 mg | ORAL_TABLET | Freq: Once | ORAL | 3 refills | Status: AC

## 2023-11-30 NOTE — Patient Instructions (Signed)
 Stuart Henry  Please review the attached list of medications and notify my office if there are any errors.   . Please bring all of your medications to every appointment so we can make sure that our medication list is the same as yours.

## 2023-12-05 NOTE — Progress Notes (Signed)
 Established patient visit   Patient: Stuart Henry   DOB: 07-09-1941   83 y.o. Male  MRN: [id number] Visit Date: 11/30/2023  Today's healthcare provider: Jeralene Mom, MD   Chief Complaint  Patient presents with   Medical Management of Chronic Issues   Diabetes   Subjective    Discussed the use of AI scribe software for clinical note transcription with the patient, who gave verbal consent to proceed.  History of Present Illness   Stuart Henry is an 83 year old male with diabetes who presents with elevated blood sugar levels.  His A1c and blood sugar levels have been consistently high throughout the month, averaging around 200 mg/dL. He acknowledges dietary indiscretions but denies excessive food intake. The elevated blood sugar levels have led to a recurrent genital yeast infection. Since he last visit he has changed back to  Farxiga , which he finds more affordable than Ozempic  at about $60 per month.   He experiences back pain and a sore rotator cuff, with continuous muscle spasms in his back and significant soreness in his shoulder, particularly with certain movements. He has not consulted an orthopedist for these issues, although one is available nearby.  He recently visited his eye doctor, who found no damage to his eyes despite his A1c being 9.0 for a prolonged period. His A1c was noted to be 9.9 today.  He attributes part of his elevated blood sugar levels to decreased physical activity. He used to play golf regularly but has not been as active due to cold weather and lack of motivation. He also mentions going to the gym less frequently and feeling bored and tired when using the elliptical. He has not ridden his bicycle in a long time due to fear of overexertion on hills.  No chest pains, but he expresses concern about the physical exertion required for certain activities.     Lab Results  Component Value Date   HGBA1C 9.9 (A) 11/30/2023   HGBA1C 9.0 (A)  08/31/2023   HGBA1C 8.9 (H) 04/27/2023     Medications: Outpatient Medications Prior to Visit  Medication Sig   ACCU-CHEK AVIVA PLUS test strip USE AS INSTRUCTED TO CHECK BLOOD SUGAR ONCE DAILY FOR TYPE 2  DIABETES   allopurinol  (ZYLOPRIM ) 100 MG tablet Take 1 tablet by mouth once daily   aspirin  EC 81 MG tablet Take 1 tablet (81 mg total) by mouth daily.   Blood Glucose Monitoring Suppl (ACCU-CHEK AVIVA PLUS) w/Device KIT USE TO CHECK BLOOD SUGAR  DAILY AS DIRECTED   gabapentin  (NEURONTIN ) 300 MG capsule TAKE 1 CAPSULE BY MOUTH AT BEDTIME FOR  NEUROPATHY   glipiZIDE  (GLUCOTROL  XL) 10 MG 24 hr tablet Take 1 tablet (10 mg total) by mouth daily with breakfast.   ketoconazole  (NIZORAL ) 2 % shampoo SHAMPOO   TOPICALLY TWICE A WEEK   lisinopril  (ZESTRIL ) 40 MG tablet Take 1 tablet by mouth once daily   metFORMIN  (GLUCOPHAGE ) 1000 MG tablet TAKE 1 TABLET BY MOUTH TWICE DAILY WITH MEALS   nitroGLYCERIN  (NITROSTAT ) 0.4 MG SL tablet DISSOLVE ONE TABLET UNDER THE TONGUE EVERY 5 MINUTES AS NEEDED FOR CHEST PAIN.  DO NOT EXCEED A TOTAL OF 3 DOSES IN 15 MINUTES   omeprazole  (PRILOSEC) 40 MG capsule Take 1 capsule (40 mg total) by mouth in the morning and at bedtime. Take only as needed for heartburn and acid refux   pioglitazone  (ACTOS ) 30 MG tablet Take 1 tablet by mouth once daily  rosuvastatin  (CRESTOR ) 10 MG tablet Take 1 tablet by mouth once daily   tamsulosin  (FLOMAX ) 0.4 MG CAPS capsule Take 1 capsule (0.4 mg total) by mouth daily after supper.   dapagliflozin  propanediol (FARXIGA ) 10 MG TABS tablet Take 1 tablet (10 mg total) by mouth daily before breakfast.   No facility-administered medications prior to visit.   Review of Systems     Objective    BP (!) 162/73 (BP Location: Left Arm, Patient Position: Sitting, Cuff Size: Normal)   Pulse (!) 52   Temp 97.9 F (36.6 C) (Oral)   Resp 16   Ht 6\' 3"  (1.905 m)   Wt 204 lb 14.4 oz (92.9 kg)   BMI 25.61 kg/m   Physical Exam   General  appearance: Well developed, well nourished male, cooperative and in no acute distress Head: Normocephalic, without obvious abnormality, atraumatic Respiratory: Respirations even and unlabored, normal respiratory rate Extremities: All extremities are intact.  Skin: Skin color, texture, turgor normal. No rashes seen  Psych: Appropriate mood and affect. Neurologic: Mental status: Alert, oriented to person, place, and time, thought content appropriate.     Results for orders placed or performed in visit on 11/30/23  POCT glycosylated hemoglobin (Hb A1C)  Result Value Ref Range   Hemoglobin A1C 9.9 (A) 4.0 - 5.6 %   Est. average glucose Bld gHb Est-mCnc 237     Assessment & Plan        Type 2 diabetes mellitus with hyperglycemia Chronic type 2 diabetes with poor glycemic control. A1c 9.9 indicates worsening hyperglycemia. Was better controlled with Ozempic  which he tolerated fairly well, but was cost prohibitive.  - Consult pharmacist for cost-effective diabetes medication options. - Refill Farxiga  via [facility]. - Encourage increased physical activity.  Genital candidiasis He reports sx of genital yeast infection which he had in the past when taking Farxiga .  Previous oral antifungal effective. - Prescribe Diflucan  and send to local pharmacy.  Pain in left shoulder Chronic left shoulder pain likely from rotator cuff injury. No recent orthopedic evaluation. - Refer to orthopedist for evaluation.  Muscle spasm of back Chronic back muscle spasms reducing physical activity.  Follow-Up Plan to check back over the summer. Pharmacist will contact him to discuss medication options. - Schedule follow-up appointment toward end of summer.         Jeralene Mom, MD  [facility] [phone] (phone) [phone] (fax)  [facility]

## 2023-12-08 ENCOUNTER — Telehealth: Payer: Self-pay

## 2023-12-08 NOTE — Progress Notes (Signed)
 Care Guide Pharmacy Note  12/08/2023 Name: Stuart Henry MRN: [id number] DOB: [birth year]  Referred By: Lamon Pillow, MD Reason for referral: Complex Care Management (Outreach to schedule with Pharm d )   Stuart Henry is a 83 y.o. year old male who is a primary care patient of Shann Darnel, Erlinda Haws, MD.  [name] was referred to the pharmacist for assistance related to: DMII  An unsuccessful telephone outreach was attempted today to contact the patient who was referred to the pharmacy team for assistance with medication assistance. Additional attempts will be made to contact the patient.  Lenton Rail , RMA     [facility]  [facility], [facility] Guide  Direct Dial: [phone]  Website: [email or web address]

## 2023-12-11 NOTE — Progress Notes (Signed)
 Care Guide Pharmacy Note  12/11/2023 Name: Stuart Henry MRN: [id number] DOB: 06-27-1941  Referred By: Lamon Pillow, MD Reason for referral: Complex Care Management (Outreach to schedule with Pharm d )   Stuart Henry is a 83 y.o. year old male who is a primary care patient of Lamon Pillow, MD.  [name] was referred to the pharmacist for assistance related to: DMII  Successful contact was made with the patient to discuss pharmacy services including being ready for the pharmacist to call at least 5 minutes before the scheduled appointment time and to have medication bottles and any blood pressure readings ready for review. The patient agreed to meet with the pharmacist via telephone visit on (date/time).12/18/2023  Lenton Rail , RMA     [place]  [facility], [facility] Guide  Direct Dial: [phone]  Website: [email or web address]

## 2023-12-14 ENCOUNTER — Other Ambulatory Visit: Payer: Self-pay | Admitting: Family Medicine

## 2023-12-14 DIAGNOSIS — E790 Hyperuricemia without signs of inflammatory arthritis and tophaceous disease: Secondary | ICD-10-CM

## 2023-12-18 ENCOUNTER — Other Ambulatory Visit: Payer: Self-pay | Admitting: Pharmacist

## 2023-12-18 DIAGNOSIS — R809 Proteinuria, unspecified: Secondary | ICD-10-CM

## 2023-12-18 NOTE — Progress Notes (Addendum)
   12/18/2023  Patient ID: [name], male   DOB: September 19, 1940, 83 y.o.   MRN: [id number]  Called and spoke with the patient on the phone today regarding referral for cost of Ozempic .   Patient reports not qualifying for Ozempic  PAP based on combined income. Advised the only other option at this point is insulin . Patient is willing to start and able to pay $35 monthly.  Would like to use [facility] for insulin  prescription. Will be on vacation from 6/8 to 6/11.   For BP, advised it was high at the last 2 visits and appears to be going up since September 2024. Has NOT been checking it at home. Advised to considered keeping a closer eye on it. 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 Wondering if this is what's causing it to go up.   Plan:  - Ask Dr. Shann Darnel about starting Lantus 10U each morning - Will call to follow-up on outcome of insulin  start  Update from 12/24/23:  - Insulin  script and needles were [facility] this morning by Dr. Shann Darnel - Unable to reach patient- left voicemail advising to follow instructions for use as it arrives and I will call in 1 week to see how things are going   Delvin File, PharmD [facility]  Phone Number: [phone]

## 2023-12-24 MED ORDER — LANTUS SOLOSTAR 100 UNIT/ML ~~LOC~~ SOPN
PEN_INJECTOR | SUBCUTANEOUS | 5 refills | Status: DC
Start: 2023-12-24 — End: 2024-06-15

## 2023-12-24 MED ORDER — PEN NEEDLES 32G X 4 MM MISC: 3 refills | Status: AC

## 2023-12-24 NOTE — Addendum Note (Signed)
 Addended by: Niko Penson M on: 12/24/2023 02:44 PM   Modules accepted: Orders

## 2024-01-01 ENCOUNTER — Other Ambulatory Visit: Payer: Self-pay | Admitting: Pharmacist

## 2024-01-01 NOTE — Progress Notes (Addendum)
   01/01/2024 Name: Stuart Henry MRN: [id number] DOB: 30-Jul-1941  Chief Complaint  Patient presents with   Diabetes    Stuart Henry is a 83 y.o. year old male who presented for a telephone visit.   They were referred to the pharmacist by their PCP for assistance in managing diabetes.   Referred for PAP assistance with Ozempic - patient does not qualify  Subjective:  Care Team: Primary Care Provider: Lamon Pillow, MD ; Next Scheduled Visit: 04/01/24 Clinical Pharmacist: Delvin File, PharmD  Medication Access/Adherence  Current Pharmacy:  [facility] [address], [place] - [address] [place] [address] Phone: [phone] Fax: [phone]  [facility]) - [place], [place] - [address] [facility] [address] [place] [address] [place] [address] Phone: [phone] Fax: [phone]  [facility] Delivery - [place], [place] - [address] [place] [address] Phone: [phone] Fax: [phone]   Patient reports affordability concerns with their medications: No  Patient reports access/transportation concerns to their pharmacy: No  Patient reports adherence concerns with their medications:  No     Diabetes:  Current medications: Farxiga  10mg  daily, Glipizide  XL 10mg  daily, Metformin  1000mg  twice a day, Pioglitazone  30mg  daily Medications tried in the past: Ozempic  (too expensive)  Current glucose readings from 5/30 visit: 166 today (no insulin ); reports usually at 180's Using Accu-Chek Aviva Plus meter; testing 1 times daily   Patient denies hypoglycemic s/sx including dizziness, shakiness, sweating. Patient denies hyperglycemic symptoms including polyuria, polydipsia, polyphagia, nocturia, neuropathy, blurred vision.  Current physical activity: Bikes 10 miles  Current medication access support: [facility] Medicare Advantage   Objective:  Lab Results   Component Value Date   HGBA1C 9.9 (A) 11/30/2023    Lab Results  Component Value Date   CREATININE 1.08 04/27/2023   BUN 14 04/27/2023   NA 140 04/27/2023   K 4.6 04/27/2023   CL 101 04/27/2023   CO2 22 04/27/2023    Lab Results  Component Value Date   CHOL 96 12/19/2022   HDL 35 (L) 12/19/2022   LDLCALC 30 12/19/2022   TRIG 156 (H) 12/19/2022   CHOLHDL 2.7 12/19/2022    Medications Reviewed Today   Medications were not reviewed in this encounter       Assessment/Plan:   Diabetes: - Currently uncontrolled - Reviewed long term cardiovascular and renal outcomes of uncontrolled blood sugar - Reviewed goal A1c, goal fasting, and goal 2 hour post prandial glucose - Recommend to check glucose daily   Follow Up Plan:  - Follow-up call on 01/05/24 at 9:30AM for insulin  titration - START Lantus  10U daily in the morning today- do NOT increase until our next call - Will continue Glipizide  XL for now - Concerns that eating worse towards end of the year and not riding his bike as often has caused it to go up- more tired lately   Delvin File, PharmD [place]  Phone Number: [phone]

## 2024-01-05 ENCOUNTER — Other Ambulatory Visit: Payer: Self-pay | Admitting: Pharmacist

## 2024-01-05 NOTE — Progress Notes (Signed)
   01/05/2024 Name: Stuart Henry MRN: [id number] DOB: 03-May-1941  Chief Complaint  Patient presents with   Diabetes    Stuart Henry is a 83 y.o. year old male who presented for a telephone visit.   They were referred to the pharmacist by their PCP for assistance in managing diabetes.   Referred for PAP assistance with Ozempic - patient does not qualify  Subjective:  Care Team: Primary Care Provider: Lamon Pillow, MD ; Next Scheduled Visit: 04/01/24 Clinical Pharmacist: Delvin File, PharmD  Medication Access/Adherence  Current Pharmacy:  [facility] [address], [place] - [address] [place] [address] Phone: [phone] Fax: [phone]  [facility] Delivery) - [place], [place] - [address] [facility] [address] [place] [address] [place] [address] Phone: [phone] Fax: [phone]  [facility] Delivery - [place], [place] - [address] [place] [address] Phone: [phone] Fax: [phone]   Patient reports affordability concerns with their medications: No  Patient reports access/transportation concerns to their pharmacy: No  Patient reports adherence concerns with their medications:  No     Diabetes:  Current medications: Farxiga  10mg  daily, Glipizide  XL 10mg  daily, Metformin  1000mg  twice a day, Pioglitazone  30mg  daily, Lantus  15U daily in the morning Medications tried in the past: Ozempic  (too expensive)  Current glucose readings from 6/3 visit: 196 (31st ate wrong), 181, 158, 156 Current glucose readings from 5/30 visit: 166 today (no insulin ); reports usually at 180's Using Accu-Chek Aviva Plus meter; testing 1 times daily   Patient denies hypoglycemic s/sx including dizziness, shakiness, sweating. Patient denies hyperglycemic symptoms including polyuria, polydipsia, polyphagia, nocturia, neuropathy, blurred vision.  Current physical activity:  Bikes 10 miles  Current medication access support: [facility] Medicare Advantage   Objective:  Lab Results  Component Value Date   HGBA1C 9.9 (A) 11/30/2023    Lab Results  Component Value Date   CREATININE 1.08 04/27/2023   BUN 14 04/27/2023   NA 140 04/27/2023   K 4.6 04/27/2023   CL 101 04/27/2023   CO2 22 04/27/2023    Lab Results  Component Value Date   CHOL 96 12/19/2022   HDL 35 (L) 12/19/2022   LDLCALC 30 12/19/2022   TRIG 156 (H) 12/19/2022   CHOLHDL 2.7 12/19/2022    Medications Reviewed Today   Medications were not reviewed in this encounter       Assessment/Plan:   Diabetes: - Currently uncontrolled - Reviewed long term cardiovascular and renal outcomes of uncontrolled blood sugar - Reviewed goal A1c, goal fasting, and goal 2 hour post prandial glucose - Recommend to check glucose daily   Follow Up Plan:  - Follow-up call on 01/08/24 at 1PM for insulin  titration - INCREASE Lantus  15U daily in the morning today- do NOT increase until our next call - Will continue Glipizide  XL for now - Concerns that eating worse towards end of the year and not riding his bike as often has caused it to go up- more tired lately  *Patient will be out of town from 6/8 to 6/11 for birthday trip (wait  until 6/13 to call again)   Delvin File, PharmD [facility]  Phone Number: [phone]

## 2024-01-08 ENCOUNTER — Other Ambulatory Visit: Payer: Self-pay | Admitting: Pharmacist

## 2024-01-08 NOTE — Progress Notes (Signed)
 01/08/2024 Name: Stuart Henry MRN: [id number] DOB: [birth year]  Chief Complaint  Patient presents with   Diabetes    Stuart Henry is a 83 y.o. year old male who presented for a telephone visit.   They were referred to the pharmacist by their PCP for assistance in managing diabetes.   Referred for PAP assistance with Ozempic - patient does not qualify  Subjective:  Care Team: Primary Care Provider: Lamon Pillow, MD ; Next Scheduled Visit: 04/01/24 Clinical Pharmacist: Delvin File, PharmD  Medication Access/Adherence  Current Pharmacy:  [facility] [address] [place] - [address] [place] [address] Phone: [phone] Fax: [phone]  [facility] Delivery) - [place], [place] - [address] [facility] [address] [place] [address] [place] [address] Phone: [phone] Fax: [phone]  [facility] Delivery - [place], [place] - [address] [place] [address] Phone: [phone] Fax: [phone]   Patient reports affordability concerns with their medications: No  Patient reports access/transportation concerns to their pharmacy: No  Patient reports adherence concerns with their medications:  No     Diabetes:  Current medications: Farxiga  10mg  daily, Glipizide  XL 10mg  daily, Metformin  1000mg  twice a day, Pioglitazone  30mg  daily, Lantus  16U daily in the morning Medications tried in the past: Ozempic  (too expensive)  Current glucose readings from 6/6 visit: 173, 201, 162 Current glucose readings from 6/3 visit: 196 (31st ate wrong), 181, 158, 156 Current glucose readings from 5/30 visit: 166 today (no insulin ); reports usually at 180's Using Accu-Chek Aviva Plus meter; testing 1 times daily   Patient denies hypoglycemic s/sx including dizziness, shakiness, sweating. Patient denies hyperglycemic symptoms including polyuria, polydipsia, polyphagia, nocturia,  neuropathy, blurred vision.  Current physical activity: Bikes 10 miles  Breakfast: Pillbury biscuit + piece of sausage Lunch: Half a salad, bologne sandwich on wheat bread Dinner (6PM): Cold plate (chicken salad, lettuce, tomato, cucumber, pears) + small portion of pasta salad, fried fish + slaw + hushpuppies  Current medication access support: [facility] Medicare Advantage   Objective:  Lab Results  Component Value Date   HGBA1C 9.9 (A) 11/30/2023    Lab Results  Component Value Date   CREATININE 1.08 04/27/2023   BUN 14 04/27/2023   NA 140 04/27/2023   K 4.6 04/27/2023   CL 101 04/27/2023   CO2 22 04/27/2023    Lab Results  Component Value Date   CHOL 96 12/19/2022   HDL 35 (L) 12/19/2022   LDLCALC 30 12/19/2022   TRIG 156 (H) 12/19/2022   CHOLHDL 2.7 12/19/2022    Medications Reviewed Today   Medications were not reviewed in this encounter       Assessment/Plan:   Diabetes: - Currently uncontrolled - Reviewed long term cardiovascular and renal outcomes of uncontrolled blood sugar - Reviewed goal A1c, goal fasting, and goal 2 hour post prandial glucose - Recommend to check glucose daily   Follow Up Plan:  - Follow-up call on 01/18/24 at 9AM for insulin  titration - INCREASE Lantus  20U daily in the morning today- do NOT increase until our next call - Will continue Glipizide  XL for now - Concerns that eating worse towards end of the year and not riding his bike as often has caused it to go up- more tired lately  *Patient will be out of town from 6/8 to 6/11 for birthday trip   Delvin File, PharmD [facility]  Phone Number: [phone]

## 2024-01-18 ENCOUNTER — Other Ambulatory Visit: Payer: Self-pay | Admitting: Pharmacist

## 2024-01-18 DIAGNOSIS — E1129 Type 2 diabetes mellitus with other diabetic kidney complication: Secondary | ICD-10-CM

## 2024-01-18 NOTE — Progress Notes (Signed)
 01/18/2024 Name: Stuart Henry MRN: [id number] DOB: 03-08-1941  Chief Complaint  Patient presents with   Diabetes    Stuart Henry is a 83 y.o. year old male who presented for a telephone visit.   They were referred to the pharmacist by their PCP for assistance in managing diabetes.   Referred for PAP assistance with Ozempic - patient does not qualify  Subjective:  Care Team: Primary Care Provider: Lamon Pillow, MD ; Next Scheduled Visit: 04/01/24 Clinical Pharmacist: Delvin File, PharmD  Medication Access/Adherence  Current Pharmacy:  [facility] [address], [place] - [address] [place] [address] Phone: [phone] Fax: [phone]  [facility] Delivery) - [place], [place] - [address] [facility] [address] [place] [address] [place] [address] Phone: [phone] Fax: [phone]  [facility] Delivery - [place], [place] - [address] [place] [address] Phone: [phone] Fax: [phone]   Patient reports affordability concerns with their medications: No  Patient reports access/transportation concerns to their pharmacy: No  Patient reports adherence concerns with their medications:  No     Diabetes:  Current medications: Farxiga  10mg  daily, Glipizide  XL 10mg  daily, Metformin  1000mg  twice a day, Pioglitazone  30mg  daily, Lantus  20U daily in the morning Medications tried in the past: Ozempic  (too expensive)  Current glucose readings from 6/16 visit: 140's at each check Current glucose readings from 6/6 visit: 173, 201, 162 Current glucose readings from 6/3 visit: 196 (31st ate wrong), 181, 158, 156 Current glucose readings from 5/30 visit: 166 today (no insulin ); reports usually at 180's Using Accu-Chek Aviva Plus meter; testing 1 times daily   Patient denies hypoglycemic s/sx including dizziness, shakiness, sweating. Patient denies hyperglycemic  symptoms including polyuria, polydipsia, polyphagia, nocturia, neuropathy, blurred vision.  Current physical activity: Bikes 10 miles  Breakfast: Pillbury biscuit + piece of sausage Lunch: Half a salad, bologne sandwich on wheat bread Dinner (6PM): Cold plate (chicken salad, lettuce, tomato, cucumber, pears) + small portion of pasta salad, fried fish + slaw + hushpuppies  Current medication access support: [facility] Medicare Advantage   Objective:  Lab Results  Component Value Date   HGBA1C 9.9 (A) 11/30/2023    Lab Results  Component Value Date   CREATININE 1.08 04/27/2023   BUN 14 04/27/2023   NA 140 04/27/2023   K 4.6 04/27/2023   CL 101 04/27/2023   CO2 22 04/27/2023    Lab Results  Component Value Date   CHOL 96 12/19/2022   HDL 35 (L) 12/19/2022   LDLCALC 30 12/19/2022   TRIG 156 (H) 12/19/2022   CHOLHDL 2.7 12/19/2022    Medications Reviewed Today   Medications were not reviewed in this encounter       Assessment/Plan:   Diabetes: - Currently uncontrolled - Reviewed long term cardiovascular and renal outcomes of uncontrolled blood sugar - Reviewed goal A1c, goal fasting, and goal 2 hour post prandial glucose - Recommend to check glucose daily   Follow Up Plan:  - Follow-up call on 02/01/24 at 9AM for insulin  titration - INCREASE Lantus  22U daily in the morning today- do NOT increase until our next call - Will continue Glipizide  XL for now - Concerns that eating worse towards end of the year and not riding his bike as often has caused it to go up- more tired lately  *Patient will be out of town from 6/8 to 6/11 for birthday trip  Delvin File, PharmD [facility]  Phone Number: [phone]

## 2024-02-01 ENCOUNTER — Other Ambulatory Visit: Payer: Self-pay | Admitting: Pharmacist

## 2024-02-01 DIAGNOSIS — R809 Proteinuria, unspecified: Secondary | ICD-10-CM

## 2024-02-01 NOTE — Progress Notes (Signed)
 02/01/2024 Name: Stuart Henry MRN: [id number] DOB: [birth year]  Chief Complaint  Patient presents with   Diabetes    Stuart Henry is a 83 y.o. year old male who presented for a telephone visit.   They were referred to the pharmacist by their PCP for assistance in managing diabetes.   Referred for PAP assistance with Ozempic - patient does not qualify  Subjective:  Care Team: Primary Care Provider: Gasper Nancyann BRAVO, MD ; Next Scheduled Visit: 04/01/24 Clinical Pharmacist: Aloysius Lewis, PharmD  Medication Access/Adherence  Current Pharmacy:  [facility] [address] [place] - [address] [place] [address] Phone: [phone] Fax: [phone]  [facility] Delivery) - [place], [place] - [address] [facility] [address] [place] [address] [place] [address] Phone: [phone] Fax: [phone]  [facility] Delivery - [place], [place] - [address] [place] [address] Phone: [phone] Fax: [phone]   Patient reports affordability concerns with their medications: No  Patient reports access/transportation concerns to their pharmacy: No  Patient reports adherence concerns with their medications:  No     Diabetes:  Current medications: Farxiga  10mg  daily, Glipizide  XL 10mg  daily, Metformin  1000mg  twice a day, Pioglitazone  30mg  daily, Lantus  22U daily in the morning Medications tried in the past: Ozempic  (too expensive)  Current glucose readings from 6/30 visit:  120's: 3 days (129, 128, 126) 130's: 2 days (132, 137) 140's: 140, 142, 144 150's: 152, 157, 152 160+: 175 (K[employer]), 163, 166 Current glucose readings from 6/16 visit: 140's at each check Current glucose readings from 6/6 visit: 173, 201, 162 Current glucose readings from 6/3 visit: 196 (31st ate wrong), 181, 158, 156 Current glucose readings from 5/30 visit: 166 today (no insulin ); reports  usually at 180's Using Accu-Chek Aviva Plus meter; testing 1 times daily   Patient denies hypoglycemic s/sx including dizziness, shakiness, sweating. Patient denies hyperglycemic symptoms including polyuria, polydipsia, polyphagia, nocturia, neuropathy, blurred vision.  Current physical activity: Bikes 10 miles  Breakfast: Pillbury biscuit + piece of sausage Lunch: Half a salad, bologne sandwich on wheat bread Dinner (6PM): Cold plate (chicken salad, lettuce, tomato, cucumber, pears) + small portion of pasta salad, fried fish + slaw + hushpuppies  Current medication access support: [facility] Medicare Advantage   Objective:  Lab Results  Component Value Date   HGBA1C 9.9 (A) 11/30/2023    Lab Results  Component Value Date   CREATININE 1.08 04/27/2023   BUN 14 04/27/2023   NA 140 04/27/2023   K 4.6 04/27/2023   CL 101 04/27/2023   CO2 22 04/27/2023    Lab Results  Component Value Date   CHOL 96 12/19/2022   HDL 35 (L) 12/19/2022   LDLCALC 30 12/19/2022   TRIG 156 (H) 12/19/2022   CHOLHDL 2.7 12/19/2022    Medications Reviewed Today   Medications were not reviewed in this encounter       Assessment/Plan:   Diabetes: - Currently uncontrolled - Reviewed long term cardiovascular and renal outcomes of uncontrolled blood sugar - Reviewed goal A1c, goal fasting, and goal 2 hour post prandial glucose - Recommend to check glucose daily   Follow Up Plan:  - Follow-up call on 02/15/24 at 9AM for insulin  titration - INCREASE Lantus  26U daily in the morning today- do NOT increase until our next call - Will continue Glipizide  XL for now - Continue to monitor for lows with correction if  needed- can also return to 22U daily if BG's are around 80    Aloysius Lewis, PharmD [facility]  Phone Number: [phone]

## 2024-02-15 ENCOUNTER — Other Ambulatory Visit: Payer: Self-pay | Admitting: Pharmacist

## 2024-02-15 NOTE — Progress Notes (Signed)
 02/15/2024 Name: Stuart Henry MRN: [id number] DOB: 30-Nov-1940  No chief complaint on file.   Stuart Henry is a 83 y.o. year old male who presented for a telephone visit.   They were referred to the pharmacist by their PCP for assistance in managing diabetes.   Referred for PAP assistance with Ozempic - patient does not qualify  Subjective:  Care Team: Primary Care Provider: Gasper Nancyann BRAVO, MD ; Next Scheduled Visit: 04/01/24 Clinical Pharmacist: Aloysius Lewis, PharmD  Medication Access/Adherence  Current Pharmacy:  [facility] [address] [place] - [address] [place] [address] Phone: [phone] Fax: [phone]  [facility] Delivery) - [place], [place] - [address] [facility] [address] [place] [address] [place] [address] Phone: [phone] Fax: [phone]  [facility] Delivery - [place], [place] - [address] [place] [address] Phone: [phone] Fax: [phone]   Patient reports affordability concerns with their medications: No  Patient reports access/transportation concerns to their pharmacy: No  Patient reports adherence concerns with their medications:  No     Diabetes:  Current medications: Farxiga  10mg  daily, Glipizide  XL 10mg  daily, Metformin  1000mg  twice a day, Pioglitazone  30mg  daily, Lantus  26U daily in the morning Medications tried in the past: Ozempic  (too expensive)  Current glucose readings from 7/14 visit: 5 days: <120 3 days: 130's 3 days: 150's 3 days: 160+ (highest 174) 1 low of 82 at nighttime- felt shakey Average: 136  Current glucose readings from 6/30 visit:  120's: 3 days (129, 128, 126) 130's: 2 days (132, 137) 140's: 140, 142, 144 150's: 152, 157, 152 160+: 175 (K[employer]), 163, 166 Current glucose readings from 6/16 visit: 140's at each check Current glucose readings from 6/6 visit: 173, 201, 162 Current  glucose readings from 6/3 visit: 196 (31st ate wrong), 181, 158, 156 Current glucose readings from 5/30 visit: 166 today (no insulin ); reports usually at 180's Using Accu-Chek Aviva Plus meter; testing 1 times daily   Patient denies hypoglycemic s/sx including dizziness, shakiness, sweating. Patient denies hyperglycemic symptoms including polyuria, polydipsia, polyphagia, nocturia, neuropathy, blurred vision.  Current physical activity: Bikes 10 miles  Breakfast: Pillbury biscuit + piece of sausage Lunch: Half a salad, bologne sandwich on wheat bread Dinner (6PM): Cold plate (chicken salad, lettuce, tomato, cucumber, pears) + small portion of pasta salad, fried fish + slaw + hushpuppies  Current medication access support: [facility] Medicare Advantage   Objective:  Lab Results  Component Value Date   HGBA1C 9.9 (A) 11/30/2023    Lab Results  Component Value Date   CREATININE 1.08 04/27/2023   BUN 14 04/27/2023   NA 140 04/27/2023   K 4.6 04/27/2023   CL 101 04/27/2023   CO2 22 04/27/2023    Lab Results  Component Value Date   CHOL 96 12/19/2022   HDL 35 (L) 12/19/2022   LDLCALC 30 12/19/2022   TRIG 156 (H) 12/19/2022   CHOLHDL 2.7 12/19/2022    Medications Reviewed Today   Medications were not reviewed in this encounter       Assessment/Plan:   Diabetes: - Currently uncontrolled - Reviewed long term cardiovascular and renal outcomes of uncontrolled blood sugar - Reviewed goal A1c, goal fasting, and goal 2 hour post prandial glucose - Recommend to check glucose daily   Follow Up Plan:  - Follow-up call on 03/07/24 at 9AM for post-prandial BG review - Continue Lantus  26U daily  in the morning  - Will continue Glipizide  XL for now - Start checking 2 hours after meals instead- goal is <180  - Next A1c on 8/29 will reflect 3 month A1c on insulin , but only 1.5 months with BG's near at goal    Aloysius Lewis, PharmD [facility]  Phone Number: [phone]

## 2024-02-29 ENCOUNTER — Other Ambulatory Visit: Payer: Self-pay | Admitting: Internal Medicine

## 2024-03-07 ENCOUNTER — Other Ambulatory Visit: Payer: Self-pay | Admitting: Pharmacist

## 2024-03-07 ENCOUNTER — Other Ambulatory Visit: Payer: Self-pay

## 2024-03-07 DIAGNOSIS — R809 Proteinuria, unspecified: Secondary | ICD-10-CM

## 2024-03-07 MED ORDER — DAPAGLIFLOZIN PROPANEDIOL 10 MG PO TABS: 10.0000 mg | ORAL_TABLET | Freq: Every day | ORAL | 0 refills | Status: DC

## 2024-03-07 MED ORDER — GLIPIZIDE 5 MG PO TABS: 5.0000 mg | ORAL_TABLET | Freq: Two times a day (BID) | ORAL | 0 refills | Status: DC

## 2024-03-07 NOTE — Progress Notes (Addendum)
 03/07/2024 Name: Stuart Henry MRN: [id number] DOB: Dec 06, 1940  Chief Complaint  Patient presents with   Diabetes    Stuart Henry is a 83 y.o. year old male who presented for a telephone visit.   They were referred to the pharmacist by their PCP for assistance in managing diabetes.   Referred for PAP assistance with Ozempic - patient does not qualify  Subjective:  Care Team: Primary Care Provider: Gasper Nancyann BRAVO, MD ; Next Scheduled Visit: 04/01/24 Clinical Pharmacist: Aloysius Lewis, PharmD  Medication Access/Adherence  Current Pharmacy:  [facility] [address] [place] - [address] [place] [address] Phone: [phone] Fax: [phone]  [facility] Delivery) - [place], [place] - [address] [facility] [address] [place] [address] [place] [address] Phone: [phone] Fax: [phone]  [facility] Delivery - [place], [place] - [address] [place] [address] Phone: [phone] Fax: [phone]   Patient reports affordability concerns with their medications: No  Patient reports access/transportation concerns to their pharmacy: No  Patient reports adherence concerns with their medications:  No     Diabetes:  Current medications: Farxiga  10mg  daily, Glipizide  XL 10mg  daily, Metformin  1000mg  twice a day, Pioglitazone  30mg  daily, Lantus  26U daily in the morning Medications tried in the past: Ozempic  (too expensive)  Post-prandial glucose readings from 8/4 visit:  248, 209, 161 (best), 350 (worst), 286, 269  Current glucose readings from 7/14 visit: 5 days: <120 3 days: 130's 3 days: 150's 3 days: 160+ (highest 174) 1 low of 82 at nighttime- felt shakey Average: 136  Current glucose readings from 6/30 visit:  120's: 3 days (129, 128, 126) 130's: 2 days (132, 137) 140's: 140, 142, 144 150's: 152, 157, 152 160+: 175 ([employer]), 163, 166 Current  glucose readings from 6/16 visit: 140's at each check Current glucose readings from 6/6 visit: 173, 201, 162 Current glucose readings from 6/3 visit: 196 (31st ate wrong), 181, 158, 156 Current glucose readings from 5/30 visit: 166 today (no insulin ); reports usually at 180's Using Accu-Chek Aviva Plus meter; testing 1 times daily   Patient denies hypoglycemic s/sx including dizziness, shakiness, sweating. Patient denies hyperglycemic symptoms including polyuria, polydipsia, polyphagia, nocturia, neuropathy, blurred vision.  Current physical activity: Bikes 10 miles  Breakfast: Pillbury biscuit + piece of sausage Lunch: Half a salad, bologne sandwich on wheat bread Dinner (6PM): Cold plate (chicken salad, lettuce, tomato, cucumber, pears) + small portion of pasta salad, fried fish + slaw + hushpuppies  Current medication access support: [facility] Medicare Advantage   Objective:  Lab Results  Component Value Date   HGBA1C 9.9 (A) 11/30/2023    Lab Results  Component Value Date   CREATININE 1.08 04/27/2023   BUN 14 04/27/2023   NA 140 04/27/2023   K 4.6 04/27/2023   CL 101 04/27/2023   CO2 22 04/27/2023    Lab Results  Component Value Date   CHOL 96 12/19/2022   HDL 35 (L) 12/19/2022   LDLCALC 30 12/19/2022   TRIG 156 (H) 12/19/2022   CHOLHDL 2.7 12/19/2022    Medications Reviewed Today   Medications were not reviewed in this encounter       Assessment/Plan:   Diabetes: - Currently uncontrolled - Reviewed long term cardiovascular and renal outcomes of uncontrolled blood sugar - Reviewed goal A1c, goal fasting, and goal 2 hour post prandial glucose - Recommend to check glucose daily  Follow Up Plan:  - Follow-up call on 03/21/24 at 9AM for post-prandial BG review - Continue Lantus  26U daily in the morning  - STOP Glipizide  XL; START Glipizide  IR 5mg  twice a day prior to meals - Continue checking 2 hours after meals instead- goal is <180  - Next A1c on 8/29 will  reflect 3 month A1c on insulin , but only 1.5 months with BG's near at goal - Patient is out of Farxiga - advised to call [facility] for a refill  - Spoke with [facility] and the report it was shipped out this morning for 90DS    Aloysius Lewis, PharmD [facility]

## 2024-03-21 ENCOUNTER — Other Ambulatory Visit: Payer: Self-pay | Admitting: Pharmacist

## 2024-03-21 NOTE — Progress Notes (Signed)
 03/21/2024 Name: Stuart Henry MRN: [id number] DOB: [birth year]  No chief complaint on file.   Stuart Henry is a 83 y.o. year old male who presented for a telephone visit.   They were referred to the pharmacist by their PCP for assistance in managing diabetes.   Referred for PAP assistance with Ozempic - patient does not qualify  Subjective:  Care Team: Primary Care Provider: Gasper Nancyann BRAVO, MD ; Next Scheduled Visit: 04/01/24 Clinical Pharmacist: Aloysius Lewis, PharmD  Medication Access/Adherence  Current Pharmacy:  [facility] [address] [place] - [address] [place] [address] Phone: [phone] Fax: [phone]  [facility] Delivery) - [address], [place] - [address] [facility] [address] [place] [address] [place] [address] Phone: [phone] Fax: [phone]  [facility] Delivery - [place], [place] - [address] [place] [address] Phone: [phone] Fax: [phone]   Patient reports affordability concerns with their medications: No  Patient reports access/transportation concerns to their pharmacy: No  Patient reports adherence concerns with their medications:  No     Diabetes:  Current medications: Farxiga  10mg  daily, Glipizide  IR 5mg  twice a day with meals, Metformin  1000mg  twice a day, Pioglitazone  30mg  daily, Lantus  26U daily in the morning Medications tried in the past: Ozempic  (too expensive)  Readings from 8/18 visit:   Fasting:  129, 146, 146, 156, 143, 164, 116, 103, 116  Post-prandial:  AM: 159, 161, 156 PM: 233, 232  Post-prandial glucose readings from 8/4 visit:  248, 209, 161 (best), 350 (worst), 286, 269  Current glucose readings from 7/14 visit: 5 days: <120 3 days: 130's 3 days: 150's 3 days: 160+ (highest 174) 1 low of 82 at nighttime- felt shakey Average: 136  Current glucose readings from 6/30 visit:  120's: 3 days  (129, 128, 126) 130's: 2 days (132, 137) 140's: 140, 142, 144 150's: 152, 157, 152 160+: 175 ([employer]), 163, 166 Current glucose readings from 6/16 visit: 140's at each check Current glucose readings from 6/6 visit: 173, 201, 162 Current glucose readings from 6/3 visit: 196 (31st ate wrong), 181, 158, 156 Current glucose readings from 5/30 visit: 166 today (no insulin ); reports usually at 180's Using Accu-Chek Aviva Plus meter; testing 1 times daily   Patient denies hypoglycemic s/sx including dizziness, shakiness, sweating. Patient denies hyperglycemic symptoms including polyuria, polydipsia, polyphagia, nocturia, neuropathy, blurred vision.  Current physical activity: Bikes 10 miles  Breakfast: Pillbury biscuit + piece of sausage Lunch: Half a salad, bologne sandwich on wheat bread Dinner (6PM): Cold plate (chicken salad, lettuce, tomato, cucumber, pears) + small portion of pasta salad, fried fish + slaw + hushpuppies  Current medication access support: [facility] Medicare Advantage   Objective:  Lab Results  Component Value Date   HGBA1C 9.9 (A) 11/30/2023    Lab Results  Component Value Date   CREATININE 1.08 04/27/2023   BUN 14 04/27/2023   NA 140 04/27/2023   K 4.6 04/27/2023   CL 101 04/27/2023   CO2 22 04/27/2023    Lab Results  Component Value Date   CHOL 96 12/19/2022   HDL 35 (L) 12/19/2022   LDLCALC 30 12/19/2022   TRIG 156 (H) 12/19/2022   CHOLHDL 2.7 12/19/2022    Medications Reviewed Today   Medications were not reviewed in this encounter       Assessment/Plan:   Diabetes: - Currently uncontrolled - Reviewed long term cardiovascular  and renal outcomes of uncontrolled blood sugar - Reviewed goal A1c, goal fasting, and goal 2 hour post prandial glucose - Recommend to check glucose daily   Follow Up Plan:  - Follow-up call on 04/29/24 at 9AM for post-prandial BG review - Continue Lantus  26U daily in the morning  - Continue Glipizide  IR  5mg  twice a day prior to meals - Continue checking 2 hours after meals instead- goal is <180  - Next A1c on 8/29 will reflect 3 month A1c on insulin , but only 1.5 months with BG's near at goal - BG's are at goal if eating appropriately the night prior- goal is to focus on diet currently  - Eats peanut butter sandwiches late at night occasionally   - Only concern listed is less energy and bladder sensation with slight pain making it feel like he needs to use the restroom (tolerable though)  -Advised that fatigue will go away once his body adjusts to the lower BG levels again  - For pain, could consider switching to leg or arm injections; however has low fat on thighs per report- will continue with current plan and bring it up if again if severity worsens    Aloysius Lewis, PharmD [facility]

## 2024-03-24 ENCOUNTER — Ambulatory Visit: Attending: Internal Medicine | Admitting: Internal Medicine

## 2024-03-24 VITALS — BP 122/72 | HR 69 | Ht 75.0 in | Wt 208.0 lb

## 2024-03-24 DIAGNOSIS — I10 Essential (primary) hypertension: Secondary | ICD-10-CM

## 2024-03-24 DIAGNOSIS — I2510 Atherosclerotic heart disease of native coronary artery without angina pectoris: Secondary | ICD-10-CM | POA: Diagnosis not present

## 2024-03-24 DIAGNOSIS — R5383 Other fatigue: Secondary | ICD-10-CM

## 2024-03-24 DIAGNOSIS — Z79899 Other long term (current) drug therapy: Secondary | ICD-10-CM

## 2024-03-24 DIAGNOSIS — E1169 Type 2 diabetes mellitus with other specified complication: Secondary | ICD-10-CM | POA: Diagnosis not present

## 2024-03-24 DIAGNOSIS — E785 Hyperlipidemia, unspecified: Secondary | ICD-10-CM

## 2024-03-24 MED ORDER — NITROGLYCERIN 0.4 MG SL SUBL: SUBLINGUAL_TABLET | SUBLINGUAL | 3 refills | Status: AC

## 2024-03-24 MED ORDER — LISINOPRIL 40 MG PO TABS: 40.0000 mg | ORAL_TABLET | Freq: Every day | ORAL | 0 refills | Status: DC

## 2024-03-24 NOTE — Progress Notes (Unsigned)
  Cardiology Office Note:  .   Date:  03/24/2024  ID:  Stuart Henry, DOB 02/12/1941, MRN [id number] PCP: Gasper Nancyann BRAVO, MD  [facility] Providers Cardiologist:  Lonni Hanson, MD { Click to update primary MD,subspecialty MD or APP then REFRESH:1}    History of Present Illness: .   Stuart Henry is a 83 y.o. 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  I last saw him a year ago, at which time he was doing fairly well but noted reduced physical activity as he was caring for his wife.  We did not make any medication changes or pursue additional testing.  Final decided he needs insulin  and has started taking Lantus .  Overall glycemic control is much better.  On for ~6 weeks.  Has appointment with Dr. Gasper 29th.  Some fatigue.  Doesn't have as much strength.  Plays golf but not exercising otherwise.  Wants to return to the gym.  No CP.  LE edema.  Hurt left deltoid.  Falls asleep easily.  Needs labs  ROS: See HPI  Studies Reviewed: SABRA   EKG Interpretation Date/Time:  Thursday March 24 2024 09:19:42 EDT Ventricular Rate:  69 PR Interval:  240 QRS Duration:  90 QT Interval:  402 QTC Calculation: 430 R Axis:   -46  Text Interpretation: Sinus rhythm with 1st degree A-V block with Premature atrial complexes Left axis deviation Nonspecific T wave abnormality Inferior infarct (cited on or before 24-Sep-2016) Abnormal ECG When compared with ECG of 27-Nov-2022 Premature atrial complexes are now Present Confirmed by Thomasa Heidler, Lonni [id number]) on 03/24/2024 9:29:13 AM    *** Risk Assessment/Calculations:   {Does this patient have ATRIAL FIBRILLATION?:[phone]}         Physical Exam:   VS:  BP 122/72 (BP  Location: Left Arm, Patient Position: Sitting, Cuff Size: Normal)   Pulse 69   Ht 6' 3 (1.905 m)   Wt 208 lb (94.3 kg)   SpO2 98%   BMI 26.00 kg/m    Wt Readings from Last 3 Encounters:  03/24/24 208 lb (94.3 kg)  11/30/23 204 lb 14.4 oz (92.9 kg)  08/31/23 204 lb (92.5 kg)    General:  NAD. Neck: No JVD or HJR. Lungs: Clear to auscultation bilaterally without wheezes or crackles. Heart: Regular rate and rhythm without murmurs, rubs, or gallops. Abdomen: Soft, nontender, nondistended. Extremities: No lower extremity edema.  ASSESSMENT AND PLAN: .    ***    {Are you ordering a CV Procedure (e.g. stress test, cath, DCCV, TEE, etc)?   Press F2        :[id number]}  Dispo: ***  Signed, Lonni Hanson, MD

## 2024-03-24 NOTE — Patient Instructions (Signed)
 Medication Instructions:  Your physician recommends that you continue on your current medications as directed. Please refer to the Current Medication list given to you today.    *If you need a refill on your cardiac medications before your next appointment, please call your pharmacy*  Lab Work: Your provider would like for you to have following labs drawn today CBC, CMP, TSH, Lipid.     Testing/Procedures:    Please report to Radiology at the [facility] Main Entrance 30 minutes early for your test.  [address] [place], [place] [address]                         OR   Please report to Radiology at [facility] Main Entrance, medical mall, 30 mins prior to your test.  [address]  [place], [place]  How to Prepare for Your Cardiac PET/CT Stress Test:  Nothing to eat or drink, except water, 3 hours prior to arrival time.  NO caffeine/decaffeinated products, or chocolate 12 hours prior to arrival. (Please note decaffeinated beverages (teas/coffees) still contain caffeine).  If you have caffeine within 12 hours prior, the test will need to be rescheduled.  Medication instructions: Do not take erectile dysfunction medications for 72 hours prior to test (sildenafil, tadalafil) Do not take nitrates (isosorbide  mononitrate, Ranexa) the day before or day of test Do not take tamsulosin  the day before or morning of test Hold theophylline containing medications for 12 hours. Hold Dipyridamole 48 hours prior to the test.  Diabetic Preparation: If able to eat breakfast prior to 3 hour fasting, you may take all medications, including your insulin . Do not worry if you miss your breakfast dose of insulin  - start at your next meal. If you do not eat prior to 3 hour fast-Hold all diabetes (oral and insulin ) medications. Patients who wear a continuous glucose monitor MUST remove the device prior to scanning.  You may take your remaining medications with  water.  NO perfume, cologne or lotion on chest or abdomen area. FEMALES - Please avoid wearing dresses to this appointment.  Total time is 1 to 2 hours; you may want to bring reading material for the waiting time.  IF YOU THINK YOU MAY BE PREGNANT, OR ARE NURSING PLEASE INFORM THE TECHNOLOGIST.  In preparation for your appointment, medication and supplies will be purchased.  Appointment availability is limited, so if you need to cancel or reschedule, please call the Radiology Department Scheduler at [phone] 24 hours in advance to avoid a cancellation fee of $100.00  What to Expect When you Arrive:  Once you arrive and check in for your appointment, you will be taken to a preparation room within the Radiology Department.  A technologist or Nurse will obtain your medical history, verify that you are correctly prepped for the exam, and explain the procedure.  Afterwards, an IV will be started in your arm and electrodes will be placed on your skin for EKG monitoring during the stress portion of the exam. Then you will be escorted to the PET/CT scanner.  There, staff will get you positioned on the scanner and obtain a blood pressure and EKG.  During the exam, you will continue to be connected to the EKG and blood pressure machines.  A small, safe amount of a radioactive tracer will be injected in your IV to obtain a series of pictures of your heart along with an injection of a stress agent.  After your Exam:  It is recommended that you eat a meal and drink a caffeinated beverage to counter act any effects of the stress agent.  Drink plenty of fluids for the remainder of the day and urinate frequently for the first couple of hours after the exam.  Your doctor will inform you of your test results within 7-10 business days.  For more information and frequently asked questions, please visit our website: [email or web address]  For questions about your test or how to prepare for your  test, please call: Cardiac Imaging Nurse Navigators Office: [phone]   Follow-Up: At [facility], you and your health needs are our priority.  As part of our continuing mission to provide you with exceptional heart care, our providers are all part of one team.  This team includes your primary Cardiologist (physician) and Advanced Practice Providers or APPs (Physician Assistants and Nurse Practitioners) who all work together to provide you with the care you need, when you need it.  Your next appointment:   6 month(s)  Provider:   You may see Lonni Hanson, MD or one of the following Advanced Practice Providers on your designated Care Team:   Lonni Meager, NP Lesley Maffucci, PA-C Bernardino Bring, PA-C Cadence [place], PA-C Tylene Lunch, NP Barnie Hila, NP

## 2024-03-25 ENCOUNTER — Encounter: Payer: Self-pay | Admitting: Internal Medicine

## 2024-03-25 ENCOUNTER — Ambulatory Visit: Payer: Self-pay | Admitting: Internal Medicine

## 2024-03-25 LAB — COMPREHENSIVE METABOLIC PANEL WITH GFR
ALT: 16 IU/L (ref 0–44)
AST: 13 IU/L (ref 0–40)
Albumin: 4.6 g/dL (ref 3.7–4.7)
Alkaline Phosphatase: 42 IU/L — ABNORMAL LOW (ref 44–121)
BUN/Creatinine Ratio: 16 (ref 10–24)
BUN: 22 mg/dL (ref 8–27)
Bilirubin Total: 0.6 mg/dL (ref 0.0–1.2)
CO2: 21 mmol/L (ref 20–29)
Calcium: 9.7 mg/dL (ref 8.6–10.2)
Chloride: 102 mmol/L (ref 96–106)
Creatinine, Ser: 1.37 mg/dL — ABNORMAL HIGH (ref 0.76–1.27)
Globulin, Total: 2.2 g/dL (ref 1.5–4.5)
Glucose: 166 mg/dL — ABNORMAL HIGH (ref 70–99)
Potassium: 5 mmol/L (ref 3.5–5.2)
Sodium: 140 mmol/L (ref 134–144)
Total Protein: 6.8 g/dL (ref 6.0–8.5)
eGFR: 52 mL/min/1.73 — ABNORMAL LOW (ref >59)

## 2024-03-25 LAB — CBC
Hematocrit: 42.2 % (ref 37.5–51.0)
Hemoglobin: 13.7 g/dL (ref 13.0–17.7)
MCH: 29.8 pg (ref 26.6–33.0)
MCHC: 32.5 g/dL (ref 31.5–35.7)
MCV: 92 fL (ref 79–97)
Platelets: 197 x10E3/uL (ref 150–450)
RBC: 4.59 x10E6/uL (ref 4.14–5.80)
RDW: 13.2 % (ref 11.6–15.4)
WBC: 6.2 x10E3/uL (ref 3.4–10.8)

## 2024-03-25 LAB — LIPID PANEL
Chol/HDL Ratio: 3.2 ratio (ref 0.0–5.0)
Cholesterol, Total: 106 mg/dL (ref 100–199)
HDL: 33 mg/dL — ABNORMAL LOW (ref >39)
LDL Chol Calc (NIH): 47 mg/dL (ref 0–99)
Triglycerides: 148 mg/dL (ref 0–149)
VLDL Cholesterol Cal: 26 mg/dL (ref 5–40)

## 2024-03-25 LAB — TSH: TSH: 4.75 u[IU]/mL — ABNORMAL HIGH (ref 0.450–4.500)

## 2024-03-25 NOTE — Telephone Encounter (Signed)
 Pt returning call

## 2024-03-25 NOTE — Telephone Encounter (Signed)
 Called patient back and read result note from Dr. Mady - advised to drink plenty of water daily as patient states he only drinks <16 oz of Propel and then drinks Dr.Papper

## 2024-03-31 ENCOUNTER — Other Ambulatory Visit: Payer: Self-pay | Admitting: Family Medicine

## 2024-04-01 ENCOUNTER — Ambulatory Visit: Admitting: Family Medicine

## 2024-04-01 VITALS — BP 140/81 | HR 55 | Temp 97.6°F | Ht 75.0 in | Wt 213.4 lb

## 2024-04-01 DIAGNOSIS — R809 Proteinuria, unspecified: Secondary | ICD-10-CM

## 2024-04-01 DIAGNOSIS — M25512 Pain in left shoulder: Secondary | ICD-10-CM

## 2024-04-01 DIAGNOSIS — E1129 Type 2 diabetes mellitus with other diabetic kidney complication: Secondary | ICD-10-CM

## 2024-04-01 DIAGNOSIS — I10 Essential (primary) hypertension: Secondary | ICD-10-CM | POA: Diagnosis not present

## 2024-04-01 DIAGNOSIS — R7989 Other specified abnormal findings of blood chemistry: Secondary | ICD-10-CM | POA: Diagnosis not present

## 2024-04-01 DIAGNOSIS — G8929 Other chronic pain: Secondary | ICD-10-CM

## 2024-04-01 LAB — POCT GLYCOSYLATED HEMOGLOBIN (HGB A1C): Hemoglobin A1C: 8.9 % — AB (ref 4.0–5.6)

## 2024-04-01 MED ORDER — FREESTYLE LIBRE 14 DAY SENSOR MISC
1.0000 | 12 refills | Status: DC
Start: 2024-04-01 — End: 2024-04-01

## 2024-04-01 MED ORDER — FREESTYLE LIBRE 14 DAY READER DEVI: 1 refills | Status: DC

## 2024-04-01 MED ORDER — LEVOTHYROXINE SODIUM 25 MCG PO TABS: 25.0000 ug | ORAL_TABLET | Freq: Every day | ORAL | 2 refills | Status: DC

## 2024-04-01 MED ORDER — FREESTYLE LIBRE 14 DAY SENSOR MISC: 1.0000 | 12 refills | Status: DC

## 2024-04-01 NOTE — Patient Instructions (Signed)
 Please review the attached list of medications and notify my office if there are any errors.   You can use over the count diclofenac (Voltaren) gel or patch on your shoulder. You can continue over the counter Tylenol 

## 2024-04-01 NOTE — Progress Notes (Signed)
 Established patient visit   Patient: Stuart Henry   DOB: 1940-09-15   83 y.o. Male  MRN: [id number] Visit Date: 04/01/2024  Today's healthcare provider: Nancyann Perry, MD   No chief complaint on file.  Subjective    Discussed the use of AI scribe software for clinical note transcription with the patient, who gave verbal consent to proceed.  History of Present Illness   Stuart Henry is an 83 year old male with type 2 diabetes who presents for follow-up on blood sugar management.  He was last seen in April when his hemoglobin A1c was 9.9. Since then, he has started on Lantus  insulin , currently taking 26 units daily. His blood sugar levels in the morning range from 99 to 150, with an average in the low 130s. No issues with hypoglycemia, although he experienced a sensation of low blood sugar at 3 AM, but his glucose was 120. He checks his blood sugar two hours postprandial occasionally, noting morning levels around 156 and afternoon levels consistently at 233. His most recent A1c is 8.9.  He has a history of coronary artery disease and underwent bypass surgery approximately seven and a half years ago. He is scheduled for a stress test soon.  He reports a history of kidney function slightly below normal with a creatinine level of 52, and a thyroid function that is slightly low. He reports experiencing fatigue. He is not currently taking any thyroid medication.  He takes ibuprofen and Tylenol  for muscle pain, particularly in his deltoid and lower back, which he attributes to past weightlifting injuries. He has been using ibuprofen daily for the past few weeks. He also takes Farxiga .  He inquires about the availability of the COVID-19 vaccine and is interested in receiving it.     Lab Results  Component Value Date   NA 140 03/24/2024   K 5.0 03/24/2024   CREATININE 1.37 (H) 03/24/2024   EGFR 52 (L) 03/24/2024   GLUCOSE 166 (H) 03/24/2024   Lab Results  Component Value Date    TSH 4.750 (H) 03/24/2024   T4TOTAL 6.9 03/28/2016    Lab Results  Component Value Date   HGBA1C 8.9 (A) 04/01/2024   HGBA1C 9.9 (A) 11/30/2023   HGBA1C 9.0 (A) 08/31/2023   Wt Readings from Last 3 Encounters:  04/01/24 213 lb 6.4 oz (96.8 kg)  03/24/24 208 lb (94.3 kg)  11/30/23 204 lb 14.4 oz (92.9 kg)      Medications: Outpatient Medications Prior to Visit  Medication Sig   ACCU-CHEK AVIVA PLUS test strip USE AS INSTRUCTED TO CHECK BLOOD SUGAR ONCE DAILY FOR TYPE 2  DIABETES   allopurinol  (ZYLOPRIM ) 100 MG tablet Take 1 tablet by mouth once daily   aspirin  EC 81 MG tablet Take 1 tablet (81 mg total) by mouth daily.   Blood Glucose Monitoring Suppl (ACCU-CHEK AVIVA PLUS) w/Device KIT USE TO CHECK BLOOD SUGAR  DAILY AS DIRECTED   dapagliflozin  propanediol (FARXIGA ) 10 MG TABS tablet Take 1 tablet (10 mg total) by mouth daily before breakfast.   gabapentin  (NEURONTIN ) 300 MG capsule TAKE 1 CAPSULE BY MOUTH AT BEDTIME FOR  NEUROPATHY   glipiZIDE  (GLUCOTROL ) 5 MG tablet Take 1 tablet (5 mg total) by mouth 2 (two) times daily before a meal.   insulin  glargine (LANTUS  SOLOSTAR) 100 UNIT/ML Solostar Pen Inject 10 units into the skin each morning. Increase by 3 units every 3 days until fasting sugar is under 120. Max dose of 50  units daily.   Insulin  Pen Needle (PEN NEEDLES) 32G X 4 MM MISC Use to inject insulin  daily as prescribed. Dx code E11.29   ketoconazole  (NIZORAL ) 2 % shampoo SHAMPOO   TOPICALLY TWICE A WEEK   lisinopril  (ZESTRIL ) 40 MG tablet Take 1 tablet (40 mg total) by mouth daily.   metFORMIN  (GLUCOPHAGE ) 1000 MG tablet TAKE 1 TABLET BY MOUTH TWICE DAILY WITH MEALS   nitroGLYCERIN  (NITROSTAT ) 0.4 MG SL tablet DISSOLVE ONE TABLET UNDER THE TONGUE EVERY 5 MINUTES AS NEEDED FOR CHEST PAIN.  DO NOT EXCEED A TOTAL OF 3 DOSES IN 15 MINUTES   omeprazole  (PRILOSEC) 40 MG capsule Take 1 capsule (40 mg total) by mouth in the morning and at bedtime. Take only as needed for heartburn  and acid refux   pioglitazone  (ACTOS ) 30 MG tablet Take 1 tablet by mouth once daily   rosuvastatin  (CRESTOR ) 10 MG tablet Take 1 tablet by mouth once daily   tamsulosin  (FLOMAX ) 0.4 MG CAPS capsule Take 1 capsule (0.4 mg total) by mouth daily after supper.   No facility-administered medications prior to visit.   Review of Systems  Constitutional:  Negative for appetite change, chills and fever.  Respiratory:  Negative for chest tightness, shortness of breath and wheezing.   Cardiovascular:  Negative for chest pain and palpitations.  Gastrointestinal:  Negative for abdominal pain, nausea and vomiting.       Objective    BP (!) 140/81   Pulse (!) 55   Temp 97.6 F (36.4 C) (Oral)   Ht 6' 3 (1.905 m)   Wt 213 lb 6.4 oz (96.8 kg)   SpO2 100%   BMI 26.67 kg/m   Physical Exam   General appearance: Well developed, well nourished male, cooperative and in no acute distress Head: Normocephalic, without obvious abnormality, atraumatic Respiratory: Respirations even and unlabored, normal respiratory rate Extremities: All extremities are intact.  Skin: Skin color, texture, turgor normal. No rashes seen  Psych: Appropriate mood and affect. Neurologic: Mental status: Alert, oriented to person, place, and time, thought content appropriate.   Results for orders placed or performed in visit on 04/01/24  POCT glycosylated hemoglobin (Hb A1C)  Result Value Ref Range   Hemoglobin A1C 8.9 (A) 4.0 - 5.6 %     Assessment & Plan    1. Type 2 diabetes mellitus with diabetic microalbuminuria, without long-term current use of insulin  (HCC) (Primary) Some improvement but not to goal with initiation of insulin . On 26 units for about 3 weeks.   - Continuous Glucose Receiver (FREESTYLE LIBRE 14 DAY READER) DEVI; Use to check blood sugar via CGM.  Dispense: 1 each; Refill: 1 - Continuous Glucose Sensor (FREESTYLE LIBRE 14 DAY SENSOR) MISC; Place 1 Device onto the skin every 14 (fourteen) days. for  insulin  dependent type 2 diabetes  Dispense: 2 each; Refill: 12  2. Essential hypertension Near goal. Continue current medications.    3. Elevated TSH He does report more fatigue and interested in trying thyroid replacement.   start- levothyroxine  (SYNTHROID ) 25 MCG tablet; Take 1 tablet (25 mcg total) by mouth daily.  Dispense: 30 tablet; Refill: 2  4. Elevated serum creatinine Likely related to regular OTC ibuprofen use. Advised to avoid oral nsaids. Can try topical.   5. Chronic left shoulder pain Change oral NSAID to topical as above. Can continue tylenol .    Return in about 2 months (around 06/01/2024) for Diabetes.     Nancyann Perry, MD  [facility]  [phone] (phone) [phone] (fax)  [facility]

## 2024-04-12 ENCOUNTER — Other Ambulatory Visit: Payer: Self-pay | Admitting: Family Medicine

## 2024-04-12 DIAGNOSIS — L219 Seborrheic dermatitis, unspecified: Secondary | ICD-10-CM

## 2024-04-12 MED ORDER — COVID-19 MRNA VAC-TRIS(PFIZER) 30 MCG/0.3ML IM SUSY: 0.3000 mL | PREFILLED_SYRINGE | Freq: Once | INTRAMUSCULAR | 0 refills | Status: AC

## 2024-04-14 ENCOUNTER — Ambulatory Visit
Admission: RE | Admit: 2024-04-14 | Discharge: 2024-04-14 | Disposition: A | Discharge status: Home / Self Care | Source: Ambulatory Visit | Attending: Internal Medicine | Admitting: Internal Medicine

## 2024-04-14 DIAGNOSIS — I2510 Atherosclerotic heart disease of native coronary artery without angina pectoris: Secondary | ICD-10-CM | POA: Diagnosis present

## 2024-04-14 DIAGNOSIS — M47814 Spondylosis without myelopathy or radiculopathy, thoracic region: Secondary | ICD-10-CM | POA: Insufficient documentation

## 2024-04-14 DIAGNOSIS — Z951 Presence of aortocoronary bypass graft: Secondary | ICD-10-CM | POA: Insufficient documentation

## 2024-04-14 DIAGNOSIS — R5383 Other fatigue: Secondary | ICD-10-CM | POA: Diagnosis not present

## 2024-04-14 DIAGNOSIS — I700 Atherosclerosis of aorta: Secondary | ICD-10-CM | POA: Diagnosis not present

## 2024-04-14 DIAGNOSIS — K449 Diaphragmatic hernia without obstruction or gangrene: Secondary | ICD-10-CM | POA: Insufficient documentation

## 2024-04-14 LAB — NM PET CT CARDIAC PERFUSION MULTI W/ABSOLUTE BLOODFLOW
LV dias vol: 127 mL (ref 62–150)
Nuc Rest EF: 56 %
Nuc Stress EF: 51 %
Peak HR: 63 {beats}/min
Rest HR: 51 {beats}/min
Rest Nuclear Isotope Dose: 25 mCi
SRS: 0
SSS: 13
ST Depression (mm): 0 mm
Stress Nuclear Isotope Dose: 25 mCi
TID: 1.11

## 2024-04-14 MED ORDER — REGADENOSON 0.4 MG/5ML IV SOLN
0.4000 mg | Freq: Once | INTRAVENOUS | Status: AC
  Administered 2024-04-14: 0.4 mg via INTRAVENOUS
  Filled 2024-04-14: qty 5

## 2024-04-14 MED ORDER — REGADENOSON 0.4 MG/5ML IV SOLN
INTRAVENOUS | Status: AC
Start: 2024-04-14 — End: 2024-04-14
  Filled 2024-04-14: qty 5

## 2024-04-14 MED ORDER — RUBIDIUM RB82 GENERATOR (RUBYFILL)
25.0000 | PACK | Freq: Once | INTRAVENOUS | Status: AC
Start: 2024-04-14 — End: 2024-04-14
  Administered 2024-04-14: 24.97 via INTRAVENOUS

## 2024-04-14 MED ORDER — RUBIDIUM RB82 GENERATOR (RUBYFILL)
25.0000 | PACK | Freq: Once | INTRAVENOUS | Status: AC
  Administered 2024-04-14: 24.95 via INTRAVENOUS

## 2024-04-15 ENCOUNTER — Telehealth: Payer: Self-pay | Admitting: Internal Medicine

## 2024-04-18 ENCOUNTER — Other Ambulatory Visit: Payer: Self-pay | Admitting: Family Medicine

## 2024-04-18 DIAGNOSIS — E790 Hyperuricemia without signs of inflammatory arthritis and tophaceous disease: Secondary | ICD-10-CM

## 2024-04-19 ENCOUNTER — Ambulatory Visit: Admitting: Cardiology

## 2024-04-19 NOTE — Telephone Encounter (Signed)
 Erroneous encounter

## 2024-04-20 ENCOUNTER — Encounter: Payer: Self-pay | Admitting: Internal Medicine

## 2024-04-20 ENCOUNTER — Ambulatory Visit: Attending: Internal Medicine | Admitting: Internal Medicine

## 2024-04-20 ENCOUNTER — Other Ambulatory Visit: Payer: Self-pay | Admitting: Family Medicine

## 2024-04-20 VITALS — BP 140/80 | HR 61 | Ht 75.0 in | Wt 208.8 lb

## 2024-04-20 DIAGNOSIS — I2510 Atherosclerotic heart disease of native coronary artery without angina pectoris: Secondary | ICD-10-CM | POA: Diagnosis not present

## 2024-04-20 DIAGNOSIS — E1169 Type 2 diabetes mellitus with other specified complication: Secondary | ICD-10-CM

## 2024-04-20 DIAGNOSIS — I10 Essential (primary) hypertension: Secondary | ICD-10-CM | POA: Diagnosis not present

## 2024-04-20 DIAGNOSIS — E785 Hyperlipidemia, unspecified: Secondary | ICD-10-CM

## 2024-04-20 DIAGNOSIS — R809 Proteinuria, unspecified: Secondary | ICD-10-CM

## 2024-04-20 MED ORDER — ISOSORBIDE MONONITRATE ER 30 MG PO TB24: 30.0000 mg | ORAL_TABLET | Freq: Every day | ORAL | 3 refills | Status: AC

## 2024-04-20 NOTE — Progress Notes (Signed)
 Cardiology Office Note:  .   Date:  04/20/2024  ID:  Stuart Henry, DOB 06-09-1941, MRN [id number] PCP: Gasper Nancyann BRAVO, MD  [facility] Providers Cardiologist:  Lonni Hanson, MD     History of Present Illness: .   Stuart Henry is a 83 y.o. male with history of coronary artery disease status post CABG (09/2016) complicated by postoperative atrial fibrillation, ischemic cardiomyopathy (LVEF normal by echo in 04/2017), type 2 diabetes mellitus, and hypertension, who presents for follow-up of coronary artery disease and abnormal stress test.  I last saw him in late August, at which time he complained of more fatigue, which had been his anginal equivalent in the past.  We agreed to pursue myocardial PET/CT, which showed a new reversible defect in the lateral wall.  Today, Stuart Henry feels about the same as at our last visit.  His only complaint is of some somnolence and fatigue, though he is still able to play golf without limitations.  He denies chest pain, shortness of breath, palpitations, lightheadedness, and edema.  He notes that his blood sugars continue to be better since starting insulin .  ROS: See HPI  Studies Reviewed: SABRA   EKG Interpretation Date/Time:  Wednesday April 20 2024 11:37:38 EDT Ventricular Rate:  61 PR Interval:  264 QRS Duration:  96 QT Interval:  438 QTC Calculation: 440 R Axis:   -33  Text Interpretation: Sinus rhythm with sinus arrhythmia with 1st degree A-V block Left axis deviation Nonspecific T wave abnormality Abnormal ECG When compared with ECG of 24-Mar-2024 09:19, Premature atrial complexes are no longer Present T wave inversion more evident in Lateral leads Confirmed by Alanta Scobey, Lonni [phone]) on 04/20/2024 2:13:37 PM    Myocardial PET/CT (04/14/2024): Intermediate risk study with a large in size, moderate in severity, reversible defect involving the lateral wall.  LVEF dropped from 56% at rest to 51% at stress.  Risk  Assessment/Calculations:     HYPERTENSION CONTROL Vitals:   04/20/24 1132 04/20/24 1210  BP: (!) 144/78 (!) 140/80    The patient's blood pressure is elevated above target today.  In order to address the patient's elevated BP: A new medication was prescribed today.          Physical Exam:   VS:  BP (!) 140/80 (BP Location: Left Arm, Cuff Size: Normal)   Pulse 61   Ht 6' 3 (1.905 m)   Wt 208 lb 12.8 oz (94.7 kg)   SpO2 98%   BMI 26.10 kg/m    Wt Readings from Last 3 Encounters:  04/20/24 208 lb 12.8 oz (94.7 kg)  04/01/24 213 lb 6.4 oz (96.8 kg)  03/24/24 208 lb (94.3 kg)    General:  NAD. Neck: No JVD or HJR. Lungs: Clear to auscultation bilaterally without wheezes or crackles. Heart: Regular rate and rhythm without murmurs, rubs, or gallops. Abdomen: Soft, nontender, nondistended. Extremities: No lower extremity edema.  ASSESSMENT AND PLAN: .    Coronary artery disease: Stuart Henry continues to deny chest pain and shortness of breath though he has stable somnolence and fatigue during the day.  It is not typical for coronary insufficiency, though fatigue was his anginal equivalent leading up to his stress test/catheterization in 2018 and subsequent CABG.  We reviewed the images from his myocardial perfusion PET/CT together, which demonstrated ischemia involving the lateral wall.  I suspect that he may have severe stenosis or occlusion of the sequential SVG to superior and inferior branches of ramus intermedius.  We discussed the role for catheterization versus medical therapy and have agreed to defer catheterization and instead initiate isosorbide  mononitrate 30 mg daily to see if this helps with his fatigue should it be is anginal equivalent.  Continue secondary prevention with aspirin  and rosuvastatin .  If fatigue does not improve and/or more typical angina develops, we will need to consider moving forward with catheterization.  Hypertension: Blood pressure mildly elevated  today.  Continue lisinopril  40 mg daily.  Add isosorbide  mononitrate 30 mg daily for antianginal therapy.  Hyperlipidemia associated with type 2 diabetes mellitus: Continue rosuvastatin  10 mg daily given statin myopathy at higher doses.  LDL well-controlled on last check in August at 93.  Ongoing management of DM per Dr. Gasper.    Dispo: Return to clinic in 2 months.  Signed, Lonni Hanson, MD

## 2024-04-20 NOTE — Patient Instructions (Signed)
 Medication Instructions:  Your physician recommends the following medication changes.   START TAKING: Isosorbide  Mononitrate 30 mg once daily  *If you need a refill on your cardiac medications before your next appointment, please call your pharmacy*  Lab Work: None ordered at this time  If you have labs (blood work) drawn today and your tests are completely normal, you will receive your results only by: MyChart Message (if you have MyChart) OR A paper copy in the mail If you have any lab test that is abnormal or we need to change your treatment, we will call you to review the results.  Testing/Procedures: None ordered at this time   Follow-Up: At [facility], you and your health needs are our priority.  As part of our continuing mission to provide you with exceptional heart care, our providers are all part of one team.  This team includes your primary Cardiologist (physician) and Advanced Practice Providers or APPs (Physician Assistants and Nurse Practitioners) who all work together to provide you with the care you need, when you need it.  Your next appointment:   3 month(s)  Provider:   You may see Lonni Hanson, MD or one of the following Advanced Practice Providers on your designated Care Team:   Lonni Meager, NP Lesley Maffucci, PA-C Bernardino Bring, PA-C Cadence [place], PA-C Tylene Lunch, NP Barnie Hila, NP    We recommend signing up for the patient portal called MyChart.  Sign up information is provided on this After Visit Summary.  MyChart is used to connect with patients for Virtual Visits (Telemedicine).  Patients are able to view lab/test results, encounter notes, upcoming appointments, etc.  Non-urgent messages can be sent to your provider as well.   To learn more about what you can do with MyChart, go to [email or web address].

## 2024-04-21 ENCOUNTER — Other Ambulatory Visit: Payer: Self-pay

## 2024-04-21 MED ORDER — FREESTYLE LIBRE 3 PLUS SENSOR MISC: 3 refills | Status: DC

## 2024-04-29 ENCOUNTER — Other Ambulatory Visit: Payer: Self-pay | Admitting: Pharmacist

## 2024-04-29 DIAGNOSIS — R809 Proteinuria, unspecified: Secondary | ICD-10-CM

## 2024-04-29 MED ORDER — FREESTYLE LIBRE 3 READER DEVI: 0 refills | Status: DC

## 2024-04-29 MED ORDER — ACCU-CHEK GUIDE W/DEVICE KIT: PACK | 0 refills | Status: AC

## 2024-04-29 MED ORDER — ACCU-CHEK GUIDE TEST VI STRP: ORAL_STRIP | 12 refills | Status: AC

## 2024-04-29 NOTE — Progress Notes (Signed)
 04/29/2024 Name: Stuart Henry MRN: [id number] DOB: 1940-11-28  Chief Complaint  Patient presents with   Diabetes    Stuart Henry is a 83 y.o. year old male who presented for a telephone visit.   They were referred to the pharmacist by their PCP for assistance in managing diabetes.   Referred for PAP assistance with Ozempic - patient does not qualify  Subjective:  Care Team: Primary Care Provider: Gasper Nancyann BRAVO, MD ; Next Scheduled Visit: 06/03/24 Clinical Pharmacist: Aloysius Lewis, PharmD  Medication Access/Adherence  Current Pharmacy:  [facility] [address] [place] - [address] [place] [address] Phone: [phone] Fax: [phone]  [facility]) - [place], [place] - [address] [facility] [address] [place] [address] [place] [address] Phone: [phone] Fax: [phone]  [facility] - [place], [place] - [address] [place] [address] Phone: [phone] Fax: [phone]   Patient reports affordability concerns with their medications: No  Patient reports access/transportation concerns to their pharmacy: No  Patient reports adherence concerns with their medications:  No     Diabetes:  Current medications: Farxiga  10mg  daily, Glipizide  IR 5mg  twice a day with meals, Metformin  1000mg  twice a day, Pioglitazone  30mg  daily, Lantus  26U daily in the morning Medications tried in the past: Ozempic  (too expensive)  Readings from 9/26 visit: Mostly 100's, highest 150, 128 this morning  Readings from 8/18 visit:   Fasting:  129, 146, 146, 156, 143, 164, 116, 103, 116  Post-prandial:  AM: 159, 161, 156 PM: 233, 232  Post-prandial glucose readings from 8/4 visit:  248, 209, 161 (best), 350 (worst), 286, 269  Current glucose readings from 7/14 visit: 5 days: <120 3 days: 130's 3 days: 150's 3 days: 160+ (highest 174) 1 low of 82 at  nighttime- felt shakey Average: 136  Current glucose readings from 6/30 visit:  120's: 3 days (129, 128, 126) 130's: 2 days (132, 137) 140's: 140, 142, 144 150's: 152, 157, 152 160+: 175 ([employer]), 163, 166 Current glucose readings from 6/16 visit: 140's at each check Current glucose readings from 6/6 visit: 173, 201, 162 Current glucose readings from 6/3 visit: 196 (31st ate wrong), 181, 158, 156 Current glucose readings from 5/30 visit: 166 today (no insulin ); reports usually at 180's Using Accu-Chek Aviva Plus meter; testing 1 times daily   Patient denies hypoglycemic s/sx including dizziness, shakiness, sweating. Patient denies hyperglycemic symptoms including polyuria, polydipsia, polyphagia, nocturia, neuropathy, blurred vision.  Current physical activity: Bikes 10 miles  Breakfast: [place] biscuit + piece of sausage Lunch: Half a salad, bologne sandwich on wheat bread Dinner (6PM): Cold plate (chicken salad, lettuce, tomato, cucumber, pears) + small portion of pasta salad, fried fish + slaw + hushpuppies  Current medication access support: [facility] Medicare Advantage   Objective:  Lab Results  Component Value Date   HGBA1C 8.9 (A) 04/01/2024    Lab Results  Component Value Date   CREATININE 1.37 (H) 03/24/2024   BUN 22 03/24/2024   NA 140 03/24/2024   K 5.0 03/24/2024   CL 102 03/24/2024   CO2 21 03/24/2024    Lab Results  Component Value Date   CHOL 106 03/24/2024   HDL 33 (L) 03/24/2024   LDLCALC 47 03/24/2024   TRIG 148 03/24/2024   CHOLHDL 3.2 03/24/2024    Medications Reviewed Today   Medications were not reviewed in this encounter  Assessment/Plan:   Diabetes: - Currently uncontrolled - Reviewed long term cardiovascular and renal outcomes of uncontrolled blood sugar - Reviewed goal A1c, goal fasting, and goal 2 hour post prandial glucose - Recommend to check glucose daily   Follow Up Plan:  - Follow-up visit on 05/18/24 for  [place] set-up in office  - Continue Lantus  26U daily in the morning  - Continue Glipizide  IR 5mg  twice a day prior to meals - Continue checking 2 hours after meals instead- goal is <180  - Recent A1c on 8/29 (8.9%) will reflect 3 month A1c on insulin , but only 1.5 months with BG's near at goal - BG's are at goal if eating appropriately the night prior- goal is to focus on diet currently  - Eats peanut butter sandwiches late at night occasionally  - Sending new testing supplies (new meter + strips; new Libre 3 plus + reader)   - Current fatigue- has started on Levothyroxine  and Imdur      Aloysius Lewis, PharmD [facility]

## 2024-05-02 ENCOUNTER — Other Ambulatory Visit: Payer: Self-pay | Admitting: Internal Medicine

## 2024-05-13 ENCOUNTER — Other Ambulatory Visit: Payer: Self-pay | Admitting: Family Medicine

## 2024-05-13 DIAGNOSIS — R809 Proteinuria, unspecified: Secondary | ICD-10-CM

## 2024-05-18 ENCOUNTER — Other Ambulatory Visit (HOSPITAL_COMMUNITY): Payer: Self-pay

## 2024-05-18 ENCOUNTER — Ambulatory Visit

## 2024-05-18 DIAGNOSIS — R809 Proteinuria, unspecified: Secondary | ICD-10-CM | POA: Diagnosis not present

## 2024-05-18 DIAGNOSIS — E1129 Type 2 diabetes mellitus with other diabetic kidney complication: Secondary | ICD-10-CM | POA: Diagnosis not present

## 2024-05-18 DIAGNOSIS — Z7984 Long term (current) use of oral hypoglycemic drugs: Secondary | ICD-10-CM

## 2024-05-18 DIAGNOSIS — Z794 Long term (current) use of insulin: Secondary | ICD-10-CM | POA: Diagnosis not present

## 2024-05-18 MED ORDER — JANUMET XR 50-1000 MG PO TB24: 1.0000 | ORAL_TABLET | Freq: Two times a day (BID) | ORAL | 1 refills | Status: DC

## 2024-05-18 NOTE — Progress Notes (Signed)
 S:     Chief Complaint  Patient presents with   Medication Management    Reason for visit: ?  Stuart Henry is a 83 y.o. male with a history of diabetes (type 2), who presents today for a follow up diabetes Face to Face pharmacotherapy visit.? Pertinent PMH also includes gout, CAD s/p CABG x4, Afib, HTN, HFimpEF.  Care Team: Primary Care Provider: Gasper Nancyann BRAVO, MD  Today, patient reports concerns for recurrent UTI and genitourinary/yeast infections since he has been started on Farxiga . Additionally, he reports he has not been taking pioglitazone  for some time, Unable to find any fill history for this medication since 2022. Patient reports using CGM, however, it fell off immediately after placement. He is not interested in restarting at this time.   Current diabetes medications include: Farxiga  10 mg daily, glipizide  5 mg BID, Lantus  26 units daily, metformin  1000 mg BID, pioglitazone  30 mg daily (not taking) Previous diabetes medications include: Ozempic  (cost) Current hypertension medications include: lisinopril  40 mg daily Current hyperlipidemia medications include: rosuvastatin  10 mg daily  Patient reports adherence to taking all medications as prescribed, aside from pioglitazone   Have you been experiencing any side effects to the medications prescribed? Yes - GU infections with Farxiga  Do you have any problems obtaining medications due to transportation or finances? Yes - brand name products, however, does not qualify for PAP based on income Insurance coverage: [facility] Medicare Advantage  Patient denies hypoglycemic events.  Reported home fasting blood sugars: 110-130 mg/dL  Patient reported dietary habits: Eats 3 meals/day Breakfast: blueberries, homemade sausage biscuit, coffee  Lunch: sandwich, hot dog, 12 french fries  Dinner: casseroles  Patient-reported exercise habits: bikes 5-10 miles DM Prevention:  Statin: Taking; moderate intensity.?  History of chronic  kidney disease? yes History of albuminuria? yes, last UACR on 04/27/23 = 30 mg/g Last eye exam: Lab Results  Component Value Date   HMDIABEYEEXA No Retinopathy 11/11/2023   Tobacco Use:  Tobacco Use: Medium Risk (04/20/2024)   Patient History    Smoking Tobacco Use: Former    Smokeless Tobacco Use: Never    Passive Exposure: Not on file   O:  Vitals:  Wt Readings from Last 3 Encounters:  04/20/24 208 lb 12.8 oz (94.7 kg)  04/01/24 213 lb 6.4 oz (96.8 kg)  03/24/24 208 lb (94.3 kg)   BP Readings from Last 3 Encounters:  04/20/24 (!) 140/80  04/14/24 (!) 124/56  04/01/24 (!) 140/81   Pulse Readings from Last 3 Encounters:  04/20/24 61  04/14/24 61  04/01/24 (!) 55     Labs:?  Lab Results  Component Value Date   HGBA1C 8.9 (A) 04/01/2024   HGBA1C 9.9 (A) 11/30/2023   HGBA1C 9.0 (A) 08/31/2023   GLUCOSE 166 (H) 03/24/2024   MICRALBCREAT 30 (H) 04/27/2023   MICRALBCREAT 50 (H) 11/22/2021   MICRALBCREAT n/a 11/30/2015   CREATININE 1.37 (H) 03/24/2024   CREATININE 1.08 04/27/2023   CREATININE 1.13 12/19/2022    Lab Results  Component Value Date   CHOL 106 03/24/2024   LDLCALC 47 03/24/2024   LDLCALC 30 12/19/2022   LDLCALC 37 11/22/2021   HDL 33 (L) 03/24/2024   TRIG 148 03/24/2024   TRIG 156 (H) 12/19/2022   TRIG 84 11/22/2021   ALT 16 03/24/2024   ALT 24 12/19/2022   AST 13 03/24/2024   AST 20 12/19/2022      Chemistry      Component Value Date/Time   NA 140 03/24/2024  1011   K 5.0 03/24/2024 1011   CL 102 03/24/2024 1011   CO2 21 03/24/2024 1011   BUN 22 03/24/2024 1011   CREATININE 1.37 (H) 03/24/2024 1011   GLU 155 03/10/2014 0000      Component Value Date/Time   CALCIUM  9.7 03/24/2024 1011   ALKPHOS 42 (L) 03/24/2024 1011   AST 13 03/24/2024 1011   ALT 16 03/24/2024 1011   BILITOT 0.6 03/24/2024 1011       The ASCVD Risk score (Arnett DK, et al., 2019) failed to calculate for the following reasons:   The 2019 ASCVD risk score is only  valid for ages 42 to 71  Lab Results  Component Value Date   MICRALBCREAT 30 (H) 04/27/2023   MICRALBCREAT 50 (H) 11/22/2021   MICRALBCREAT n/a 11/30/2015    A/P: Diabetes currently uncontrolled with a most recent A1c of 8.9% on 04/01/24, which is down from 9.9% on 11/30/23. Patient is able to verbalize appropriate hypoglycemia management plan. Reported home fasting BG readings are at goal. Medication adherence appears appropriate, however, he has not been taking pioglitazone  for a number of years. Given concurrent CHF dx, will discontinue at this time. Patient reports recurrent GU infections since initiation of Farxiga . Will discontinue at this time. Could consider reinitation as BG continues to improve. Discussed options such as increasing insulin  dose or starting a combination DPP4i/metformin  in place of his metformin  therapy. Patient would prefer starting a DPP4i at this time -Continued basal insulin  Lantus  (insulin  glargine)  26 units daily.  -Discontinued SGLT2-I Farxiga  (dapagliflozin ) -Discontinued metformin  -Initiated Janumet XR 50/1000 mg BID -Patient educated on purpose, proper use, and potential adverse effects of Janumet.  -Extensively discussed pathophysiology of diabetes, recommended lifestyle interventions, dietary effects on blood sugar control.  -Counseled on s/sx of and management of hypoglycemia.  -Next A1c anticipated 07/2024.   ASCVD risk - secondary prevention in patient with diabetes. Last LDL is 47 mg/dL, at goal of <44 mg/dL. -Continued rosuvastatin  10 mg daily.   Patient verbalized understanding of treatment plan. Total time patient counseling 30 minutes.  Follow-up:  Pharmacist on 06/15/24 PCP clinic visit on 07/04/24  Peyton CHARLENA Ferries, PharmD Clinical Pharmacist [facility] [phone]

## 2024-05-27 ENCOUNTER — Ambulatory Visit: Payer: Self-pay

## 2024-05-27 NOTE — Telephone Encounter (Signed)
 FYI Only or Action Required?: Action required by provider: clinical question for provider.  Patient was last seen in primary care on 04/01/2024 by Gasper Nancyann BRAVO, MD.  Called Nurse Triage reporting Blood Sugar Problem.  Symptoms began 1-2 months.  Interventions attempted: Prescription medications: insulin  and metformin  and Dietary changes.  Symptoms are: stable.  Triage Disposition: Home Care  Patient/caregiver understands and will follow disposition?: Yes                           Copied from CRM #[id number]. Topic: Clinical - Medication Question >> May 27, 2024  9:00 AM Emylou G wrote: Reason for CRM: patient called said he stopped Farxiga  - sugar has gone up ( more than expected ) - it was removed from his list and stopped 10/15 .. sugar this morning was 176 and yesterday 188 and wednesday 162 ( saturday 125 was his sugar before he stopped ) it had been causing side effects why he stopped.. doctor was aware at the time it was okay..  -- Pls call him.. him will be at home til 11:30a.SABRA  after 11 if home doesn't answer try cell Reason for Disposition  Blood glucose 70-240 mg/dL (3.9 -86.6 mmol/L)  Answer Assessment - Initial Assessment Questions 1. BLOOD GLUCOSE: What is your blood glucose level?      176 today, 188 yesterday, 162 on Wednesday  2. ONSET: When did you check the blood glucose?     Today and past few days  3. USUAL RANGE: What is your glucose level usually? (e.g., usual fasting morning value, usual evening value)     110s-120s 4. KETONES: Do you check for ketones (urine or blood test strips)? If Yes, ask: What does the test show now?      N/A 5. TYPE 1 or 2:  Do you know what type of diabetes you have?  (e.g., Type 1, Type 2, Gestational; doesn't know)      Type 2, per chart 6. INSULIN : Do you take insulin ? What type of insulin (s) do you use? What is the mode of delivery? (syringe, pen; injection or pump)?      States he started  insulin  about 2 months ago, states he takes 26 units 7. DIABETES PILLS: Do you take any pills for your diabetes? If Yes, ask: Have you missed taking any pills recently?     States he takes a metformin  combination pill 8. OTHER SYMPTOMS: Do you have any symptoms? (e.g., fever, frequent urination, difficulty breathing, dizziness, weakness, vomiting)     Weight gain (15 lbs in past 2 months), fatigue, frequent urination at baseline, denies any new/additional symptoms since stopping Farxiga  9. PREGNANCY: Is there any chance you are pregnant? When was your last menstrual period?     N/A    Patient stated he stopped taking Farxiga  due to cost and side effects. Patient stated he has noticed an increase in blood glucose, since stopping Farxiga . Patient would like PCP's or pharmacist's input on whether or not further medication changes are necessary. Patient is requesting office to leave a message with detailed instructions, if he cannot answer his phone at time of call. Please advise.  This RN advised home care at this time. Patient understands to monitor symptoms and report if symptoms worsen.  Protocols used: Diabetes - High Blood Sugar-A-AH

## 2024-05-29 NOTE — Telephone Encounter (Signed)
 He increase his lantus  insulin  by 4 units a day

## 2024-05-30 NOTE — Telephone Encounter (Signed)
 NA, only ringing tone. Ok for [employer] to [employer].

## 2024-05-31 NOTE — Telephone Encounter (Signed)
 LMTCB

## 2024-06-01 ENCOUNTER — Telehealth: Payer: Self-pay

## 2024-06-01 NOTE — Telephone Encounter (Signed)
 Copied from CRM #[id number]. Topic: General - Other >> Jun 01, 2024  8:19 AM Myrick T wrote: Reason for CRM: patient called stated he was advised of Dr Lyle message to increase his insulin . Patient said he recvd a call from Joseline and if there was another message for him she could call him back but if not he was fine.

## 2024-06-01 NOTE — Telephone Encounter (Signed)
Patient advised. See other telephone encounter

## 2024-06-01 NOTE — Telephone Encounter (Signed)
 Noted. Thanks.

## 2024-06-03 ENCOUNTER — Ambulatory Visit: Admitting: Family Medicine

## 2024-06-03 DIAGNOSIS — E1129 Type 2 diabetes mellitus with other diabetic kidney complication: Secondary | ICD-10-CM

## 2024-06-03 DIAGNOSIS — R7989 Other specified abnormal findings of blood chemistry: Secondary | ICD-10-CM

## 2024-06-15 ENCOUNTER — Ambulatory Visit: Attending: Internal Medicine | Admitting: Internal Medicine

## 2024-06-15 ENCOUNTER — Ambulatory Visit (INDEPENDENT_AMBULATORY_CARE_PROVIDER_SITE_OTHER)

## 2024-06-15 ENCOUNTER — Encounter: Payer: Self-pay | Admitting: Internal Medicine

## 2024-06-15 VITALS — BP 140/80 | HR 57 | Ht 76.0 in | Wt 219.8 lb

## 2024-06-15 DIAGNOSIS — E1129 Type 2 diabetes mellitus with other diabetic kidney complication: Secondary | ICD-10-CM

## 2024-06-15 DIAGNOSIS — E1169 Type 2 diabetes mellitus with other specified complication: Secondary | ICD-10-CM

## 2024-06-15 DIAGNOSIS — I25119 Atherosclerotic heart disease of native coronary artery with unspecified angina pectoris: Secondary | ICD-10-CM

## 2024-06-15 DIAGNOSIS — R809 Proteinuria, unspecified: Secondary | ICD-10-CM

## 2024-06-15 DIAGNOSIS — E785 Hyperlipidemia, unspecified: Secondary | ICD-10-CM

## 2024-06-15 DIAGNOSIS — I441 Atrioventricular block, second degree: Secondary | ICD-10-CM | POA: Diagnosis not present

## 2024-06-15 DIAGNOSIS — I10 Essential (primary) hypertension: Secondary | ICD-10-CM

## 2024-06-15 DIAGNOSIS — Z794 Long term (current) use of insulin: Secondary | ICD-10-CM

## 2024-06-15 DIAGNOSIS — I2510 Atherosclerotic heart disease of native coronary artery without angina pectoris: Secondary | ICD-10-CM

## 2024-06-15 MED ORDER — LANTUS SOLOSTAR 100 UNIT/ML ~~LOC~~ SOPN: PEN_INJECTOR | SUBCUTANEOUS | 5 refills | Status: DC

## 2024-06-15 NOTE — Progress Notes (Signed)
 S:     Reason for visit: ?  Stuart Henry is a 83 y.o. male with a history of diabetes (type 2), who presents today for a follow up diabetes Face to Face pharmacotherapy visit.? Pertinent PMH also includes gout, CAD s/p CABG x4, Afib, HTN, HFimpEF.  Care Team: Primary Care Provider: Gasper Nancyann BRAVO, MD  At last visit with clinical pharmacist on 05/18/24, patient reported concerns for recurrent UTI and genitourinary/yeast infections since he has been started on Farxiga . He also reported he had not been taking pioglitazone  for some time with no fill hx of this medication since 2022. Farxiga  and pioglitazone  were discontinued. Patient was switched from metformin  to Janumet XR 50/1000 mg.  In the interim, he reported elevated fasting BG readings. Lantus  was increased to 30 units.  Today, he reports an improvement in GU symptoms since stopping Farxiga .  Current diabetes medications include: glipizide  5 mg BID, Lantus  30 units daily, Janumet 50/1000 mg BID Previous diabetes medications include: Ozempic  (cost), Farxiga  (UTI), pioglitazone  Current hypertension medications include: lisinopril  40 mg daily Current hyperlipidemia medications include: rosuvastatin  10 mg daily  Patient reports adherence to taking all medications as prescribed, aside from pioglitazone   Have you been experiencing any side effects to the medications prescribed? Yes - GU infections with Farxiga  Do you have any problems obtaining medications due to transportation or finances? Yes - brand name products, however, does not qualify for PAP based on income Insurance coverage: [facility] Medicare Advantage  Patient denies hypoglycemic events.  Reported home fasting blood sugars: 125-180 mg/dL  Patient reported dietary habits: Eats 3 meals/day Breakfast: blueberries, homemade sausage biscuit, coffee  Lunch: sandwich, hot dog, 12 french fries  Dinner: casseroles  Patient-reported exercise habits: bikes 5-10 miles DM  Prevention:  Statin: Taking; moderate intensity.?  History of chronic kidney disease? yes History of albuminuria? yes Last eye exam: Lab Results  Component Value Date   HMDIABEYEEXA No Retinopathy 11/11/2023   Tobacco Use:  Tobacco Use: Medium Risk (04/20/2024)   Patient History    Smoking Tobacco Use: Former    Smokeless Tobacco Use: Never    Passive Exposure: Not on file   O:  Vitals:  Wt Readings from Last 3 Encounters:  04/20/24 208 lb 12.8 oz (94.7 kg)  04/01/24 213 lb 6.4 oz (96.8 kg)  03/24/24 208 lb (94.3 kg)   BP Readings from Last 3 Encounters:  04/20/24 (!) 140/80  04/14/24 (!) 124/56  04/01/24 (!) 140/81   Pulse Readings from Last 3 Encounters:  04/20/24 61  04/14/24 61  04/01/24 (!) 55     Labs:?  Lab Results  Component Value Date   HGBA1C 8.9 (A) 04/01/2024   HGBA1C 9.9 (A) 11/30/2023   HGBA1C 9.0 (A) 08/31/2023   GLUCOSE 166 (H) 03/24/2024   MICRALBCREAT 30 (H) 04/27/2023   MICRALBCREAT 50 (H) 11/22/2021   MICRALBCREAT n/a 11/30/2015   CREATININE 1.37 (H) 03/24/2024   CREATININE 1.08 04/27/2023   CREATININE 1.13 12/19/2022    Lab Results  Component Value Date   CHOL 106 03/24/2024   LDLCALC 47 03/24/2024   LDLCALC 30 12/19/2022   LDLCALC 37 11/22/2021   HDL 33 (L) 03/24/2024   TRIG 148 03/24/2024   TRIG 156 (H) 12/19/2022   TRIG 84 11/22/2021   ALT 16 03/24/2024   ALT 24 12/19/2022   AST 13 03/24/2024   AST 20 12/19/2022      Chemistry      Component Value Date/Time   NA 140 03/24/2024  1011   K 5.0 03/24/2024 1011   CL 102 03/24/2024 1011   CO2 21 03/24/2024 1011   BUN 22 03/24/2024 1011   CREATININE 1.37 (H) 03/24/2024 1011   GLU 155 03/10/2014 0000      Component Value Date/Time   CALCIUM  9.7 03/24/2024 1011   ALKPHOS 42 (L) 03/24/2024 1011   AST 13 03/24/2024 1011   ALT 16 03/24/2024 1011   BILITOT 0.6 03/24/2024 1011       The ASCVD Risk score (Arnett DK, et al., 2019) failed to calculate for the following  reasons:   The 2019 ASCVD risk score is only valid for ages 24 to 68  Lab Results  Component Value Date   MICRALBCREAT 30 (H) 04/27/2023   MICRALBCREAT 50 (H) 11/22/2021   MICRALBCREAT n/a 11/30/2015    A/P: Diabetes currently uncontrolled with a most recent A1c of 8.9% on 04/01/24, which is down from 9.9% on 11/30/23. Patient is able to verbalize appropriate hypoglycemia management plan. Reported home fasting BG readings are at goal. 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 Fasting BG remains above goal around 125-180 mg/dL. Will increase dose at this time.  -Increased dose of basal insulin  Lantus  (insulin  glargine)  to 34 units daily. Can increase by 2 units every 2 days for fasting BG >150 mg/dL until 44 units daily is reached.  -Continued Janumet XR 50/1000 mg BID -Patient educated on purpose, proper use, and potential adverse effects of Janumet.  -Extensively discussed pathophysiology of diabetes, recommended lifestyle interventions, dietary effects on blood sugar control.  -Counseled on s/sx of and management of hypoglycemia.  -Next A1c anticipated 07/2024.   ASCVD risk - secondary prevention in patient with diabetes. Last LDL is 47 mg/dL, at goal of <44 mg/dL. -Continued rosuvastatin  10 mg daily.   Patient verbalized understanding of treatment plan. Total time patient counseling 30 minutes.  Follow-up:  Pharmacist on 08/17/24 PCP clinic visit on 07/04/24  Peyton CHARLENA Ferries, PharmD Clinical Pharmacist [facility] [phone]

## 2024-06-15 NOTE — Progress Notes (Signed)
 Cardiology Office Note:  .   Date:  06/16/2024  ID:  Stuart Henry, DOB 12/01/1940, MRN [id number] PCP: Stuart Nancyann BRAVO, MD  [facility] Providers Cardiologist:  Lonni Hanson, MD     History of Present Illness: .   Stuart Henry is a 83 y.o. male with history of coronary artery disease status post CABG (09/2016) complicated by postoperative atrial fibrillation, ischemic cardiomyopathy (LVEF normal by echo in 04/2017), type 2 diabetes mellitus, and hypertension, who presents for follow-up of coronary artery disease.  I last saw him in September for follow-up of fatigue, which had been his anginal equivalent in the past.  We have performed a myocardial PET/CT stress test before our visit, which showed a new reversible defect in the lateral wall.  We discussed catheterization versus continued medical therapy with close follow-up, agreeing to the latter.  We therefore added isosorbide  mononitrate 30 mg daily and continued secondary prevention with aspirin  and rosuvastatin .  Today, [name] reports that he feels about the same as at our last visit.  He continues to feel tired and weak.  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  He is not exercising regularly because he is afraid to do so on account of his abnormal PET scan and concern about a blockage in one of his bypasses.  He has not had any frank chest pain, though question some mild chest pressure when he lies down at night.  He denies significant dyspnea as well as palpitations, lightheadedness, and edema.  He notes that over the last month, his blood sugars have been less well-controlled after he was switched from Farxiga  to Janumet.  He is scheduled to follow-up with his diabetes team later today.  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 LVEF dropped from 56% at rest to 51% at stress.   Risk Assessment/Calculations:         Physical Exam:   VS:  BP (!) 140/80 (BP Location: Left Arm, Patient Position: Sitting, Cuff Size: Normal)   Pulse (!) 57 Comment: 58 oximeter  Ht 6' 4 (1.93 m)   Wt 219 lb 12.8 oz (99.7 kg)   SpO2 99%   BMI 26.75 kg/m    Wt Readings from Last 3 Encounters:  06/15/24 219 lb 12.8 oz (99.7 kg)  04/20/24 208 lb 12.8 oz (94.7 kg)  04/01/24 213 lb 6.4 oz (96.8 kg)    Heart rate increases from 60 to 80 bpm with ambulation in the office  General:  NAD. Neck: No JVD or HJR. Lungs: Clear to auscultation bilaterally without wheezes or crackles. Heart: Regular rate and rhythm with occasional pauses.  No murmurs. Abdomen: Soft, nontender, nondistended. Extremities: No lower extremity edema.  ASSESSMENT AND PLAN: .    Coronary artery disease with anginal equivalent: [name] denies frank  chest pain though he continues to be fatigued and now notes some vague heaviness in his upper chest when he lies down at night.  This was his anginal equivalent leading up to bypass.  He has not noticed any improvement with addition of isosorbide  mononitrate.  He is also afraid to exercise or do other activities because of his history of CAD and abnormal stress test.  In light of this, we have agreed to move forward with cardiac catheterization and possible PCI.  In the meantime, we will continue his current regimen  of aspirin , rosuvastatin , and isosorbide  mononitrate.  We will check a CBC and BMP today in anticipation of catheterization.  Mobitz type I second-degree AV block: This is a new finding, with the EKGs previously having shown only first-degree AV block.  Mr. Kauth is not on any AV nodal blocking agents.  Most recent TSH in August was borderline elevated at 4.8.  Question if ischemia is contributing to this.  As above, we will proceed with catheterization.  AV nodal blocking agents should continue to be avoided.  No urgent EP intervention is indicated, as [name] does not have any worrisome symptoms (though some of his fatigue could be due to lower heart rates).  He demonstrates a normal chronotropic response to exercise.  Could consider ambulatory cardiac monitoring and/or GXT to formally evaluate his heart rate following catheterization.  Hyperlipidemia associated with type 2 diabetes mellitus: Lipids very well-controlled on the last check in 03/2024 with an LDL of 47.  Continue rosuvastatin  10 mg daily.  Ongoing management of DM per Dr. Gasper and his diabetes management team.  Essential hypertension: Blood pressure mildly elevated today, potentially exacerbated by stress.  Continue current regimen of lisinopril  and isosorbide  mononitrate.  Further recommendations to be made following catheterization.    Informed Consent   Shared Decision Making/Informed Consent The risks [stroke (1 in 1000), death (1 in 1000), kidney failure [usually temporary] (1 in 500), bleeding (1 in 200), allergic reaction [possibly serious] (1 in 200)], benefits (diagnostic support and management of coronary artery disease) and alternatives of a cardiac catheterization were discussed in detail with [name] and he is willing to proceed.     Dispo: Return to clinic in 3 weeks.  Signed, Lonni Hanson, MD

## 2024-06-15 NOTE — Patient Instructions (Addendum)
 Medication Instructions:  Your physician recommends that you continue on your current medications as directed. Please refer to the Current Medication list given to you today.    *If you need a refill on your cardiac medications before your next appointment, please call your pharmacy*  Lab Work: Your provider would like for you to have following labs drawn today CBC, BMP.     Testing/Procedures:  [place] [facility] [facility] [place] [place] [address] [place] [address] Dept: [phone] Loc: [phone]  BLAYTON HUTTNER  06/15/2024  You are scheduled for a Cardiac Catheterization on Tuesday, November 18 with Dr. Lonni End.  1. Please arrive at the [facility] Entrance of [facility], [address], [place] [address] at 8:30 AM (This is 1 hour(s) prior to your procedure time).  Proceed to the Check-In Desk directly inside the entrance.  Procedure Parking: Use the entrance off of the [address] side of the hospital. Turn right upon entering and follow the driveway to parking that is directly in front of the [facility]. There is no valet parking available at this entrance, however there is an awning directly in front of the [facility]ascular Center for drop off/ pick up for patients.  Special note: Every effort is made to have your procedure done on time. Please understand that emergencies sometimes delay scheduled procedures.  2. Diet: Nothing to eat after midnight.   3. Hydration: You need to be well hydrated before your procedure. On November 18, you may drink approved liquids (see below) until 2 hours before the procedure, with 16 oz of water as your last intake.   List of approved liquids water, clear juice, clear tea, black coffee, fruit juices, non-citric and without pulp, carbonated beverages, Gatorade, Kool -Aid, plain Jello-O and plain ice popsicles.  4.  Labs: You will need to have blood drawn today (CBC, BMP)  5. Medication instructions in preparation for your procedure:   Contrast Allergy: No   Please hold the morning of procedure  Glipizide  Lantus  Janumet  Hold 48 hours after procedure Janumet   On the morning of your procedure, take your Aspirin  81 mg and any morning medicines NOT listed above.  You may use sips of water.  6. Plan to go home the same day, you will only stay overnight if medically necessary. 7. Bring a current list of your medications and current insurance cards. 8. You MUST have a responsible person to drive you home. 9. Someone MUST be with you the first 24 hours after you arrive home or your discharge will be delayed. 10. Please wear clothes that are easy to get on and off and wear slip-on shoes.  Thank you for allowing us  to care for you!   -- [facility]   Follow-Up: At [facility], you and your health needs are our priority.  As part of our continuing mission to provide you with exceptional heart care, our providers are all part of one team.  This team includes your primary Cardiologist (physician) and Advanced Practice Providers or APPs (Physician Assistants and Nurse Practitioners) who all work together to provide you with the care you need, when you need it.  Your next appointment:   3 week(s)  Provider:   You may see Lonni Hanson, MD or one of the following Advanced Practice Providers on your designated Care Team:   Lonni  Vivienne, NP Lesley Maffucci, PA-C Bernardino Bring, PA-C Cadence Franchester, PA-C Tylene Lunch, NP Barnie Hila, NP

## 2024-06-15 NOTE — H&P (View-Only) (Signed)
 Cardiology Office Note:  .   Date:  06/16/2024  ID:  Stuart Henry, DOB 12/01/1940, MRN [id number] PCP: Gasper Nancyann BRAVO, MD  [facility] Providers Cardiologist:  Lonni Hanson, MD     History of Present Illness: .   Stuart Henry is a 83 y.o. male with history of coronary artery disease status post CABG (09/2016) complicated by postoperative atrial fibrillation, ischemic cardiomyopathy (LVEF normal by echo in 04/2017), type 2 diabetes mellitus, and hypertension, who presents for follow-up of coronary artery disease.  I last saw him in September for follow-up of fatigue, which had been his anginal equivalent in the past.  We have performed a myocardial PET/CT stress test before our visit, which showed a new reversible defect in the lateral wall.  We discussed catheterization versus continued medical therapy with close follow-up, agreeing to the latter.  We therefore added isosorbide  mononitrate 30 mg daily and continued secondary prevention with aspirin  and rosuvastatin .  Today, Stuart Henry reports that he feels about the same as at our last visit.  He continues to feel tired and weak.  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  He is not exercising regularly because he is afraid to do so on account of his abnormal PET scan and concern about a blockage in one of his bypasses.  He has not had any frank chest pain, though question some mild chest pressure when he lies down at night.  He denies significant dyspnea as well as palpitations, lightheadedness, and edema.  He notes that over the last month, his blood sugars have been less well-controlled after he was switched from Farxiga  to Janumet.  He is scheduled to follow-up with his diabetes team later today.  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 LVEF dropped from 56% at rest to 51% at stress.   Risk Assessment/Calculations:         Physical Exam:   VS:  BP (!) 140/80 (BP Location: Left Arm, Patient Position: Sitting, Cuff Size: Normal)   Pulse (!) 57 Comment: 58 oximeter  Ht 6' 4 (1.93 m)   Wt 219 lb 12.8 oz (99.7 kg)   SpO2 99%   BMI 26.75 kg/m    Wt Readings from Last 3 Encounters:  06/15/24 219 lb 12.8 oz (99.7 kg)  04/20/24 208 lb 12.8 oz (94.7 kg)  04/01/24 213 lb 6.4 oz (96.8 kg)    Heart rate increases from 60 to 80 bpm with ambulation in the office  General:  NAD. Neck: No JVD or HJR. Lungs: Clear to auscultation bilaterally without wheezes or crackles. Heart: Regular rate and rhythm with occasional pauses.  No murmurs. Abdomen: Soft, nontender, nondistended. Extremities: No lower extremity edema.  ASSESSMENT AND PLAN: .    Coronary artery disease with anginal equivalent: Stuart Henry denies frank  chest pain though he continues to be fatigued and now notes some vague heaviness in his upper chest when he lies down at night.  This was his anginal equivalent leading up to bypass.  He has not noticed any improvement with addition of isosorbide  mononitrate.  He is also afraid to exercise or do other activities because of his history of CAD and abnormal stress test.  In light of this, we have agreed to move forward with cardiac catheterization and possible PCI.  In the meantime, we will continue his current regimen  of aspirin , rosuvastatin , and isosorbide  mononitrate.  We will check a CBC and BMP today in anticipation of catheterization.  Mobitz type I second-degree AV block: This is a new finding, with the EKGs previously having shown only first-degree AV block.  Stuart Henry is not on any AV nodal blocking agents.  Most recent TSH in August was borderline elevated at 4.8.  Question if ischemia is contributing to this.  As above, we will proceed with catheterization.  AV nodal blocking agents should continue to be avoided.  No urgent EP intervention is indicated, as Stuart Henry does not have any worrisome symptoms (though some of his fatigue could be due to lower heart rates).  He demonstrates a normal chronotropic response to exercise.  Could consider ambulatory cardiac monitoring and/or GXT to formally evaluate his heart rate following catheterization.  Hyperlipidemia associated with type 2 diabetes mellitus: Lipids very well-controlled on the last check in 03/2024 with an LDL of 47.  Continue rosuvastatin  10 mg daily.  Ongoing management of DM per Dr. Gasper and his diabetes management team.  Essential hypertension: Blood pressure mildly elevated today, potentially exacerbated by stress.  Continue current regimen of lisinopril  and isosorbide  mononitrate.  Further recommendations to be made following catheterization.    Informed Consent   Shared Decision Making/Informed Consent The risks [stroke (1 in 1000), death (1 in 1000), kidney failure [usually temporary] (1 in 500), bleeding (1 in 200), allergic reaction [possibly serious] (1 in 200)], benefits (diagnostic support and management of coronary artery disease) and alternatives of a cardiac catheterization were discussed in detail with Stuart Henry and he is willing to proceed.     Dispo: Return to clinic in 3 weeks.  Signed, Lonni Hanson, MD

## 2024-06-16 ENCOUNTER — Ambulatory Visit: Payer: Self-pay | Admitting: Internal Medicine

## 2024-06-16 ENCOUNTER — Encounter: Payer: Self-pay | Admitting: Internal Medicine

## 2024-06-16 LAB — CBC
Hematocrit: 44.1 % (ref 37.5–51.0)
Hemoglobin: 14.6 g/dL (ref 13.0–17.7)
MCH: 30 pg (ref 26.6–33.0)
MCHC: 33.1 g/dL (ref 31.5–35.7)
MCV: 91 fL (ref 79–97)
Platelets: 197 x10E3/uL (ref 150–450)
RBC: 4.87 x10E6/uL (ref 4.14–5.80)
RDW: 12.8 % (ref 11.6–15.4)
WBC: 6.1 x10E3/uL (ref 3.4–10.8)

## 2024-06-16 LAB — BASIC METABOLIC PANEL WITH GFR
BUN/Creatinine Ratio: 13 (ref 10–24)
BUN: 16 mg/dL (ref 8–27)
CO2: 23 mmol/L (ref 20–29)
Calcium: 9.7 mg/dL (ref 8.6–10.2)
Chloride: 102 mmol/L (ref 96–106)
Creatinine, Ser: 1.23 mg/dL (ref 0.76–1.27)
Glucose: 130 mg/dL — ABNORMAL HIGH (ref 70–99)
Potassium: 4.7 mmol/L (ref 3.5–5.2)
Sodium: 139 mmol/L (ref 134–144)
eGFR: 59 mL/min/1.73 — ABNORMAL LOW (ref >59)

## 2024-06-20 ENCOUNTER — Other Ambulatory Visit: Payer: Self-pay | Admitting: Family Medicine

## 2024-06-20 DIAGNOSIS — E1129 Type 2 diabetes mellitus with other diabetic kidney complication: Secondary | ICD-10-CM

## 2024-06-21 ENCOUNTER — Observation Stay
Admission: RE | Admit: 2024-06-21 | Discharge: 2024-06-22 | Disposition: A | Discharge status: Home / Self Care | Attending: Internal Medicine | Admitting: Internal Medicine

## 2024-06-21 ENCOUNTER — Observation Stay (HOSPITAL_BASED_OUTPATIENT_CLINIC_OR_DEPARTMENT_OTHER)
Admission: RE | Admit: 2024-06-21 | Discharge: 2024-06-21 | Disposition: A | Discharge status: Home / Self Care | Source: Home / Self Care | Attending: Internal Medicine | Admitting: Internal Medicine

## 2024-06-21 ENCOUNTER — Encounter
Admission: RE | Disposition: A | Discharge status: Home / Self Care | Payer: Self-pay | Source: Home / Self Care | Attending: Internal Medicine

## 2024-06-21 ENCOUNTER — Encounter: Payer: Self-pay | Admitting: Internal Medicine

## 2024-06-21 ENCOUNTER — Other Ambulatory Visit: Payer: Self-pay

## 2024-06-21 DIAGNOSIS — I255 Ischemic cardiomyopathy: Secondary | ICD-10-CM | POA: Diagnosis not present

## 2024-06-21 DIAGNOSIS — R079 Chest pain, unspecified: Secondary | ICD-10-CM

## 2024-06-21 DIAGNOSIS — R5383 Other fatigue: Secondary | ICD-10-CM | POA: Diagnosis not present

## 2024-06-21 DIAGNOSIS — R9439 Abnormal result of other cardiovascular function study: Secondary | ICD-10-CM | POA: Diagnosis not present

## 2024-06-21 DIAGNOSIS — I25119 Atherosclerotic heart disease of native coronary artery with unspecified angina pectoris: Secondary | ICD-10-CM | POA: Diagnosis present

## 2024-06-21 DIAGNOSIS — E785 Hyperlipidemia, unspecified: Secondary | ICD-10-CM | POA: Diagnosis not present

## 2024-06-21 DIAGNOSIS — I10 Essential (primary) hypertension: Secondary | ICD-10-CM | POA: Diagnosis present

## 2024-06-21 DIAGNOSIS — I441 Atrioventricular block, second degree: Secondary | ICD-10-CM | POA: Insufficient documentation

## 2024-06-21 DIAGNOSIS — Z951 Presence of aortocoronary bypass graft: Secondary | ICD-10-CM

## 2024-06-21 DIAGNOSIS — Z794 Long term (current) use of insulin: Secondary | ICD-10-CM | POA: Diagnosis not present

## 2024-06-21 DIAGNOSIS — Z7982 Long term (current) use of aspirin: Secondary | ICD-10-CM | POA: Insufficient documentation

## 2024-06-21 DIAGNOSIS — I2510 Atherosclerotic heart disease of native coronary artery without angina pectoris: Secondary | ICD-10-CM

## 2024-06-21 DIAGNOSIS — I25118 Atherosclerotic heart disease of native coronary artery with other forms of angina pectoris: Secondary | ICD-10-CM | POA: Diagnosis not present

## 2024-06-21 DIAGNOSIS — I25718 Atherosclerosis of autologous vein coronary artery bypass graft(s) with other forms of angina pectoris: Secondary | ICD-10-CM | POA: Diagnosis not present

## 2024-06-21 DIAGNOSIS — Z79899 Other long term (current) drug therapy: Secondary | ICD-10-CM | POA: Diagnosis not present

## 2024-06-21 DIAGNOSIS — E119 Type 2 diabetes mellitus without complications: Secondary | ICD-10-CM | POA: Insufficient documentation

## 2024-06-21 DIAGNOSIS — E1169 Type 2 diabetes mellitus with other specified complication: Secondary | ICD-10-CM | POA: Diagnosis present

## 2024-06-21 HISTORY — DX: Hypothyroidism, unspecified: E03.9

## 2024-06-21 HISTORY — PX: LEFT HEART CATH AND CORS/GRAFTS ANGIOGRAPHY: CATH118250

## 2024-06-21 HISTORY — PX: CORONARY STENT INTERVENTION: CATH118234

## 2024-06-21 LAB — GLUCOSE, CAPILLARY
Glucose-Capillary: 194 mg/dL — ABNORMAL HIGH (ref 70–99)
Glucose-Capillary: 201 mg/dL — ABNORMAL HIGH (ref 70–99)

## 2024-06-21 LAB — POCT ACTIVATED CLOTTING TIME
Activated Clotting Time: 262 s
Activated Clotting Time: 274 s
Activated Clotting Time: 279 s

## 2024-06-21 SURGERY — LEFT HEART CATH AND CORS/GRAFTS ANGIOGRAPHY
Anesthesia: Moderate Sedation

## 2024-06-21 MED ORDER — SODIUM CHLORIDE 0.9 % WEIGHT BASED INFUSION
1.0000 mL/kg/h | INTRAVENOUS | Status: DC
  Administered 2024-06-21 (×2): 1 mL/kg/h via INTRAVENOUS

## 2024-06-21 MED ORDER — TAMSULOSIN HCL 0.4 MG PO CAPS
0.4000 mg | ORAL_CAPSULE | Freq: Every day | ORAL | Status: DC
  Administered 2024-06-21: 0.4 mg via ORAL
  Filled 2024-06-21: qty 1

## 2024-06-21 MED ORDER — CLOPIDOGREL BISULFATE 75 MG PO TABS
ORAL_TABLET | ORAL | Status: AC
  Filled 2024-06-21: qty 8

## 2024-06-21 MED ORDER — CLOPIDOGREL BISULFATE 75 MG PO TABS
ORAL_TABLET | ORAL | Status: DC | PRN
  Administered 2024-06-21: 600 mg via ORAL

## 2024-06-21 MED ORDER — LIDOCAINE HCL (PF) 1 % IJ SOLN
INTRAMUSCULAR | Status: DC | PRN
  Administered 2024-06-21: 2 mL

## 2024-06-21 MED ORDER — ACETAMINOPHEN 325 MG PO TABS: 650.0000 mg | ORAL_TABLET | ORAL | Status: DC | PRN

## 2024-06-21 MED ORDER — SODIUM CHLORIDE 0.9 % WEIGHT BASED INFUSION: 3.0000 mL/kg/h | INTRAVENOUS | Status: DC

## 2024-06-21 MED ORDER — HYDRALAZINE HCL 20 MG/ML IJ SOLN: 10.0000 mg | INTRAMUSCULAR | Status: AC | PRN

## 2024-06-21 MED ORDER — NITROGLYCERIN 1 MG/10 ML FOR IR/CATH LAB
INTRA_ARTERIAL | Status: DC | PRN
  Administered 2024-06-21: 200 ug via INTRACORONARY

## 2024-06-21 MED ORDER — ISOSORBIDE MONONITRATE ER 30 MG PO TB24
30.0000 mg | ORAL_TABLET | Freq: Every day | ORAL | Status: DC
  Administered 2024-06-21 – 2024-06-22 (×2): 30 mg via ORAL
  Filled 2024-06-21 (×2): qty 1

## 2024-06-21 MED ORDER — LISINOPRIL 10 MG PO TABS
40.0000 mg | ORAL_TABLET | Freq: Every day | ORAL | Status: DC
  Administered 2024-06-21 – 2024-06-22 (×2): 40 mg via ORAL
  Filled 2024-06-21 (×2): qty 4

## 2024-06-21 MED ORDER — IOHEXOL 300 MG/ML  SOLN
INTRAMUSCULAR | Status: DC | PRN
  Administered 2024-06-21: 108 mL

## 2024-06-21 MED ORDER — SODIUM CHLORIDE 0.9% FLUSH
3.0000 mL | Freq: Two times a day (BID) | INTRAVENOUS | Status: DC
  Administered 2024-06-21 – 2024-06-22 (×2): 3 mL via INTRAVENOUS

## 2024-06-21 MED ORDER — INSULIN ASPART 100 UNIT/ML IJ SOLN
0.0000 [IU] | Freq: Three times a day (TID) | INTRAMUSCULAR | Status: DC
  Administered 2024-06-21: 5 [IU] via SUBCUTANEOUS
  Administered 2024-06-22: 2 [IU] via SUBCUTANEOUS
  Filled 2024-06-21: qty 5
  Filled 2024-06-21: qty 2

## 2024-06-21 MED ORDER — HEPARIN SODIUM (PORCINE) 1000 UNIT/ML IJ SOLN
INTRAMUSCULAR | Status: AC
Start: 2024-06-21 — End: 2024-06-21
  Filled 2024-06-21: qty 10

## 2024-06-21 MED ORDER — VERAPAMIL HCL 2.5 MG/ML IV SOLN
INTRAVENOUS | Status: DC | PRN
  Administered 2024-06-21 (×2): 2.5 mg via INTRA_ARTERIAL

## 2024-06-21 MED ORDER — HEPARIN SODIUM (PORCINE) 1000 UNIT/ML IJ SOLN
INTRAMUSCULAR | Status: DC | PRN
  Administered 2024-06-21: 2000 [IU] via INTRAVENOUS
  Administered 2024-06-21 (×2): 5000 [IU] via INTRAVENOUS
  Administered 2024-06-21 (×2): 2000 [IU] via INTRAVENOUS

## 2024-06-21 MED ORDER — ROSUVASTATIN CALCIUM 10 MG PO TABS
10.0000 mg | ORAL_TABLET | Freq: Every day | ORAL | Status: DC
  Administered 2024-06-21 – 2024-06-22 (×2): 10 mg via ORAL
  Filled 2024-06-21 (×2): qty 1

## 2024-06-21 MED ORDER — LEVOTHYROXINE SODIUM 25 MCG PO TABS
25.0000 ug | ORAL_TABLET | Freq: Every day | ORAL | Status: DC
  Administered 2024-06-22: 25 ug via ORAL
  Filled 2024-06-21: qty 1

## 2024-06-21 MED ORDER — FENTANYL CITRATE (PF) 100 MCG/2ML IJ SOLN
INTRAMUSCULAR | Status: DC | PRN
  Administered 2024-06-21 (×2): 25 ug via INTRAVENOUS

## 2024-06-21 MED ORDER — VERAPAMIL HCL 2.5 MG/ML IV SOLN
INTRAVENOUS | Status: AC
  Filled 2024-06-21: qty 2

## 2024-06-21 MED ORDER — ALLOPURINOL 100 MG PO TABS
100.0000 mg | ORAL_TABLET | Freq: Every day | ORAL | Status: DC
  Administered 2024-06-22: 100 mg via ORAL
  Filled 2024-06-21 (×2): qty 1

## 2024-06-21 MED ORDER — FENTANYL CITRATE (PF) 100 MCG/2ML IJ SOLN
INTRAMUSCULAR | Status: AC
  Filled 2024-06-21: qty 2

## 2024-06-21 MED ORDER — FREE WATER
500.0000 mL | Freq: Once | Status: AC
  Administered 2024-06-21: 500 mL via ORAL

## 2024-06-21 MED ORDER — CLOPIDOGREL BISULFATE 75 MG PO TABS
75.0000 mg | ORAL_TABLET | Freq: Every day | ORAL | Status: DC
  Administered 2024-06-22: 75 mg via ORAL
  Filled 2024-06-21: qty 1

## 2024-06-21 MED ORDER — INSULIN GLARGINE-YFGN 100 UNIT/ML ~~LOC~~ SOLN
30.0000 [IU] | Freq: Every day | SUBCUTANEOUS | Status: DC
  Administered 2024-06-21 – 2024-06-22 (×2): 30 [IU] via SUBCUTANEOUS
  Filled 2024-06-21 (×2): qty 0.3

## 2024-06-21 MED ORDER — MIDAZOLAM HCL 2 MG/2ML IJ SOLN
INTRAMUSCULAR | Status: AC
  Filled 2024-06-21: qty 2

## 2024-06-21 MED ORDER — SODIUM CHLORIDE 0.9 % IV SOLN: 250.0000 mL | INTRAVENOUS | Status: AC | PRN

## 2024-06-21 MED ORDER — HEPARIN SODIUM (PORCINE) 1000 UNIT/ML IJ SOLN
INTRAMUSCULAR | Status: AC
  Filled 2024-06-21: qty 10

## 2024-06-21 MED ORDER — ASPIRIN 81 MG PO TBEC
81.0000 mg | DELAYED_RELEASE_TABLET | Freq: Every day | ORAL | Status: DC
  Administered 2024-06-22: 81 mg via ORAL
  Filled 2024-06-21: qty 1

## 2024-06-21 MED ORDER — LABETALOL HCL 5 MG/ML IV SOLN: 10.0000 mg | INTRAVENOUS | Status: AC | PRN

## 2024-06-21 MED ORDER — SODIUM CHLORIDE 0.9% FLUSH: 3.0000 mL | INTRAVENOUS | Status: DC | PRN

## 2024-06-21 MED ORDER — ONDANSETRON HCL 4 MG/2ML IJ SOLN: 4.0000 mg | Freq: Four times a day (QID) | INTRAMUSCULAR | Status: DC | PRN

## 2024-06-21 MED ORDER — NITROGLYCERIN 1 MG/10 ML FOR IR/CATH LAB
INTRA_ARTERIAL | Status: AC
  Filled 2024-06-21: qty 10

## 2024-06-21 MED ORDER — HEPARIN (PORCINE) IN NACL 1000-0.9 UT/500ML-% IV SOLN
INTRAVENOUS | Status: AC
  Filled 2024-06-21: qty 1000

## 2024-06-21 MED ORDER — NITROGLYCERIN 0.4 MG SL SUBL
0.4000 mg | SUBLINGUAL_TABLET | SUBLINGUAL | Status: DC | PRN
Start: 2024-06-21 — End: 2024-06-22

## 2024-06-21 MED ORDER — GABAPENTIN 300 MG PO CAPS
300.0000 mg | ORAL_CAPSULE | Freq: Every day | ORAL | Status: DC
  Administered 2024-06-21: 300 mg via ORAL
  Filled 2024-06-21: qty 1

## 2024-06-21 MED ORDER — ASPIRIN 81 MG PO CHEW: 81.0000 mg | CHEWABLE_TABLET | ORAL | Status: DC

## 2024-06-21 MED ORDER — LIDOCAINE HCL 1 % IJ SOLN
INTRAMUSCULAR | Status: AC
  Filled 2024-06-21: qty 20

## 2024-06-21 MED ORDER — HEPARIN (PORCINE) IN NACL 1000-0.9 UT/500ML-% IV SOLN
INTRAVENOUS | Status: DC | PRN
  Administered 2024-06-21: 1000 mL

## 2024-06-21 MED ORDER — MIDAZOLAM HCL (PF) 2 MG/2ML IJ SOLN
INTRAMUSCULAR | Status: DC | PRN
  Administered 2024-06-21: 1 mg via INTRAVENOUS

## 2024-06-21 SURGICAL SUPPLY — 19 items
BALLOON MINITREK RX 2.0X12 (BALLOONS) IMPLANT
BALLOON ~~LOC~~ TREK NEO RX 2.75X15 (BALLOONS) IMPLANT
CATH INFINITI 5 FR IM (CATHETERS) IMPLANT
CATH INFINITI 5 FR MPA2 (CATHETERS) IMPLANT
CATH VISTA GUIDE 6FR AL1 MULPK (CATHETERS) IMPLANT
DEVICE RAD TR BAND REGULAR (VASCULAR PRODUCTS) IMPLANT
DRAPE BRACHIAL (DRAPES) IMPLANT
GLIDESHEATH SLEND SS 6F .021 (SHEATH) IMPLANT
GUIDEWIRE INQWIRE 1.5J.035X260 (WIRE) IMPLANT
KIT ENCORE 26 ADVANTAGE (KITS) IMPLANT
KIT SYRINGE INJ CVI SPIKEX1 (MISCELLANEOUS) IMPLANT
PACK CARDIAC CATH (CUSTOM PROCEDURE TRAY) ×2 IMPLANT
PAD ELECT DEFIB RADIOL ZOLL (MISCELLANEOUS) IMPLANT
SET ATX-X65L (MISCELLANEOUS) IMPLANT
STATION PROTECTION PRESSURIZED (MISCELLANEOUS) IMPLANT
STENT ONYX FRONTIER 2.5X38 (Permanent Stent) IMPLANT
TUBING CIL FLEX 10 FLL-RA (TUBING) IMPLANT
WIRE HITORQ VERSACORE ST 145CM (WIRE) IMPLANT
WIRE RUNTHROUGH .014X180CM (WIRE) IMPLANT

## 2024-06-21 NOTE — Brief Op Note (Signed)
 BRIEF CARDIAC CATHETERIZATION NOTE  06/21/2024  11:49 AM  PATIENT:  Stuart Henry  83 y.o. male  PRE-OPERATIVE DIAGNOSIS: Coronary artery disease with stable angina and abnormal stress test  POST-OPERATIVE DIAGNOSIS: Same  PROCEDURE:  Procedure(s): LEFT HEART CATH AND CORS/GRAFTS ANGIOGRAPHY (Left) CORONARY STENT INTERVENTION (N/A)  SURGEON:  Surgeons and Role:    * Dashanique Brownstein, Lonni, MD - Primary  FINDINGS: Severe native left coronary artery disease with chronic total occlusion of ostial LAD as well as 90% ostial ramus intermedius and ostial LCx stenoses.  Right coronary demonstrates mild, nonobstructive CAD Widely patent LIMA-LAD. Patent SVG-D1 with sequential 80-90% proximal and mid graft stenoses. Chronically occluded sequential SVG-medial ramus intermedius-lateral ramus intermedius. Upper normal left ventricular filling pressure (LVEDP 15 mmHg). Successful PCI to SVG-D1 using Onyx frontier 2.5 x 38 mm drug-eluting stent with 0% residual stenosis and TIMI-3 flow.  RECOMMENDATIONS: Overnight observation. Dual antiplatelet therapy with aspirin  and clopidogrel for at least 6 months. Obtain echocardiogram. Continue antianginal therapy for residual left coronary artery disease.  Anticipate having patient follow-up in the next 1 to 2 weeks in the clinic to reassess his symptoms and consider staged PCI to ostial ramus intermedius disease at [facility] (may require coronary atherectomy and/or lithotripsy).  Lonni Hanson, MD [facility]

## 2024-06-21 NOTE — Plan of Care (Signed)
  Problem: Education: Goal: Understanding of CV disease, CV risk reduction, and recovery process will improve Outcome: Progressing Goal: Individualized Educational Video(s) Outcome: Progressing   Problem: Activity: Goal: Ability to return to baseline activity level will improve Outcome: Progressing   Problem: Cardiovascular: Goal: Ability to achieve and maintain adequate cardiovascular perfusion will improve Outcome: Progressing Goal: Vascular access site(s) Level 0-1 will be maintained Outcome: Progressing   Problem: Health Behavior/Discharge Planning: Goal: Ability to safely manage health-related needs after discharge will improve Outcome: Progressing   Problem: Education: Goal: Ability to describe self-care measures that may prevent or decrease complications (Diabetes Survival Skills Education) will improve Outcome: Progressing Goal: Individualized Educational Video(s) Outcome: Progressing   Problem: Coping: Goal: Ability to adjust to condition or change in health will improve Outcome: Progressing   Problem: Fluid Volume: Goal: Ability to maintain a balanced intake and output will improve Outcome: Progressing   Problem: Health Behavior/Discharge Planning: Goal: Ability to identify and utilize available resources and services will improve Outcome: Progressing Goal: Ability to manage health-related needs will improve Outcome: Progressing   Problem: Metabolic: Goal: Ability to maintain appropriate glucose levels will improve Outcome: Progressing   Problem: Nutritional: Goal: Maintenance of adequate nutrition will improve Outcome: Progressing Goal: Progress toward achieving an optimal weight will improve Outcome: Progressing   Problem: Skin Integrity: Goal: Risk for impaired skin integrity will decrease Outcome: Progressing   Problem: Tissue Perfusion: Goal: Adequacy of tissue perfusion will improve Outcome: Progressing   Problem: Education: Goal: Knowledge  of General Education information will improve Description: Including pain rating scale, medication(s)/side effects and non-pharmacologic comfort measures Outcome: Progressing   Problem: Health Behavior/Discharge Planning: Goal: Ability to manage health-related needs will improve Outcome: Progressing   Problem: Clinical Measurements: Goal: Ability to maintain clinical measurements within normal limits will improve Outcome: Progressing Goal: Will remain free from infection Outcome: Progressing Goal: Diagnostic test results will improve Outcome: Progressing Goal: Respiratory complications will improve Outcome: Progressing Goal: Cardiovascular complication will be avoided Outcome: Progressing   Problem: Activity: Goal: Risk for activity intolerance will decrease Outcome: Progressing   Problem: Nutrition: Goal: Adequate nutrition will be maintained Outcome: Progressing   Problem: Coping: Goal: Level of anxiety will decrease Outcome: Progressing   Problem: Elimination: Goal: Will not experience complications related to bowel motility Outcome: Progressing Goal: Will not experience complications related to urinary retention Outcome: Progressing   Problem: Pain Managment: Goal: General experience of comfort will improve and/or be controlled Outcome: Progressing   Problem: Safety: Goal: Ability to remain free from injury will improve Outcome: Progressing   Problem: Skin Integrity: Goal: Risk for impaired skin integrity will decrease Outcome: Progressing

## 2024-06-21 NOTE — Care Management Obs Status (Signed)
 MEDICARE OBSERVATION STATUS NOTIFICATION   Patient Details  Name: Stuart Henry MRN: [id number] Date of Birth: 06/11/1941   Medicare Observation Status Notification Given:  Yes    Rojelio [name] 06/21/2024, 3:56 PM

## 2024-06-21 NOTE — Progress Notes (Addendum)
 Patient admitted to PCU from cathlab. Upon arrival, patient placed on continuous cardiac telemetry monitoring. Vital signs obtained and documented. Patient oriented to room, call bell system, and unit routines. Safety measures implemented: bed in lowest position, call bell in reach, and side rails up x3. Patient repositioned for comfort and skin protection. Initial head-to-toes assessment completed. Patient resting comfortably in bed, no acute distress noted at this time.

## 2024-06-21 NOTE — Interval H&P Note (Signed)
 History and Physical Interval Note:  06/21/2024 9:26 AM  Stuart Henry  has presented today for surgery, with the diagnosis of coronary artery disease with stable angina and abnormal stress test.  The various methods of treatment have been discussed with the patient and family. After consideration of risks, benefits and other options for treatment, the patient has consented to  Procedure(s): LEFT HEART CATH AND CORS/GRAFTS ANGIOGRAPHY (Left) as a surgical intervention.  The patient's history has been reviewed, patient examined, no change in status, stable for surgery.  I have reviewed the patient's chart and labs.  Questions were answered to the patient's satisfaction.    Cath Lab Visit (complete for each Cath Lab visit)  Clinical Evaluation Leading to the Procedure:   ACS: No.  Non-ACS:    Anginal Classification: CCS III  Anti-ischemic medical therapy: Minimal Therapy (1 class of medications)  Non-Invasive Test Results: Intermediate-risk stress test findings: cardiac mortality 1-3%/year  Prior CABG: Previous CABG  Stuart Henry

## 2024-06-22 ENCOUNTER — Encounter: Payer: Self-pay | Admitting: Internal Medicine

## 2024-06-22 ENCOUNTER — Other Ambulatory Visit: Payer: Self-pay

## 2024-06-22 DIAGNOSIS — Z951 Presence of aortocoronary bypass graft: Secondary | ICD-10-CM

## 2024-06-22 DIAGNOSIS — I25119 Atherosclerotic heart disease of native coronary artery with unspecified angina pectoris: Principal | ICD-10-CM

## 2024-06-22 DIAGNOSIS — E782 Mixed hyperlipidemia: Secondary | ICD-10-CM | POA: Diagnosis not present

## 2024-06-22 LAB — CBC
HCT: 38.2 % — ABNORMAL LOW (ref 39.0–52.0)
Hemoglobin: 12.8 g/dL — ABNORMAL LOW (ref 13.0–17.0)
MCH: 29.4 pg (ref 26.0–34.0)
MCHC: 33.5 g/dL (ref 30.0–36.0)
MCV: 87.8 fL (ref 80.0–100.0)
Platelets: 162 K/uL (ref 150–400)
RBC: 4.35 MIL/uL (ref 4.22–5.81)
RDW: 12.2 % (ref 11.5–15.5)
WBC: 5.1 K/uL (ref 4.0–10.5)
nRBC: 0 % (ref 0.0–0.2)

## 2024-06-22 LAB — BASIC METABOLIC PANEL WITH GFR
Anion gap: 9 (ref 5–15)
BUN: 15 mg/dL (ref 8–23)
CO2: 28 mmol/L (ref 22–32)
Calcium: 9.5 mg/dL (ref 8.9–10.3)
Chloride: 103 mmol/L (ref 98–111)
Creatinine, Ser: 1.16 mg/dL (ref 0.61–1.24)
GFR, Estimated: >60 mL/min (ref >60)
Glucose, Bld: 95 mg/dL (ref 70–99)
Potassium: 4.1 mmol/L (ref 3.5–5.1)
Sodium: 140 mmol/L (ref 135–145)

## 2024-06-22 LAB — ECHOCARDIOGRAM COMPLETE
Area-P 1/2: 3.91 cm2
Height: 76 in
S' Lateral: 3 cm
Weight: 3536 [oz_av]

## 2024-06-22 LAB — GLUCOSE, CAPILLARY
Glucose-Capillary: 129 mg/dL — ABNORMAL HIGH (ref 70–99)
Glucose-Capillary: 154 mg/dL — ABNORMAL HIGH (ref 70–99)

## 2024-06-22 MED ORDER — CLOPIDOGREL BISULFATE 75 MG PO TABS
75.0000 mg | ORAL_TABLET | Freq: Every day | ORAL | 3 refills | Status: AC
  Filled 2024-06-22: qty 90, 90d supply, fill #0

## 2024-06-22 NOTE — Plan of Care (Signed)
  Problem: Education: Goal: Understanding of CV disease, CV risk reduction, and recovery process will improve Outcome: Progressing Goal: Individualized Educational Video(s) Outcome: Progressing   Problem: Activity: Goal: Ability to return to baseline activity level will improve Outcome: Progressing   Problem: Cardiovascular: Goal: Ability to achieve and maintain adequate cardiovascular perfusion will improve Outcome: Progressing Goal: Vascular access site(s) Level 0-1 will be maintained Outcome: Progressing   Problem: Health Behavior/Discharge Planning: Goal: Ability to safely manage health-related needs after discharge will improve Outcome: Progressing   Problem: Education: Goal: Ability to describe self-care measures that may prevent or decrease complications (Diabetes Survival Skills Education) will improve Outcome: Progressing Goal: Individualized Educational Video(s) Outcome: Progressing   Problem: Coping: Goal: Ability to adjust to condition or change in health will improve Outcome: Progressing   Problem: Fluid Volume: Goal: Ability to maintain a balanced intake and output will improve Outcome: Progressing   Problem: Health Behavior/Discharge Planning: Goal: Ability to identify and utilize available resources and services will improve Outcome: Progressing Goal: Ability to manage health-related needs will improve Outcome: Progressing   Problem: Metabolic: Goal: Ability to maintain appropriate glucose levels will improve Outcome: Progressing   Problem: Nutritional: Goal: Maintenance of adequate nutrition will improve Outcome: Progressing Goal: Progress toward achieving an optimal weight will improve Outcome: Progressing   Problem: Skin Integrity: Goal: Risk for impaired skin integrity will decrease Outcome: Progressing   Problem: Tissue Perfusion: Goal: Adequacy of tissue perfusion will improve Outcome: Progressing   Problem: Education: Goal: Knowledge  of General Education information will improve Description: Including pain rating scale, medication(s)/side effects and non-pharmacologic comfort measures Outcome: Progressing   Problem: Health Behavior/Discharge Planning: Goal: Ability to manage health-related needs will improve Outcome: Progressing   Problem: Clinical Measurements: Goal: Ability to maintain clinical measurements within normal limits will improve Outcome: Progressing Goal: Will remain free from infection Outcome: Progressing Goal: Diagnostic test results will improve Outcome: Progressing Goal: Respiratory complications will improve Outcome: Progressing Goal: Cardiovascular complication will be avoided Outcome: Progressing   Problem: Activity: Goal: Risk for activity intolerance will decrease Outcome: Progressing   Problem: Nutrition: Goal: Adequate nutrition will be maintained Outcome: Progressing   Problem: Coping: Goal: Level of anxiety will decrease Outcome: Progressing   Problem: Elimination: Goal: Will not experience complications related to bowel motility Outcome: Progressing Goal: Will not experience complications related to urinary retention Outcome: Progressing   Problem: Pain Managment: Goal: General experience of comfort will improve and/or be controlled Outcome: Progressing   Problem: Safety: Goal: Ability to remain free from injury will improve Outcome: Progressing   Problem: Skin Integrity: Goal: Risk for impaired skin integrity will decrease Outcome: Progressing

## 2024-06-22 NOTE — TOC CM/SW Note (Signed)
 Transition of Care [facility]) - Inpatient Brief Assessment   Patient Details  Name: Stuart Henry MRN: [id number] Date of Birth: May 24, 1941  Transition of Care [facility]) CM/SW Contact:    Shasta DELENA Daring, RN Phone Number: 06/22/2024, 11:49 AM   Clinical Narrative:  RNCM reviewed chart. No TOC needs identified. Patient has orders to discharge.  RNCM signing off.  Transition of Care Asessment: Insurance and Status: Insurance coverage has been reviewed Patient has primary care physician: Yes Home environment has been reviewed: single family home     Social Drivers of Health Review: SDOH reviewed no interventions necessary Readmission risk has been reviewed: Yes Transition of care needs: no transition of care needs at this time

## 2024-06-22 NOTE — Discharge Summary (Addendum)
 Discharge Summary   Patient ID: Stuart Henry MRN: [id number]; DOB: 1941-06-16  Admit date: 06/21/2024 Discharge date: 06/22/2024  PCP:  Gasper Nancyann BRAVO, MD   [facility] Providers Cardiologist:  Lonni Hanson, MD     Discharge Diagnoses  Principal Problem:   Coronary artery disease involving native coronary artery of native heart with angina pectoris Active Problems:   Essential hypertension   Hyperlipidemia associated with type 2 diabetes mellitus (HCC)   S/P CABG x 4   Ischemic cardiomyopathy   Fatigue   Abnormal stress test   Diagnostic Studies/Procedures   LHC 06/21/24 Conclusions: Severe native left coronary artery disease with chronic total occlusion of ostial LAD as well as 90% ostial ramus intermedius and ostial LCx stenoses.  Right coronary demonstrates mild, nonobstructive coronary artery disease Widely patent LIMA-LAD. Patent SVG-D1 with sequential 80-90% proximal and mid graft stenoses. Chronically occluded sequential SVG-medial ramus intermedius-lateral ramus intermedius. Upper normal left ventricular filling pressure (LVEDP 15 mmHg). Successful PCI to SVG-D1 using Onyx Frontier 2.5 x 38 mm drug-eluting stent with 0% residual stenosis and TIMI-3 flow.   Recommendations: Overnight observation. Dual antiplatelet therapy with aspirin  and clopidogrel for at least 6 months. Obtain echocardiogram. Continue antianginal therapy for residual left coronary artery disease.  Anticipate having patient follow-up in the next 1 to 2 weeks in the clinic to reassess his symptoms and consider staged PCI to ostial ramus intermedius +/- LCx at [facility] (may require coronary atherectomy and/or lithotripsy).   Lonni Hanson, MD [facility]  Coronary Diagrams  Diagnostic Dominance: Right  Intervention     Echo 06/21/24  1. Left ventricular ejection fraction, by estimation, is 50 to 55%. The  left ventricle has low normal function. The left  ventricle demonstrates  regional wall motion abnormalities (mild anterior and anteroseptal wall  hypokinesis). There is mild left  ventricular hypertrophy.   2. Right ventricular systolic function is mildly reduced. The right  ventricular size is normal.   3. Left atrial size was mildly dilated.   4. The mitral valve is normal in structure. Mild to moderate mitral valve  regurgitation. No evidence of mitral stenosis.   5. The aortic valve is normal in structure. Aortic valve regurgitation is  not visualized. Aortic valve sclerosis/calcification is present, without  any evidence of aortic stenosis.   6. The inferior vena cava is normal in size with greater than 50%  respiratory variability, suggesting right atrial pressure of 3 mmHg.  _____________   History of Present Illness   [name] is a 83 y.o. male with a h/o CAD s/p CABG 09/2016 complicated by postoperative Afib, ICM with normalization by echo 04/2017, DM2, HTN who presents for CAD.   The patient was seen in the office 06/16/24 reporting persistent fatigue and weakness. He had a prior abnormal PET scan 9/25 that caused him concern. He had mild chest discomfort at night that was anginal equivalent, so he was set up for a cardiac catheterization. He was also noted to have Mobitz type 1 second degree aV block, but patient was asymptomatic. Plan to see EP as outpatient.    Hospital Course   Consultants: None  The patient presented 06/21/24 for the procedure. LHC showed severe native left CAD with CTO of ostial LAD as well as 90% ostial ramus intermedius and ostal Lcx stenosis. RCA demonstrates mild, nonobstructive CAD, widely patent LIMA to LAD, patent SVG-D1 with sequential 80-90% proximal and mid graft stenosis, CTO sequential SVG-medial ramus intermedius-lateral ramus intermedius, upper  normal LV filling pressures. Treated with PCI To SVG-D1. He was started on DAPT with ASA and Plavix for at least 6 months. Anticipate having  follow-up to consider staged PCI to ostial RI at [facility]. Patient was admitted post procedure and had no complications overnight. Echo showed LVEF 50-55%, mild LVH, mild to mod MR. Cath site remained stable. Patient ambulated without issues. Tele shows SR/SB with occasional PVCs. Plan to send in new prescription for Plavix, all other medications will stay the same.   The patient was seen by Dr. Gollan 06/22/24 and was felt to be stable for discharge.   General:  NAD. Neck: No JVD or HJR. Lungs: Clear to auscultation bilaterally without wheezes or crackles. Heart: Regular rate and rhythm with occasional pauses.  No murmurs. Abdomen: Soft, nontender, nondistended. Extremities: No lower extremity edema.     Did the patient have an acute coronary syndrome (MI, NSTEMI, STEMI, etc) this admission?:  No                               Did the patient have a percutaneous coronary intervention (stent / angioplasty)?:  Yes.     Cath/PCI Registry Performance & Quality Measures: Aspirin  prescribed? - Yes ADP Receptor Inhibitor (Plavix/Clopidogrel, Brilinta/Ticagrelor or Effient/Prasugrel) prescribed (includes medically managed patients)? - Yes High Intensity Statin (Lipitor 40-80mg  or Crestor  20-40mg ) prescribed? - Yes For EF <40%, was ACEI/ARB prescribed? - Not Applicable (EF >/= 40%) For EF <40%, Aldosterone Antagonist (Spironolactone or Eplerenone) prescribed? - Not Applicable (EF >/= 40%) Cardiac Rehab Phase II ordered? - Yes   _____________  Discharge Vitals Blood pressure 137/84, pulse 65, temperature 97.6 F (36.4 C), resp. rate 18, height 6' 4 (1.93 m), weight 100.2 kg, SpO2 96%.  Filed Weights   06/21/24 0903  Weight: 100.2 kg    Labs & Radiologic Studies  CBC Recent Labs    06/22/24 0506  WBC 5.1  HGB 12.8*  HCT 38.2*  MCV 87.8  PLT 162   Basic Metabolic Panel Recent Labs    88/80/74 0506  NA 140  K 4.1  CL 103  CO2 28  GLUCOSE 95  BUN 15  CREATININE 1.16  CALCIUM  9.5    Liver Function Tests No results for input(s): AST, ALT, ALKPHOS, BILITOT, PROT, ALBUMIN  in the last 72 hours. No results for input(s): LIPASE, AMYLASE in the last 72 hours. High Sensitivity Troponin:   No results for input(s): TROPONINIHS in the last 720 hours.  No results for input(s): TRNPT in the last 720 hours.  BNP Invalid input(s): POCBNP No results for input(s): PROBNP in the last 72 hours.  No results for input(s): BNP in the last 72 hours.  D-Dimer No results for input(s): DDIMER in the last 72 hours. Hemoglobin A1C No results for input(s): HGBA1C in the last 72 hours. Fasting Lipid Panel No results for input(s): CHOL, HDL, LDLCALC, TRIG, CHOLHDL, LDLDIRECT in the last 72 hours. No results found for: LIPOA  Thyroid Function Tests No results for input(s): TSH, T4TOTAL, T3FREE, THYROIDAB in the last 72 hours.  Invalid input(s): FREET3 _____________  ECHOCARDIOGRAM COMPLETE Result Date: 06/22/2024    ECHOCARDIOGRAM REPORT   Patient Name:   Stuart Henry Date of Exam: 06/21/2024 Medical Rec #:  [id number]        Height:       76.0 in Accession #:    [id number]       Weight:  221.0 lb Date of Birth:  1940-08-25        BSA:          2.311 m Patient Age:    83 years         BP:           136/79 mmHg Patient Gender: M                HR:           58 bpm. Exam Location:  [facility] Procedure: 2D Echo, Cardiac Doppler and Color Doppler (Both Spectral and Color            Flow Doppler were utilized during procedure). Indications:     I25.10 CAD  History:         Patient has prior history of Echocardiogram examinations, most                  recent 04/23/2017. CAD; Risk Factors:Diabetes.  Sonographer:     Carl Coma RDCS Referring Phys:  [id number] CHRISTOPHER END Diagnosing Phys: Timothy Gollan MD IMPRESSIONS  1. Left ventricular ejection fraction, by estimation, is 50 to 55%. The left ventricle has low normal function. The left  ventricle demonstrates regional wall motion abnormalities (mild anterior and anteroseptal wall hypokinesis). There is mild left ventricular hypertrophy.  2. Right ventricular systolic function is mildly reduced. The right ventricular size is normal.  3. Left atrial size was mildly dilated.  4. The mitral valve is normal in structure. Mild to moderate mitral valve regurgitation. No evidence of mitral stenosis.  5. The aortic valve is normal in structure. Aortic valve regurgitation is not visualized. Aortic valve sclerosis/calcification is present, without any evidence of aortic stenosis.  6. The inferior vena cava is normal in size with greater than 50% respiratory variability, suggesting right atrial pressure of 3 mmHg. FINDINGS  Left Ventricle: Left ventricular ejection fraction, by estimation, is 50 to 55%. The left ventricle has low normal function. The left ventricle demonstrates regional wall motion abnormalities. Strain was performed and the global longitudinal strain is indeterminate. The left ventricular internal cavity size was normal in size. There is mild left ventricular hypertrophy. Left ventricular diastolic parameters are indeterminate. Right Ventricle: The right ventricular size is normal. No increase in right ventricular wall thickness. Right ventricular systolic function is mildly reduced. Left Atrium: Left atrial size was mildly dilated. Right Atrium: Right atrial size was normal in size. Pericardium: There is no evidence of pericardial effusion. Mitral Valve: The mitral valve is normal in structure. Mild mitral annular calcification. Mild to moderate mitral valve regurgitation. No evidence of mitral valve stenosis. Tricuspid Valve: The tricuspid valve is normal in structure. Tricuspid valve regurgitation is not demonstrated. No evidence of tricuspid stenosis. Aortic Valve: The aortic valve is normal in structure. Aortic valve regurgitation is not visualized. Aortic valve sclerosis/calcification  is present, without any evidence of aortic stenosis. Pulmonic Valve: The pulmonic valve was normal in structure. Pulmonic valve regurgitation is not visualized. No evidence of pulmonic stenosis. Aorta: The aortic root is normal in size and structure. Venous: The inferior vena cava is normal in size with greater than 50% respiratory variability, suggesting right atrial pressure of 3 mmHg. IAS/Shunts: No atrial level shunt detected by color flow Doppler. Additional Comments: 3D was performed not requiring image post processing on an independent workstation and was indeterminate.  LEFT VENTRICLE PLAX 2D LVIDd:         4.30 cm   Diastology LVIDs:  3.00 cm   LV e' medial:    7.02 cm/s LV PW:         1.40 cm   LV E/e' medial:  12.6 LV IVS:        1.20 cm   LV e' lateral:   9.62 cm/s LVOT diam:     2.30 cm   LV E/e' lateral: 9.2 LV SV:         76 LV SV Index:   33 LVOT Area:     4.15 cm  RIGHT VENTRICLE RV Basal diam:  3.90 cm RV S prime:     10.85 cm/s TAPSE (M-mode): 1.1 cm LEFT ATRIUM             Index        RIGHT ATRIUM           Index LA diam:        4.80 cm 2.08 cm/m   RA Area:     15.40 cm LA Vol (A2C):   88.0 ml 38.07 ml/m  RA Volume:   40.40 ml  17.48 ml/m LA Vol (A4C):   60.8 ml 26.31 ml/m LA Biplane Vol: 77.9 ml 33.70 ml/m  AORTIC VALVE LVOT Vmax:   76.80 cm/s LVOT Vmean:  53.100 cm/s LVOT VTI:    0.182 m  AORTA Ao Root diam: 4.00 cm Ao Asc diam:  3.60 cm MITRAL VALVE MV Area (PHT): 3.91 cm    SHUNTS MV Decel Time: 194 msec    Systemic VTI:  0.18 m MV E velocity: 88.80 cm/s  Systemic Diam: 2.30 cm MV A velocity: 69.70 cm/s MV E/A ratio:  1.27 Evalene Lunger MD Electronically signed by Evalene Lunger MD Signature Date/Time: 06/22/2024/6:18:07 AM    Final    CARDIAC CATHETERIZATION Result Date: 06/21/2024 Conclusions: Severe native left coronary artery disease with chronic total occlusion of ostial LAD as well as 90% ostial ramus intermedius and ostial LCx stenoses.  Right coronary demonstrates  mild, nonobstructive coronary artery disease Widely patent LIMA-LAD. Patent SVG-D1 with sequential 80-90% proximal and mid graft stenoses. Chronically occluded sequential SVG-medial ramus intermedius-lateral ramus intermedius. Upper normal left ventricular filling pressure (LVEDP 15 mmHg). Successful PCI to SVG-D1 using Onyx Frontier 2.5 x 38 mm drug-eluting stent with 0% residual stenosis and TIMI-3 flow.  Recommendations: Overnight observation. Dual antiplatelet therapy with aspirin  and clopidogrel  for at least 6 months. Obtain echocardiogram. Continue antianginal therapy for residual left coronary artery disease.  Anticipate having patient follow-up in the next 1 to 2 weeks in the clinic to reassess his symptoms and consider staged PCI to ostial ramus intermedius +/- LCx at [facility] (may require coronary atherectomy and/or lithotripsy). Lonni Hanson, MD [facility]   Disposition Pt is being discharged home today in good condition.  Follow-up Plans & Appointments  Follow-up Information     End, Lonni, MD Follow up on 07/06/2024.   Specialty: Cardiology Contact information: [address] [place] [address] [phone]                Discharge Instructions     AMB Referral to Cardiac Rehabilitation - Phase II   Complete by: As directed    Diagnosis: Coronary Stents   After initial evaluation and assessments completed: Virtual Based Care may be provided alone or in conjunction with Phase 2 Cardiac Rehab based on patient barriers.: Yes   Intensive Cardiac Rehabilitation (ICR) [facility] location only OR Traditional Cardiac Rehabilitation (TCR) *If criteria for ICR are not met  will enroll in TCR [facility] only): Yes   Call MD for:  redness, tenderness, or signs of infection (pain, swelling, redness, odor or green/yellow discharge around incision site)   Complete by: As directed    Call MD for:  severe uncontrolled pain   Complete by: As directed    Call MD for:   temperature >100.4   Complete by: As directed    Diet - low sodium heart healthy   Complete by: As directed    Discharge instructions   Complete by: As directed    No driving for 1 week. No lifting over 10 lbs for 2 weeks. No sexual activity for 2 weeks.  Keep procedure site clean & dry. If you notice increased pain, swelling, bleeding or pus, call/return!  You may shower, but no soaking baths/hot tubs/pools for 1 week.   Increase activity slowly   Complete by: As directed        Discharge Medications Allergies as of 06/22/2024   No Known Allergies      Medication List     TAKE these medications    Accu-Chek Aviva Plus test strip Generic drug: glucose blood USE AS INSTRUCTED TO CHECK BLOOD SUGAR ONCE DAILY FOR TYPE 2  DIABETES   Accu-Chek Guide Test test strip Generic drug: glucose blood Use as instructed to check blood sugars up to 3 times a day. 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 Dx code E11.29   allopurinol  100 MG tablet Commonly known as: ZYLOPRIM  Take 1 tablet by mouth once daily   aspirin  EC 81 MG tablet Take 1 tablet (81 mg total) by mouth daily.   clopidogrel 75 MG tablet Commonly known as: PLAVIX Take 1 tablet (75 mg total) by mouth daily with breakfast. Start taking on: June 23, 2024   FreeStyle Libre 14 Day Reader Espiridion Use to check blood sugar via CGM.   FreeStyle Libre 3 Reader [place] Use to check blood sugars continuously. Dx code E11.29   FreeStyle Libre 3 Plus Sensor Misc Change sensor every 15 days.   gabapentin  300 MG capsule Commonly known as: NEURONTIN  TAKE 1 CAPSULE BY MOUTH AT BEDTIME FOR  NEUROPATHY   glipiZIDE  5 MG tablet Commonly known as: GLUCOTROL  TAKE 1 TABLET BY MOUTH TWICE DAILY BEFORE A MEAL   isosorbide  mononitrate 30 MG 24 hr tablet Commonly known as: IMDUR  Take 1 tablet (30 mg  total) by mouth daily.   Janumet XR 50-1000 MG Tb24 Generic drug: SitaGLIPtin-MetFORMIN  HCl Take 1 tablet by mouth in the morning and at bedtime.   ketoconazole  2 % shampoo Commonly known as: NIZORAL  SHAMPOO TOPICALLY TWICE WEEKLY   Lantus  SoloStar 100 UNIT/ML Solostar Pen Generic drug: insulin  glargine Inject 34 units into the skin each morning. Increase by 2 units every 2 days for fasting blood sugar >150 mg/dL. Max dose of 44 units daily. What changed: additional instructions   levothyroxine  25 MCG tablet Commonly known as: SYNTHROID  Take 1 tablet (25 mcg total) by mouth daily.   lisinopril  40 MG tablet Commonly known as: ZESTRIL  Take 1 tablet (40 mg total) by mouth daily.   nitroGLYCERIN  0.4 MG SL tablet Commonly known as: NITROSTAT  DISSOLVE ONE TABLET UNDER THE TONGUE EVERY 5 MINUTES AS NEEDED FOR CHEST PAIN.  DO NOT EXCEED A TOTAL OF 3 DOSES IN 15 MINUTES   Pen  Needles 32G X 4 MM Misc Use to inject insulin  daily as prescribed. Dx code E11.29   rosuvastatin  10 MG tablet Commonly known as: CRESTOR  Take 1 tablet by mouth once daily   tamsulosin  0.4 MG Caps capsule Commonly known as: FLOMAX  Take 1 capsule (0.4 mg total) by mouth daily after supper.         Outstanding Labs/Studies  N/A  Duration of Discharge Encounter: APP Time: 35 minutes   Signed, Nash Bolls H Kloi Brodman, PA-C 06/22/2024, 11:38 AM

## 2024-06-23 LAB — LIPOPROTEIN A (LPA): Lipoprotein (a): 141.8 nmol/L — ABNORMAL HIGH (ref <75.0)

## 2024-07-04 ENCOUNTER — Ambulatory Visit: Admitting: Family Medicine

## 2024-07-04 ENCOUNTER — Encounter: Payer: Self-pay | Admitting: Family Medicine

## 2024-07-04 VITALS — BP 142/72 | HR 63 | Wt 225.6 lb

## 2024-07-04 DIAGNOSIS — E039 Hypothyroidism, unspecified: Secondary | ICD-10-CM | POA: Insufficient documentation

## 2024-07-04 DIAGNOSIS — E1129 Type 2 diabetes mellitus with other diabetic kidney complication: Secondary | ICD-10-CM

## 2024-07-04 LAB — POCT GLYCOSYLATED HEMOGLOBIN (HGB A1C)
Est. average glucose Bld gHb Est-mCnc: 203
Hemoglobin A1C: 8.7 % — AB (ref 4.0–5.6)

## 2024-07-04 MED ORDER — LANTUS SOLOSTAR 100 UNIT/ML ~~LOC~~ SOPN: PEN_INJECTOR | SUBCUTANEOUS | Status: DC

## 2024-07-04 NOTE — H&P (View-Only) (Signed)
 Cardiology Office Note:  .   Date:  07/06/2024  ID:  Stuart Henry, DOB 1940-08-27, MRN [id number] PCP: Stuart Nancyann BRAVO, MD  [facility] Providers Cardiologist:  Stuart Hanson, MD     History of Present Illness: .   Stuart Henry is a 83 y.o. male with history of coronary artery disease status post CABG (09/2016) complicated by postoperative atrial fibrillation and recent PCI to SVG-diagonal (06/2024), ischemic cardiomyopathy (LVEF 50-55% by echo in 06/2024), type 2 diabetes mellitus, and hypertension, who presents for follow-up of coronary artery disease with stable angina.  I last saw mid November, at which time he complained of continued fatigue and weakness concerning for his anginal equivalent.  He was very worried about his abnormal PET/CT findings and had become afraid to exercise and play golf.  We elected to proceed with catheterization, which showed high-grade disease involving SVG-D1 and occlusion of sequential SVG-superior and inferior branches of ramus intermedius.  He underwent successful PCI to SVG-D1.  Today, Stuart Henry reports that he may be feeling a little bit better with improved energy following PCI to SVG-D1 last month.  However, he still does not feel back to his usual self from a year or two ago.  He denies any chest pain, shortness of breath, lightheadedness, and edema.  He is tolerating aspirin  and clopidogrel  well without bleeding.  ROS: See HPI  Studies Reviewed: SABRA   EKG Interpretation Date/Time:  Wednesday July 06 2024 10:41:20 EST Ventricular Rate:  63 PR Interval:  344 QRS Duration:  92 QT Interval:  436 QTC Calculation: 446 R Axis:   -31  Text Interpretation: Sinus rhythm with sinus arrhythmia with 1st degree A-V block Left axis deviation Inferior infarct (cited on or before 22-Jun-2024) Cannot rule out Anterior infarct (cited on or before 22-Jun-2024) T wave abnormality, consider lateral ischemia Abnormal ECG When compared with ECG of  22-Jun-2024 05:49, Sinus rhythm with 1st degree A-V block has replaced Mobitz I 2-degree AV block (Wenckebach block) Confirmed by Stuart Henry ([id number]) on 07/06/2024 1:08:33 PM    TTE (06/21/2024): Normal LV size with mild LVH.  LVEF 50-55% with mild anterior and anteroseptal hypokinesis.  Indeterminate diastolic parameters.  Normal RV size with mildly reduced function.  Mild left atrial enlargement.  Mild-moderate MR.  Aortic sclerosis without stenosis.  Normal CVP.  LHC/PCI (06/21/2024): Severe native left coronary artery disease with chronic total occlusion of ostial LAD as well as 90% ostial ramus intermedius and ostial LCx stenoses.  Right coronary demonstrates mild, nonobstructive coronary artery disease Widely patent LIMA-LAD. Patent SVG-D1 with sequential 80-90% proximal and mid graft stenoses. Chronically occluded sequential SVG-medial ramus intermedius-lateral ramus intermedius. Upper normal left ventricular filling pressure (LVEDP 15 mmHg). Successful PCI to SVG-D1 using Onyx Frontier 2.5 x 38 mm drug-eluting stent with 0% residual stenosis and TIMI-3 flow.  Diagnostic Dominance: Right  Intervention   Risk Assessment/Calculations:     HYPERTENSION CONTROL Vitals:   07/06/24 1034 07/06/24 1105  BP: (!) 150/70 (!) 156/74    The patient's blood pressure is elevated above target today.  In order to address the patient's elevated BP:           Physical Exam:   VS:  BP (!) 156/74 (BP Location: Left Leg, Cuff Size: Normal)   Pulse 63 Comment: 65 oximeter  Ht 6' 3 (1.905 m)   Wt 223 lb 9.6 oz (101.4 kg)   SpO2 97%   BMI 27.95 kg/m    Wt Readings from Last  3 Encounters:  07/06/24 223 lb 9.6 oz (101.4 kg)  07/04/24 225 lb 9.6 oz (102.3 kg)  06/21/24 221 lb (100.2 kg)    General:  NAD. Neck: No JVD or HJR. Lungs: Clear to auscultation bilaterally without wheezes or crackles. Heart: Regular rate and rhythm without murmurs, rubs, or gallops. Abdomen: Soft, nontender,  nondistended. Extremities: No lower extremity edema.  Left radial arteriotomy site without hematoma.  2+ radial pulses bilaterally.  ASSESSMENT AND PLAN: .    Coronary artery disease with stable angina: Stuart Henry denies any chest pain though fatigue has always been his anginal equivalent.  He has noticed mild improvement in his energy following PCI to SVG-D1 last month.  However, he still does not have the energy that he had a year or 2 ago.  I suspect that severe disease involving his ramus intermedius and LCx territory is contributing to this.  We have discussed continued medical therapy versus attempted PCI to the heavily calcified ostial ramus intermedius; Stuart Henry wishes to move forward with PCI.  We will plan to do this at [facility] so that we have atherectomy and lithotripsy available, though given relatively small size and disease involving the branches of the ramus intermedius, I would favor avoiding atherectomy if possible to minimize risk for complications.  He will need to remain on dual antiplatelet therapy with aspirin  and clopidogrel  for at least 6 months from his most recent intervention.  Continue antianginal therapy with isosorbide  mononitrate 30 mg daily as well as secondary prevention with rosuvastatin  10 mg daily.  We will repeat a CBC and BMP today in anticipation of his PCI on 07/18/2024.  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  Some of Stuart Henry fatigue could be related to conduction disease, especially if he is having longer periods of Mobitz type I second-degree AV block.  We have agreed to proceed with catheterization/PCI of the ramus intermedius, as detailed above.  He may benefit from ambulatory cardiac monitoring afterwards to better assess his conduction disease and determine if he may need permanent pacemaker placement.  No indication for pacing at this time.  Hypertension: Blood pressure suboptimally controlled  today.  However, with upcoming catheterization, we will defer medication changes at this time.  Hyperlipidemia associated with type 2 diabetes mellitus: Diabetes remains suboptimally controlled and is likely driving progression of his ischemic heart disease.  I encouraged Mr. Stetzer to remain active and keep working on his diet to help improve his glycemic control.  He should also continue to follow closely with Dr. Gasper and his diabetes coordinator.   Cardiac Rehabilitation Eligibility Assessment      Informed Consent   Shared Decision Making/Informed Consent The risks [stroke (1 in 1000), death (1 in 1000), kidney failure [usually temporary] (1 in 500), bleeding (1 in 200), allergic reaction [possibly serious] (1 in 200)], benefits (diagnostic support and management of coronary artery disease) and alternatives of a cardiac catheterization were discussed in detail with Mr. Tonkinson and he is willing to proceed.     Dispo: Return to clinic approximately 2 weeks after PCI.  Signed, Stuart Hanson, MD

## 2024-07-04 NOTE — Progress Notes (Signed)
 Established patient visit   Patient: Stuart Henry   DOB: 05/09/1941   83 y.o. Male  MRN: [id number] Visit Date: 07/04/2024  Today's healthcare provider: Nancyann Perry, MD   Chief Complaint  Patient presents with   Medical Management of Chronic Issues    T2DM    Subjective    Discussed the use of AI scribe software for clinical note transcription with the patient, who gave verbal consent to proceed.  History of Present Illness   AMAURIS Henry is an 83 year old male with type 2 diabetes who presents for management of his diabetes medications.  He has discontinued Farxiga  due to persistent urinary symptoms and cost concerns, and has since restarted Lantus  insulin . While on Farxiga , his blood sugar levels were in the 120s or lower, but he experienced recurrent infections. After discontinuing Farxiga , his blood sugar levels increased to an average of 150, occasionally reaching 176 with dietary indiscretions. His most recent A1c is 8.7, down from 8.9 in August.  He is currently using 34 units of Lantus  insulin  daily. He occasionally increases the dose to 40 units if his blood sugar spikes due to dietary choices, but returns to 34 units the following day. He monitors his blood sugar using finger sticks due to issues with the [facility] system, including the sensor falling off. No symptoms of hypoglycemia.  He is also on thyroid medication, which was prescribed after tests showed thyroid dysfunction. He has not noticed significant changes in energy levels since starting the medication. He mentions a recent heart catheterization and improvement in kidney function.     Lab Results  Component Value Date   HGBA1C 8.7 (A) 07/04/2024   HGBA1C 8.9 (A) 04/01/2024   HGBA1C 9.9 (A) 11/30/2023   Lab Results  Component Value Date   NA 140 06/22/2024   K 4.1 06/22/2024   CREATININE 1.16 06/22/2024   GFRNONAA >60 06/22/2024   GLUCOSE 95 06/22/2024   Lab Results  Component Value  Date   CHOL 106 03/24/2024   HDL 33 (L) 03/24/2024   LDLCALC 47 03/24/2024   TRIG 148 03/24/2024   CHOLHDL 3.2 03/24/2024     Medications: Outpatient Medications Prior to Visit  Medication Sig   ACCU-CHEK AVIVA PLUS test strip USE AS INSTRUCTED TO CHECK BLOOD SUGAR ONCE DAILY FOR TYPE 2  DIABETES   allopurinol  (ZYLOPRIM ) 100 MG tablet Take 1 tablet by mouth once daily   aspirin  EC 81 MG tablet Take 1 tablet (81 mg total) by mouth daily.   Blood Glucose Monitoring Suppl (ACCU-CHEK AVIVA PLUS) w/Device KIT USE TO CHECK BLOOD SUGAR  DAILY AS DIRECTED   Blood Glucose Monitoring Suppl (ACCU-CHEK GUIDE) w/Device KIT Use to check blood sugars up to 3 times a day. Dx code E11.29   clopidogrel  (PLAVIX ) 75 MG tablet Take 1 tablet (75 mg total) by mouth daily with breakfast.   gabapentin  (NEURONTIN ) 300 MG capsule TAKE 1 CAPSULE BY MOUTH AT BEDTIME FOR  NEUROPATHY   glipiZIDE  (GLUCOTROL ) 5 MG tablet TAKE 1 TABLET BY MOUTH TWICE DAILY BEFORE A MEAL   glucose blood (ACCU-CHEK GUIDE TEST) test strip Use as instructed to check blood sugars up to 3 times a day. Dx code E11.29   Insulin  Pen Needle (PEN NEEDLES) 32G X 4 MM MISC Use to inject insulin  daily as prescribed. Dx code E11.29   isosorbide  mononitrate (IMDUR ) 30 MG 24 hr tablet Take 1 tablet (30 mg total) by mouth daily.  ketoconazole  (NIZORAL ) 2 % shampoo SHAMPOO TOPICALLY TWICE WEEKLY   levothyroxine  (SYNTHROID ) 25 MCG tablet Take 1 tablet (25 mcg total) by mouth daily.   lisinopril  (ZESTRIL ) 40 MG tablet Take 1 tablet (40 mg total) by mouth daily.   nitroGLYCERIN  (NITROSTAT ) 0.4 MG SL tablet DISSOLVE ONE TABLET UNDER THE TONGUE EVERY 5 MINUTES AS NEEDED FOR CHEST PAIN.  DO NOT EXCEED A TOTAL OF 3 DOSES IN 15 MINUTES   rosuvastatin  (CRESTOR ) 10 MG tablet Take 1 tablet by mouth once daily   SitaGLIPtin-MetFORMIN  HCl (JANUMET  XR) 50-1000 MG TB24 Take 1 tablet by mouth in the morning and at bedtime.   tamsulosin  (FLOMAX ) 0.4 MG CAPS capsule Take 1  capsule (0.4 mg total) by mouth daily after supper.   [DISCONTINUED] Continuous Glucose Receiver (FREESTYLE LIBRE 14 DAY READER) DEVI Use to check blood sugar via CGM.   [DISCONTINUED] Continuous Glucose Receiver (FREESTYLE LIBRE 3 READER) DEVI Use to check blood sugars continuously. Dx code E11.29   [DISCONTINUED] Continuous Glucose Sensor (FREESTYLE LIBRE 3 PLUS SENSOR) MISC Change sensor every 15 days.   insulin  glargine (LANTUS  SOLOSTAR) 100 UNIT/ML Solostar Pen Inject 34 units into the skin each morning. Increase by 2 units every 2 days for fasting blood sugar >150 mg/dL. Max dose of 44 units daily. (Patient taking differently: Inject 30 units into the skin each morning. Increase by 2 units every 2 days for fasting blood sugar >150 mg/dL. Max dose of 44 units daily.)   No facility-administered medications prior to visit.   Review of Systems  Constitutional:  Negative for appetite change, chills and fever.  Respiratory:  Negative for chest tightness, shortness of breath and wheezing.   Cardiovascular:  Negative for chest pain and palpitations.  Gastrointestinal:  Negative for abdominal pain, nausea and vomiting.       Objective    BP (!) 142/72 (BP Location: Left Arm, Patient Position: Sitting, Cuff Size: Normal)   Pulse 63   Wt 225 lb 9.6 oz (102.3 kg)   SpO2 100%   BMI 27.46 kg/m   Physical Exam   General appearance: Well developed, well nourished male, cooperative and in no acute distress Head: Normocephalic, without obvious abnormality, atraumatic Respiratory: Respirations even and unlabored, normal respiratory rate Extremities: All extremities are intact.  Skin: Skin color, texture, turgor normal. No rashes seen  Psych: Appropriate mood and affect. Neurologic: Mental status: Alert, oriented to person, place, and time, thought content appropriate.   Results for orders placed or performed in visit on 07/04/24  POCT glycosylated hemoglobin (Hb A1C)  Result Value Ref Range    Hemoglobin A1C 8.7 (A) 4.0 - 5.6 %   Est. average glucose Bld gHb Est-mCnc 203       Assessment & Plan       Type 2 diabetes mellitus Discontinued Farxiga  due to urinary symptoms and cost. Blood glucose averaging high 140s, A1c improved to 8.7%. No hypoglycemia. Prefers finger sticks over CGM. - Increased Lantus  to 36 units. - Adjust Lantus  36-44 units based on glucose levels. - Continue finger sticks for monitoring.  Hypothyroidism Thyroid function requires re-evaluation for dosing. - Ordered thyroid function tests. - Adjust medication based on results.  General Health Maintenance Due for tetanus booster, insurance covers at pharmacy. - Advised to obtain tetanus booster at pharmacy.     Return in about 4 months (around 11/02/2024) for Diabetes.     Nancyann Perry, MD  [facility] [phone] (phone) [phone] (fax)  [facility]

## 2024-07-04 NOTE — Progress Notes (Unsigned)
 Cardiology Office Note:  .   Date:  07/06/2024  ID:  Stuart Henry, DOB 02/06/1941, MRN [id number] PCP: Stuart Nancyann BRAVO, MD  [facility] Providers Cardiologist:  Lonni Hanson, MD     History of Present Illness: .   Stuart Henry is a 83 y.o. male with history of coronary artery disease status post CABG (09/2016) complicated by postoperative atrial fibrillation and recent PCI to SVG-diagonal (06/2024), ischemic cardiomyopathy (LVEF 50-55% by echo in 06/2024), type 2 diabetes mellitus, and hypertension, who presents for follow-up of coronary artery disease with stable angina.  I last saw mid November, at which time he complained of continued fatigue and weakness concerning for his anginal equivalent.  He was very worried about his abnormal PET/CT findings and had become afraid to exercise and play golf.  We elected to proceed with catheterization, which showed high-grade disease involving SVG-D1 and occlusion of sequential SVG-superior and inferior branches of ramus intermedius.  He underwent successful PCI to SVG-D1.  Today, [name] reports that he may be feeling a little bit better with improved energy following PCI to SVG-D1 last month.  However, he still does not feel back to his usual self from a year or two ago.  He denies any chest pain, shortness of breath, lightheadedness, and edema.  He is tolerating aspirin  and clopidogrel  well without bleeding.  ROS: See HPI  Studies Reviewed: SABRA   EKG Interpretation Date/Time:  Wednesday July 06 2024 10:41:20 EST Ventricular Rate:  63 PR Interval:  344 QRS Duration:  92 QT Interval:  436 QTC Calculation: 446 R Axis:   -31  Text Interpretation: Sinus rhythm with sinus arrhythmia with 1st degree A-V block Left axis deviation Inferior infarct (cited on or before 22-Jun-2024) Cannot rule out Anterior infarct (cited on or before 22-Jun-2024) T wave abnormality, consider lateral ischemia Abnormal ECG When compared with ECG of  22-Jun-2024 05:49, Sinus rhythm with 1st degree A-V block has replaced Mobitz I 2-degree AV block (Wenckebach block) Confirmed by Jerrilynn Mikowski ([id number]) on 07/06/2024 1:08:33 PM    TTE (06/21/2024): Normal LV size with mild LVH.  LVEF 50-55% with mild anterior and anteroseptal hypokinesis.  Indeterminate diastolic parameters.  Normal RV size with mildly reduced function.  Mild left atrial enlargement.  Mild-moderate MR.  Aortic sclerosis without stenosis.  Normal CVP.  LHC/PCI (06/21/2024): Severe native left coronary artery disease with chronic total occlusion of ostial LAD as well as 90% ostial ramus intermedius and ostial LCx stenoses.  Right coronary demonstrates mild, nonobstructive coronary artery disease Widely patent LIMA-LAD. Patent SVG-D1 with sequential 80-90% proximal and mid graft stenoses. Chronically occluded sequential SVG-medial ramus intermedius-lateral ramus intermedius. Upper normal left ventricular filling pressure (LVEDP 15 mmHg). Successful PCI to SVG-D1 using Onyx Frontier 2.5 x 38 mm drug-eluting stent with 0% residual stenosis and TIMI-3 flow.  Diagnostic Dominance: Right  Intervention   Risk Assessment/Calculations:     HYPERTENSION CONTROL Vitals:   07/06/24 1034 07/06/24 1105  BP: (!) 150/70 (!) 156/74    The patient's blood pressure is elevated above target today.  In order to address the patient's elevated BP:           Physical Exam:   VS:  BP (!) 156/74 (BP Location: Left Leg, Cuff Size: Normal)   Pulse 63 Comment: 65 oximeter  Ht 6' 3 (1.905 m)   Wt 223 lb 9.6 oz (101.4 kg)   SpO2 97%   BMI 27.95 kg/m    Wt Readings from Last  3 Encounters:  07/06/24 223 lb 9.6 oz (101.4 kg)  07/04/24 225 lb 9.6 oz (102.3 kg)  06/21/24 221 lb (100.2 kg)    General:  NAD. Neck: No JVD or HJR. Lungs: Clear to auscultation bilaterally without wheezes or crackles. Heart: Regular rate and rhythm without murmurs, rubs, or gallops. Abdomen: Soft, nontender,  nondistended. Extremities: No lower extremity edema.  Left radial arteriotomy site without hematoma.  2+ radial pulses bilaterally.  ASSESSMENT AND PLAN: .    Coronary artery disease with stable angina: Mr. Wehmeyer denies any chest pain though fatigue has always been his anginal equivalent.  He has noticed mild improvement in his energy following PCI to SVG-D1 last month.  However, he still does not have the energy that he had a year or 2 ago.  I suspect that severe disease involving his ramus intermedius and LCx territory is contributing to this.  We have discussed continued medical therapy versus attempted PCI to the heavily calcified ostial ramus intermedius; [name] wishes to move forward with PCI.  We will plan to do this at [facility] so that we have atherectomy and lithotripsy available, though given relatively small size and disease involving the branches of the ramus intermedius, I would favor avoiding atherectomy if possible to minimize risk for complications.  He will need to remain on dual antiplatelet therapy with aspirin  and clopidogrel  for at least 6 months from his most recent intervention.  Continue antianginal therapy with isosorbide  mononitrate 30 mg daily as well as secondary prevention with rosuvastatin  10 mg daily.  We will repeat a CBC and BMP today in anticipation of his PCI on 07/18/2024.  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  Some of Mr. Lindblad fatigue could be related to conduction disease, especially if he is having longer periods of Mobitz type I second-degree AV block.  We have agreed to proceed with catheterization/PCI of the ramus intermedius, as detailed above.  He may benefit from ambulatory cardiac monitoring afterwards to better assess his conduction disease and determine if he may need permanent pacemaker placement.  No indication for pacing at this time.  Hypertension: Blood pressure suboptimally controlled  today.  However, with upcoming catheterization, we will defer medication changes at this time.  Hyperlipidemia associated with type 2 diabetes mellitus: Diabetes remains suboptimally controlled and is likely driving progression of his ischemic heart disease.  I encouraged Mr. Sumlin to remain active and keep working on his diet to help improve his glycemic control.  He should also continue to follow closely with Dr. Gasper and his diabetes coordinator.   Cardiac Rehabilitation Eligibility Assessment      Informed Consent   Shared Decision Making/Informed Consent The risks [stroke (1 in 1000), death (1 in 1000), kidney failure [usually temporary] (1 in 500), bleeding (1 in 200), allergic reaction [possibly serious] (1 in 200)], benefits (diagnostic support and management of coronary artery disease) and alternatives of a cardiac catheterization were discussed in detail with Mr. Mah and he is willing to proceed.     Dispo: Return to clinic approximately 2 weeks after PCI.  Signed, Lonni Hanson, MD

## 2024-07-04 NOTE — Patient Instructions (Signed)
Please review the attached list of medications and notify my office if there are any errors.   You are due for a Tdap (tetanus-diptheria-pertussis vaccine) which protects you from tetanus and whooping cough. Please check with your insurance plan or pharmacy regarding coverage for this vaccine.   

## 2024-07-05 ENCOUNTER — Ambulatory Visit: Payer: Self-pay | Admitting: Family Medicine

## 2024-07-05 DIAGNOSIS — R7989 Other specified abnormal findings of blood chemistry: Secondary | ICD-10-CM

## 2024-07-05 LAB — MICROALBUMIN / CREATININE URINE RATIO
Creatinine, Urine: 103.2 mg/dL
Microalb/Creat Ratio: 22 mg/g{creat} (ref 0–29)
Microalbumin, Urine: 22.3 ug/mL

## 2024-07-05 LAB — TSH+FREE T4
Free T4: 0.96 ng/dL (ref 0.82–1.77)
TSH: 5.86 u[IU]/mL — ABNORMAL HIGH (ref 0.450–4.500)

## 2024-07-05 MED ORDER — LEVOTHYROXINE SODIUM 50 MCG PO TABS: 50.0000 ug | ORAL_TABLET | Freq: Every day | ORAL | 3 refills | Status: AC

## 2024-07-06 ENCOUNTER — Ambulatory Visit: Attending: Internal Medicine | Admitting: Internal Medicine

## 2024-07-06 ENCOUNTER — Encounter: Payer: Self-pay | Admitting: Internal Medicine

## 2024-07-06 VITALS — BP 156/74 | HR 63 | Ht 75.0 in | Wt 223.6 lb

## 2024-07-06 DIAGNOSIS — I10 Essential (primary) hypertension: Secondary | ICD-10-CM | POA: Diagnosis not present

## 2024-07-06 DIAGNOSIS — E1169 Type 2 diabetes mellitus with other specified complication: Secondary | ICD-10-CM

## 2024-07-06 DIAGNOSIS — E785 Hyperlipidemia, unspecified: Secondary | ICD-10-CM

## 2024-07-06 DIAGNOSIS — I25118 Atherosclerotic heart disease of native coronary artery with other forms of angina pectoris: Secondary | ICD-10-CM | POA: Diagnosis not present

## 2024-07-06 DIAGNOSIS — I440 Atrioventricular block, first degree: Secondary | ICD-10-CM | POA: Diagnosis not present

## 2024-07-06 DIAGNOSIS — I441 Atrioventricular block, second degree: Secondary | ICD-10-CM | POA: Insufficient documentation

## 2024-07-06 NOTE — Patient Instructions (Signed)
 Medication Instructions:  Your physician recommends that you continue on your current medications as directed. Please refer to the Current Medication list given to you today.    *If you need a refill on your cardiac medications before your next appointment, please call your pharmacy*  Lab Work: Your provider would like for you to have following labs drawn today CBC, BMP.     Testing/Procedures:  [place] [facility] [place] [place] [address] [place] [address] Dept: [phone] Loc: [phone]  Stuart Henry  07/06/2024  You are scheduled for a Cardiac Catheterization on Monday, December 15 with Dr. Lonni End.  1. Please arrive at the [facility] (Main Entrance A) at [facility]: [address] [place], [place] [address] at 8:30 AM (This time is 2 hour(s) before your procedure to ensure your preparation).   Free valet parking service is available. You will check in at ADMITTING. The support person will be asked to wait in the waiting room.  It is OK to have someone drop you off and come back when you are ready to be discharged.    Special note: Every effort is made to have your procedure done on time. Please understand that emergencies sometimes delay scheduled procedures.  2. Diet: Nothing to eat after midnight.   3. Hydration: You need to be well hydrated before your procedure. On December 15, you may drink approved liquids (see below) until 2 hours before the procedure, with 16 oz of water  as your last intake.   List of approved liquids water , clear juice, clear tea, black coffee, fruit juices, non-citric and without pulp, carbonated beverages, Gatorade, Kool -Aid, plain Jello-O and plain ice popsicles.  4. Labs: You will need to have blood drawn today  5. Medication instructions in preparation for your procedure:   Contrast Allergy: No  Hold Morning of  procedure: Glipizide   Insulin  Janumet   Hold 48 hours after procedure Janumet   On the morning of your procedure, take your Aspirin  81 mg and Plavix /Clopidogrel  and any morning medicines NOT listed above.  You may use sips of water .  6. Plan to go home the same day, you will only stay overnight if medically necessary. 7. Bring a current list of your medications and current insurance cards. 8. You MUST have a responsible person to drive you home. 9. Someone MUST be with you the first 24 hours after you arrive home or your discharge will be delayed. 10. Please wear clothes that are easy to get on and off and wear slip-on shoes.  Thank you for allowing us  to care for you!   -- [facility]   Follow-Up: At [facility], you and your health needs are our priority.  As part of our continuing mission to provide you with exceptional heart care, our providers are all part of one team.  This team includes your primary Cardiologist (physician) and Advanced Practice Providers or APPs (Physician Assistants and Nurse Practitioners) who all work together to provide you with the care you need, when you need it.  Your next appointment:   2 week(s) after 07/18/24  Provider:   You may see Lonni Hanson, MD or one of the following Advanced Practice Providers on your designated Care Team:   Lonni Meager, NP Lesley Maffucci, PA-C Bernardino Bring, PA-C Cadence [place], PA-C Tylene Lunch, NP Barnie Hila, NP

## 2024-07-07 ENCOUNTER — Ambulatory Visit: Payer: Self-pay | Admitting: Internal Medicine

## 2024-07-07 LAB — CBC
Hematocrit: 40.8 % (ref 37.5–51.0)
Hemoglobin: 13.4 g/dL (ref 13.0–17.7)
MCH: 30 pg (ref 26.6–33.0)
MCHC: 32.8 g/dL (ref 31.5–35.7)
MCV: 92 fL (ref 79–97)
Platelets: 170 x10E3/uL (ref 150–450)
RBC: 4.46 x10E6/uL (ref 4.14–5.80)
RDW: 12.8 % (ref 11.6–15.4)
WBC: 5.4 x10E3/uL (ref 3.4–10.8)

## 2024-07-07 LAB — BASIC METABOLIC PANEL WITH GFR
BUN/Creatinine Ratio: 14 (ref 10–24)
BUN: 17 mg/dL (ref 8–27)
CO2: 23 mmol/L (ref 20–29)
Calcium: 9.3 mg/dL (ref 8.6–10.2)
Chloride: 101 mmol/L (ref 96–106)
Creatinine, Ser: 1.23 mg/dL (ref 0.76–1.27)
Glucose: 243 mg/dL — ABNORMAL HIGH (ref 70–99)
Potassium: 4.8 mmol/L (ref 3.5–5.2)
Sodium: 139 mmol/L (ref 134–144)
eGFR: 59 mL/min/1.73 — ABNORMAL LOW (ref >59)

## 2024-07-07 NOTE — Addendum Note (Signed)
 Addended by: Trent Gabler A on: 07/07/2024 07:16 AM   Modules accepted: Orders

## 2024-07-09 ENCOUNTER — Other Ambulatory Visit: Payer: Self-pay | Admitting: Internal Medicine

## 2024-07-14 ENCOUNTER — Telehealth: Payer: Self-pay | Admitting: *Deleted

## 2024-07-14 NOTE — Telephone Encounter (Signed)
 Coronary Stent scheduled at [facility] for: Monday July 18, 2024 10:30 AM Arrival time [facility] Main Entrance A at: 8:30 AM  Diet: -Nothing to eat after midnight.  Hydration: -May drink clear liquids until 2 hours before the procedure.  Approved liquids: Water , clear tea, black coffee, fruit juices-non-citric and without pulp,Gatorade, plain Jello/popsicles.   -Please drink 16 oz of water  2 hours before procedure.  Medication instructions: -Hold:  Insulin /Glipizide -AM of procedure  Janumet -day of procedure and 48 hours after procedure -Other usual morning medications can be taken including aspirin  81 mg and Plavix  75 mg.  Plan to go home the same day, you will only stay overnight if medically necessary.  You must have responsible adult to drive you home.  Someone must be with you the first 24 hours after you arrive home.  Reviewed procedure instructions with patient.

## 2024-07-18 ENCOUNTER — Other Ambulatory Visit: Payer: Self-pay

## 2024-07-18 ENCOUNTER — Encounter (HOSPITAL_COMMUNITY): Admission: RE | Disposition: A | Payer: Self-pay | Attending: Internal Medicine

## 2024-07-18 ENCOUNTER — Observation Stay (HOSPITAL_COMMUNITY)
Admission: RE | Admit: 2024-07-18 | Discharge: 2024-07-19 | Disposition: A | Attending: Internal Medicine | Admitting: Internal Medicine

## 2024-07-18 ENCOUNTER — Encounter (HOSPITAL_COMMUNITY): Payer: Self-pay | Admitting: Internal Medicine

## 2024-07-18 DIAGNOSIS — I10 Essential (primary) hypertension: Secondary | ICD-10-CM | POA: Diagnosis not present

## 2024-07-18 DIAGNOSIS — Z79899 Other long term (current) drug therapy: Secondary | ICD-10-CM | POA: Diagnosis not present

## 2024-07-18 DIAGNOSIS — E119 Type 2 diabetes mellitus without complications: Secondary | ICD-10-CM | POA: Diagnosis not present

## 2024-07-18 DIAGNOSIS — I441 Atrioventricular block, second degree: Secondary | ICD-10-CM | POA: Diagnosis not present

## 2024-07-18 DIAGNOSIS — I25118 Atherosclerotic heart disease of native coronary artery with other forms of angina pectoris: Secondary | ICD-10-CM

## 2024-07-18 DIAGNOSIS — I25119 Atherosclerotic heart disease of native coronary artery with unspecified angina pectoris: Secondary | ICD-10-CM | POA: Diagnosis present

## 2024-07-18 DIAGNOSIS — Z955 Presence of coronary angioplasty implant and graft: Principal | ICD-10-CM

## 2024-07-18 DIAGNOSIS — Z794 Long term (current) use of insulin: Secondary | ICD-10-CM | POA: Diagnosis not present

## 2024-07-18 DIAGNOSIS — R809 Proteinuria, unspecified: Secondary | ICD-10-CM

## 2024-07-18 DIAGNOSIS — E785 Hyperlipidemia, unspecified: Secondary | ICD-10-CM | POA: Diagnosis not present

## 2024-07-18 HISTORY — PX: LEFT HEART CATH AND CORS/GRAFTS ANGIOGRAPHY: CATH118250

## 2024-07-18 HISTORY — PX: CORONARY STENT INTERVENTION: CATH118234

## 2024-07-18 LAB — POCT ACTIVATED CLOTTING TIME
Activated Clotting Time: 230 s
Activated Clotting Time: 261 s
Activated Clotting Time: 312 s
Activated Clotting Time: 240 s
Activated Clotting Time: 256 s
Activated Clotting Time: 276 s
Activated Clotting Time: 286 s
Activated Clotting Time: 276 s

## 2024-07-18 LAB — GLUCOSE, CAPILLARY
Glucose-Capillary: 234 mg/dL — ABNORMAL HIGH (ref 70–99)
Glucose-Capillary: 197 mg/dL — ABNORMAL HIGH (ref 70–99)
Glucose-Capillary: 314 mg/dL — ABNORMAL HIGH (ref 70–99)

## 2024-07-18 MED ORDER — FENTANYL CITRATE (PF) 100 MCG/2ML IJ SOLN
INTRAMUSCULAR | Status: AC
  Filled 2024-07-18: qty 2

## 2024-07-18 MED ORDER — SODIUM CHLORIDE 0.9% FLUSH: 3.0000 mL | INTRAVENOUS | Status: DC | PRN

## 2024-07-18 MED ORDER — SODIUM CHLORIDE 0.9 % IV SOLN: INTRAVENOUS | Status: AC

## 2024-07-18 MED ORDER — HEPARIN SODIUM (PORCINE) 1000 UNIT/ML IJ SOLN
INTRAMUSCULAR | Status: AC
  Filled 2024-07-18: qty 10

## 2024-07-18 MED ORDER — ASPIRIN 81 MG PO CHEW: 81.0000 mg | CHEWABLE_TABLET | ORAL | Status: DC

## 2024-07-18 MED ORDER — NITROGLYCERIN 1 MG/10 ML FOR IR/CATH LAB
INTRA_ARTERIAL | Status: DC | PRN
  Administered 2024-07-18 (×2): 200 ug via INTRACORONARY

## 2024-07-18 MED ORDER — GABAPENTIN 300 MG PO CAPS
300.0000 mg | ORAL_CAPSULE | Freq: Every day | ORAL | Status: DC
  Administered 2024-07-18: 22:00:00 300 mg via ORAL
  Filled 2024-07-18: qty 1

## 2024-07-18 MED ORDER — NITROGLYCERIN 0.4 MG SL SUBL: 0.4000 mg | SUBLINGUAL_TABLET | SUBLINGUAL | Status: DC | PRN

## 2024-07-18 MED ORDER — ENOXAPARIN SODIUM 40 MG/0.4ML IJ SOSY
40.0000 mg | PREFILLED_SYRINGE | INTRAMUSCULAR | Status: DC
  Administered 2024-07-19: 08:00:00 40 mg via SUBCUTANEOUS
  Filled 2024-07-18: qty 0.4

## 2024-07-18 MED ORDER — CLOPIDOGREL BISULFATE 75 MG PO TABS
75.0000 mg | ORAL_TABLET | Freq: Every day | ORAL | Status: DC
  Administered 2024-07-19: 08:00:00 75 mg via ORAL
  Filled 2024-07-18: qty 1

## 2024-07-18 MED ORDER — VERAPAMIL HCL 2.5 MG/ML IV SOLN
INTRAVENOUS | Status: DC | PRN
  Administered 2024-07-18: 11:00:00 10 mL via INTRA_ARTERIAL

## 2024-07-18 MED ORDER — LISINOPRIL 20 MG PO TABS
20.0000 mg | ORAL_TABLET | Freq: Once | ORAL | Status: AC
  Administered 2024-07-18: 19:00:00 20 mg via ORAL
  Filled 2024-07-18: qty 1

## 2024-07-18 MED ORDER — HEPARIN (PORCINE) IN NACL 1000-0.9 UT/500ML-% IV SOLN
INTRAVENOUS | Status: DC | PRN
  Administered 2024-07-18 (×2): 500 mL

## 2024-07-18 MED ORDER — INSULIN ASPART 100 UNIT/ML IJ SOLN
0.0000 [IU] | Freq: Three times a day (TID) | INTRAMUSCULAR | Status: DC
  Administered 2024-07-19: 08:00:00 5 [IU] via SUBCUTANEOUS
  Filled 2024-07-18: qty 5

## 2024-07-18 MED ORDER — SODIUM CHLORIDE 0.9 % IV SOLN: 250.0000 mL | INTRAVENOUS | Status: DC | PRN

## 2024-07-18 MED ORDER — HYDRALAZINE HCL 20 MG/ML IJ SOLN: 10.0000 mg | INTRAMUSCULAR | Status: AC | PRN

## 2024-07-18 MED ORDER — LIDOCAINE HCL (PF) 1 % IJ SOLN
INTRAMUSCULAR | Status: AC
  Filled 2024-07-18: qty 30

## 2024-07-18 MED ORDER — ALLOPURINOL 100 MG PO TABS
100.0000 mg | ORAL_TABLET | Freq: Every day | ORAL | Status: DC
  Administered 2024-07-18 – 2024-07-19 (×2): 100 mg via ORAL
  Filled 2024-07-18 (×2): qty 1

## 2024-07-18 MED ORDER — FENTANYL CITRATE (PF) 100 MCG/2ML IJ SOLN
INTRAMUSCULAR | Status: DC | PRN
  Administered 2024-07-18 (×2): 25 ug via INTRAVENOUS

## 2024-07-18 MED ORDER — ACETAMINOPHEN 325 MG PO TABS: 650.0000 mg | ORAL_TABLET | ORAL | Status: DC | PRN

## 2024-07-18 MED ORDER — INSULIN GLARGINE-YFGN 100 UNIT/ML ~~LOC~~ SOLN: 34.0000 [IU] | Freq: Every day | SUBCUTANEOUS | Status: DC

## 2024-07-18 MED ORDER — LEVOTHYROXINE SODIUM 50 MCG PO TABS
50.0000 ug | ORAL_TABLET | Freq: Every day | ORAL | Status: DC
  Administered 2024-07-19: 05:00:00 50 ug via ORAL
  Filled 2024-07-18: qty 1

## 2024-07-18 MED ORDER — LISINOPRIL 20 MG PO TABS
40.0000 mg | ORAL_TABLET | Freq: Every day | ORAL | Status: DC
  Administered 2024-07-19: 08:00:00 40 mg via ORAL
  Filled 2024-07-18: qty 2

## 2024-07-18 MED ORDER — ROSUVASTATIN CALCIUM 5 MG PO TABS
10.0000 mg | ORAL_TABLET | Freq: Every day | ORAL | Status: DC
  Administered 2024-07-19: 08:00:00 10 mg via ORAL
  Filled 2024-07-18: qty 2

## 2024-07-18 MED ORDER — SODIUM CHLORIDE 0.9 % WEIGHT BASED INFUSION: 3.0000 mL/kg/h | INTRAVENOUS | Status: DC

## 2024-07-18 MED ORDER — ISOSORBIDE MONONITRATE ER 30 MG PO TB24
30.0000 mg | ORAL_TABLET | Freq: Every day | ORAL | Status: DC
  Administered 2024-07-18 – 2024-07-19 (×2): 30 mg via ORAL
  Filled 2024-07-18 (×2): qty 1

## 2024-07-18 MED ORDER — SODIUM CHLORIDE 0.9 % IV SOLN
INTRAVENOUS | Status: DC | PRN
  Administered 2024-07-18: 11:00:00 250 mL via INTRAVENOUS
  Administered 2024-07-18: 11:00:00 10 mL/h via INTRAVENOUS

## 2024-07-18 MED ORDER — ASPIRIN 81 MG PO TBEC
81.0000 mg | DELAYED_RELEASE_TABLET | Freq: Every day | ORAL | Status: DC
  Administered 2024-07-19: 08:00:00 81 mg via ORAL
  Filled 2024-07-18: qty 1

## 2024-07-18 MED ORDER — CLOPIDOGREL BISULFATE 300 MG PO TABS
ORAL_TABLET | ORAL | Status: DC | PRN
  Administered 2024-07-18: 14:00:00 300 mg via ORAL

## 2024-07-18 MED ORDER — INSULIN ASPART 100 UNIT/ML IJ SOLN
0.0000 [IU] | Freq: Every day | INTRAMUSCULAR | Status: DC
  Administered 2024-07-18: 22:00:00 4 [IU] via SUBCUTANEOUS
  Filled 2024-07-18: qty 4

## 2024-07-18 MED ORDER — NITROGLYCERIN 1 MG/10 ML FOR IR/CATH LAB
INTRA_ARTERIAL | Status: AC
  Filled 2024-07-18: qty 10

## 2024-07-18 MED ORDER — MIDAZOLAM HCL (PF) 2 MG/2ML IJ SOLN
INTRAMUSCULAR | Status: DC | PRN
  Administered 2024-07-18 (×2): 1 mg via INTRAVENOUS

## 2024-07-18 MED ORDER — MIDAZOLAM HCL 2 MG/2ML IJ SOLN
INTRAMUSCULAR | Status: AC
  Filled 2024-07-18: qty 2

## 2024-07-18 MED ORDER — VERAPAMIL HCL 2.5 MG/ML IV SOLN
INTRAVENOUS | Status: AC
  Filled 2024-07-18: qty 2

## 2024-07-18 MED ORDER — ONDANSETRON HCL 4 MG/2ML IJ SOLN: 4.0000 mg | Freq: Four times a day (QID) | INTRAMUSCULAR | Status: DC | PRN

## 2024-07-18 MED ORDER — CLOPIDOGREL BISULFATE 300 MG PO TABS
ORAL_TABLET | ORAL | Status: AC
  Filled 2024-07-18: qty 1

## 2024-07-18 MED ORDER — LIDOCAINE HCL (PF) 1 % IJ SOLN
INTRAMUSCULAR | Status: DC | PRN
  Administered 2024-07-18: 11:00:00 5 mL via INTRADERMAL

## 2024-07-18 MED ORDER — SODIUM CHLORIDE 0.9% FLUSH
3.0000 mL | Freq: Two times a day (BID) | INTRAVENOUS | Status: DC
  Administered 2024-07-18 – 2024-07-19 (×2): 3 mL via INTRAVENOUS

## 2024-07-18 MED ORDER — HEPARIN SODIUM (PORCINE) 1000 UNIT/ML IJ SOLN
INTRAMUSCULAR | Status: DC | PRN
  Administered 2024-07-18 (×2): 2000 [IU] via INTRAVENOUS
  Administered 2024-07-18: 13:00:00 3000 [IU] via INTRAVENOUS
  Administered 2024-07-18: 11:00:00 5000 [IU] via INTRAVENOUS
  Administered 2024-07-18: 12:00:00 3000 [IU] via INTRAVENOUS
  Administered 2024-07-18: 11:00:00 5000 [IU] via INTRAVENOUS
  Administered 2024-07-18 (×2): 2000 [IU] via INTRAVENOUS

## 2024-07-18 MED ORDER — TAMSULOSIN HCL 0.4 MG PO CAPS
0.4000 mg | ORAL_CAPSULE | Freq: Every day | ORAL | Status: DC
  Administered 2024-07-18: 19:00:00 0.4 mg via ORAL
  Filled 2024-07-18: qty 1

## 2024-07-18 MED ORDER — CLOPIDOGREL BISULFATE 75 MG PO TABS: 75.0000 mg | ORAL_TABLET | ORAL | Status: DC

## 2024-07-18 MED ORDER — SODIUM CHLORIDE 0.9 % WEIGHT BASED INFUSION: 1.0000 mL/kg/h | INTRAVENOUS | Status: DC

## 2024-07-18 MED ORDER — INSULIN GLARGINE 100 UNIT/ML ~~LOC~~ SOLN
34.0000 [IU] | Freq: Every day | SUBCUTANEOUS | Status: DC
  Administered 2024-07-19: 08:00:00 34 [IU] via SUBCUTANEOUS
  Filled 2024-07-18: qty 0.34

## 2024-07-18 MED ORDER — IOHEXOL 350 MG/ML SOLN
INTRAVENOUS | Status: DC | PRN
  Administered 2024-07-18: 14:00:00 230 mL

## 2024-07-18 MED ORDER — VERAPAMIL HCL 2.5 MG/ML IV SOLN
INTRAVENOUS | Status: DC | PRN
  Administered 2024-07-18: 11:00:00 2 mg via INTRAVENOUS

## 2024-07-18 NOTE — Interval H&P Note (Signed)
 History and Physical Interval Note:  07/18/2024 10:49 AM  Stuart Henry  has presented today for surgery, with the diagnosis of coronary artery disease with stable angina.  The various methods of treatment have been discussed with the patient and family. After consideration of risks, benefits and other options for treatment, the patient has consented to  Procedures: CORONARY STENT INTERVENTION (N/A) as a surgical intervention.  The patient's history has been reviewed, patient examined, no change in status, stable for surgery.  I have reviewed the patient's chart and labs.  Questions were answered to the patient's satisfaction.    Cath Lab Visit (complete for each Cath Lab visit)  Clinical Evaluation Leading to the Procedure:   ACS: No.  Non-ACS:    Anginal Classification: CCS III  Anti-ischemic medical therapy: Minimal Therapy (1 class of medications)  Non-Invasive Test Results: Intermediate-risk stress test findings: cardiac mortality 1-3%/year  Prior CABG: Previous CABG  Stuart Henry

## 2024-07-18 NOTE — Plan of Care (Signed)
  Problem: Education: Goal: Understanding of CV disease, CV risk reduction, and recovery process will improve Outcome: Progressing Goal: Individualized Educational Video(s) Outcome: Progressing   Problem: Activity: Goal: Ability to return to baseline activity level will improve Outcome: Progressing   Problem: Cardiovascular: Goal: Ability to achieve and maintain adequate cardiovascular perfusion will improve Outcome: Progressing Goal: Vascular access site(s) Level 0-1 will be maintained Outcome: Progressing   Problem: Health Behavior/Discharge Planning: Goal: Ability to safely manage health-related needs after discharge will improve Outcome: Progressing   Problem: Education: Goal: Knowledge of General Education information will improve Description: Including pain rating scale, medication(s)/side effects and non-pharmacologic comfort measures Outcome: Progressing   Problem: Health Behavior/Discharge Planning: Goal: Ability to manage health-related needs will improve Outcome: Progressing   Problem: Clinical Measurements: Goal: Ability to maintain clinical measurements within normal limits will improve Outcome: Progressing Goal: Will remain free from infection Outcome: Progressing Goal: Diagnostic test results will improve Outcome: Progressing Goal: Respiratory complications will improve Outcome: Progressing Goal: Cardiovascular complication will be avoided Outcome: Progressing   Problem: Activity: Goal: Risk for activity intolerance will decrease Outcome: Progressing   Problem: Nutrition: Goal: Adequate nutrition will be maintained Outcome: Progressing   Problem: Coping: Goal: Level of anxiety will decrease Outcome: Progressing   Problem: Elimination: Goal: Will not experience complications related to bowel motility Outcome: Progressing Goal: Will not experience complications related to urinary retention Outcome: Progressing   Problem: Pain Managment: Goal:  General experience of comfort will improve and/or be controlled Outcome: Progressing   Problem: Safety: Goal: Ability to remain free from injury will improve Outcome: Progressing   Problem: Skin Integrity: Goal: Risk for impaired skin integrity will decrease Outcome: Progressing   Problem: Education: Goal: Ability to describe self-care measures that may prevent or decrease complications (Diabetes Survival Skills Education) will improve Outcome: Progressing Goal: Individualized Educational Video(s) Outcome: Progressing   Problem: Coping: Goal: Ability to adjust to condition or change in health will improve Outcome: Progressing   Problem: Fluid Volume: Goal: Ability to maintain a balanced intake and output will improve Outcome: Progressing   Problem: Health Behavior/Discharge Planning: Goal: Ability to identify and utilize available resources and services will improve Outcome: Progressing Goal: Ability to manage health-related needs will improve Outcome: Progressing   Problem: Metabolic: Goal: Ability to maintain appropriate glucose levels will improve Outcome: Progressing   Problem: Nutritional: Goal: Maintenance of adequate nutrition will improve Outcome: Progressing Goal: Progress toward achieving an optimal weight will improve Outcome: Progressing   Problem: Skin Integrity: Goal: Risk for impaired skin integrity will decrease Outcome: Progressing   Problem: Tissue Perfusion: Goal: Adequacy of tissue perfusion will improve Outcome: Progressing

## 2024-07-19 ENCOUNTER — Other Ambulatory Visit (HOSPITAL_COMMUNITY): Payer: Self-pay

## 2024-07-19 ENCOUNTER — Telehealth (HOSPITAL_COMMUNITY): Payer: Self-pay

## 2024-07-19 DIAGNOSIS — I25118 Atherosclerotic heart disease of native coronary artery with other forms of angina pectoris: Secondary | ICD-10-CM | POA: Diagnosis not present

## 2024-07-19 LAB — BASIC METABOLIC PANEL WITH GFR
Anion gap: 8 (ref 5–15)
BUN: 13 mg/dL (ref 8–23)
CO2: 24 mmol/L (ref 22–32)
Calcium: 8.8 mg/dL — ABNORMAL LOW (ref 8.9–10.3)
Chloride: 104 mmol/L (ref 98–111)
Creatinine, Ser: 1.15 mg/dL (ref 0.61–1.24)
GFR, Estimated: >60 mL/min (ref >60)
Glucose, Bld: 226 mg/dL — ABNORMAL HIGH (ref 70–99)
Potassium: 4 mmol/L (ref 3.5–5.1)
Sodium: 136 mmol/L (ref 135–145)

## 2024-07-19 LAB — CBC
HCT: 36.3 % — ABNORMAL LOW (ref 39.0–52.0)
Hemoglobin: 12.4 g/dL — ABNORMAL LOW (ref 13.0–17.0)
MCH: 29.2 pg (ref 26.0–34.0)
MCHC: 34.2 g/dL (ref 30.0–36.0)
MCV: 85.6 fL (ref 80.0–100.0)
Platelets: 162 K/uL (ref 150–400)
RBC: 4.24 MIL/uL (ref 4.22–5.81)
RDW: 12.7 % (ref 11.5–15.5)
WBC: 4.8 K/uL (ref 4.0–10.5)
nRBC: 0 % (ref 0.0–0.2)

## 2024-07-19 LAB — GLUCOSE, CAPILLARY: Glucose-Capillary: 221 mg/dL — ABNORMAL HIGH (ref 70–99)

## 2024-07-19 MED ORDER — ROSUVASTATIN CALCIUM 20 MG PO TABS
20.0000 mg | ORAL_TABLET | Freq: Every day | ORAL | 3 refills | Status: AC
  Filled 2024-07-19: qty 90, 90d supply, fill #0

## 2024-07-19 MED ORDER — JANUMET XR 50-1000 MG PO TB24: 1.0000 | ORAL_TABLET | Freq: Two times a day (BID) | ORAL | Status: DC

## 2024-07-19 NOTE — Progress Notes (Signed)
 CARDIAC REHAB PHASE I   PRE:  Rate/Rhythm: 71 SR  BP:  Supine: 147/75 Sitting:   Standing:    SaO2: 97%  MODE:  Ambulation: 470 ft   POST:  Rate/Rhythm: 86 Sr w/ occasional PVC  BP:  Supine:   Sitting: 148/84  Standing:    SaO2: 98%  800-853 Patient tolerated ambulation well without symptoms. Independent, gait steady. Reviewed restrictions, CP, NTG use, and calling 911, risk factor modification, and activity progression. Discussed heart healthy eating. He and his wife have a good knowledge of his diet and what to eat for his diabetes and heart health. Discussed Phase 2 cardiac rehab. He has participated before, and would not like to participate at this time. Patient verbalizes understanding of instructions given.   Arnoldo CHRISTELLA Gal, MS, ACSM CEP

## 2024-07-19 NOTE — Care Management Obs Status (Signed)
 MEDICARE OBSERVATION STATUS NOTIFICATION   Patient Details  Name: Stuart Henry MRN: [id number] Date of Birth: 1941/01/14   Medicare Observation Status Notification Given:  Yes    [name] 07/19/2024, 9:48 AM

## 2024-07-19 NOTE — TOC CM/SW Note (Signed)
 Transition of Care [facility]) - Inpatient Brief Assessment   Patient Details  Name: Stuart Henry MRN: [id number] Date of Birth: [birth year]  Transition of Care [facility]) CM/SW Contact:    Sudie Erminio Deems, RN Phone Number: 07/19/2024, 10:52 AM   Clinical Narrative: Patient presented for coronary stent. PTA patient was from home with spouse. Patient does not use any DME in the home. Patient has PCP and insurance. No further needs identified at this time.    Transition of Care Asessment: Insurance and Status: Insurance coverage has been reviewed Patient has primary care physician: Yes Home environment has been reviewed: reviewed Prior level of function:: independent Prior/Current Home Services: No current home services Social Drivers of Health Review: SDOH reviewed no interventions necessary Readmission risk has been reviewed: Yes Transition of care needs: no transition of care needs at this time

## 2024-07-19 NOTE — Discharge Summary (Signed)
 Discharge Summary   Patient ID: PHI AVANS MRN: [id number]; DOB: 1941-04-11  Admit date: 07/18/2024 Discharge date: 07/19/2024  PCP:  Gasper Nancyann BRAVO, MD   [facility] Providers Cardiologist:  Lonni Hanson, MD     Discharge Diagnoses  Principal Problem:   Coronary artery disease involving native coronary artery of native heart with angina pectoris   Diagnostic Studies/Procedures   Cath: 07/18/2024    Ost LAD to Prox LAD lesion is 100% stenosed.   Mid LAD lesion is 100% stenosed.   Ost Cx to Prox Cx lesion is 99% stenosed.   Ramus-2 lesion is 70% stenosed.   Origin to Insertion lesion before Ramus  is 100% stenosed.   Lat Ramus lesion is 90% stenosed.   Ramus-1 lesion is 95% stenosed.   Non-stenotic Origin to Prox Graft lesion was previously treated.   Non-stenotic Prox Graft to Mid Graft lesion was previously treated.   A drug-eluting stent was successfully placed using a STENT ONYX FRONTIER [id number].   Post intervention, there is a 15% residual stenosis.   LIMA and is normal in caliber.   The graft exhibits no disease.   LV end diastolic pressure is normal.   There is no aortic valve stenosis.   In the absence of any other complications or medical issues, we expect the patient to be ready for discharge from an interventional cardiology perspective on 07/19/2024.   Recommend uninterrupted dual antiplatelet therapy with Aspirin  81mg  daily and Clopidogrel  75mg  daily for a minimum of 6 months (stable ischemic heart disease-Class I recommendation).   Conclusions: Stable appearance of the left coronary artery compared to last catheterization on 06/21/2024 with severe ostial LAD, ramus intermedius, and LCx disease.  RCA was not engaged on today's study. Widely patent SVG-D1 with patent stent placed last month. Normal left ventricular filling pressure (LVEDP 8 mmHg). Successful PCI to ostial ramus intermedius using intravascular lithotripsy with placement of  Onyx Frontier 3.0 x 12 mm drug eluting stent with 10-20% residual stenosis and TIMI-3 flow.   Recommendations: Overnight observation and post-catheterization hydration. Continue DAPT with aspirin  and clopidogrel  for at least 6 months. Aggressive secondary prevention.   Lonni Hanson, MD [facility]   Diagnostic Dominance: Right  Intervention   _____________   History of Present Illness   Stuart Henry is a 83 y.o. male with history of coronary artery disease status post CABG (09/2016) complicated by postoperative atrial fibrillation and recent PCI to SVG-diagonal (06/2024), ischemic cardiomyopathy (LVEF 50-55% by echo in 06/2024), type 2 diabetes mellitus, and hypertension, who presented for follow-up of coronary artery disease with stable angina with Dr. Hanson in the office on 12/3. He was seen mid November, at which time he complained of continued fatigue and weakness concerning for his anginal equivalent.  He was very worried about his abnormal PET/CT findings and had become afraid to exercise and play golf.  Dr. Hanson elected to proceed with catheterization, which showed high-grade disease involving SVG-D1 and occlusion of sequential SVG-superior and inferior branches of ramus intermedius.  He underwent successful PCI to SVG-D1.   When seen back in the office on 12/3 he reported that he may be feeling a little bit better with improved energy following PCI to SVG-D1 last month.  However, he still did not feel back to his usual self from a year or two ago.  He denied any chest pain, shortness of breath, lightheadedness, and edema.  He was tolerating aspirin  and clopidogrel  well without bleeding. Given his residual  disease in the ramus and LCX he was set up for outpatient cardiac cath.    Hospital Course    Underwent outpatient cardiac cath 12/15 with stable appearance of LAD compared to cath 06/2024 with successful PCI/DES to oRI with intravascular lithotripsy. Recommendations for DAPT  with ASA/plavix  for at least 6 months. No issues noted overnight. Worked well with cardiac rehab, no chest pain overnight. Increased crestor  from 10->20mg  daily.   General: Well developed, well nourished, male appearing in no acute distress. Head: Normocephalic, atraumatic.  Neck: Supple without bruits, JVD. Lungs:  Resp regular and unlabored, CTA. Heart: RRR, S1, S2, no S3, S4, or murmur; no rub. Abdomen: Soft, non-tender, non-distended with normoactive bowel sounds. Extremities: No clubbing, cyanosis edema. Distal pedal pulses are 2+ bilaterally. Right radial cath site stable without bruising or hematoma Neuro: Alert and oriented X 3. Moves all extremities spontaneously. Psych: Normal affect.  Patient was seen by myself and Dr. Ladona and deemed stable for discharge home. Follow up arranged in the office.  Did the patient have an acute coronary syndrome (MI, NSTEMI, STEMI, etc) this admission?:  Yes                               AHA/ACC ACS Clinical Performance & Quality Measures: Aspirin  prescribed? - Yes ADP Receptor Inhibitor (Plavix /Clopidogrel , Brilinta/Ticagrelor or Effient/Prasugrel) prescribed (includes medically managed patients)? - Yes Beta Blocker prescribed? - No. The patient has an EF >/= 50%. Based upon the [facility] Study pub in Apr 2024, there is no benefit in patients with preserved EF post MI. High Intensity Statin (Lipitor 40-80mg  or Crestor  20-40mg ) prescribed? - Yes EF assessed during THIS hospitalization? - No - recent outpatient echo For EF <40%, was ACEI/ARB prescribed? - Not Applicable (EF >/= 40%) For EF <40%, Aldosterone Antagonist (Spironolactone or Eplerenone) prescribed? - Not Applicable (EF >/= 40%) Cardiac Rehab Phase II ordered (including medically managed patients)? - Yes       The patient will be scheduled for a TOC follow up appointment in 14 days.  A message has been sent to the [facility] and Scheduling Pool at the office where the patient should be  seen for follow up.  _____________  Discharge Vitals Blood pressure 139/77, pulse (!) 58, temperature 97.6 F (36.4 C), temperature source Oral, resp. rate 18, height 6' 3 (1.905 m), weight 99.8 kg, SpO2 97%.  Filed Weights   07/18/24 0849  Weight: 99.8 kg    Labs & Radiologic Studies  CBC Recent Labs    07/19/24 0502  WBC 4.8  HGB 12.4*  HCT 36.3*  MCV 85.6  PLT 162   Basic Metabolic Panel Recent Labs    87/83/74 0502  NA 136  K 4.0  CL 104  CO2 24  GLUCOSE 226*  BUN 13  CREATININE 1.15  CALCIUM  8.8*   Liver Function Tests No results for input(s): AST, ALT, ALKPHOS, BILITOT, PROT, ALBUMIN  in the last 72 hours. No results for input(s): LIPASE, AMYLASE in the last 72 hours. High Sensitivity Troponin:   No results for input(s): TROPONINIHS in the last 720 hours.  No results for input(s): TRNPT in the last 720 hours.  BNP Invalid input(s): POCBNP No results for input(s): PROBNP in the last 72 hours.  No results for input(s): BNP in the last 72 hours.  D-Dimer No results for input(s): DDIMER in the last 72 hours. Hemoglobin A1C No results for input(s): HGBA1C in the last  72 hours. Fasting Lipid Panel No results for input(s): CHOL, HDL, LDLCALC, TRIG, CHOLHDL, LDLDIRECT in the last 72 hours. Lipoprotein (a)  Date/Time Value Ref Range Status  06/22/2024 05:06 AM 141.8 (H) <75.0 nmol/L Final    Comment:    (NOTE) This test was developed and its performance characteristics determined by [facility]. It has not been cleared or approved by the Food and Drug Administration. Note:  Values greater than or equal to 75.0 nmol/L may       indicate an independent risk factor for CHD,       but must be evaluated with caution when applied       to non-Caucasian populations due to the       influence of genetic factors on Lp(a) across       ethnicities. Performed At: [facility] [address] [place], [place]  [id number] Jennette Shorter MD Ey:[phone]     Thyroid Function Tests No results for input(s): TSH, T4TOTAL, T3FREE, THYROIDAB in the last 72 hours.  Invalid input(s): FREET3 _____________  CARDIAC CATHETERIZATION Result Date: 07/18/2024   Ost LAD to Prox LAD lesion is 100% stenosed.   Mid LAD lesion is 100% stenosed.   Ost Cx to Prox Cx lesion is 99% stenosed.   Ramus-2 lesion is 70% stenosed.   Origin to Insertion lesion before Ramus  is 100% stenosed.   Lat Ramus lesion is 90% stenosed.   Ramus-1 lesion is 95% stenosed.   Non-stenotic Origin to Prox Graft lesion was previously treated.   Non-stenotic Prox Graft to Mid Graft lesion was previously treated.   A drug-eluting stent was successfully placed using a STENT ONYX FRONTIER [id number].   Post intervention, there is a 15% residual stenosis.   LIMA and is normal in caliber.   The graft exhibits no disease.   LV end diastolic pressure is normal.   There is no aortic valve stenosis.   In the absence of any other complications or medical issues, we expect the patient to be ready for discharge from an interventional cardiology perspective on 07/19/2024.   Recommend uninterrupted dual antiplatelet therapy with Aspirin  81mg  daily and Clopidogrel  75mg  daily for a minimum of 6 months (stable ischemic heart disease-Class I recommendation). Conclusions: Stable appearance of the left coronary artery compared to last catheterization on 06/21/2024 with severe ostial LAD, ramus intermedius, and LCx disease.  RCA was not engaged on today's study. Widely patent SVG-D1 with patent stent placed last month. Normal left ventricular filling pressure (LVEDP 8 mmHg). Successful PCI to ostial ramus intermedius using intravascular lithotripsy with placement of Onyx Frontier 3.0 x 12 mm drug eluting stent with 10-20% residual stenosis and TIMI-3 flow. Recommendations: Overnight observation and post-catheterization hydration. Continue DAPT with aspirin  and clopidogrel   for at least 6 months. Aggressive secondary prevention. Lonni Hanson, MD [facility]  ECHOCARDIOGRAM COMPLETE Result Date: 06/22/2024    ECHOCARDIOGRAM REPORT   Patient Name:   Stuart Henry Date of Exam: 06/21/2024 Medical Rec #:  [id number]        Height:       76.0 in Accession #:    [id number]       Weight:       221.0 lb Date of Birth:  1941-03-08        BSA:          2.311 m Patient Age:    83 years         BP:  136/79 mmHg Patient Gender: M                HR:           58 bpm. Exam Location:  [facility] Procedure: 2D Echo, Cardiac Doppler and Color Doppler (Both Spectral and Color            Flow Doppler were utilized during procedure). Indications:     I25.10 CAD  History:         Patient has prior history of Echocardiogram examinations, most                  recent 04/23/2017. CAD; Risk Factors:Diabetes.  Sonographer:     Carl Coma RDCS Referring Phys:  [id number] CHRI[address] END Diagnosing Phys: Timothy Gollan MD IMPRESSIONS  1. Left ventricular ejection fraction, by estimation, is 50 to 55%. The left ventricle has low normal function. The left ventricle demonstrates regional wall motion abnormalities (mild anterior and anteroseptal wall hypokinesis). There is mild left ventricular hypertrophy.  2. Right ventricular systolic function is mildly reduced. The right ventricular size is normal.  3. Left atrial size was mildly dilated.  4. The mitral valve is normal in structure. Mild to moderate mitral valve regurgitation. No evidence of mitral stenosis.  5. The aortic valve is normal in structure. Aortic valve regurgitation is not visualized. Aortic valve sclerosis/calcification is present, without any evidence of aortic stenosis.  6. The inferior vena cava is normal in size with greater than 50% respiratory variability, suggesting right atrial pressure of 3 mmHg. FINDINGS  Left Ventricle: Left ventricular ejection fraction, by estimation, is 50 to 55%. The left ventricle has low normal  function. The left ventricle demonstrates regional wall motion abnormalities. Strain was performed and the global longitudinal strain is indeterminate. The left ventricular internal cavity size was normal in size. There is mild left ventricular hypertrophy. Left ventricular diastolic parameters are indeterminate. Right Ventricle: The right ventricular size is normal. No increase in right ventricular wall thickness. Right ventricular systolic function is mildly reduced. Left Atrium: Left atrial size was mildly dilated. Right Atrium: Right atrial size was normal in size. Pericardium: There is no evidence of pericardial effusion. Mitral Valve: The mitral valve is normal in structure. Mild mitral annular calcification. Mild to moderate mitral valve regurgitation. No evidence of mitral valve stenosis. Tricuspid Valve: The tricuspid valve is normal in structure. Tricuspid valve regurgitation is not demonstrated. No evidence of tricuspid stenosis. Aortic Valve: The aortic valve is normal in structure. Aortic valve regurgitation is not visualized. Aortic valve sclerosis/calcification is present, without any evidence of aortic stenosis. Pulmonic Valve: The pulmonic valve was normal in structure. Pulmonic valve regurgitation is not visualized. No evidence of pulmonic stenosis. Aorta: The aortic root is normal in size and structure. Venous: The inferior vena cava is normal in size with greater than 50% respiratory variability, suggesting right atrial pressure of 3 mmHg. IAS/Shunts: No atrial level shunt detected by color flow Doppler. Additional Comments: 3D was performed not requiring image post processing on an independent workstation and was indeterminate.  LEFT VENTRICLE PLAX 2D LVIDd:         4.30 cm   Diastology LVIDs:         3.00 cm   LV e' medial:    7.02 cm/s LV PW:         1.40 cm   LV E/e' medial:  12.6 LV IVS:        1.20 cm   LV e' lateral:  9.62 cm/s LVOT diam:     2.30 cm   LV E/e' lateral: 9.2 LV SV:          76 LV SV Index:   33 LVOT Area:     4.15 cm  RIGHT VENTRICLE RV Basal diam:  3.90 cm RV S prime:     10.85 cm/s TAPSE (M-mode): 1.1 cm LEFT ATRIUM             Index        RIGHT ATRIUM           Index LA diam:        4.80 cm 2.08 cm/m   RA Area:     15.40 cm LA Vol (A2C):   88.0 ml 38.07 ml/m  RA Volume:   40.40 ml  17.48 ml/m LA Vol (A4C):   60.8 ml 26.31 ml/m LA Biplane Vol: 77.9 ml 33.70 ml/m  AORTIC VALVE LVOT Vmax:   76.80 cm/s LVOT Vmean:  53.100 cm/s LVOT VTI:    0.182 m  AORTA Ao Root diam: 4.00 cm Ao Asc diam:  3.60 cm MITRAL VALVE MV Area (PHT): 3.91 cm    SHUNTS MV Decel Time: 194 msec    Systemic VTI:  0.18 m MV E velocity: 88.80 cm/s  Systemic Diam: 2.30 cm MV A velocity: 69.70 cm/s MV E/A ratio:  1.27 Evalene Lunger MD Electronically signed by Evalene Lunger MD Signature Date/Time: 06/22/2024/6:18:07 AM    Final    CARDIAC CATHETERIZATION Result Date: 06/21/2024 Conclusions: Severe native left coronary artery disease with chronic total occlusion of ostial LAD as well as 90% ostial ramus intermedius and ostial LCx stenoses.  Right coronary demonstrates mild, nonobstructive coronary artery disease Widely patent LIMA-LAD. Patent SVG-D1 with sequential 80-90% proximal and mid graft stenoses. Chronically occluded sequential SVG-medial ramus intermedius-lateral ramus intermedius. Upper normal left ventricular filling pressure (LVEDP 15 mmHg). Successful PCI to SVG-D1 using Onyx Frontier 2.5 x 38 mm drug-eluting stent with 0% residual stenosis and TIMI-3 flow.  Recommendations: Overnight observation. Dual antiplatelet therapy with aspirin  and clopidogrel  for at least 6 months. Obtain echocardiogram. Continue antianginal therapy for residual left coronary artery disease.  Anticipate having patient follow-up in the next 1 to 2 weeks in the clinic to reassess his symptoms and consider staged PCI to ostial ramus intermedius +/- LCx at [facility] (may require coronary atherectomy and/or lithotripsy).  Lonni Hanson, MD [facility]   Disposition Pt is being discharged home today in good condition.  Follow-up Plans & Appointments  Discharge Instructions     Amb Referral to Cardiac Rehabilitation   Complete by: As directed    Patient participated in 2018. Does not plan to attend again.   Diagnosis: Coronary Stents   After initial evaluation and assessments completed: Virtual Based Care may be provided alone or in conjunction with Phase 2 Cardiac Rehab based on patient barriers.: Yes   Intensive Cardiac Rehabilitation (ICR) [facility] location only OR Traditional Cardiac Rehabilitation (TCR) *If criteria for ICR are not met will enroll in TCR [facility] only): Yes   Call MD for:  difficulty breathing, headache or visual disturbances   Complete by: As directed    Call MD for:  persistant dizziness or light-headedness   Complete by: As directed    Call MD for:  redness, tenderness, or signs of infection (pain, swelling, redness, odor or green/yellow discharge around incision site)   Complete by: As directed    Discharge instructions   Complete by: As directed  Radial Site Care Refer to this sheet in the next few weeks. These instructions provide you with information on caring for yourself after your procedure. Your caregiver may also give you more specific instructions. Your treatment has been planned according to current medical practices, but problems sometimes occur. Call your caregiver if you have any problems or questions after your procedure. HOME CARE INSTRUCTIONS You may shower the day after the procedure. Remove the bandage (dressing) and gently wash the site with plain soap and water . Gently pat the site dry.  Do not apply powder or lotion to the site.  Do not submerge the affected site in water  for 3 to 5 days.  Inspect the site at least twice daily.  Do not flex or bend the affected arm for 24 hours.  No lifting over 5 pounds (2.3 kg) for 5 days after your procedure.  Do not drive  home if you are discharged the same day of the procedure. Have someone else drive you.  You may drive 24 hours after the procedure unless otherwise instructed by your caregiver.  What to expect: Any bruising will usually fade within 1 to 2 weeks.  Blood that collects in the tissue (hematoma) may be painful to the touch. It should usually decrease in size and tenderness within 1 to 2 weeks.  SEEK IMMEDIATE MEDICAL CARE IF: You have unusual pain at the radial site.  You have redness, warmth, swelling, or pain at the radial site.  You have drainage (other than a small amount of blood on the dressing).  You have chills.  You have a fever or persistent symptoms for more than 72 hours.  You have a fever and your symptoms suddenly get worse.  Your arm becomes pale, cool, tingly, or numb.  You have heavy bleeding from the site. Hold pressure on the site.   PLEASE DO NOT MISS ANY DOSES OF YOUR PLAVIX !!!!! Also keep a log of you blood pressures and bring back to your follow up appt. Please call the office with any questions.   Patients taking blood thinners should generally stay away from medicines like ibuprofen, Advil, Motrin, naproxen, and Aleve due to risk of stomach bleeding. You may take Tylenol  as directed or talk to your primary doctor about alternatives.    PLEASE ENSURE THAT YOU DO NOT RUN OUT OF YOUR PLAVIX . This medication is very important to remain on for at least 6months. IF you have issues obtaining this medication due to cost please CALL the office 3-5 business days prior to running out in order to prevent missing doses of this medication.   Increase activity slowly   Complete by: As directed        Discharge Medications Allergies as of 07/19/2024       Reactions   Farxiga  [dapagliflozin ]    Recurrent balanitis         Medication List     TAKE these medications    Accu-Chek Aviva Plus test strip Generic drug: glucose blood USE AS INSTRUCTED TO CHECK BLOOD SUGAR  ONCE DAILY FOR TYPE 2  DIABETES   Accu-Chek Guide Test test strip Generic drug: glucose blood Use as instructed to check blood sugars up to 3 times a day. 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   clopidogrel  75 MG tablet Commonly known as: PLAVIX  Take 1 tablet (75 mg total) by mouth daily with breakfast.   gabapentin  300 MG capsule Commonly known as: NEURONTIN  TAKE 1 CAPSULE BY MOUTH AT BEDTIME FOR  NEUROPATHY   glipiZIDE  5 MG tablet Commonly known as: GLUCOTROL  TAKE 1 TABLET BY MOUTH TWICE DAILY BEFORE A MEAL   isosorbide  mononitrate 30 MG 24 hr tablet Commonly known as: IMDUR  Take 1 tablet (30 mg total) by mouth daily.   Janumet  XR 50-1000 MG Tb24 Generic drug: SitaGLIPtin-MetFORMIN  HCl Take 1 tablet by mouth in the morning and at bedtime. Start taking on: July 21, 2024 What changed: These instructions start on July 21, 2024. If you are unsure what to do until then, ask your doctor or other care provider.   ketoconazole  2 % shampoo Commonly known as: NIZORAL  SHAMPOO TOPICALLY TWICE WEEKLY   Lantus  SoloStar 100 UNIT/ML Solostar Pen Generic drug: insulin  glargine Inject 34 units into the skin each morning. Increase up to 44 units a day as needed   levothyroxine  50 MCG tablet Commonly known as: SYNTHROID  Take 1 tablet (50 mcg total) by mouth daily.   lisinopril  40 MG tablet Commonly known as: ZESTRIL  TAKE 1 TABLET BY MOUTH DAILY   nitroGLYCERIN  0.4 MG SL tablet Commonly known as: NITROSTAT  DISSOLVE ONE TABLET UNDER THE TONGUE EVERY 5 MINUTES AS NEEDED FOR CHEST PAIN.  DO NOT EXCEED A TOTAL OF 3 DOSES IN 15 MINUTES   Pen Needles 32G X 4 MM  Misc Use to inject insulin  daily as prescribed. 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 What changed:  medication strength how much to take   tamsulosin  0.4 MG Caps capsule Commonly known as: FLOMAX  Take 1 capsule (0.4 mg total) by mouth daily after supper.         Outstanding Labs/Studies  FLP/LFTs in 8 weeks  Duration of Discharge Encounter: APP Time: 20 minutes   Signed, Manuelita Rummer, NP 07/19/2024, 10:02 AM

## 2024-07-19 NOTE — Telephone Encounter (Signed)
 Pharmacy Patient Advocate Encounter  Insurance verification completed.    The patient is insured through [employer]. Patient has Medicare and is not eligible for a copay card, but may be able to apply for patient assistance or Medicare RX Payment Plan (Patient Must reach out to their plan, if eligible for payment plan), if available.    Ran test claim for Jardiance  10mg  tablet and the current 30 day co-pay is $0.  Ran test claim for Farxiga  10mg  tablet and the current 30 day co-pay is $0.  This test claim was processed through [employer]- copay amounts may vary at other pharmacies due to [employer], or as the patient moves through the different stages of their insurance plan.

## 2024-07-20 ENCOUNTER — Other Ambulatory Visit (HOSPITAL_COMMUNITY): Payer: Self-pay | Admitting: *Deleted

## 2024-07-20 DIAGNOSIS — Z955 Presence of coronary angioplasty implant and graft: Secondary | ICD-10-CM

## 2024-08-03 ENCOUNTER — Ambulatory Visit: Admitting: Internal Medicine

## 2024-08-09 DIAGNOSIS — R809 Proteinuria, unspecified: Secondary | ICD-10-CM

## 2024-08-10 ENCOUNTER — Ambulatory Visit: Admitting: Internal Medicine

## 2024-08-10 ENCOUNTER — Encounter: Payer: Self-pay | Admitting: Internal Medicine

## 2024-08-10 VITALS — BP 134/75 | HR 63 | Ht 76.0 in | Wt 220.6 lb

## 2024-08-10 DIAGNOSIS — E785 Hyperlipidemia, unspecified: Secondary | ICD-10-CM

## 2024-08-10 DIAGNOSIS — I10 Essential (primary) hypertension: Secondary | ICD-10-CM | POA: Diagnosis not present

## 2024-08-10 DIAGNOSIS — I440 Atrioventricular block, first degree: Secondary | ICD-10-CM

## 2024-08-10 DIAGNOSIS — I441 Atrioventricular block, second degree: Secondary | ICD-10-CM

## 2024-08-10 DIAGNOSIS — I25118 Atherosclerotic heart disease of native coronary artery with other forms of angina pectoris: Secondary | ICD-10-CM

## 2024-08-10 DIAGNOSIS — E1169 Type 2 diabetes mellitus with other specified complication: Secondary | ICD-10-CM | POA: Diagnosis not present

## 2024-08-10 NOTE — Patient Instructions (Signed)
 Medication Instructions:  Your physician recommends that you continue on your current medications as directed. Please refer to the Current Medication list given to you today.    *If you need a refill on your cardiac medications before your next appointment, please call your pharmacy*  Lab Work: No test ordered today    Testing/Procedures: No labs ordered today   Follow-Up: At [facility], you and your health needs are our priority.  As part of our continuing mission to provide you with exceptional heart care, our providers are all part of one team.  This team includes your primary Cardiologist (physician) and Advanced Practice Providers or APPs (Physician Assistants and Nurse Practitioners) who all work together to provide you with the care you need, when you need it.  Your next appointment:   4 month(s)  Provider:   You may see Lonni Hanson, MD or one of the following Advanced Practice Providers on your designated Care Team:   Lonni Meager, NP Lesley Maffucci, PA-C Bernardino Bring, PA-C Cadence [place], PA-C Tylene Lunch, NP Barnie Hila, NP

## 2024-08-10 NOTE — Progress Notes (Signed)
 " Cardiology Office Note:  .   Date:  08/10/2024  ID:  XYLER TERPENING, DOB June 29, 1941, MRN [id number] PCP: Gasper Nancyann BRAVO, MD  [facility] Providers Cardiologist:  Lonni Hanson, MD     History of Present Illness: .   Stuart Henry is a 84 y.o. male with history of coronary artery disease status post CABG (09/2016) complicated by postoperative atrial fibrillation and subsequent PCI's to SVG-diagonal (06/2024) and ostial ramus intermedius (07/2024), ischemic cardiomyopathy (LVEF 50-55% by echo in 06/2024), type 2 diabetes mellitus, and hypertension, who presents for follow-up of coronary artery disease.  I last saw him in early December, at which time Mr. Bastos was feeling a little better following his PCI to SVG-D1 though he continued to have some fatigue and concerns about his underlying CAD and graft failure.  We agreed to proceed with PCI to ostial ramus intermedius, which was performed on 07/18/2024 at [facility].  Today, Mr. Cerezo reports that he is feeling a little bit better with more energy than before his PCI's.  He hopes to begin exercising again soon as well as playing golf.  He has not resumed these activities yet because he wants to make sure that his radial arteriotomy site is well-healed.  He denies chest pain, shortness of breath, palpitations, lightheadedness, and edema.  He remains compliant with DAPT, denying bleeding.  ROS: See HPI  Studies Reviewed: SABRA   EKG Interpretation Date/Time:  Wednesday August 10 2024 13:20:49 EST Ventricular Rate:  63 PR Interval:  290 QRS Duration:  94 QT Interval:  434 QTC Calculation: 444 R Axis:   -31  Text Interpretation: Sinus rhythm with 1st degree A-V block with occasional Premature ventricular complexes Left axis deviation Inferior infarct (cited on or before 22-Jun-2024) Abnormal ECG When compared with ECG of 06-Jul-2024 10:41, Premature ventricular complexes are now Present Confirmed by Knowledge Escandon, Lonni [phone]) on  08/10/2024 1:26:37 PM    Risk Assessment/Calculations:             Physical Exam:   VS:  BP 134/75 (BP Location: Left Arm, Patient Position: Sitting, Cuff Size: Normal)   Pulse 63   Ht 6' 4 (1.93 m)   Wt 220 lb 9.6 oz (100.1 kg)   SpO2 96%   BMI 26.85 kg/m    Wt Readings from Last 3 Encounters:  08/10/24 220 lb 9.6 oz (100.1 kg)  07/18/24 220 lb (99.8 kg)  07/06/24 223 lb 9.6 oz (101.4 kg)    General:  NAD. Neck: No JVD or HJR. Lungs: Clear to auscultation bilaterally without wheezes or crackles. Heart: Regular rate and rhythm without murmurs, rubs, or gallops. Abdomen: Soft, nontender, nondistended. Extremities: No lower extremity edema.  Right radial arteriotomy site well-healed.  Right radial pulse 1+.  ASSESSMENT AND PLAN: .    Coronary artery disease with stable angina: Mr. Montz is feeling well without any chest pain or shortness of breath.  His fatigue, which has been his anginal equivalent, has improved with PCI's to SVG-D1 and ramus intermedius.  We will plan to continue a minimum of 6 months of dual antiplatelet therapy with aspirin  and clopidogrel , ideally longer.  Continue isosorbide  mononitrate for antianginal therapy, given that he has small vessel disease that has not been revascularized.  Continue secondary prevention with rosuvastatin  20 mg daily as well.  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  No evidence of second-degree AV block on today's rhythm strip.  Mr.  Madan has been asymptomatic without worsening fatigue or lightheadedness/syncope.  We discussed ambulatory cardiac monitoring but have agreed to defer this given the lack of symptoms.  Continue avoiding AV nodal blocking agents.  He will contact us  if any new symptoms arise.  Hypertension: Blood pressure mildly elevated today.  We have agreed to defer medication changes.  Hyperlipidemia associated with type 2 diabetes mellitus: Continue  rosuvastatin  20 mg daily.  Ongoing management of DM per Dr. Gasper.   Cardiac Rehabilitation Eligibility Assessment  The patient has declined or is not appropriate for cardiac rehabilitation. ([name] does not wish to participate in cardiac rehab)   Dispo: Return to clinic in 4 months.  Signed, Lonni Hanson, MD  "

## 2024-08-17 ENCOUNTER — Other Ambulatory Visit (HOSPITAL_COMMUNITY): Payer: Self-pay

## 2024-08-17 ENCOUNTER — Ambulatory Visit

## 2024-08-17 NOTE — Progress Notes (Unsigned)
 "  S:     Reason for visit: ?  Stuart Henry is a 84 y.o. male with a history of diabetes (type 2), who presents today for a follow up diabetes Face to Face pharmacotherapy visit.? Pertinent PMH also includes gout, CAD s/p CABG x4, Afib, HTN, HFimpEF.  Care Team: Primary Care Provider: Gasper Nancyann BRAVO, MD  Patient received PCI at [facility] on 06/2024 and 07/2024 respectively.   Current diabetes medications include: glipizide  5 mg BID, Lantus  30 units daily, Janumet  50/1000 mg BID Previous diabetes medications include: Ozempic  (cost), Farxiga  (UTI), pioglitazone  Current hypertension medications include: lisinopril  40 mg daily Current hyperlipidemia medications include: rosuvastatin  10 mg daily  Patient reports adherence to taking all medications as prescribed, aside from pioglitazone   Have you been experiencing any side effects to the medications prescribed? Yes - GU infections with Farxiga  Do you have any problems obtaining medications due to transportation or finances? Yes - brand name products, however, does not qualify for PAP based on income Insurance coverage: [facility] [employer] Advantage  Patient denies hypoglycemic events.  Reported home fasting blood sugars: 125-180 mg/dL  Patient reported dietary habits: Eats 3 meals/day Breakfast: blueberries, homemade sausage biscuit, coffee  Lunch: sandwich, hot dog, 12 french fries  Dinner: casseroles  Patient-reported exercise habits: bikes 5-10 miles DM Prevention:  Statin: Taking; moderate intensity.?  History of chronic kidney disease? yes History of albuminuria? yes Last eye exam: Lab Results  Component Value Date   HMDIABEYEEXA No Retinopathy 11/11/2023   Tobacco Use:  Tobacco Use: Medium Risk (08/10/2024)   Patient History    Smoking Tobacco Use: Former    Smokeless Tobacco Use: Never    Passive Exposure: Not on file   O:  Vitals:  Wt Readings from Last 3 Encounters:  08/10/24 220 lb 9.6 oz (100.1 kg)   07/18/24 220 lb (99.8 kg)  07/06/24 223 lb 9.6 oz (101.4 kg)   BP Readings from Last 3 Encounters:  08/10/24 134/75  07/19/24 139/77  07/06/24 (!) 156/74   Pulse Readings from Last 3 Encounters:  08/10/24 63  07/19/24 (!) 58  07/06/24 63     Labs:?  Lab Results  Component Value Date   HGBA1C 8.7 (A) 07/04/2024   HGBA1C 8.9 (A) 04/01/2024   HGBA1C 9.9 (A) 11/30/2023   GLUCOSE 226 (H) 07/19/2024   MICRALBCREAT 22 07/04/2024   MICRALBCREAT 30 (H) 04/27/2023   MICRALBCREAT 50 (H) 11/22/2021   CREATININE 1.15 07/19/2024   CREATININE 1.23 07/06/2024   CREATININE 1.16 06/22/2024    Lab Results  Component Value Date   CHOL 106 03/24/2024   LDLCALC 47 03/24/2024   LDLCALC 30 12/19/2022   LDLCALC 37 11/22/2021   HDL 33 (L) 03/24/2024   TRIG 148 03/24/2024   TRIG 156 (H) 12/19/2022   TRIG 84 11/22/2021   ALT 16 03/24/2024   ALT 24 12/19/2022   AST 13 03/24/2024   AST 20 12/19/2022      Chemistry      Component Value Date/Time   NA 136 07/19/2024 0502   NA 139 07/06/2024 1126   K 4.0 07/19/2024 0502   CL 104 07/19/2024 0502   CO2 24 07/19/2024 0502   BUN 13 07/19/2024 0502   BUN 17 07/06/2024 1126   CREATININE 1.15 07/19/2024 0502   GLU 155 03/10/2014 0000      Component Value Date/Time   CALCIUM  8.8 (L) 07/19/2024 0502   ALKPHOS 42 (L) 03/24/2024 1011   AST 13 03/24/2024  1011   ALT 16 03/24/2024 1011   BILITOT 0.6 03/24/2024 1011       The ASCVD Risk score (Arnett DK, et al., 2019) failed to calculate for the following reasons:   The 2019 ASCVD risk score is only valid for ages 43 to 59   * - Cholesterol units were assumed  Lab Results  Component Value Date   MICRALBCREAT 22 07/04/2024   MICRALBCREAT 30 (H) 04/27/2023   MICRALBCREAT 50 (H) 11/22/2021   MICRALBCREAT n/a 11/30/2015    A/P: Diabetes currently uncontrolled with a most recent A1c of 8.9% on 04/01/24, which is down from 9.9% on 11/30/23. Patient is able to verbalize appropriate  hypoglycemia management plan. Reported home fasting BG readings are at goal. 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 Fasting BG remains above goal around 125-180 mg/dL. Will increase dose at this time.  -Increased dose of basal insulin  Lantus  (insulin  glargine)  to 34 units daily. Can increase by 2 units every 2 days for fasting BG >150 mg/dL until 44 units daily is reached.  -Continued Janumet  XR 50/1000 mg BID -Patient educated on purpose, proper use, and potential adverse effects of Janumet .  -Extensively discussed pathophysiology of diabetes, recommended lifestyle interventions, dietary effects on blood sugar control.  -Counseled on s/sx of and management of hypoglycemia.  -Next A1c anticipated 07/2024.   ASCVD risk - secondary prevention in patient with diabetes. Last LDL is 47 mg/dL, at goal of <44 mg/dL. -Continued rosuvastatin  10 mg daily.   Patient verbalized understanding of treatment plan. Total time patient counseling 30 minutes.  Follow-up:  Pharmacist on 08/17/24 PCP clinic visit on 07/04/24  Peyton CHARLENA Ferries, PharmD Clinical Pharmacist [facility] [phone]   "

## 2024-08-22 ENCOUNTER — Other Ambulatory Visit: Payer: Self-pay | Admitting: Family Medicine

## 2024-08-22 ENCOUNTER — Telehealth: Payer: Self-pay | Admitting: Family Medicine

## 2024-08-22 DIAGNOSIS — E1140 Type 2 diabetes mellitus with diabetic neuropathy, unspecified: Secondary | ICD-10-CM

## 2024-08-22 MED ORDER — SAXAGLIPTIN HCL 5 MG PO TABS: 5.0000 mg | ORAL_TABLET | Freq: Every day | ORAL | 1 refills | Status: AC

## 2024-08-22 MED ORDER — METFORMIN HCL 1000 MG PO TABS: 1000.0000 mg | ORAL_TABLET | Freq: Two times a day (BID) | ORAL | 1 refills | Status: AC

## 2024-08-22 NOTE — Telephone Encounter (Signed)
 I think he is talking about the Janumet . Insurance prefers Onglyza and metformin  hcl, which are both tier 1 on his formulary. I've sent prescriptions to [facility]. Let me know if trouble getting them filled.

## 2024-08-22 NOTE — Telephone Encounter (Unsigned)
 Copied from CRM [id number]. Topic: Clinical - Prescription Issue >> Aug 22, 2024  2:21 PM Victoria B wrote: Reason for CRM: patient called states is being told metformin  is 700.00 he can't afford

## 2024-08-23 NOTE — Telephone Encounter (Signed)
 Called and advised patient of medication change, advised him to let us  know if any further issues arise. Patient verbalized understanding

## 2024-11-02 ENCOUNTER — Ambulatory Visit: Admitting: Family Medicine
